# Patient Record
Sex: Male | Born: 1990 | Race: Black or African American | Hispanic: No | Marital: Single | Smoking: Never smoker
Health system: Southern US, Community
[De-identification: ages and names within clinical notes are randomized; demographics above are authoritative.]

## PROBLEM LIST (undated history)

## (undated) DIAGNOSIS — D571 Sickle-cell disease without crisis: Secondary | ICD-10-CM

## (undated) DIAGNOSIS — D649 Anemia, unspecified: Secondary | ICD-10-CM

## (undated) DIAGNOSIS — R519 Headache, unspecified: Secondary | ICD-10-CM

## (undated) HISTORY — DX: Anemia, unspecified: D64.9

## (undated) HISTORY — DX: Headache, unspecified: R51.9

## (undated) HISTORY — PX: APPENDECTOMY: SHX54

---
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
## 2011-09-19 ENCOUNTER — Encounter (HOSPITAL_COMMUNITY): Payer: Self-pay | Admitting: *Deleted

## 2011-09-19 ENCOUNTER — Emergency Department (HOSPITAL_COMMUNITY)
Admission: EM | Admit: 2011-09-19 | Discharge: 2011-09-19 | Disposition: A | Discharge status: Home / Self Care | Payer: Medicaid Other | Attending: Emergency Medicine | Admitting: Emergency Medicine

## 2011-09-19 ENCOUNTER — Emergency Department (HOSPITAL_COMMUNITY): Payer: Medicaid Other

## 2011-09-19 DIAGNOSIS — R5381 Other malaise: Secondary | ICD-10-CM | POA: Insufficient documentation

## 2011-09-19 DIAGNOSIS — D696 Thrombocytopenia, unspecified: Secondary | ICD-10-CM

## 2011-09-19 DIAGNOSIS — R55 Syncope and collapse: Secondary | ICD-10-CM

## 2011-09-19 DIAGNOSIS — R5383 Other fatigue: Secondary | ICD-10-CM | POA: Insufficient documentation

## 2011-09-19 DIAGNOSIS — I951 Orthostatic hypotension: Secondary | ICD-10-CM

## 2011-09-19 HISTORY — DX: Sickle-cell disease without crisis: D57.1

## 2011-09-19 LAB — CARDIAC PANEL(CRET KIN+CKTOT+MB+TROPI): Total CK: 32 U/L (ref 7–232)

## 2011-09-19 LAB — CBC WITH DIFFERENTIAL/PLATELET
Basophils Absolute: 0 10*3/uL (ref 0.0–0.1)
Eosinophils Absolute: 0.1 10*3/uL (ref 0.0–0.7)
Eosinophils Relative: 1 % (ref 0–5)
HCT: 30 % — ABNORMAL LOW (ref 39.0–52.0)
Lymphocytes Relative: 11 % — ABNORMAL LOW (ref 12–46)
MCH: 25.9 pg — ABNORMAL LOW (ref 26.0–34.0)
MCV: 73.3 fL — ABNORMAL LOW (ref 78.0–100.0)
Monocytes Absolute: 0.4 10*3/uL (ref 0.1–1.0)
Platelets: 80 10*3/uL — ABNORMAL LOW (ref 150–400)
RDW: 15 % (ref 11.5–15.5)
WBC: 6.4 10*3/uL (ref 4.0–10.5)

## 2011-09-19 LAB — BASIC METABOLIC PANEL
CO2: 24 mEq/L (ref 19–32)
Calcium: 8.3 mg/dL — ABNORMAL LOW (ref 8.4–10.5)
Creatinine, Ser: 0.83 mg/dL (ref 0.50–1.35)
GFR calc non Af Amer: >90 mL/min (ref >90)
Glucose, Bld: 120 mg/dL — ABNORMAL HIGH (ref 70–99)

## 2011-09-19 LAB — HEPATIC FUNCTION PANEL
ALT: 6 U/L (ref 0–53)
AST: 15 U/L (ref 0–37)
Alkaline Phosphatase: 50 U/L (ref 39–117)
Total Protein: 6.1 g/dL (ref 6.0–8.3)

## 2011-09-19 LAB — D-DIMER, QUANTITATIVE: D-Dimer, Quant: 0.33 ug/mL-FEU (ref 0.00–0.48)

## 2011-09-19 MED ORDER — SODIUM CHLORIDE 0.9 % IV BOLUS (SEPSIS)
1000.0000 mL | Freq: Once | INTRAVENOUS | Status: AC
  Administered 2011-09-19: 1000 mL via INTRAVENOUS

## 2011-09-19 NOTE — ED Notes (Signed)
Pt unable to void at this time urinal at bedside pt is aware of needed void

## 2011-09-19 NOTE — ED Provider Notes (Signed)
History     CSN: [id number]  Arrival date & time 09/19/11  0105   First MD Initiated Contact with Patient 09/19/11 0157      Chief Complaint  Patient presents with  . Loss of Consciousness    (Consider location/radiation/quality/duration/timing/severity/associated sxs/prior treatment) HPI Comments: Patient presents by EMS after sycopal episode while [place]. Patietn states he felt lighteaded, dizzy, nauseated before passing out.  Denies chest pain, SOB.  States he thinks he is dehydrated as he didn't drink much today.  Denies alcohol or drug use.  Hypotensive to 60s on EMS arrival.  Denies vision change, focal weakness, numbness, tingling.  The history is provided by the patient.    Past Medical History  Diagnosis Date  . Sickle cell anemia     History reviewed. No pertinent past surgical history.  No family history on file.  History  Substance Use Topics  . Smoking status: Never Smoker   . Smokeless tobacco: Not on file  . Alcohol Use: No      Review of Systems  Constitutional: Positive for fatigue. Negative for fever, activity change and appetite change.  HENT: Negative for congestion and rhinorrhea.   Eyes: Negative for visual disturbance.  Respiratory: Negative for cough and shortness of breath.   Cardiovascular: Positive for syncope. Negative for chest pain.  Gastrointestinal: Positive for nausea. Negative for vomiting and abdominal pain.  Genitourinary: Negative for dysuria and hematuria.  Musculoskeletal: Negative for back pain.  Skin: Negative for rash.  Neurological: Positive for dizziness, syncope and light-headedness.    Allergies  Dilaudid  Home Medications  No current outpatient prescriptions on file.  BP 107/61  Pulse 77  Temp 98.8 F (37.1 C) (Oral)  Resp 18  SpO2 100%  Physical Exam  Constitutional: He is oriented to person, place, and time. He appears well-developed and well-nourished. No distress.  HENT:    Head: Normocephalic and atraumatic.  Mouth/Throat: Oropharynx is clear and moist. No oropharyngeal exudate.  Eyes: Conjunctivae are normal. Pupils are equal, round, and reactive to light.  Neck: Normal range of motion. Neck supple.       No C spine pain, stepoff, deformity  Cardiovascular: Normal rate, regular rhythm and normal heart sounds.   No murmur heard. Pulmonary/Chest: Effort normal and breath sounds normal. No respiratory distress.  Abdominal: Soft. There is no tenderness. There is no rebound and no guarding.  Musculoskeletal: Normal range of motion. He exhibits no edema and no tenderness.  Neurological: He is alert and oriented to person, place, and time. No cranial nerve deficit.       5/5 strength throughout, CN 3-12 intact, no ataxia.  Skin: Skin is warm.    ED Course  Procedures (including critical care time)  Labs Reviewed  CBC WITH DIFFERENTIAL - Abnormal; Notable for the following:    RBC 4.09 (*)     Hemoglobin 10.6 (*)     HCT 30.0 (*)     MCV 73.3 (*)     MCH 25.9 (*)     Neutrophils Relative 80 (*)     Lymphocytes Relative 11 (*)     All other components within normal limits  BASIC METABOLIC PANEL  CARDIAC PANEL(CRET KIN+CKTOT+MB+TROPI)  D-DIMER, QUANTITATIVE  URINALYSIS, ROUTINE W REFLEX MICROSCOPIC  URINE RAPID DRUG SCREEN (HOSP PERFORMED)   Ct Head Wo Contrast  09/19/2011  *RADIOLOGY REPORT*  Clinical Data: Syncope, head trauma.  CT HEAD WITHOUT CONTRAST  Technique:  Contiguous axial images were obtained  from the base of the skull through the vertex without contrast.  Comparison: None.  Findings: There is no evidence for acute hemorrhage, hydrocephalus, mass lesion, or abnormal extra-axial fluid collection.  No definite CT evidence for acute infarction.  The visualized paranasal sinuses and mastoid air cells are predominately clear.  No displaced calvarial fracture.  IMPRESSION: No acute intracranial abnormality.  Original Report Authenticated By: Waneta Martins, M.D.     No diagnosis found.    MDM  Sycnopal episode while standing outside hitting back of head. Hypotensive on EMS arrival. Admits to poor by mouth intake today. No focal neurological deficits.  Hemoglobin 10.6 from 12. Platelets of 80. Previously 117. Patient refuses rectal exam. Denies any blood in the stool, bleeding, rashes. Denies any alcohol use.  Questionable history of sickle cell anemia. Orthostatic vitals positive. D-dimer negative. Tolerating PO in the ED, ambulatory without assistance. Patient does state he has a history of sickle cell anemia. He sees a Acupuncturist in [place]. He denies having a previous platelet problems. Denies any bleeding or bruising. Advised to followup regarding his thrombocytopenia.  Given hematology referral here as well.    Date: 09/19/2011  Rate: 60  Rhythm: sinus arrhythmia  QRS Axis: normal  Intervals: normal  ST/T Wave abnormalities: normal  Conduction Disutrbances:none  Narrative Interpretation: No Brugada, no prolonged QT  Old EKG Reviewed: none available    Glynn Octave, MD 09/19/11 [phone]

## 2011-09-19 NOTE — ED Notes (Signed)
The pt still has not given a urine spec

## 2011-09-19 NOTE — ED Notes (Signed)
[place]  [place] the pt fainted and struck the back of his head.  The ems said his initial bp was 60 pal.  Iv started and nss infused.  Pt alert on arrival and he does not klnow what his usual bp is normally.  Iv per gems

## 2011-09-19 NOTE — ED Notes (Signed)
i asked the pt for a urine pt did not answer

## 2011-09-19 NOTE — ED Notes (Signed)
1000cc nss infused. 1000 cc nss added to iv.  The pt has no complaints

## 2011-09-19 NOTE — ED Notes (Signed)
The pt is alert and oriented not saying very much.  He has had similar episodes in the past he thinks are due to his sickle cell

## 2011-09-19 NOTE — ED Notes (Signed)
Pt reminded of the  Urine  Spec we needed

## 2011-10-14 DIAGNOSIS — R161 Splenomegaly, not elsewhere classified: Secondary | ICD-10-CM | POA: Insufficient documentation

## 2011-10-14 DIAGNOSIS — D564 Hereditary persistence of fetal hemoglobin [HPFH]: Secondary | ICD-10-CM | POA: Insufficient documentation

## 2011-10-14 DIAGNOSIS — G8929 Other chronic pain: Secondary | ICD-10-CM | POA: Insufficient documentation

## 2011-10-14 DIAGNOSIS — K8020 Calculus of gallbladder without cholecystitis without obstruction: Secondary | ICD-10-CM | POA: Insufficient documentation

## 2011-10-14 DIAGNOSIS — D571 Sickle-cell disease without crisis: Secondary | ICD-10-CM | POA: Insufficient documentation

## 2011-11-02 DIAGNOSIS — E559 Vitamin D deficiency, unspecified: Secondary | ICD-10-CM | POA: Insufficient documentation

## 2011-11-02 HISTORY — DX: Vitamin D deficiency, unspecified: E55.9

## 2012-03-25 ENCOUNTER — Emergency Department (HOSPITAL_COMMUNITY)
Admission: EM | Admit: 2012-03-25 | Discharge: 2012-03-25 | Disposition: A | Discharge status: Home / Self Care | Payer: Medicaid Other | Attending: Emergency Medicine | Admitting: Emergency Medicine

## 2012-03-25 ENCOUNTER — Encounter (HOSPITAL_COMMUNITY): Payer: Self-pay | Admitting: Emergency Medicine

## 2012-03-25 DIAGNOSIS — M545 Low back pain, unspecified: Secondary | ICD-10-CM | POA: Insufficient documentation

## 2012-03-25 DIAGNOSIS — D5700 Hb-SS disease with crisis, unspecified: Secondary | ICD-10-CM | POA: Insufficient documentation

## 2012-03-25 DIAGNOSIS — IMO0001 Reserved for inherently not codable concepts without codable children: Secondary | ICD-10-CM | POA: Insufficient documentation

## 2012-03-25 DIAGNOSIS — Z79899 Other long term (current) drug therapy: Secondary | ICD-10-CM | POA: Insufficient documentation

## 2012-03-25 LAB — RAPID URINE DRUG SCREEN, HOSP PERFORMED
Opiates: NOT DETECTED
Tetrahydrocannabinol: POSITIVE — AB

## 2012-03-25 LAB — CBC WITH DIFFERENTIAL/PLATELET
Basophils Relative: 0 % (ref 0–1)
Eosinophils Absolute: 0.1 10*3/uL (ref 0.0–0.7)
Eosinophils Relative: 2 % (ref 0–5)
HCT: 36.2 % — ABNORMAL LOW (ref 39.0–52.0)
Hemoglobin: 12.7 g/dL — ABNORMAL LOW (ref 13.0–17.0)
MCH: 25.7 pg — ABNORMAL LOW (ref 26.0–34.0)
MCHC: 35.1 g/dL (ref 30.0–36.0)
MCV: 73.1 fL — ABNORMAL LOW (ref 78.0–100.0)
Monocytes Absolute: 0.5 10*3/uL (ref 0.1–1.0)
Monocytes Relative: 9 % (ref 3–12)

## 2012-03-25 LAB — COMPREHENSIVE METABOLIC PANEL
Alkaline Phosphatase: 60 U/L (ref 39–117)
BUN: 4 mg/dL — ABNORMAL LOW (ref 6–23)
Calcium: 9 mg/dL (ref 8.4–10.5)
Creatinine, Ser: 0.88 mg/dL (ref 0.50–1.35)
GFR calc Af Amer: >90 mL/min (ref >90)
Glucose, Bld: 148 mg/dL — ABNORMAL HIGH (ref 70–99)
Potassium: 3.4 mEq/L — ABNORMAL LOW (ref 3.5–5.1)
Total Protein: 7.1 g/dL (ref 6.0–8.3)

## 2012-03-25 LAB — RETICULOCYTES: Retic Ct Pct: 4.1 % — ABNORMAL HIGH (ref 0.4–3.1)

## 2012-03-25 MED ORDER — MORPHINE SULFATE 4 MG/ML IJ SOLN
8.0000 mg | Freq: Once | INTRAMUSCULAR | Status: AC
  Administered 2012-03-25: 8 mg via INTRAVENOUS
  Filled 2012-03-25: qty 2

## 2012-03-25 MED ORDER — KETOROLAC TROMETHAMINE 30 MG/ML IJ SOLN
30.0000 mg | Freq: Once | INTRAMUSCULAR | Status: AC
  Administered 2012-03-25: 30 mg via INTRAVENOUS
  Filled 2012-03-25: qty 1

## 2012-03-25 MED ORDER — SODIUM CHLORIDE 0.9 % IV BOLUS (SEPSIS)
1000.0000 mL | Freq: Once | INTRAVENOUS | Status: AC
  Administered 2012-03-25: 1000 mL via INTRAVENOUS

## 2012-03-25 MED ORDER — ONDANSETRON HCL 4 MG/2ML IJ SOLN
4.0000 mg | Freq: Once | INTRAMUSCULAR | Status: AC
  Administered 2012-03-25: 4 mg via INTRAVENOUS
  Filled 2012-03-25: qty 2

## 2012-03-25 NOTE — ED Provider Notes (Signed)
History     CSN: [id number]  Arrival date & time 03/25/12  1657   First MD Initiated Contact with Patient 03/25/12 1708      Chief Complaint  Patient presents with  . Sickle Cell Pain Crisis    (Consider location/radiation/quality/duration/timing/severity/associated sxs/prior treatment) HPI Comments: Patient complains of sickle cell crisis in his lower back and legs it started several hours ago. He took his normal oxycodone without relief. States pain is similar to previous episodes of sickle cell pain. He fell at [facility]. Denies any fevers, chills, cough, congestion, chest pain or shortness of breath. Denies a difficulty breathing or swallowing. No numbness, tingling, weakness, bowel or bladder incontinence.  The history is provided by the patient.    Past Medical History  Diagnosis Date  . Sickle cell anemia     History reviewed. No pertinent past surgical history.  No family history on file.  History  Substance Use Topics  . Smoking status: Never Smoker   . Smokeless tobacco: Not on file  . Alcohol Use: No      Review of Systems  Constitutional: Negative for fever, activity change and appetite change.  HENT: Negative for congestion and rhinorrhea.   Respiratory: Negative for cough, chest tightness and shortness of breath.   Cardiovascular: Negative for chest pain.  Gastrointestinal: Negative for nausea, vomiting and abdominal pain.  Genitourinary: Negative for dysuria and hematuria.  Musculoskeletal: Positive for myalgias, back pain and arthralgias.  Skin: Negative for rash.  Neurological: Negative for dizziness, weakness and headaches.    Allergies  Dilaudid  Home Medications   Current Outpatient Rx  Name  Route  Sig  Dispense  Refill  . OXYCODONE HCL 15 MG PO TABS   Oral   Take 15 mg by mouth every 4 (four) hours as needed. For pain           BP 113/65  Pulse 51  Temp 97.5 F (36.4 C) (Oral)  Resp 16  SpO2 99%  Physical Exam   Constitutional: He is oriented to person, place, and time. He appears well-developed and well-nourished. No distress.       Smells of marijuana  HENT:  Head: Normocephalic and atraumatic.  Mouth/Throat: Oropharynx is clear and moist. No oropharyngeal exudate.  Eyes: Conjunctivae normal and EOM are normal. Pupils are equal, round, and reactive to light.  Neck: Normal range of motion. Neck supple.  Cardiovascular: Normal rate, regular rhythm and normal heart sounds.   No murmur heard. Pulmonary/Chest: Effort normal and breath sounds normal. No respiratory distress.  Abdominal: Soft. There is no tenderness. There is no rebound and no guarding.  Musculoskeletal: Normal range of motion. He exhibits no edema and no tenderness.       Paraspinal lumbar pain bilatrally  5/5 strength in bilateral lower extremities. Ankle plantar and dorsiflexion intact. Great toe extension intact bilaterally. +2 DP and PT pulses. +2 patellar reflexes bilaterally. Normal gait.   Neurological: He is alert and oriented to person, place, and time. No cranial nerve deficit. He exhibits normal muscle tone. Coordination normal.  Skin: Skin is warm.    ED Course  Procedures (including critical care time)  Labs Reviewed  CBC WITH DIFFERENTIAL - Abnormal; Notable for the following:    Hemoglobin 12.7 (*)     HCT 36.2 (*)     MCV 73.1 (*)     MCH 25.7 (*)     Platelets 113 (*)     All other components within normal limits  COMPREHENSIVE  METABOLIC PANEL - Abnormal; Notable for the following:    Potassium 3.4 (*)     Glucose, Bld 148 (*)     BUN 4 (*)     Total Bilirubin 1.7 (*)     All other components within normal limits  RETICULOCYTES - Abnormal; Notable for the following:    Retic Ct Pct 4.1 (*)     Retic Count, Manual 203.0 (*)     All other components within normal limits  URINE RAPID DRUG SCREEN (HOSP PERFORMED) - Abnormal; Notable for the following:    Tetrahydrocannabinol POSITIVE (*)     All other  components within normal limits   No results found.   1. Sickle cell pain crisis       MDM  Typical sickle cell pain crisis in back and legs. No fevers or vomiting. No chest pain or shortness of breath.  Reviewed [facility] records.    Hemoglobin stable. Thrombocytopenia improved. Reticulocyte response adequate.  Pain improved with one dose of morphine. Patient requesting discharge.    Glynn Octave, MD 03/25/12 1950

## 2012-03-25 NOTE — ED Notes (Signed)
Pt. Stated, I'm having a sickle cell crisis in my lower back and legs.

## 2012-03-25 NOTE — ED Notes (Signed)
Pt states understanding of discharge instructions 

## 2012-05-05 ENCOUNTER — Encounter (HOSPITAL_COMMUNITY): Payer: Self-pay | Admitting: Emergency Medicine

## 2012-05-05 ENCOUNTER — Emergency Department (HOSPITAL_COMMUNITY)
Admission: EM | Admit: 2012-05-05 | Discharge: 2012-05-05 | Payer: Medicaid Other | Attending: Emergency Medicine | Admitting: Emergency Medicine

## 2012-05-05 DIAGNOSIS — M542 Cervicalgia: Secondary | ICD-10-CM | POA: Insufficient documentation

## 2012-05-05 DIAGNOSIS — M549 Dorsalgia, unspecified: Secondary | ICD-10-CM | POA: Insufficient documentation

## 2012-05-05 DIAGNOSIS — D5700 Hb-SS disease with crisis, unspecified: Secondary | ICD-10-CM

## 2012-05-05 DIAGNOSIS — IMO0001 Reserved for inherently not codable concepts without codable children: Secondary | ICD-10-CM | POA: Insufficient documentation

## 2012-05-05 LAB — CBC WITH DIFFERENTIAL/PLATELET
Basophils Relative: 0 % (ref 0–1)
Eosinophils Relative: 3 % (ref 0–5)
Hemoglobin: 10.2 g/dL — ABNORMAL LOW (ref 13.0–17.0)
Lymphocytes Relative: 20 % (ref 12–46)
MCH: 25.1 pg — ABNORMAL LOW (ref 26.0–34.0)
Monocytes Absolute: 1.8 10*3/uL — ABNORMAL HIGH (ref 0.1–1.0)
Neutrophils Relative %: 62 % (ref 43–77)
RBC: 4.06 MIL/uL — ABNORMAL LOW (ref 4.22–5.81)

## 2012-05-05 LAB — COMPREHENSIVE METABOLIC PANEL
ALT: 35 U/L (ref 0–53)
AST: 47 U/L — ABNORMAL HIGH (ref 0–37)
CO2: 28 mEq/L (ref 19–32)
Chloride: 99 mEq/L (ref 96–112)
GFR calc non Af Amer: >90 mL/min (ref >90)
Potassium: 4.6 mEq/L (ref 3.5–5.1)
Sodium: 137 mEq/L (ref 135–145)
Total Bilirubin: 2.6 mg/dL — ABNORMAL HIGH (ref 0.3–1.2)

## 2012-05-05 MED ORDER — SODIUM CHLORIDE 0.9 % IV BOLUS (SEPSIS)
1000.0000 mL | Freq: Once | INTRAVENOUS | Status: AC
  Administered 2012-05-05: 1000 mL via INTRAVENOUS

## 2012-05-05 MED ORDER — KETOROLAC TROMETHAMINE 30 MG/ML IJ SOLN
30.0000 mg | Freq: Once | INTRAMUSCULAR | Status: AC
  Administered 2012-05-05: 30 mg via INTRAVENOUS
  Filled 2012-05-05: qty 1

## 2012-05-05 MED ORDER — MORPHINE SULFATE 4 MG/ML IJ SOLN
4.0000 mg | Freq: Once | INTRAMUSCULAR | Status: AC
  Administered 2012-05-05: 4 mg via INTRAVENOUS
  Filled 2012-05-05: qty 1

## 2012-05-05 MED ORDER — OXYCODONE-ACETAMINOPHEN 5-325 MG PO TABS: 1.0000 | ORAL_TABLET | ORAL | Status: DC | PRN

## 2012-05-05 MED ORDER — IBUPROFEN 600 MG PO TABS: 600.0000 mg | ORAL_TABLET | Freq: Four times a day (QID) | ORAL | Status: DC | PRN

## 2012-05-05 MED ORDER — OXYCODONE-ACETAMINOPHEN 5-325 MG PO TABS
2.0000 | ORAL_TABLET | Freq: Once | ORAL | Status: AC
  Administered 2012-05-05: 2 via ORAL
  Filled 2012-05-05: qty 2

## 2012-05-05 NOTE — ED Notes (Signed)
Patient discharged to jail.  Waiting in room for another unit to pick them up.  Detention officer at bedside wanting to remain in room with patient until unit comes.

## 2012-05-05 NOTE — ED Notes (Signed)
Pt presenting to ed with c/o sickle cell crisis with neck and back pain onset x 4 days

## 2012-05-05 NOTE — ED Provider Notes (Signed)
History     CSN: [id number]  Arrival date & time 05/05/12  1633   First MD Initiated Contact with Patient 05/05/12 1634      Chief Complaint  Patient presents with  . Sickle Cell Pain Crisis    (Consider location/radiation/quality/duration/timing/severity/associated sxs/prior treatment) HPI Pt with 4 days of neck and back pain similar to previous sickle cell pain crises. Pt is currently incarcerated and unable to take normal pain medicine, oxycodone, for pain. No fever chills, SOB, cough, CP abd pain, N/V, focal weakness. No trauma.  Past Medical History  Diagnosis Date  . Sickle cell anemia     Past Surgical History  Procedure Laterality Date  . Appendectomy      No family history on file.  History  Substance Use Topics  . Smoking status: Never Smoker   . Smokeless tobacco: Not on file  . Alcohol Use: No      Review of Systems  Constitutional: Negative for fever and chills.  HENT: Positive for neck pain. Negative for neck stiffness.   Respiratory: Negative for cough and shortness of breath.   Cardiovascular: Negative for chest pain.  Gastrointestinal: Negative for nausea, vomiting and abdominal pain.  Genitourinary: Negative for dysuria.  Musculoskeletal: Positive for myalgias and back pain.  Skin: Negative for rash and wound.  Neurological: Negative for dizziness, weakness, numbness and headaches.  All other systems reviewed and are negative.    Allergies  Dilaudid and Cefotaxime  Home Medications   Current Outpatient Rx  Name  Route  Sig  Dispense  Refill  . ibuprofen (ADVIL,MOTRIN) 600 MG tablet   Oral   Take 1 tablet (600 mg total) by mouth every 6 (six) hours as needed for pain.   30 tablet   0   . oxyCODONE-acetaminophen (PERCOCET/ROXICET) 5-325 MG per tablet   Oral   Take 1 tablet by mouth every 4 (four) hours as needed for pain.   15 tablet   0     BP 115/69  Pulse 66  Temp(Src) 98.9 F (37.2 C) (Oral)  Resp 16  SpO2  100%  Physical Exam  Nursing note and vitals reviewed. Constitutional: He is oriented to person, place, and time. He appears well-developed and well-nourished. No distress.  HENT:  Head: Normocephalic and atraumatic.  Mouth/Throat: Oropharynx is clear and moist.  Eyes: EOM are normal. Pupils are equal, round, and reactive to light.  Neck: Normal range of motion. Neck supple.  Cardiovascular: Normal rate and regular rhythm.   Pulmonary/Chest: Effort normal and breath sounds normal. No respiratory distress. He has no wheezes. He has no rales.  Abdominal: Soft. Bowel sounds are normal. He exhibits no distension and no mass. There is no tenderness. There is no rebound and no guarding.  Musculoskeletal: Normal range of motion. He exhibits tenderness (Diffuse posterior cervical, thoracic and lumbar tenderness without focality. No evidence of trauma. No stepoffs ). He exhibits no edema.  Neurological: He is alert and oriented to person, place, and time.  Moves all ext without deficit. Sensation intact  Skin: Skin is warm and dry. No rash noted. No erythema.  Psychiatric: He has a normal mood and affect. His behavior is normal.    ED Course  Procedures (including critical care time)  Labs Reviewed  CBC WITH DIFFERENTIAL - Abnormal; Notable for the following:    WBC 12.0 (*)    RBC 4.06 (*)    Hemoglobin 10.2 (*)    HCT 29.5 (*)    MCV 72.7 (*)  MCH 25.1 (*)    RDW 15.7 (*)    Monocytes Relative 15 (*)    Monocytes Absolute 1.8 (*)    All other components within normal limits  COMPREHENSIVE METABOLIC PANEL - Abnormal; Notable for the following:    AST 47 (*)    Total Bilirubin 2.6 (*)    All other components within normal limits  RETICULOCYTES - Abnormal; Notable for the following:    Retic Ct Pct 10.2 (*)    RBC. 4.06 (*)    Retic Count, Manual 414.1 (*)    All other components within normal limits   No results found.   1. Sickle cell crisis       MDM  Pt states he is  feeling better after meds. No obvious distress. Will d/c into police custody.         Loren Racer, MD 05/05/12 2059

## 2012-05-06 ENCOUNTER — Emergency Department (HOSPITAL_COMMUNITY): Payer: Medicaid Other

## 2012-05-06 ENCOUNTER — Encounter (HOSPITAL_COMMUNITY): Payer: Self-pay | Admitting: Emergency Medicine

## 2012-05-06 ENCOUNTER — Inpatient Hospital Stay (HOSPITAL_COMMUNITY)
Admission: EM | Admit: 2012-05-06 | Discharge: 2012-05-12 | DRG: 811 | Disposition: A | Discharge status: Home / Self Care | Payer: Medicaid Other | Attending: Internal Medicine | Admitting: Internal Medicine

## 2012-05-06 DIAGNOSIS — D696 Thrombocytopenia, unspecified: Secondary | ICD-10-CM | POA: Diagnosis present

## 2012-05-06 DIAGNOSIS — D72829 Elevated white blood cell count, unspecified: Secondary | ICD-10-CM

## 2012-05-06 DIAGNOSIS — Z888 Allergy status to other drugs, medicaments and biological substances status: Secondary | ICD-10-CM

## 2012-05-06 DIAGNOSIS — Z9089 Acquired absence of other organs: Secondary | ICD-10-CM

## 2012-05-06 DIAGNOSIS — R509 Fever, unspecified: Secondary | ICD-10-CM

## 2012-05-06 DIAGNOSIS — Z791 Long term (current) use of non-steroidal anti-inflammatories (NSAID): Secondary | ICD-10-CM

## 2012-05-06 DIAGNOSIS — D5700 Hb-SS disease with crisis, unspecified: Principal | ICD-10-CM | POA: Diagnosis present

## 2012-05-06 DIAGNOSIS — Z885 Allergy status to narcotic agent status: Secondary | ICD-10-CM

## 2012-05-06 DIAGNOSIS — J189 Pneumonia, unspecified organism: Secondary | ICD-10-CM | POA: Diagnosis present

## 2012-05-06 DIAGNOSIS — Z79899 Other long term (current) drug therapy: Secondary | ICD-10-CM

## 2012-05-06 DIAGNOSIS — R079 Chest pain, unspecified: Secondary | ICD-10-CM | POA: Diagnosis present

## 2012-05-06 LAB — POCT I-STAT, CHEM 8
BUN: 6 mg/dL (ref 6–23)
Calcium, Ion: 1.13 mmol/L (ref 1.12–1.23)
Chloride: 106 mEq/L (ref 96–112)
Potassium: 4.3 mEq/L (ref 3.5–5.1)

## 2012-05-06 LAB — CBC WITH DIFFERENTIAL/PLATELET
Basophils Absolute: 0.1 10*3/uL (ref 0.0–0.1)
HCT: 29.2 % — ABNORMAL LOW (ref 39.0–52.0)
Hemoglobin: 10.1 g/dL — ABNORMAL LOW (ref 13.0–17.0)
Lymphocytes Relative: 8 % — ABNORMAL LOW (ref 12–46)
Monocytes Absolute: 2.6 10*3/uL — ABNORMAL HIGH (ref 0.1–1.0)
Monocytes Relative: 14 % — ABNORMAL HIGH (ref 3–12)
Neutro Abs: 14.9 10*3/uL — ABNORMAL HIGH (ref 1.7–7.7)
RDW: 17.3 % — ABNORMAL HIGH (ref 11.5–15.5)
WBC: 19.1 10*3/uL — ABNORMAL HIGH (ref 4.0–10.5)

## 2012-05-06 LAB — POCT I-STAT TROPONIN I: Troponin i, poc: 0 ng/mL (ref 0.00–0.08)

## 2012-05-06 LAB — RETICULOCYTES: Retic Count, Absolute: 478.4 10*3/uL — ABNORMAL HIGH (ref 19.0–186.0)

## 2012-05-06 MED ORDER — ONDANSETRON HCL 4 MG/2ML IJ SOLN
4.0000 mg | Freq: Once | INTRAMUSCULAR | Status: AC
  Administered 2012-05-06: 4 mg via INTRAVENOUS
  Filled 2012-05-06: qty 2

## 2012-05-06 MED ORDER — MORPHINE SULFATE 4 MG/ML IJ SOLN
6.0000 mg | Freq: Once | INTRAMUSCULAR | Status: AC
  Administered 2012-05-06: 6 mg via INTRAVENOUS
  Filled 2012-05-06 (×2): qty 1

## 2012-05-06 MED ORDER — LIDOCAINE HCL 4 % EX SOLN
Freq: Once | CUTANEOUS | Status: DC
  Filled 2012-05-06: qty 50

## 2012-05-06 MED ORDER — MORPHINE SULFATE 4 MG/ML IJ SOLN
4.0000 mg | INTRAMUSCULAR | Status: DC | PRN
  Administered 2012-05-06 – 2012-05-07 (×5): 4 mg via INTRAVENOUS
  Filled 2012-05-06: qty 1

## 2012-05-06 MED ORDER — SODIUM CHLORIDE 0.9 % IV BOLUS (SEPSIS)
1000.0000 mL | Freq: Once | INTRAVENOUS | Status: AC
  Administered 2012-05-06: 1000 mL via INTRAVENOUS

## 2012-05-06 MED ORDER — MORPHINE SULFATE 4 MG/ML IJ SOLN
INTRAMUSCULAR | Status: AC
  Filled 2012-05-06: qty 1

## 2012-05-06 MED ORDER — MORPHINE SULFATE 4 MG/ML IJ SOLN
8.0000 mg | Freq: Once | INTRAMUSCULAR | Status: AC
  Administered 2012-05-06: 8 mg via INTRAVENOUS
  Filled 2012-05-06: qty 2

## 2012-05-06 NOTE — ED Provider Notes (Signed)
History     CSN: [id number]  Arrival date & time 05/06/12  1658   First MD Initiated Contact with Patient 05/06/12 1822      Chief Complaint  Patient presents with  . Chest Pain  . Shortness of Breath    (Consider location/radiation/quality/duration/timing/severity/associated sxs/prior treatment) HPI This 22 year old male has sickle cell disease and chronic pain chronic narcotic usage has been in jail for the last week and unable to take his narcotic pain medicine, he was taken to [facility] yesterday for a sickle cell pain crisis improved and taken back to jail, he was released from jail today, did not fill his prescription for pain medicine from [facility] long hospital, and he came to the [facility]  to the emergency department for pain relief, he has several days of typical severe constant 24/7 sickle cell back pain flareup pain as well as today all day long chest pain over 8 hours like he has had in the past with prior sickle cell exacerbations, he also has some baseline severe neck pain, he has no cough no shortness of breath no fever no confusion no abdominal pain but he did vomit once today, he does have typical right leg pain like he has had with prior sickle cell exacerbations as well, he is no new focal weakness or numbness and states he has been drinking fluids well but still feels a bit dehydrated and requests IV fluids and morphine and nausea medicine because that helps typically for him when he has to go to the emergency department at [facility] where his doctors are. Past Medical History  Diagnosis Date  . Sickle cell anemia   . Sickle cell anemia     Past Surgical History  Procedure Laterality Date  . Appendectomy      Family History  Problem Relation Age of Onset  . Lung cancer Mother     History  Substance Use Topics  . Smoking status: Never Smoker   . Smokeless tobacco: Not on file  . Alcohol Use: No      Review of Systems 10 Systems  reviewed and are negative for acute change except as noted in the HPI. Allergies  Dilaudid; Hydromorphone; and Cefotaxime  Home Medications   No current outpatient prescriptions on file.  BP 135/81  Pulse 93  Temp(Src) 98.8 F (37.1 C) (Oral)  Resp 22  Ht 6\' 1"  (1.854 m)  Wt 172 lb 11.2 oz (78.336 kg)  BMI 22.79 kg/m2  SpO2 90%  Physical Exam  Nursing note and vitals reviewed. Constitutional:  Awake, alert, nontoxic appearance.  HENT:  Head: Atraumatic.  Eyes: Right eye exhibits no discharge. Left eye exhibits no discharge.  Neck: Neck supple.  Cardiovascular: Normal rate and regular rhythm.   No murmur heard. Pulmonary/Chest: Effort normal and breath sounds normal. No respiratory distress. He has no wheezes. He has no rales. He exhibits tenderness.  Abdominal: Soft. There is no tenderness. There is no rebound.  Musculoskeletal: He exhibits tenderness. He exhibits no edema.  Baseline ROM, no obvious new focal weakness. Diffusely tender posterior neck and back as well as anterior chest wall like the patient states he has had in the past multiple times with sickle cell exacerbations.  Neurological: He is alert.  Mental status and motor strength appears baseline for patient and situation.  Skin: No rash noted.  Psychiatric: He has a normal mood and affect.    ED Course  Procedures (including critical care time) Patient's pain no better  in the ED he wants admission he requested transfer to [facility] I discussed the case with hematology oncology at [facility] feel the patient can stay at [facility] on observation the patient understands and agrees with this assessment and plan as well I talked to the hospitalist here at [facility] will admit the patient.  Labs Reviewed  RETICULOCYTES - Abnormal; Notable for the following:    Retic Ct Pct 11.9 (*)    RBC. 4.02 (*)    Retic Count, Manual 478.4 (*)    All other components within normal limits  CBC WITH DIFFERENTIAL - Abnormal;  Notable for the following:    WBC 19.1 (*)    RBC 4.02 (*)    Hemoglobin 10.1 (*)    HCT 29.2 (*)    MCV 72.6 (*)    MCH 25.1 (*)    RDW 17.3 (*)    Platelets 136 (*)    Neutrophils Relative 78 (*)    Neutro Abs 14.9 (*)    Lymphocytes Relative 8 (*)    Monocytes Relative 14 (*)    Monocytes Absolute 2.6 (*)    All other components within normal limits  POCT I-STAT, CHEM 8 - Abnormal; Notable for the following:    Glucose, Bld 118 (*)    Hemoglobin 10.9 (*)    HCT 32.0 (*)    All other components within normal limits  MRSA PCR SCREENING  BASIC METABOLIC PANEL  MAGNESIUM  CBC  FERRITIN  POCT I-STAT TROPONIN I   Dg Chest 2 View  05/06/2012  *RADIOLOGY REPORT*  Clinical Data: Shortness of breath and chest pain for 2 days. Nonsmoker.  CHEST - 2 VIEW  Comparison: 04/05/2010  Findings: Heart size is normal.  Lung volumes are low.  There is perihilar peribronchial change.  Bibasilar atelectasis is present. There are no focal consolidations or pleural effusions.  IMPRESSION:  1.  Low lung volumes. 2.  Bronchitic changes.   Original Report Authenticated By: Norva Pavlov, M.D.      1. Sickle cell pain crisis       MDM  The patient appears reasonably stabilized for admission considering the current resources, flow, and capabilities available in the ED at this time, and I doubt any other [facility] requiring further screening and/or treatment in the ED prior to admission.        Hurman Horn, MD 05/07/12 [phone]

## 2012-05-06 NOTE — ED Notes (Signed)
The pt still having pain.  Asking for juice unable to get at present

## 2012-05-06 NOTE — ED Notes (Signed)
Pt reports pain across chest onset yesterday. Pt also reports back pain. Symptoms accompanied by shortness of breath and nausea. Pt seen [facility] yesterday for same.

## 2012-05-06 NOTE — ED Notes (Signed)
Admitting  Doctor in with the pt

## 2012-05-06 NOTE — ED Notes (Signed)
The pt has had chest and pain all over for the past 2 days.  He has sickle cell. He was in jail and was seen at [facility] long ed last pm for the same.  He was released from jail  Today and came here for more pain management.

## 2012-05-06 NOTE — ED Notes (Signed)
The pt received pain med iv

## 2012-05-06 NOTE — H&P (Signed)
PCP: Hematology at [facility] Complaint:  Back and chest pain  HPI: Paul Campos is a 22 y.o. male   has a past medical history of Sickle cell anemia and Sickle cell anemia.   Presented with  1 week hx of back pain and Right leg pain typical for his crisis. He has hx of Sickle cell disease. He is followed by clinic at [facility]. States he takes oxycodone 15 mg as needed. A week ago he was incarcerated and soon there after started to have back pain. He was treated with motrin and Tylenol #3 and yesterday he went to [facility] and had a prescription given for percocet but have not filled it yet. Today he was released from [facility] and went straight to ER. Patient does report transient chest pain that have went away but lasted all day long. CXR unremarkable.  He have had some low grade fevers and some nausea. Patient is being admitted for pain management.   Review of Systems:     Pertinent positives include: Fevers, chills  chest pain, back pain  Constitutional:   No weight loss, night sweats,, fatigue, weight loss  HEENT:  No headaches, Difficulty swallowing,Tooth/dental problems,Sore throat,  No sneezing, itching, ear ache, nasal congestion, post nasal drip,  Cardio-vascular:  No Orthopnea, PND, anasarca, dizziness, palpitations.no Bilateral lower extremity swelling  GI:  No heartburn, indigestion, abdominal pain, nausea, vomiting, diarrhea, change in bowel habits, loss of appetite, melena, blood in stool, hematemesis Resp:  no shortness of breath at rest. No dyspnea on exertion, No excess mucus, no productive cough, No non-productive cough, No coughing up of blood.No change in color of mucus.No wheezing. Skin:  no rash or lesions. No jaundice GU:  no dysuria, change in color of urine, no urgency or frequency. No straining to urinate.  No flank pain.  Musculoskeletal:  No joint pain or no joint swelling. No decreased range of motion. No back pain.  Psych:  No change in mood or affect. No  depression or anxiety. No memory loss.  Neuro: no localizing neurological complaints, no tingling, no weakness, no double vision, no gait abnormality, no slurred speech, no confusion  Otherwise ROS are negative except for above, 10 systems were reviewed  Past Medical History: Past Medical History  Diagnosis Date  . Sickle cell anemia   . Sickle cell anemia    Past Surgical History  Procedure Laterality Date  . Appendectomy       Medications: Prior to Admission medications   Medication Sig Start Date End Date Taking? Authorizing Provider  acetaminophen-codeine (TYLENOL #3) 300-30 MG per tablet Take 1 tablet by mouth every 4 (four) hours as needed for pain.   Yes Historical Provider, MD  ibuprofen (ADVIL,MOTRIN) 200 MG tablet Take 200 mg by mouth every 6 (six) hours as needed for pain.   Yes Historical Provider, MD  oxyCODONE-acetaminophen (PERCOCET/ROXICET) 5-325 MG per tablet Take 1 tablet by mouth every 4 (four) hours as needed for pain. 05/05/12   Loren Racer, MD    Allergies:   Allergies  Allergen Reactions  . Dilaudid (Hydromorphone Hcl) Hives  . Hydromorphone Itching    Morphine okay.  . Cefotaxime Itching and Rash    Hives    Social History:  Ambulatory  independently   Lives at  home   reports that he has never smoked. He does not have any smokeless tobacco history on file. He reports that he does not drink alcohol or use illicit drugs.   Family History:  family history includes Lung cancer in his mother.    Physical Exam: Patient Vitals for the past 24 hrs:  BP Temp Temp src Pulse Resp SpO2  05/06/12 2023 141/85 mmHg - - 83 20 97 %  05/06/12 1937 133/81 mmHg 99.1 F (37.3 C) Oral 80 16 97 %    1. General:  in No Acute distress 2. Psychological: Alert and  Oriented 3. Head/ENT:   Moist   Mucous Membranes                          Head Non traumatic, neck supple                          Normal   Dentition 4. SKIN: normal  Skin turgor,  Skin clean Dry  and intact no rash 5. Heart: Regular rate and rhythm no Murmur, Rub or gallop 6. Lungs: Clear to auscultation bilaterally, no wheezes or crackles   7. Abdomen: Soft, non-tender, Non distended 8. Lower extremities: no clubbing, cyanosis, or edema 9. Neurologically Grossly intact, moving all 4 extremities equally 10. MSK: Normal range of motion  body mass index is unknown because there is no height or weight on file.   Labs on Admission:   Recent Labs  05/05/12 1730 05/06/12 1741  NA 137 141  K 4.6 4.3  CL 99 106  CO2 28  --   GLUCOSE 83 118*  BUN 10 6  CREATININE 0.92 0.70  CALCIUM 9.7  --     Recent Labs  05/05/12 1730  AST 47*  ALT 35  ALKPHOS 69  BILITOT 2.6*  PROT 8.2  ALBUMIN 4.8   No results found for this basename: LIPASE, AMYLASE,  in the last 72 hours  Recent Labs  05/05/12 1730 05/06/12 1741 05/06/12 1820  WBC 12.0*  --  19.1*  NEUTROABS 7.4  --  14.9*  HGB 10.2* 10.9* 10.1*  HCT 29.5* 32.0* 29.2*  MCV 72.7*  --  72.6*  PLT PLATELET CLUMPS NOTED ON SMEAR, COUNT APPEARS DECREASED  --  136*   No results found for this basename: CKTOTAL, CKMB, CKMBINDEX, TROPONINI,  in the last 72 hours No results found for this basename: TSH, T4TOTAL, FREET3, T3FREE, THYROIDAB,  in the last 72 hours  Recent Labs  05/05/12 1730 05/06/12 1820  RETICCTPCT 10.2* 11.9*   No results found for this basename: HGBA1C    CrCl is unknown because there is no height on file for the current visit. ABG    Component Value Date/Time   TCO2 25 05/06/2012 1741     Lab Results  Component Value Date   DDIMER 0.33 09/19/2011     Other results:  I have pearsonaly reviewed this: ECG REPORT  Rate:78  Rhythm:NSR ST&T Change:  t wave inversions in lead iii, aVF    Cultures: No results found for this basename: sdes, specrequest, cult, reptstatus       Radiological Exams on Admission: Dg Chest 2 View  05/06/2012  *RADIOLOGY REPORT*  Clinical Data: Shortness of breath  and chest pain for 2 days. Nonsmoker.  CHEST - 2 VIEW  Comparison: 04/05/2010  Findings: Heart size is normal.  Lung volumes are low.  There is perihilar peribronchial change.  Bibasilar atelectasis is present. There are no focal consolidations or pleural effusions.  IMPRESSION:  1.  Low lung volumes. 2.  Bronchitic changes.   Original Report Authenticated By: Norva Pavlov,  M.D.     Chart has been reviewed  Assessment/Plan  22 yo M w self reported hx of sickle cell disease here with pain he states is typical for his crisis.   Present on Admission:  . Sickle cell pain crisis - admit for observation, control pain and monitor, will try to obtain records from [facility].  . Chest pain - no evidence of acute chest syndrome pain is non-pleuritic. Patient states this is typical of his crisis.    Prophylaxis:  Lovenox, Protonix  CODE STATUS: FULL CODE  Other plan as per orders.  I have spent a total of 55 min on this admission  Avrianna Smart 05/06/2012, 9:34 PM

## 2012-05-06 NOTE — ED Notes (Signed)
Med given 

## 2012-05-06 NOTE — ED Notes (Signed)
 nss added to iv

## 2012-05-06 NOTE — ED Notes (Signed)
Waiting for a bed assignment.

## 2012-05-07 ENCOUNTER — Observation Stay (HOSPITAL_COMMUNITY): Payer: Medicaid Other

## 2012-05-07 LAB — STREP PNEUMONIAE URINARY ANTIGEN: Strep Pneumo Urinary Antigen: NEGATIVE

## 2012-05-07 LAB — URINE MICROSCOPIC-ADD ON

## 2012-05-07 LAB — URINALYSIS, ROUTINE W REFLEX MICROSCOPIC
Glucose, UA: NEGATIVE mg/dL
Hgb urine dipstick: NEGATIVE
Protein, ur: NEGATIVE mg/dL

## 2012-05-07 LAB — BASIC METABOLIC PANEL
BUN: 7 mg/dL (ref 6–23)
CO2: 27 mEq/L (ref 19–32)
Calcium: 8.8 mg/dL (ref 8.4–10.5)
Creatinine, Ser: 0.87 mg/dL (ref 0.50–1.35)
Glucose, Bld: 111 mg/dL — ABNORMAL HIGH (ref 70–99)

## 2012-05-07 LAB — CBC
HCT: 25.3 % — ABNORMAL LOW (ref 39.0–52.0)
MCHC: 35.2 g/dL (ref 30.0–36.0)
MCV: 72.3 fL — ABNORMAL LOW (ref 78.0–100.0)
RDW: 17.7 % — ABNORMAL HIGH (ref 11.5–15.5)

## 2012-05-07 MED ORDER — ALPRAZOLAM 0.25 MG PO TABS
0.2500 mg | ORAL_TABLET | Freq: Three times a day (TID) | ORAL | Status: DC
  Administered 2012-05-07 (×4): 0.25 mg via ORAL
  Administered 2012-05-08 (×2): 0.5 mg via ORAL
  Administered 2012-05-08 – 2012-05-09 (×2): 0.25 mg via ORAL
  Administered 2012-05-09: 0.5 mg via ORAL
  Administered 2012-05-09: 0.25 mg via ORAL
  Administered 2012-05-10 (×2): 0.5 mg via ORAL
  Administered 2012-05-10: 0.25 mg via ORAL
  Administered 2012-05-11: 0.5 mg via ORAL
  Filled 2012-05-07: qty 1
  Filled 2012-05-07 (×2): qty 2
  Filled 2012-05-07 (×4): qty 1
  Filled 2012-05-07: qty 2
  Filled 2012-05-07: qty 1
  Filled 2012-05-07 (×2): qty 2
  Filled 2012-05-07 (×2): qty 1
  Filled 2012-05-07: qty 2
  Filled 2012-05-07: qty 1
  Filled 2012-05-07: qty 2

## 2012-05-07 MED ORDER — MORPHINE SULFATE 4 MG/ML IJ SOLN
4.0000 mg | INTRAMUSCULAR | Status: DC | PRN
  Filled 2012-05-07 (×4): qty 1

## 2012-05-07 MED ORDER — DIPHENHYDRAMINE HCL 25 MG PO CAPS
25.0000 mg | ORAL_CAPSULE | Freq: Four times a day (QID) | ORAL | Status: DC | PRN
  Administered 2012-05-07 – 2012-05-11 (×6): 25 mg via ORAL
  Filled 2012-05-07 (×7): qty 1

## 2012-05-07 MED ORDER — VANCOMYCIN HCL IN DEXTROSE 1-5 GM/200ML-% IV SOLN
1000.0000 mg | Freq: Three times a day (TID) | INTRAVENOUS | Status: DC
Start: 2012-05-07 — End: 2012-05-10
  Administered 2012-05-07 – 2012-05-10 (×9): 1000 mg via INTRAVENOUS
  Filled 2012-05-07 (×11): qty 200

## 2012-05-07 MED ORDER — SODIUM CHLORIDE 0.9 % IJ SOLN
3.0000 mL | INTRAMUSCULAR | Status: DC | PRN
  Administered 2012-05-08: 3 mL via INTRAVENOUS

## 2012-05-07 MED ORDER — ACETAMINOPHEN-CODEINE #3 300-30 MG PO TABS
1.0000 | ORAL_TABLET | ORAL | Status: DC | PRN
  Administered 2012-05-07: 1 via ORAL
  Filled 2012-05-07: qty 1

## 2012-05-07 MED ORDER — ACETAMINOPHEN 650 MG RE SUPP: 650.0000 mg | RECTAL | Status: DC | PRN

## 2012-05-07 MED ORDER — ACETAMINOPHEN 325 MG PO TABS
650.0000 mg | ORAL_TABLET | ORAL | Status: DC | PRN
  Filled 2012-05-07: qty 2

## 2012-05-07 MED ORDER — PANTOPRAZOLE SODIUM 40 MG PO TBEC
40.0000 mg | DELAYED_RELEASE_TABLET | Freq: Every day | ORAL | Status: DC
  Administered 2012-05-07 – 2012-05-12 (×5): 40 mg via ORAL
  Filled 2012-05-07 (×6): qty 1

## 2012-05-07 MED ORDER — PANTOPRAZOLE SODIUM 40 MG IV SOLR
40.0000 mg | Freq: Every day | INTRAVENOUS | Status: DC
  Administered 2012-05-07 – 2012-05-09 (×2): 40 mg via INTRAVENOUS
  Filled 2012-05-07 (×4): qty 40

## 2012-05-07 MED ORDER — ONDANSETRON HCL 4 MG/2ML IJ SOLN: 4.0000 mg | INTRAMUSCULAR | Status: DC | PRN

## 2012-05-07 MED ORDER — OXYCODONE HCL 5 MG PO TABS
15.0000 mg | ORAL_TABLET | ORAL | Status: DC | PRN
  Administered 2012-05-07 – 2012-05-11 (×18): 15 mg via ORAL
  Filled 2012-05-07 (×6): qty 3
  Filled 2012-05-07: qty 2
  Filled 2012-05-07 (×3): qty 3
  Filled 2012-05-07: qty 1
  Filled 2012-05-07 (×9): qty 3

## 2012-05-07 MED ORDER — ONDANSETRON HCL 4 MG PO TABS: 4.0000 mg | ORAL_TABLET | ORAL | Status: DC | PRN

## 2012-05-07 MED ORDER — ACETAMINOPHEN 325 MG PO TABS
650.0000 mg | ORAL_TABLET | ORAL | Status: DC | PRN
  Administered 2012-05-07 – 2012-05-08 (×3): 650 mg via ORAL
  Filled 2012-05-07 (×2): qty 2

## 2012-05-07 MED ORDER — DOCUSATE SODIUM 100 MG PO CAPS
100.0000 mg | ORAL_CAPSULE | Freq: Two times a day (BID) | ORAL | Status: DC
  Administered 2012-05-07 – 2012-05-12 (×12): 100 mg via ORAL
  Filled 2012-05-07 (×14): qty 1

## 2012-05-07 MED ORDER — LEVOFLOXACIN IN D5W 750 MG/150ML IV SOLN
750.0000 mg | INTRAVENOUS | Status: AC
  Administered 2012-05-07 – 2012-05-09 (×3): 750 mg via INTRAVENOUS
  Filled 2012-05-07 (×3): qty 150

## 2012-05-07 MED ORDER — SODIUM CHLORIDE 0.9 % IV SOLN: 250.0000 mL | INTRAVENOUS | Status: DC | PRN

## 2012-05-07 MED ORDER — FOLIC ACID 1 MG PO TABS
1.0000 mg | ORAL_TABLET | Freq: Every day | ORAL | Status: DC
  Administered 2012-05-07 – 2012-05-12 (×6): 1 mg via ORAL
  Filled 2012-05-07 (×6): qty 1

## 2012-05-07 MED ORDER — IBUPROFEN 800 MG PO TABS
800.0000 mg | ORAL_TABLET | Freq: Three times a day (TID) | ORAL | Status: DC | PRN
  Administered 2012-05-09: 800 mg via ORAL
  Filled 2012-05-07 (×2): qty 1

## 2012-05-07 MED ORDER — ENOXAPARIN SODIUM 40 MG/0.4ML ~~LOC~~ SOLN
40.0000 mg | SUBCUTANEOUS | Status: DC
Start: 2012-05-07 — End: 2012-05-12
  Administered 2012-05-07 – 2012-05-12 (×6): 40 mg via SUBCUTANEOUS
  Filled 2012-05-07 (×6): qty 0.4

## 2012-05-07 MED ORDER — SODIUM CHLORIDE 0.9 % IJ SOLN
3.0000 mL | Freq: Two times a day (BID) | INTRAMUSCULAR | Status: DC
  Administered 2012-05-07 – 2012-05-08 (×4): 3 mL via INTRAVENOUS

## 2012-05-07 MED ORDER — DEXTROSE 5 % IV SOLN
2.0000 g | Freq: Three times a day (TID) | INTRAVENOUS | Status: DC
  Administered 2012-05-07 – 2012-05-10 (×9): 2 g via INTRAVENOUS
  Filled 2012-05-07 (×11): qty 2

## 2012-05-07 NOTE — Progress Notes (Signed)
[facility] progress note. Chief complaint. No fever 103.0. History of present illness. This 22 year old male in hospital with sickle cell crisis being treated with IV fluids, analgesics. He was noted by nursing staff to have a new fever of 103.0. I came  to see the patient at bedside and he has no specific complaints. Specifically he denies any chest pain, increased dyspnea, cough or productive sputum, nausea or vomiting, diarrhea, or dysuria. I initiated a workup including blood cultures x2, urinalysis with culture, and portable chest x-ray. A portable chest x-ray has resulted showing shallow inspiration with infiltration or atelectasis in both lung bases. Urine workup and blood cultures are still pending. Vital signs. Temperature 103, pulse 110, respiration 20, blood pressure 128/72. O2 sats 96%. General appearance. Well-developed young male who is alert, cooperative, in no distress. Cardiac. Rate and rhythm regular. No jugular venous distention or edema. Lungs. Diminished breath sounds in both bases. Some slight rhonchi noted in the left base. No distress or cough. Stable O2 sats. Abdomen. Soft with positive bowel sounds. No pain, guarding, rebound tenderness. Urinary genital. No bladder pain or CVA tenderness. Impression/plan. Problem #1. Fever rule out pneumonia. I am treating the patient for hospital-acquired pneumonia. Given allergy profile will initiate antibiotic coverage with vancomycin, aztreonam, and Levaquin. Sputum culture, Gram stain, strep pneumonia urinary antigen, Legionella antigen, HIV antibody, CBC with differential, and reticulocyte count requested and results are still pending. Patient appears clinically stable at this time.

## 2012-05-07 NOTE — Progress Notes (Signed)
[facility] PROGRESS NOTE  Paul Campos RUE:[id number] DOB: [birth year] DOA: 05/06/2012 PCP: Provider Not In System  Assessment/Plan: 1-sickle cell pain crisis: Improving.  -Continue IV fluids -will try to change pain medications to oral route -If patient tolerates his oral analgesic will DC home in the morning. -continue folic acid -follow at discharge at [facility] sickle cell clinic as he was doing PTA.  2-chest discomfort: no acute abnormalities on CXR or EKG; most likely associated with sickle cell crisis and GERD. -continue PPI  DVT: lovenox   Code Status: Full Family Communication: no family at bedside Disposition Plan: home when medically stable   Consultants:  none  Procedures:  See below for x-ray reprots  Antibiotics:  none  HPI/Subjective: Afebrile. Feeling better and at this moment reports his pain to be about 6-7/10 affecting his back and Lasix. No shortness of breath or any further discomfort in his chest.  Objective: Filed Vitals:   05/06/12 2334 05/07/12 0030 05/07/12 0454 05/07/12 1000  BP: 140/88 135/81 141/88 127/79  Pulse: 120 93 109 90  Temp: 98.9 F (37.2 C) 98.8 F (37.1 C) 98.6 F (37 C) 98.8 F (37.1 C)  TempSrc:  Oral Oral Oral  Resp: 20 22 24 22   Height:  6\' 1"  (1.854 m)    Weight:  78.336 kg (172 lb 11.2 oz)    SpO2: 99% 90% 95% 96%    Intake/Output Summary (Last 24 hours) at 05/07/12 1337 Last data filed at 05/07/12 0900  Gross per 24 hour  Intake   1360 ml  Output    450 ml  Net    910 ml   Filed Weights   05/07/12 0030  Weight: 78.336 kg (172 lb 11.2 oz)    Exam:   General:  No acute distress, afebrile and feeling better overall.  Cardiovascular: S1 and S2, no rubs or gallops  Respiratory: Clear to auscultation bilaterally  Abdomen: Soft, nontender, nondistended, positive bowel sounds  Musculoskeletal: No joint swelling or; no cyanosis, clubbing or lower extremity edema  Neuro: non focal  Data  Reviewed: Basic Metabolic Panel:  Recent Labs Lab 05/05/12 1730 05/06/12 1741 05/07/12 0530  NA 137 141 139  K 4.6 4.3 4.0  CL 99 106 102  CO2 28  --  27  GLUCOSE 83 118* 111*  BUN 10 6 7   CREATININE 0.92 0.70 0.87  CALCIUM 9.7  --  8.8  MG  --   --  2.2   Liver Function Tests:  Recent Labs Lab 05/05/12 1730  AST 47*  ALT 35  ALKPHOS 69  BILITOT 2.6*  PROT 8.2  ALBUMIN 4.8   CBC:  Recent Labs Lab 05/05/12 1730 05/06/12 1741 05/06/12 1820 05/07/12 0530  WBC 12.0*  --  19.1* 18.6*  NEUTROABS 7.4  --  14.9*  --   HGB 10.2* 10.9* 10.1* 8.9*  HCT 29.5* 32.0* 29.2* 25.3*  MCV 72.7*  --  72.6* 72.3*  PLT PLATELET CLUMPS NOTED ON SMEAR, COUNT APPEARS DECREASED  --  136* 159      Recent Results (from the past 240 hour(s))  MRSA PCR SCREENING     Status: None   Collection Time    05/07/12 12:51 AM      Result Value Range Status   MRSA by PCR NEGATIVE  NEGATIVE Final   Comment:            The GeneXpert MRSA Assay (FDA     approved for NASAL specimens     only),  is one component of a     comprehensive MRSA colonization     surveillance program. It is not     intended to diagnose MRSA     infection nor to guide or     monitor treatment for     MRSA infections.     Studies: Dg Chest 2 View  05/06/2012  *RADIOLOGY REPORT*  Clinical Data: Shortness of breath and chest pain for 2 days. Nonsmoker.  CHEST - 2 VIEW  Comparison: 04/05/2010  Findings: Heart size is normal.  Lung volumes are low.  There is perihilar peribronchial change.  Bibasilar atelectasis is present. There are no focal consolidations or pleural effusions.  IMPRESSION:  1.  Low lung volumes. 2.  Bronchitic changes.   Original Report Authenticated By: Norva Pavlov, M.D.     Scheduled Meds: . ALPRAZolam  0.25-0.5 mg Oral TID  . docusate sodium  100 mg Oral BID  . enoxaparin (LOVENOX) injection  40 mg Subcutaneous Q24H  . folic acid  1 mg Oral Daily  . pantoprazole  40 mg Oral Daily   Or  .  pantoprazole (PROTONIX) IV  40 mg Intravenous Daily  . sodium chloride  3 mL Intravenous Q12H   Continuous Infusions:   Active Problems:   Sickle cell pain crisis   Chest pain    Time spent: >30 minutes    Paul Campos  [facility] Pager [phone]. If 7PM-7AM, please contact night-coverage at [email or web address], password [facility] 05/07/2012, 1:37 PM  LOS: 1 day

## 2012-05-07 NOTE — Progress Notes (Signed)
ANTIBIOTIC CONSULT NOTE - INITIAL  Pharmacy Consult for vancomycin, aztreonam, levofloxacin Indication: pneumonia  Allergies  Allergen Reactions  . Dilaudid (Hydromorphone Hcl) Hives  . Hydromorphone Itching    Morphine okay.  . Cefotaxime Itching and Rash    Hives    Patient Measurements: Height: 6\' 1"  (185.4 cm) Weight: 172 lb (78.019 kg) IBW/kg (Calculated) : 79.9 Adjusted Body Weight: 78 kg  Vital Signs: Temp: 103 F (39.4 C) (03/16 2045) Temp src: Oral (03/16 2045) BP: 128/72 mmHg (03/16 2045) Pulse Rate: 110 (03/16 2045) Intake/Output from previous day: 03/15 0701 - 03/16 0700 In: 1000 [I.V.:1000] Out: -  Intake/Output from this shift:    Labs:  Recent Labs  05/05/12 1730 05/06/12 1741 05/06/12 1820 05/07/12 0530  WBC 12.0*  --  19.1* 18.6*  HGB 10.2* 10.9* 10.1* 8.9*  PLT PLATELET CLUMPS NOTED ON SMEAR, COUNT APPEARS DECREASED  --  136* 159  CREATININE 0.92 0.70  --  0.87   Estimated Creatinine Clearance: 149.4 ml/min (by C-G formula based on Cr of 0.87). No results found for this basename: VANCOTROUGH, Leodis Binet, VANCORANDOM, GENTTROUGH, GENTPEAK, GENTRANDOM, TOBRATROUGH, TOBRAPEAK, TOBRARND, AMIKACINPEAK, AMIKACINTROU, AMIKACIN,  in the last 72 hours   Microbiology: Recent Results (from the past 720 hour(s))  MRSA PCR SCREENING     Status: None   Collection Time    05/07/12 12:51 AM      Result Value Range Status   MRSA by PCR NEGATIVE  NEGATIVE Final   Comment:            The GeneXpert MRSA Assay (FDA     approved for NASAL specimens     only), is one component of a     comprehensive MRSA colonization     surveillance program. It is not     intended to diagnose MRSA     infection nor to guide or     monitor treatment for     MRSA infections.    Medical History: Past Medical History  Diagnosis Date  . Sickle cell anemia   . Sickle cell anemia     Medications:  Prescriptions prior to admission  Medication Sig Dispense Refill  .  acetaminophen-codeine (TYLENOL #3) 300-30 MG per tablet Take 1 tablet by mouth every 4 (four) hours as needed for pain.      Marland Kitchen ibuprofen (ADVIL,MOTRIN) 200 MG tablet Take 200 mg by mouth every 6 (six) hours as needed for pain.      Marland Kitchen oxyCODONE-acetaminophen (PERCOCET/ROXICET) 5-325 MG per tablet Take 1 tablet by mouth every 4 (four) hours as needed for pain.  15 tablet  0   Assessment: 22 year old man admitted for sickle cell crisis found to have fever to 103.  Vancomycin, levofloxacin, and azactam to start for pneumonia.  Goal of Therapy:  Vancomycin trough level 15-20 mcg/ml  Plan:  Follow up culture results Vancomycin 1g IV q8h Will follow up cultures and renal function and check a vancomycin trough as needed. Can continue azactam and levofloxacin as ordered.  Mickeal Skinner 05/07/2012,9:59 PM

## 2012-05-07 NOTE — Progress Notes (Signed)
Utilization review completed.  

## 2012-05-07 NOTE — ED Notes (Signed)
Report called to [phone] 

## 2012-05-08 DIAGNOSIS — J189 Pneumonia, unspecified organism: Secondary | ICD-10-CM

## 2012-05-08 DIAGNOSIS — D72829 Elevated white blood cell count, unspecified: Secondary | ICD-10-CM

## 2012-05-08 DIAGNOSIS — R509 Fever, unspecified: Secondary | ICD-10-CM

## 2012-05-08 LAB — CBC WITH DIFFERENTIAL/PLATELET
Basophils Absolute: 0 10*3/uL (ref 0.0–0.1)
Eosinophils Absolute: 0.2 10*3/uL (ref 0.0–0.7)
MCH: 25.3 pg — ABNORMAL LOW (ref 26.0–34.0)
MCHC: 34.6 g/dL (ref 30.0–36.0)
Monocytes Absolute: 2.7 10*3/uL — ABNORMAL HIGH (ref 0.1–1.0)
Neutrophils Relative %: 63 % (ref 43–77)
Platelets: 119 10*3/uL — ABNORMAL LOW (ref 150–400)
RBC: 3.52 MIL/uL — ABNORMAL LOW (ref 4.22–5.81)
RDW: 17.5 % — ABNORMAL HIGH (ref 11.5–15.5)

## 2012-05-08 LAB — EXPECTORATED SPUTUM ASSESSMENT W GRAM STAIN, RFLX TO RESP C

## 2012-05-08 LAB — HIV ANTIBODY (ROUTINE TESTING W REFLEX): HIV: NONREACTIVE

## 2012-05-08 LAB — LEGIONELLA ANTIGEN, URINE

## 2012-05-08 MED ORDER — ACETAMINOPHEN 325 MG PO TABS
650.0000 mg | ORAL_TABLET | ORAL | Status: DC | PRN
  Administered 2012-05-08 – 2012-05-09 (×2): 650 mg via ORAL
  Filled 2012-05-08 (×2): qty 2

## 2012-05-08 MED ORDER — IBUPROFEN 400 MG PO TABS
400.0000 mg | ORAL_TABLET | Freq: Three times a day (TID) | ORAL | Status: DC | PRN
  Administered 2012-05-10: 400 mg via ORAL
  Filled 2012-05-08 (×2): qty 1

## 2012-05-08 MED ORDER — ACETAMINOPHEN 650 MG RE SUPP: 650.0000 mg | RECTAL | Status: DC | PRN

## 2012-05-08 MED ORDER — SODIUM CHLORIDE 0.9 % IV SOLN
INTRAVENOUS | Status: DC
  Administered 2012-05-08: 10:00:00 via INTRAVENOUS
  Administered 2012-05-09: 1000 mL via INTRAVENOUS

## 2012-05-08 NOTE — Progress Notes (Addendum)
[facility] PROGRESS NOTE  Paul Campos ZOX:[id number] DOB: [birth year] DOA: 05/06/2012 PCP: Provider Not In System  Assessment/Plan: 1-sickle cell pain crisis: Improving.  -Continue IV fluids -continue PO analgesics -continue folic acid -follow at discharge at [facility] sickle cell clinic as he was doing PTA.  2-chest discomfort: no acute abnormalities on CXR or EKG; most likely associated with sickle cell crisis and GERD. -continue PPI  3-HCAP: started on antibiotics (levaquin, aztreonam and vanc) -still spiking fever (continue antipyretics)  4-Leukocytosis:due to pna. -trending down  DVT: lovenox   Code Status: Full Family Communication: no family at bedside Disposition Plan: home when medically stable   Consultants:  none  Procedures:  See below for x-ray reprots  Antibiotics:  Vanc, aztreonam and levaquin 3/16  HPI/Subjective: Spiking high fever; still with pain on his back; coughing spell and chills.  Objective: Filed Vitals:   05/08/12 1018 05/08/12 1355 05/08/12 1800 05/08/12 1838  BP: 121/70 113/62 116/70   Pulse: 84 85 86   Temp: 102.1 F (38.9 C) 99.4 F (37.4 C) 99.6 F (37.6 C) 99.2 F (37.3 C)  TempSrc: Oral Oral Oral   Resp: 18 18 18    Height:      Weight:      SpO2: 97% 98% 98%     Intake/Output Summary (Last 24 hours) at 05/08/12 1902 Last data filed at 05/08/12 1700  Gross per 24 hour  Intake 2488.75 ml  Output      0 ml  Net 2488.75 ml   Filed Weights   05/07/12 0030 05/07/12 2045  Weight: 78.336 kg (172 lb 11.2 oz) 78.019 kg (172 lb)    Exam:   General:  Fever; still with pain on his back; coughing spell and chills.  Cardiovascular: S1 and S2, no rubs or gallops  Respiratory: scattered rhonchi, no wheezing  Abdomen: Soft, nontender, nondistended, positive bowel sounds  Musculoskeletal: No joint swelling or; no cyanosis, clubbing or lower extremity edema  Neuro: non focal  Data Reviewed: Basic Metabolic  Panel:  Recent Labs Lab 05/05/12 1730 05/06/12 1741 05/07/12 0530  NA 137 141 139  K 4.6 4.3 4.0  CL 99 106 102  CO2 28  --  27  GLUCOSE 83 118* 111*  BUN 10 6 7   CREATININE 0.92 0.70 0.87  CALCIUM 9.7  --  8.8  MG  --   --  2.2   Liver Function Tests:  Recent Labs Lab 05/05/12 1730  AST 47*  ALT 35  ALKPHOS 69  BILITOT 2.6*  PROT 8.2  ALBUMIN 4.8   CBC:  Recent Labs Lab 05/05/12 1730 05/06/12 1741 05/06/12 1820 05/07/12 0530 05/07/12 2221  WBC 12.0*  --  19.1* 18.6* 15.8*  NEUTROABS 7.4  --  14.9*  --  9.9*  HGB 10.2* 10.9* 10.1* 8.9* 8.9*  HCT 29.5* 32.0* 29.2* 25.3* 25.7*  MCV 72.7*  --  72.6* 72.3* 73.0*  PLT PLATELET CLUMPS NOTED ON SMEAR, COUNT APPEARS DECREASED  --  136* 159 119*      Recent Results (from the past 240 hour(s))  MRSA PCR SCREENING     Status: None   Collection Time    05/07/12 12:51 AM      Result Value Range Status   MRSA by PCR NEGATIVE  NEGATIVE Final   Comment:            The GeneXpert MRSA Assay (FDA     approved for NASAL specimens     only), is one component of a  comprehensive MRSA colonization     surveillance program. It is not     intended to diagnose MRSA     infection nor to guide or     monitor treatment for     MRSA infections.  CULTURE, EXPECTORATED SPUTUM-ASSESSMENT     Status: None   Collection Time    05/08/12 12:42 AM      Result Value Range Status   Specimen Description SPUTUM   Final   Special Requests NONE   Final   Sputum evaluation     Final   Value: MICROSCOPIC FINDINGS SUGGEST THAT THIS SPECIMEN IS NOT REPRESENTATIVE OF LOWER RESPIRATORY SECRETIONS. PLEASE RECOLLECT.     CALLED TO R.HILL RN [phone] 05/08/12 E.GADDY   Report Status 05/08/2012 FINAL   Final     Studies: Dg Chest Port 1 View  05/07/2012  *RADIOLOGY REPORT*  Clinical Data: Sudden onset of fever.  Sickle cell anemia.  PORTABLE CHEST - 1 VIEW  Comparison: 05/06/2012  Findings: Shallow inspiration.  Borderline heart size and  pulmonary vascularity are likely normal for technique.  Infiltration or atelectasis in both lung bases.  No blunting of costophrenic angles.  No pneumothorax.  Mediastinal contours appear intact.  IMPRESSION: Shallow inspiration with infiltration or atelectasis in both lung bases.   Original Report Authenticated By: Burman Nieves, M.D.     Scheduled Meds: . ALPRAZolam  0.25-0.5 mg Oral TID  . aztreonam  2 g Intravenous Q8H  . docusate sodium  100 mg Oral BID  . enoxaparin (LOVENOX) injection  40 mg Subcutaneous Q24H  . folic acid  1 mg Oral Daily  . levofloxacin (LEVAQUIN) IV  750 mg Intravenous Q24H  . pantoprazole  40 mg Oral Daily   Or  . pantoprazole (PROTONIX) IV  40 mg Intravenous Daily  . sodium chloride  3 mL Intravenous Q12H  . vancomycin  1,000 mg Intravenous Q8H   Continuous Infusions: . sodium chloride 75 mL/hr at 05/08/12 1025    Active Problems:   Sickle cell pain crisis   Chest pain    Time spent: >30 minutes    Yarieliz Wasser  [facility] Pager [phone]. If 7PM-7AM, please contact night-coverage at [email or web address], password [facility] 05/08/2012, 7:02 PM  LOS: 2 days

## 2012-05-09 LAB — URINE CULTURE
Colony Count: NO GROWTH
Culture: NO GROWTH

## 2012-05-09 NOTE — Progress Notes (Signed)
Pt seen awake in bed playing with his cell phone. Pt requested  pain medication for his chest. It was too early to give Oxycodone since his last dose was at 2144 hrs. We gave Ibuprofen 800 mg for mild pain. We encourage patient to deep breath and cough and instructed him in using the Incentive Spirometer. Pt informed us that he knows how to use it and then requested Ginger Ail with ice.We will continue to monitor.

## 2012-05-09 NOTE — Progress Notes (Signed)
Utilization review completed.  

## 2012-05-09 NOTE — Progress Notes (Signed)
[facility] PROGRESS NOTE  Paul Campos WUJ:[id number] DOB: 03-Jun-1990 DOA: 05/06/2012 PCP: Provider Not In System  Assessment/Plan: 1-sickle cell pain crisis: Improving.  -Continue IV fluids -continue PO analgesics -continue folic acid -follow at discharge at [facility] sickle cell clinic as he was doing PTA.  2-chest discomfort: without no acute abnormalities on CXR initially; but then demonstarted to be associated with PNA. No changes on EKG. Contributing factors are sickle cell crisis and GERD. -continue PPI -cont tx for PNa; looking for 24 hr w/o fever and 3 days of IV treatment before discharge.  3-HCAP: started on antibiotics (levaquin, aztreonam and vanc; day #2) -last fever was 3/17 at 4-5 PM (continue antipyretics) -continue antibiotics  4-Leukocytosis: due to pna. -trending down  DVT: lovenox   Code Status: Full Family Communication: no family at bedside Disposition Plan: home when medically stable   Consultants:  none  Procedures:  See below for x-ray reprots  Antibiotics:  Vanc, aztreonam and levaquin 3/16  HPI/Subjective: Spiking high fever; still with pain on his back; coughing spell and chills.  Objective: Filed Vitals:   05/08/12 2258 05/09/12 0437 05/09/12 0947 05/09/12 1400  BP: 112/63 106/76 116/74 110/72  Pulse: 92 56 70 72  Temp: 99.9 F (37.7 C) 97.7 F (36.5 C) 98.6 F (37 C) 98.6 F (37 C)  TempSrc: Oral Oral Oral Oral  Resp: 22 20 20 20   Height:      Weight:      SpO2: 93% 100% 100% 100%    Intake/Output Summary (Last 24 hours) at 05/09/12 1539 Last data filed at 05/09/12 1402  Gross per 24 hour  Intake 2518.75 ml  Output     60 ml  Net 2458.75 ml   Filed Weights   05/07/12 0030 05/07/12 2045 05/08/12 2026  Weight: 78.336 kg (172 lb 11.2 oz) 78.019 kg (172 lb) 78.109 kg (172 lb 3.2 oz)    Exam:   General:  Fever; still with pain on his back; coughing spell and chills.  Cardiovascular: S1 and S2, no rubs or  gallops  Respiratory: scattered rhonchi, no wheezing  Abdomen: Soft, nontender, nondistended, positive bowel sounds  Musculoskeletal: No joint swelling or; no cyanosis, clubbing or lower extremity edema  Neuro: non focal  Data Reviewed: Basic Metabolic Panel:  Recent Labs Lab 05/05/12 1730 05/06/12 1741 05/07/12 0530  NA 137 141 139  K 4.6 4.3 4.0  CL 99 106 102  CO2 28  --  27  GLUCOSE 83 118* 111*  BUN 10 6 7   CREATININE 0.92 0.70 0.87  CALCIUM 9.7  --  8.8  MG  --   --  2.2   Liver Function Tests:  Recent Labs Lab 05/05/12 1730  AST 47*  ALT 35  ALKPHOS 69  BILITOT 2.6*  PROT 8.2  ALBUMIN 4.8   CBC:  Recent Labs Lab 05/05/12 1730 05/06/12 1741 05/06/12 1820 05/07/12 0530 05/07/12 2221  WBC 12.0*  --  19.1* 18.6* 15.8*  NEUTROABS 7.4  --  14.9*  --  9.9*  HGB 10.2* 10.9* 10.1* 8.9* 8.9*  HCT 29.5* 32.0* 29.2* 25.3* 25.7*  MCV 72.7*  --  72.6* 72.3* 73.0*  PLT PLATELET CLUMPS NOTED ON SMEAR, COUNT APPEARS DECREASED  --  136* 159 119*      Recent Results (from the past 240 hour(s))  MRSA PCR SCREENING     Status: None   Collection Time    05/07/12 12:51 AM      Result Value Range Status  MRSA by PCR NEGATIVE  NEGATIVE Final   Comment:            The GeneXpert MRSA Assay (FDA     approved for NASAL specimens     only), is one component of a     comprehensive MRSA colonization     surveillance program. It is not     intended to diagnose MRSA     infection nor to guide or     monitor treatment for     MRSA infections.  CULTURE, BLOOD (ROUTINE X 2)     Status: None   Collection Time    05/07/12  9:11 PM      Result Value Range Status   Specimen Description BLOOD LEFT ARM   Final   Special Requests BOTTLES DRAWN AEROBIC ONLY 9CC   Final   Culture  Setup Time 05/08/2012 06:42   Final   Culture     Final   Value:        BLOOD CULTURE RECEIVED NO GROWTH TO DATE CULTURE WILL BE HELD FOR 5 DAYS BEFORE ISSUING A FINAL NEGATIVE REPORT   Report  Status PENDING   Incomplete  CULTURE, BLOOD (ROUTINE X 2)     Status: None   Collection Time    05/07/12  9:15 PM      Result Value Range Status   Specimen Description BLOOD LEFT HAND   Final   Special Requests BOTTLES DRAWN AEROBIC ONLY 10CC   Final   Culture  Setup Time 05/08/2012 06:42   Final   Culture     Final   Value:        BLOOD CULTURE RECEIVED NO GROWTH TO DATE CULTURE WILL BE HELD FOR 5 DAYS BEFORE ISSUING A FINAL NEGATIVE REPORT   Report Status PENDING   Incomplete  URINE CULTURE     Status: None   Collection Time    05/07/12  9:51 PM      Result Value Range Status   Specimen Description URINE, CLEAN CATCH   Final   Special Requests NONE   Final   Culture  Setup Time 05/07/2012 22:33   Final   Colony Count NO GROWTH   Final   Culture NO GROWTH   Final   Report Status 05/09/2012 FINAL   Final  CULTURE, EXPECTORATED SPUTUM-ASSESSMENT     Status: None   Collection Time    05/08/12 12:42 AM      Result Value Range Status   Specimen Description SPUTUM   Final   Special Requests NONE   Final   Sputum evaluation     Final   Value: MICROSCOPIC FINDINGS SUGGEST THAT THIS SPECIMEN IS NOT REPRESENTATIVE OF LOWER RESPIRATORY SECRETIONS. PLEASE RECOLLECT.     CALLED TO R.HILL RN 0142 05/08/12 E.GADDY   Report Status 05/08/2012 FINAL   Final     Studies: Dg Chest Port 1 View  05/07/2012  *RADIOLOGY REPORT*  Clinical Data: Sudden onset of fever.  Sickle cell anemia.  PORTABLE CHEST - 1 VIEW  Comparison: 05/06/2012  Findings: Shallow inspiration.  Borderline heart size and pulmonary vascularity are likely normal for technique.  Infiltration or atelectasis in both lung bases.  No blunting of costophrenic angles.  No pneumothorax.  Mediastinal contours appear intact.  IMPRESSION: Shallow inspiration with infiltration or atelectasis in both lung bases.   Original Report Authenticated By: Burman Nieves, M.D.     Scheduled Meds: . ALPRAZolam  0.25-0.5 mg Oral TID  . aztreonam  2 g  Intravenous Q8H  . docusate sodium  100 mg Oral BID  . enoxaparin (LOVENOX) injection  40 mg Subcutaneous Q24H  . folic acid  1 mg Oral Daily  . levofloxacin (LEVAQUIN) IV  750 mg Intravenous Q24H  . pantoprazole  40 mg Oral Daily   Or  . pantoprazole (PROTONIX) IV  40 mg Intravenous Daily  . sodium chloride  3 mL Intravenous Q12H  . vancomycin  1,000 mg Intravenous Q8H   Continuous Infusions: . sodium chloride 75 mL/hr at 05/08/12 1025    Active Problems:   Sickle cell pain crisis   Chest pain    Time spent: >30 minutes    Thoren Hosang  [facility] Pager [phone]. If 7PM-7AM, please contact night-coverage at [email or web address], password [facility] 05/09/2012, 3:39 PM  LOS: 3 days

## 2012-05-10 LAB — CBC
Hemoglobin: 8.2 g/dL — ABNORMAL LOW (ref 13.0–17.0)
MCHC: 34.2 g/dL (ref 30.0–36.0)

## 2012-05-10 LAB — VANCOMYCIN, TROUGH: Vancomycin Tr: 10.8 ug/mL (ref 10.0–20.0)

## 2012-05-10 MED ORDER — LEVOFLOXACIN 750 MG PO TABS
750.0000 mg | ORAL_TABLET | Freq: Every day | ORAL | Status: DC
  Administered 2012-05-10 – 2012-05-11 (×2): 750 mg via ORAL
  Filled 2012-05-10 (×3): qty 1

## 2012-05-10 MED ORDER — ACETAMINOPHEN 325 MG PO TABS
650.0000 mg | ORAL_TABLET | ORAL | Status: DC | PRN
  Administered 2012-05-11: 650 mg via ORAL
  Filled 2012-05-10: qty 2

## 2012-05-10 MED ORDER — ACETAMINOPHEN 650 MG RE SUPP: 650.0000 mg | RECTAL | Status: DC | PRN

## 2012-05-10 MED ORDER — SODIUM CHLORIDE 0.45 % IV SOLN
INTRAVENOUS | Status: DC
  Administered 2012-05-10 – 2012-05-11 (×2): via INTRAVENOUS
  Administered 2012-05-11: 150 mL/h via INTRAVENOUS
  Administered 2012-05-12: 03:00:00 via INTRAVENOUS

## 2012-05-10 NOTE — Progress Notes (Signed)
[facility] PROGRESS NOTE  Cavion Faiola WUJ:[id number] DOB: 28-Jun-1990 DOA: 05/06/2012 PCP: Provider Not In System  Assessment/Plan: 1-sickle cell pain crisis: Improving.  -Continue IV fluids-change to hypotonic. -continue PO analgesics -continue folic acid -follow at discharge at [facility] sickle cell clinic as he was doing PTA. 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 Does not require frequent transfusions-last transfusion when he was 22 years old. Mild back pain. Overall improving. - Followup CBCs today.  2-chest discomfort: without no acute abnormalities on CXR initially; but then demonstarted to be associated with PNA. No changes on EKG. Contributing factors are sickle cell crisis and GERD. -continue PPI -cont tx for PNa; looking for 24 hr w/o fever and 3 days of IV treatment before discharge. - No further chest pains. Denies cough.  3-HCAP: started on antibiotics (levaquin, aztreonam and vanc; day #2) -last fever was 3/17 at 4-5 PM (continue antipyretics) -Descalate antibiotics to oral Levaquin. Blood cultures x2 and urine cultures: Negative to date.  4-Leukocytosis: due to pna. -trending down  DVT: lovenox   Code Status: Full Family Communication: no family at bedside Disposition Plan: home when medically stable-Possibly 3/20   Consultants:  none  Procedures:  See below for x-ray reprots  Antibiotics:  Vanc, aztreonam and levaquin 3/16  HPI/Subjective: Back pain, rated at 6/10. Improving. Denies chest pain, cough or dyspnea. Ambulating in room.   Objective: Filed Vitals:   05/09/12 2136 05/10/12 0006 05/10/12 0531 05/10/12 1327  BP: 130/71  123/69 114/67  Pulse: 87  84 62  Temp: 100.5 F (38.1 C) 98.6 F (37 C) 100.5 F (38.1 C) 98.4 F (36.9 C)  TempSrc: Oral Oral Oral Oral  Resp: 18  17 18   Height:      Weight: 77.701 kg (171 lb 4.8 oz)     SpO2: 97%  97% 98%    Intake/Output Summary (Last 24 hours) at 05/10/12 1531 Last  data filed at 05/10/12 1300  Gross per 24 hour  Intake   1520 ml  Output      0 ml  Net   1520 ml   Filed Weights   05/07/12 2045 05/08/12 2026 05/09/12 2136  Weight: 78.019 kg (172 lb) 78.109 kg (172 lb 3.2 oz) 77.701 kg (171 lb 4.8 oz)    Exam:   General: Ambulating in room. Appears comfortable.   Cardiovascular: S1 and S2, no rubs or gallops  Respiratory:  clear to auscultation. No increased work of breathing.   Abdomen: Soft, nontender, nondistended, positive bowel sounds  Musculoskeletal: No joint swelling or; no cyanosis, clubbing or lower extremity edema  Neuro: non focal. Alert and oriented.  Data Reviewed: Basic Metabolic Panel:  Recent Labs Lab 05/05/12 1730 05/06/12 1741 05/07/12 0530  NA 137 141 139  K 4.6 4.3 4.0  CL 99 106 102  CO2 28  --  27  GLUCOSE 83 118* 111*  BUN 10 6 7   CREATININE 0.92 0.70 0.87  CALCIUM 9.7  --  8.8  MG  --   --  2.2   Liver Function Tests:  Recent Labs Lab 05/05/12 1730  AST 47*  ALT 35  ALKPHOS 69  BILITOT 2.6*  PROT 8.2  ALBUMIN 4.8   CBC:  Recent Labs Lab 05/05/12 1730 05/06/12 1741 05/06/12 1820 05/07/12 0530 05/07/12 2221  WBC 12.0*  --  19.1* 18.6* 15.8*  NEUTROABS 7.4  --  14.9*  --  9.9*  HGB 10.2* 10.9* 10.1* 8.9* 8.9*  HCT 29.5* 32.0* 29.2* 25.3* 25.7*  MCV 72.7*  --  72.6* 72.3* 73.0*  PLT PLATELET CLUMPS NOTED ON SMEAR, COUNT APPEARS DECREASED  --  136* 159 119*      Recent Results (from the past 240 hour(s))  MRSA PCR SCREENING     Status: None   Collection Time    05/07/12 12:51 AM      Result Value Range Status   MRSA by PCR NEGATIVE  NEGATIVE Final   Comment:            The GeneXpert MRSA Assay (FDA     approved for NASAL specimens     only), is one component of a     comprehensive MRSA colonization     surveillance program. It is not     intended to diagnose MRSA     infection nor to guide or     monitor treatment for     MRSA infections.  CULTURE, BLOOD (ROUTINE X 2)      Status: None   Collection Time    05/07/12  9:11 PM      Result Value Range Status   Specimen Description BLOOD LEFT ARM   Final   Special Requests BOTTLES DRAWN AEROBIC ONLY 9CC   Final   Culture  Setup Time 05/08/2012 06:42   Final   Culture     Final   Value:        BLOOD CULTURE RECEIVED NO GROWTH TO DATE CULTURE WILL BE HELD FOR 5 DAYS BEFORE ISSUING A FINAL NEGATIVE REPORT   Report Status PENDING   Incomplete  CULTURE, BLOOD (ROUTINE X 2)     Status: None   Collection Time    05/07/12  9:15 PM      Result Value Range Status   Specimen Description BLOOD LEFT HAND   Final   Special Requests BOTTLES DRAWN AEROBIC ONLY 10CC   Final   Culture  Setup Time 05/08/2012 06:42   Final   Culture     Final   Value:        BLOOD CULTURE RECEIVED NO GROWTH TO DATE CULTURE WILL BE HELD FOR 5 DAYS BEFORE ISSUING A FINAL NEGATIVE REPORT   Report Status PENDING   Incomplete  URINE CULTURE     Status: None   Collection Time    05/07/12  9:51 PM      Result Value Range Status   Specimen Description URINE, CLEAN CATCH   Final   Special Requests NONE   Final   Culture  Setup Time 05/07/2012 22:33   Final   Colony Count NO GROWTH   Final   Culture NO GROWTH   Final   Report Status 05/09/2012 FINAL   Final  CULTURE, EXPECTORATED SPUTUM-ASSESSMENT     Status: None   Collection Time    05/08/12 12:42 AM      Result Value Range Status   Specimen Description SPUTUM   Final   Special Requests NONE   Final   Sputum evaluation     Final   Value: MICROSCOPIC FINDINGS SUGGEST THAT THIS SPECIMEN IS NOT REPRESENTATIVE OF LOWER RESPIRATORY SECRETIONS. PLEASE RECOLLECT.     CALLED TO R.HILL RN 0142 05/08/12 E.GADDY   Report Status 05/08/2012 FINAL   Final     Studies: No results found.  Scheduled Meds: . ALPRAZolam  0.25-0.5 mg Oral TID  . aztreonam  2 g Intravenous Q8H  . docusate sodium  100 mg Oral BID  . enoxaparin (LOVENOX) injection  40 mg Subcutaneous  Q24H  . folic acid  1 mg Oral Daily  .  pantoprazole  40 mg Oral Daily   Or  . pantoprazole (PROTONIX) IV  40 mg Intravenous Daily  . sodium chloride  3 mL Intravenous Q12H  . vancomycin  1,000 mg Intravenous Q8H   Continuous Infusions: . sodium chloride 1,000 mL (05/09/12 1737)    Active Problems:   Sickle cell pain crisis   Chest pain    Time spent: >30 minutes    [facility]  [facility] Pager [phone]. If 7PM-7AM, please contact night-coverage at [email or web address], password [facility] 05/10/2012, 3:31 PM  LOS: 4 days

## 2012-05-10 NOTE — Progress Notes (Signed)
ANTIBIOTIC CONSULT NOTE - Follow- up  Pharmacy Consult for vancomycin, aztreonam, levofloxacin Indication: pneumonia  Allergies  Allergen Reactions  . Dilaudid (Hydromorphone Hcl) Hives  . Hydromorphone Itching    Morphine okay.  . Cefotaxime Itching and Rash    Hives    Patient Measurements: Height: 6\' 1"  (185.4 cm) Weight: 171 lb 4.8 oz (77.701 kg) IBW/kg (Calculated) : 79.9 Adjusted Body Weight: 78 kg  Vital Signs: Temp: 98.4 F (36.9 C) (03/19 1327) Temp src: Oral (03/19 1327) BP: 114/67 mmHg (03/19 1327) Pulse Rate: 62 (03/19 1327) Intake/Output from previous day: 03/18 0701 - 03/19 0700 In: 2120 [P.O.:1320; I.V.:550; IV Piggyback:250] Out: -  Intake/Output from this shift: Total I/O In: 840 [P.O.:840] Out: -   Labs:  Recent Labs  05/07/12 2221  WBC 15.8*  HGB 8.9*  PLT 119*   Estimated Creatinine Clearance: 148.9 ml/min (by C-G formula based on Cr of 0.87).  Recent Labs  05/10/12 1432  VANCOTROUGH 10.8     Microbiology: Recent Results (from the past 720 hour(s))  MRSA PCR SCREENING     Status: None   Collection Time    05/07/12 12:51 AM      Result Value Range Status   MRSA by PCR NEGATIVE  NEGATIVE Final   Comment:            The GeneXpert MRSA Assay (FDA     approved for NASAL specimens     only), is one component of a     comprehensive MRSA colonization     surveillance program. It is not     intended to diagnose MRSA     infection nor to guide or     monitor treatment for     MRSA infections.  CULTURE, BLOOD (ROUTINE X 2)     Status: None   Collection Time    05/07/12  9:11 PM      Result Value Range Status   Specimen Description BLOOD LEFT ARM   Final   Special Requests BOTTLES DRAWN AEROBIC ONLY 9CC   Final   Culture  Setup Time 05/08/2012 06:42   Final   Culture     Final   Value:        BLOOD CULTURE RECEIVED NO GROWTH TO DATE CULTURE WILL BE HELD FOR 5 DAYS BEFORE ISSUING A FINAL NEGATIVE REPORT   Report Status PENDING    Incomplete  CULTURE, BLOOD (ROUTINE X 2)     Status: None   Collection Time    05/07/12  9:15 PM      Result Value Range Status   Specimen Description BLOOD LEFT HAND   Final   Special Requests BOTTLES DRAWN AEROBIC ONLY 10CC   Final   Culture  Setup Time 05/08/2012 06:42   Final   Culture     Final   Value:        BLOOD CULTURE RECEIVED NO GROWTH TO DATE CULTURE WILL BE HELD FOR 5 DAYS BEFORE ISSUING A FINAL NEGATIVE REPORT   Report Status PENDING   Incomplete  URINE CULTURE     Status: None   Collection Time    05/07/12  9:51 PM      Result Value Range Status   Specimen Description URINE, CLEAN CATCH   Final   Special Requests NONE   Final   Culture  Setup Time 05/07/2012 22:33   Final   Colony Count NO GROWTH   Final   Culture NO GROWTH   Final  Report Status 05/09/2012 FINAL   Final  CULTURE, EXPECTORATED SPUTUM-ASSESSMENT     Status: None   Collection Time    05/08/12 12:42 AM      Result Value Range Status   Specimen Description SPUTUM   Final   Special Requests NONE   Final   Sputum evaluation     Final   Value: MICROSCOPIC FINDINGS SUGGEST THAT THIS SPECIMEN IS NOT REPRESENTATIVE OF LOWER RESPIRATORY SECRETIONS. PLEASE RECOLLECT.     CALLED TO R.HILL RN 0142 05/08/12 E.GADDY   Report Status 05/08/2012 FINAL   Final    Medical History: Past Medical History  Diagnosis Date  . Sickle cell anemia   . Sickle cell anemia     Medications:  Prescriptions prior to admission  Medication Sig Dispense Refill  . acetaminophen-codeine (TYLENOL #3) 300-30 MG per tablet Take 1 tablet by mouth every 4 (four) hours as needed for pain.      Marland Kitchen ibuprofen (ADVIL,MOTRIN) 200 MG tablet Take 200 mg by mouth every 6 (six) hours as needed for pain.      Marland Kitchen oxyCODONE-acetaminophen (PERCOCET/ROXICET) 5-325 MG per tablet Take 1 tablet by mouth every 4 (four) hours as needed for pain.  15 tablet  0   Assessment: 22 year old man admitted for sickle cell crisis found to have fever to 103.   Vancomycin, levofloxacin, and azactam started for PNA.  Vancomycin trough slightly subtherapeutic on 1g IV q 8 hrs.  Goal of Therapy:  Vancomycin trough level 15-20 mcg/ml  Plan:  1. Spoke with Dr. Waymon Amato, planning to narrow antibiotics to oral Levaquin. 2. D/C vancomycin and aztreonam 3. Con't Levaquin 750 mg po daily thru total of 8 days.  Tad [name], BCPS  Clinical Pharmacist Pager [phone]  05/10/2012 3:46 PM

## 2012-05-11 ENCOUNTER — Inpatient Hospital Stay (HOSPITAL_COMMUNITY): Payer: Medicaid Other

## 2012-05-11 LAB — CBC
MCV: 71.2 fL — ABNORMAL LOW (ref 78.0–100.0)
Platelets: 123 10*3/uL — ABNORMAL LOW (ref 150–400)
RBC: 3.64 MIL/uL — ABNORMAL LOW (ref 4.22–5.81)
WBC: 9.6 10*3/uL (ref 4.0–10.5)

## 2012-05-11 LAB — RETICULOCYTES
RBC.: 3.46 MIL/uL — ABNORMAL LOW (ref 4.22–5.81)
Retic Count, Absolute: 363.3 10*3/uL — ABNORMAL HIGH (ref 19.0–186.0)
Retic Ct Pct: 10.5 % — ABNORMAL HIGH (ref 0.4–3.1)

## 2012-05-11 LAB — LACTATE DEHYDROGENASE: LDH: 463 U/L — ABNORMAL HIGH (ref 94–250)

## 2012-05-11 MED ORDER — OXYCODONE HCL 5 MG PO TABS
10.0000 mg | ORAL_TABLET | ORAL | Status: DC | PRN
  Administered 2012-05-11 – 2012-05-12 (×3): 10 mg via ORAL
  Filled 2012-05-11 (×3): qty 2

## 2012-05-11 MED ORDER — MORPHINE SULFATE 4 MG/ML IJ SOLN: 4.0000 mg | INTRAMUSCULAR | Status: DC | PRN

## 2012-05-11 NOTE — Progress Notes (Signed)
[facility] PROGRESS NOTE  Paul Campos XBJ:[id number] DOB: 1990-09-07 DOA: 05/06/2012 PCP: Provider Not In System  Assessment/Plan: 1-sickle cell pain crisis: Improving.  -Continue IV fluids-change to hypotonic and increase to 150 mL per hour. -Pain management. -continue folic acid -follow at discharge at [facility] sickle cell clinic as he was doing PTA. 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 Does not require frequent transfusions-last transfusion when he was 22 years old. Mild back pain-worse at nights. Overall improving. - Hemoglobin stable. Leukocytosis resolved. Haptoglobin increased suggesting possible ongoing sickling. Reticulocyte count: 363  2-chest discomfort: without no acute abnormalities on CXR initially; but then demonstarted to be associated with PNA. No changes on EKG. Contributing factors are sickle cell crisis and GERD. -continue PPI -cont tx for PNa; looking for 24 hr w/o fever and 3 days of IV treatment before discharge. - No further chest pains.  3-HCAP: started on antibiotics (levaquin, aztreonam and vanc; day #2) -last fever was 3/17 at 4-5 PM (continue antipyretics) -Descalated antibiotics to oral Levaquin. Blood cultures x2 and urine cultures: Negative to date. - Overnight temperature 102F-DD sickle cell crisis versus pneumonia. Patient clinically does not look septic or toxic. Discussed with ID M.D. on call-advised continue Levaquin and monitor. Obviously if he continues to have fevers then may need further evaluation. Treat for sickle cell crisis.  4-Leukocytosis: due to pna. -Resolved.  5. Thrombocytopenia: Unclear etiology. Improved.  DVT: lovenox   Code Status: Full Family Communication: no family at bedside Disposition Plan: home when medically stable-Possibly 3/21   Consultants:  none  Procedures:  See below for x-ray reprots  Antibiotics:  Vanc, aztreonam 3/16 > 3/19  Po Levaquin 3/19 >  HPI/Subjective: Back  pain: 5-6/10 in the daytime, worsened to 10/10 at night he had had temperature spike of 102F overnight. Cough with intermittent mild brownish sputum. No dyspnea or chest pain. No pain elsewhere.   Objective: Filed Vitals:   05/10/12 2202 05/11/12 0546 05/11/12 1013 05/11/12 1400  BP: 134/61 121/73 111/50 117/52  Pulse: 101 58 80 70  Temp: 102 F (38.9 C) 98.3 F (36.8 C) 99.5 F (37.5 C) 98.4 F (36.9 C)  TempSrc: Oral Oral Oral Oral  Resp: 17 17 20 20   Height:      Weight: 78.608 kg (173 lb 4.8 oz)     SpO2: 99% 100% 97% 100%    Intake/Output Summary (Last 24 hours) at 05/11/12 1622 Last data filed at 05/11/12 1300  Gross per 24 hour  Intake 966.67 ml  Output    575 ml  Net 391.67 ml   Filed Weights   05/08/12 2026 05/09/12 2136 05/10/12 2202  Weight: 78.109 kg (172 lb 3.2 oz) 77.701 kg (171 lb 4.8 oz) 78.608 kg (173 lb 4.8 oz)    Exam:   General: Ambulating in room. Appears comfortable.   Cardiovascular: S1 and S2, no rubs or gallops  Respiratory:  clear to auscultation. No increased work of breathing.   Abdomen: Soft, nontender, nondistended, positive bowel sounds  Musculoskeletal: No joint swelling or; no cyanosis, clubbing or lower extremity edema  Neuro: non focal. Alert and oriented.  Data Reviewed: Basic Metabolic Panel:  Recent Labs Lab 05/05/12 1730 05/06/12 1741 05/07/12 0530  NA 137 141 139  K 4.6 4.3 4.0  CL 99 106 102  CO2 28  --  27  GLUCOSE 83 118* 111*  BUN 10 6 7   CREATININE 0.92 0.70 0.87  CALCIUM 9.7  --  8.8  MG  --   --  2.2   Liver Function Tests:  Recent Labs Lab 05/05/12 1730  AST 47*  ALT 35  ALKPHOS 69  BILITOT 2.6*  PROT 8.2  ALBUMIN 4.8   CBC:  Recent Labs Lab 05/05/12 1730  05/06/12 1820 05/07/12 0530 05/07/12 2221 05/10/12 1556 05/11/12 0522  WBC 12.0*  --  19.1* 18.6* 15.8* 8.0 9.6  NEUTROABS 7.4  --  14.9*  --  9.9*  --   --   HGB 10.2*  < > 10.1* 8.9* 8.9* 8.2* 8.9*  HCT 29.5*  < > 29.2* 25.3*  25.7* 24.0* 25.9*  MCV 72.7*  --  72.6* 72.3* 73.0* 72.7* 71.2*  PLT PLATELET CLUMPS NOTED ON SMEAR, COUNT APPEARS DECREASED  --  136* 159 119* 98* 123*  < > = values in this interval not displayed.    Recent Results (from the past 240 hour(s))  MRSA PCR SCREENING     Status: None   Collection Time    05/07/12 12:51 AM      Result Value Range Status   MRSA by PCR NEGATIVE  NEGATIVE Final   Comment:            The GeneXpert MRSA Assay (FDA     approved for NASAL specimens     only), is one component of a     comprehensive MRSA colonization     surveillance program. It is not     intended to diagnose MRSA     infection nor to guide or     monitor treatment for     MRSA infections.  CULTURE, BLOOD (ROUTINE X 2)     Status: None   Collection Time    05/07/12  9:11 PM      Result Value Range Status   Specimen Description BLOOD LEFT ARM   Final   Special Requests BOTTLES DRAWN AEROBIC ONLY 9CC   Final   Culture  Setup Time 05/08/2012 06:42   Final   Culture     Final   Value:        BLOOD CULTURE RECEIVED NO GROWTH TO DATE CULTURE WILL BE HELD FOR 5 DAYS BEFORE ISSUING A FINAL NEGATIVE REPORT   Report Status PENDING   Incomplete  CULTURE, BLOOD (ROUTINE X 2)     Status: None   Collection Time    05/07/12  9:15 PM      Result Value Range Status   Specimen Description BLOOD LEFT HAND   Final   Special Requests BOTTLES DRAWN AEROBIC ONLY 10CC   Final   Culture  Setup Time 05/08/2012 06:42   Final   Culture     Final   Value:        BLOOD CULTURE RECEIVED NO GROWTH TO DATE CULTURE WILL BE HELD FOR 5 DAYS BEFORE ISSUING A FINAL NEGATIVE REPORT   Report Status PENDING   Incomplete  URINE CULTURE     Status: None   Collection Time    05/07/12  9:51 PM      Result Value Range Status   Specimen Description URINE, CLEAN CATCH   Final   Special Requests NONE   Final   Culture  Setup Time 05/07/2012 22:33   Final   Colony Count NO GROWTH   Final   Culture NO GROWTH   Final   Report  Status 05/09/2012 FINAL   Final  CULTURE, EXPECTORATED SPUTUM-ASSESSMENT     Status: None   Collection Time    05/08/12 12:42 AM  Result Value Range Status   Specimen Description SPUTUM   Final   Special Requests NONE   Final   Sputum evaluation     Final   Value: MICROSCOPIC FINDINGS SUGGEST THAT THIS SPECIMEN IS NOT REPRESENTATIVE OF LOWER RESPIRATORY SECRETIONS. PLEASE RECOLLECT.     CALLED TO R.HILL RN 0142 05/08/12 E.GADDY   Report Status 05/08/2012 FINAL   Final     Studies: Dg Chest 2 View  05/11/2012  *RADIOLOGY REPORT*  Clinical Data: a fever and back pain.  CHEST - 2 VIEW  Comparison: 05/07/2012.  Findings: The cardiac silhouette, mediastinal and hilar contours are within normal limits and stable.  There are bibasilar infiltrates and atelectasis.  No definite pleural effusions.  The bony thorax is intact.  IMPRESSION: Bibasilar infiltrates and atelectasis.   Original Report Authenticated By: Rudie Meyer, M.D.     Scheduled Meds: . ALPRAZolam  0.25-0.5 mg Oral TID  . docusate sodium  100 mg Oral BID  . enoxaparin (LOVENOX) injection  40 mg Subcutaneous Q24H  . folic acid  1 mg Oral Daily  . levofloxacin  750 mg Oral QHS  . pantoprazole  40 mg Oral Daily  . sodium chloride  3 mL Intravenous Q12H   Continuous Infusions: . sodium chloride 150 mL/hr (05/11/12 1226)    Active Problems:   Sickle cell pain crisis   Chest pain    Time spent: >30 minutes    [facility]  [facility] Pager [phone]. If 7PM-7AM, please contact night-coverage at [email or web address], password [email or web address] 05/11/2012, 4:22 PM  LOS: 5 days

## 2012-05-12 MED ORDER — FOLIC ACID 1 MG PO TABS: 1.0000 mg | ORAL_TABLET | Freq: Every day | ORAL | Status: DC

## 2012-05-12 MED ORDER — OXYCODONE-ACETAMINOPHEN 5-325 MG PO TABS: 1.0000 | ORAL_TABLET | ORAL | Status: DC | PRN

## 2012-05-12 MED ORDER — LEVOFLOXACIN 750 MG PO TABS: 750.0000 mg | ORAL_TABLET | Freq: Every day | ORAL | Status: DC

## 2012-05-12 NOTE — Discharge Summary (Signed)
Physician Discharge Summary  Paul Campos ZOX:[id number] DOB: 19-Aug-1990 DOA: 05/06/2012  PCP: Provider Not In System  Admit date: 05/06/2012 Discharge date: 05/12/2012  Time spent: To less than 30 minutes  Recommendations for Outpatient Follow-up:  1. With Primary medical doctor (sickle cell M.D.) at [facility] in one week with repeat labs (CBC) 2. Recommend repeating chest x-ray in a couple of weeks to ensure resolution of pneumonia findings.  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 at [facility], recently incarcerated and missed MDs appointment. While he was incarcerated, he was treated with Tylenol #3 and Motrin. He was seen in the [facility] on 05/05/12 for sickle cell crisis and discharged back to police custody on Percocet. He did not fill that prescription and per patient when his father called the penitentiary, that prescription has since been destroyed. He was released from prison and presented again to the ED on 3/15 with back pain and right leg pain typical for his sickle cell exacerbation but also had some chest pain. He was admitted for further evaluation and management.  Hospital Course:  1. Sickle cell vaso-occlusive crisis: Patient was treated with IV fluids and pain medications. He gradually improved. He indicates that he has intermittent 5/10 back pain today and can manage this degree of pain at home with oral medications. He states he does not have a prescription or pain medications at home. He is advised to keep adequately hydrated and followup with his primary sickle cell M.D. at [facility] in 1 week. 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 Usually has back pain, chest pain and pain in one of the limbs during crisis. Hemoglobin stable. Reticulocyte count 363. 2. Chest pain: Resolved. No acute abnormalities on EKG and troponins negative. Patient has no symptoms suggestive of GERD.  3. Pneumonia-? HCAP: Patient started spiking fevers, had leukocytosis and cough with intermittent brown sputum. He was started empirically on broad-spectrum IV antibiotics-Levaquin, aztreonam and vancomycin. Cultures are negative to date. He was transitioned to oral Levaquin 48 hours ago. Subsequently he did spike a temperature of 102F but patient did not look septic or toxic. This may have been secondary to his sickle cell crisis. His temperatures have since defervesced. He is to complete total is 8 days antibiotics. Recommend repeating chest x-ray in a couple of weeks to ensure resolution. 4. Leukocytosis: Secondary to pneumonia or stress response from vaso-occlusive crisis. Resolved. 5. Thrombocytopenia:? Chronic. Stable.   Procedures:  None   Consultations:  None  Discharge Exam:  Complaints: Feels much better. Intermittent 5/10 back pain which he thinks he can manage at home with oral medications. No chest pain or pain elsewhere. Denies cough.   Filed Vitals:   05/11/12 1800 05/11/12 2133 05/12/12 0525 05/12/12 0925  BP: 129/68 117/67 123/61 126/60  Pulse: 77 69 50 66  Temp: 99.3 F (37.4 C) 99.3 F (37.4 C) 98.6 F (37 C) 98.4 F (36.9 C)  TempSrc: Oral Oral Oral   Resp: 20 18 17 18   Height:  Weight:  77.792 kg (171 lb 8 oz)    SpO2: 100% 100% 100% 100%     General exam: Comfortable.  Respiratory system: Clear. No increased work of breathing.  Cardiovascular system: S1 and S2 heard, RRR. No JVD, murmurs or pedal edema.  Gastrointestinal system: Abdomen is nondistended, soft and nontender. Normal bowel sounds  heard.  Central nervous system: Alert and oriented. No focal neurological deficits.  Extremities: Symmetric 5 x 5 power.  Discharge Instructions      Discharge Orders   Future Orders Complete By Expires     Call MD for:  difficulty breathing, headache or visual disturbances  As directed     Call MD for:  extreme fatigue  As directed     Call MD for:  persistant dizziness or light-headedness  As directed     Call MD for:  severe uncontrolled pain  As directed     Call MD for:  temperature >100.4  As directed     Diet general  As directed     Increase activity slowly  As directed         Medication List    STOP taking these medications       acetaminophen-codeine 300-30 MG per tablet  Commonly known as:  TYLENOL #3      TAKE these medications       folic acid 1 MG tablet  Commonly known as:  FOLVITE  Take 1 tablet (1 mg total) by mouth daily.     ibuprofen 200 MG tablet  Commonly known as:  ADVIL,MOTRIN  Take 200 mg by mouth every 6 (six) hours as needed for pain.     levofloxacin 750 MG tablet  Commonly known as:  LEVAQUIN  Take 1 tablet (750 mg total) by mouth at bedtime.     oxyCODONE-acetaminophen 5-325 MG per tablet  Commonly known as:  PERCOCET/ROXICET  Take 1 tablet by mouth every 4 (four) hours as needed for pain.       Follow-up Information   Follow up with Primary Medical Doctor(Sickle Cell MD) at [facility]. Schedule an appointment as soon as possible for a visit in 1 week. (To be seen with repeat labs (CBC))        The results of significant diagnostics from this hospitalization (including imaging, microbiology, ancillary and laboratory) are listed below for reference.    Significant Diagnostic Studies: Dg Chest 2 View  05/11/2012  *RADIOLOGY REPORT*  Clinical Data: a fever and back pain.  CHEST - 2 VIEW  Comparison: 05/07/2012.  Findings: The cardiac silhouette, mediastinal and hilar contours are within normal limits and stable.  There are  bibasilar infiltrates and atelectasis.  No definite pleural effusions.  The bony thorax is intact.  IMPRESSION: Bibasilar infiltrates and atelectasis.   Original Report Authenticated By: Rudie Meyer, M.D.    Dg Chest 2 View  05/06/2012  *RADIOLOGY REPORT*  Clinical Data: Shortness of breath and chest pain for 2 days. Nonsmoker.  CHEST - 2 VIEW  Comparison: 04/05/2010  Findings: Heart size is normal.  Lung volumes are low.  There is perihilar peribronchial change.  Bibasilar atelectasis is present. There are no focal consolidations or pleural effusions.  IMPRESSION:  1.  Low lung volumes. 2.  Bronchitic changes.   Original Report Authenticated By: Norva Pavlov, M.D.    Dg Chest Port 1 View  05/07/2012  *RADIOLOGY REPORT*  Clinical Data: Sudden onset of fever.  Sickle cell anemia.  PORTABLE CHEST - 1 VIEW  Comparison: 05/06/2012  Findings: Shallow inspiration.  Borderline heart size and pulmonary vascularity are likely normal for technique.  Infiltration or atelectasis in both lung bases.  No blunting of costophrenic angles.  No pneumothorax.  Mediastinal contours appear intact.  IMPRESSION: Shallow inspiration with infiltration or atelectasis in both lung bases.   Original Report Authenticated By: Burman Nieves, M.D.     Microbiology: Recent Results (from the past 240 hour(s))  MRSA PCR SCREENING     Status: None   Collection Time    05/07/12 12:51 AM      Result Value Range Status   MRSA by PCR NEGATIVE  NEGATIVE Final   Comment:            The GeneXpert MRSA Assay (FDA     approved for NASAL specimens     only), is one component of a     comprehensive MRSA colonization     surveillance program. It is not     intended to diagnose MRSA     infection nor to guide or     monitor treatment for     MRSA infections.  CULTURE, BLOOD (ROUTINE X 2)     Status: None   Collection Time    05/07/12  9:11 PM      Result Value Range Status   Specimen Description BLOOD LEFT ARM   Final   Special  Requests BOTTLES DRAWN AEROBIC ONLY 9CC   Final   Culture  Setup Time 05/08/2012 06:42   Final   Culture     Final   Value:        BLOOD CULTURE RECEIVED NO GROWTH TO DATE CULTURE WILL BE HELD FOR 5 DAYS BEFORE ISSUING A FINAL NEGATIVE REPORT   Report Status PENDING   Incomplete  CULTURE, BLOOD (ROUTINE X 2)     Status: None   Collection Time    05/07/12  9:15 PM      Result Value Range Status   Specimen Description BLOOD LEFT HAND   Final   Special Requests BOTTLES DRAWN AEROBIC ONLY 10CC   Final   Culture  Setup Time 05/08/2012 06:42   Final   Culture     Final   Value:        BLOOD CULTURE RECEIVED NO GROWTH TO DATE CULTURE WILL BE HELD FOR 5 DAYS BEFORE ISSUING A FINAL NEGATIVE REPORT   Report Status PENDING   Incomplete  URINE CULTURE     Status: None   Collection Time    05/07/12  9:51 PM      Result Value Range Status   Specimen Description URINE, CLEAN CATCH   Final   Special Requests NONE   Final   Culture  Setup Time 05/07/2012 22:33   Final   Colony Count NO GROWTH   Final   Culture NO GROWTH   Final   Report Status 05/09/2012 FINAL   Final  CULTURE, EXPECTORATED SPUTUM-ASSESSMENT     Status: None   Collection Time    05/08/12 12:42 AM      Result Value Range Status   Specimen Description SPUTUM   Final   Special Requests NONE   Final   Sputum evaluation     Final   Value: MICROSCOPIC FINDINGS SUGGEST THAT THIS SPECIMEN IS NOT REPRESENTATIVE OF LOWER RESPIRATORY SECRETIONS. PLEASE RECOLLECT.     CALLED TO R.HILL RN 0142 05/08/12 E.GADDY   Report Status 05/08/2012 FINAL   Final     Labs: Basic Metabolic  Panel:  Recent Labs Lab 05/05/12 1730 05/06/12 1741 05/07/12 0530  NA 137 141 139  K 4.6 4.3 4.0  CL 99 106 102  CO2 28  --  27  GLUCOSE 83 118* 111*  BUN 10 6 7   CREATININE 0.92 0.70 0.87  CALCIUM 9.7  --  8.8  MG  --   --  2.2   Liver Function Tests:  Recent Labs Lab 05/05/12 1730  AST 47*  ALT 35  ALKPHOS 69  BILITOT 2.6*  PROT 8.2  ALBUMIN  4.8   No results found for this basename: LIPASE, AMYLASE,  in the last 168 hours No results found for this basename: AMMONIA,  in the last 168 hours CBC:  Recent Labs Lab 05/05/12 1730  05/06/12 1820 05/07/12 0530 05/07/12 2221 05/10/12 1556 05/11/12 0522  WBC 12.0*  --  19.1* 18.6* 15.8* 8.0 9.6  NEUTROABS 7.4  --  14.9*  --  9.9*  --   --   HGB 10.2*  < > 10.1* 8.9* 8.9* 8.2* 8.9*  HCT 29.5*  < > 29.2* 25.3* 25.7* 24.0* 25.9*  MCV 72.7*  --  72.6* 72.3* 73.0* 72.7* 71.2*  PLT PLATELET CLUMPS NOTED ON SMEAR, COUNT APPEARS DECREASED  --  136* 159 119* 98* 123*  < > = values in this interval not displayed. Cardiac Enzymes: No results found for this basename: CKTOTAL, CKMB, CKMBINDEX, TROPONINI,  in the last 168 hours BNP: BNP (last 3 results) No results found for this basename: PROBNP,  in the last 8760 hours CBG: No results found for this basename: GLUCAP,  in the last 168 hours  Additional labs:    Signed:  Zayvien Campos  [facility] 05/12/2012, 12:06 PM

## 2012-05-12 NOTE — Progress Notes (Signed)
Visited with pt. Pt appeared bored and stated that he was about to be discharged. Checked in with pt on his experience during his stay. Pt stated that he was happy about his stay. Pt stated that he liked that the staff members were responsive to his needs and that he did not have to wait very long before he was helped.   Sherol Dade Counselor Intern Haroldine Laws

## 2012-05-12 NOTE — Progress Notes (Signed)
Pt ambulatory off unit with d/c papers.

## 2012-05-14 LAB — CULTURE, BLOOD (ROUTINE X 2): Culture: NO GROWTH

## 2012-05-15 NOTE — Progress Notes (Signed)
Agree 

## 2012-06-12 DIAGNOSIS — D696 Thrombocytopenia, unspecified: Secondary | ICD-10-CM | POA: Insufficient documentation

## 2012-08-16 ENCOUNTER — Encounter (HOSPITAL_COMMUNITY): Payer: Self-pay | Admitting: Emergency Medicine

## 2012-08-16 ENCOUNTER — Emergency Department (HOSPITAL_COMMUNITY): Payer: Medicaid Other

## 2012-08-16 ENCOUNTER — Inpatient Hospital Stay (HOSPITAL_COMMUNITY)
Admission: EM | Admit: 2012-08-16 | Discharge: 2012-08-22 | DRG: 811 | Disposition: A | Discharge status: Home / Self Care | Payer: Medicaid Other | Attending: Internal Medicine | Admitting: Internal Medicine

## 2012-08-16 DIAGNOSIS — D696 Thrombocytopenia, unspecified: Secondary | ICD-10-CM | POA: Diagnosis not present

## 2012-08-16 DIAGNOSIS — D5700 Hb-SS disease with crisis, unspecified: Secondary | ICD-10-CM | POA: Diagnosis present

## 2012-08-16 DIAGNOSIS — A419 Sepsis, unspecified organism: Secondary | ICD-10-CM | POA: Diagnosis present

## 2012-08-16 DIAGNOSIS — E876 Hypokalemia: Secondary | ICD-10-CM | POA: Diagnosis present

## 2012-08-16 DIAGNOSIS — R079 Chest pain, unspecified: Secondary | ICD-10-CM | POA: Diagnosis present

## 2012-08-16 DIAGNOSIS — J189 Pneumonia, unspecified organism: Secondary | ICD-10-CM | POA: Diagnosis present

## 2012-08-16 DIAGNOSIS — T45515A Adverse effect of anticoagulants, initial encounter: Secondary | ICD-10-CM | POA: Diagnosis present

## 2012-08-16 LAB — RETICULOCYTES
RBC.: 4.58 MIL/uL (ref 4.22–5.81)
Retic Count, Absolute: 279.4 10*3/uL — ABNORMAL HIGH (ref 19.0–186.0)
Retic Ct Pct: 6.1 % — ABNORMAL HIGH (ref 0.4–3.1)

## 2012-08-16 LAB — POCT I-STAT TROPONIN I: Troponin i, poc: 0.01 ng/mL (ref 0.00–0.08)

## 2012-08-16 LAB — CBC
MCV: 72.7 fL — ABNORMAL LOW (ref 78.0–100.0)
Platelets: 135 10*3/uL — ABNORMAL LOW (ref 150–400)
RBC: 4.58 MIL/uL (ref 4.22–5.81)
WBC: 5.4 10*3/uL (ref 4.0–10.5)

## 2012-08-16 LAB — BASIC METABOLIC PANEL
CO2: 26 mEq/L (ref 19–32)
Chloride: 103 mEq/L (ref 96–112)
Sodium: 139 mEq/L (ref 135–145)

## 2012-08-16 MED ORDER — FOLIC ACID 1 MG PO TABS
1.0000 mg | ORAL_TABLET | Freq: Every day | ORAL | Status: DC
  Administered 2012-08-17 – 2012-08-22 (×6): 1 mg via ORAL
  Filled 2012-08-16 (×6): qty 1

## 2012-08-16 MED ORDER — ONDANSETRON HCL 4 MG PO TABS
4.0000 mg | ORAL_TABLET | Freq: Four times a day (QID) | ORAL | Status: DC | PRN
  Administered 2012-08-17: 4 mg via ORAL
  Filled 2012-08-16: qty 1

## 2012-08-16 MED ORDER — MORPHINE SULFATE 4 MG/ML IJ SOLN
8.0000 mg | INTRAMUSCULAR | Status: DC | PRN
  Administered 2012-08-16 (×2): 8 mg via INTRAVENOUS
  Filled 2012-08-16 (×2): qty 2

## 2012-08-16 MED ORDER — SODIUM CHLORIDE 0.9 % IJ SOLN
3.0000 mL | Freq: Two times a day (BID) | INTRAMUSCULAR | Status: DC
  Administered 2012-08-17 – 2012-08-22 (×7): 3 mL via INTRAVENOUS

## 2012-08-16 MED ORDER — MORPHINE SULFATE 4 MG/ML IJ SOLN
6.0000 mg | INTRAMUSCULAR | Status: DC | PRN
  Administered 2012-08-16: 6 mg via INTRAVENOUS
  Filled 2012-08-16: qty 2

## 2012-08-16 MED ORDER — DIPHENHYDRAMINE HCL 50 MG/ML IJ SOLN
12.5000 mg | Freq: Four times a day (QID) | INTRAMUSCULAR | Status: DC | PRN
  Administered 2012-08-20 – 2012-08-22 (×3): 12.5 mg via INTRAVENOUS
  Filled 2012-08-16 (×3): qty 1

## 2012-08-16 MED ORDER — MORPHINE SULFATE (PF) 1 MG/ML IV SOLN
INTRAVENOUS | Status: DC
  Administered 2012-08-17 (×2): via INTRAVENOUS
  Administered 2012-08-17: 12 mg via INTRAVENOUS
  Administered 2012-08-17 (×2): via INTRAVENOUS
  Administered 2012-08-17: 25 mg via INTRAVENOUS
  Administered 2012-08-18: 10.5 mg via INTRAVENOUS
  Administered 2012-08-18 (×2): via INTRAVENOUS
  Filled 2012-08-16 (×7): qty 25

## 2012-08-16 MED ORDER — IBUPROFEN 200 MG PO TABS
200.0000 mg | ORAL_TABLET | Freq: Four times a day (QID) | ORAL | Status: DC | PRN
  Filled 2012-08-16: qty 1

## 2012-08-16 MED ORDER — LEVOFLOXACIN IN D5W 750 MG/150ML IV SOLN
750.0000 mg | INTRAVENOUS | Status: DC
  Administered 2012-08-16 – 2012-08-17 (×2): 750 mg via INTRAVENOUS
  Filled 2012-08-16 (×4): qty 150

## 2012-08-16 MED ORDER — ACETAMINOPHEN 650 MG RE SUPP: 650.0000 mg | Freq: Four times a day (QID) | RECTAL | Status: DC | PRN

## 2012-08-16 MED ORDER — ENOXAPARIN SODIUM 40 MG/0.4ML ~~LOC~~ SOLN
40.0000 mg | Freq: Every day | SUBCUTANEOUS | Status: DC
  Filled 2012-08-16 (×3): qty 0.4

## 2012-08-16 MED ORDER — DIPHENHYDRAMINE HCL 50 MG/ML IJ SOLN: 25.0000 mg | Freq: Once | INTRAMUSCULAR | Status: DC

## 2012-08-16 MED ORDER — SODIUM CHLORIDE 0.45 % IV SOLN
INTRAVENOUS | Status: AC
  Administered 2012-08-17 (×3): via INTRAVENOUS

## 2012-08-16 MED ORDER — SODIUM CHLORIDE 0.9 % IJ SOLN: 9.0000 mL | INTRAMUSCULAR | Status: DC | PRN

## 2012-08-16 MED ORDER — ONDANSETRON HCL 4 MG/2ML IJ SOLN
4.0000 mg | Freq: Four times a day (QID) | INTRAMUSCULAR | Status: DC | PRN
  Filled 2012-08-16: qty 2

## 2012-08-16 MED ORDER — ACETAMINOPHEN 325 MG PO TABS
650.0000 mg | ORAL_TABLET | Freq: Four times a day (QID) | ORAL | Status: DC | PRN
  Administered 2012-08-19 (×2): 650 mg via ORAL
  Filled 2012-08-16 (×2): qty 2

## 2012-08-16 MED ORDER — DIPHENHYDRAMINE HCL 12.5 MG/5ML PO ELIX
12.5000 mg | ORAL_SOLUTION | Freq: Four times a day (QID) | ORAL | Status: DC | PRN
  Administered 2012-08-17 (×2): 12.5 mg via ORAL
  Filled 2012-08-16 (×2): qty 5

## 2012-08-16 MED ORDER — NALOXONE HCL 0.4 MG/ML IJ SOLN: 0.4000 mg | INTRAMUSCULAR | Status: DC | PRN

## 2012-08-16 MED ORDER — ONDANSETRON HCL 4 MG/2ML IJ SOLN
4.0000 mg | Freq: Four times a day (QID) | INTRAMUSCULAR | Status: DC | PRN
  Administered 2012-08-17: 4 mg via INTRAVENOUS

## 2012-08-16 MED ORDER — SODIUM CHLORIDE 0.9 % IV BOLUS (SEPSIS)
1000.0000 mL | Freq: Once | INTRAVENOUS | Status: AC
  Administered 2012-08-16: 1000 mL via INTRAVENOUS

## 2012-08-16 MED ORDER — OXYCODONE-ACETAMINOPHEN 5-325 MG PO TABS
1.0000 | ORAL_TABLET | ORAL | Status: DC | PRN
  Administered 2012-08-17 – 2012-08-20 (×13): 1 via ORAL
  Filled 2012-08-16 (×13): qty 1

## 2012-08-16 MED ORDER — HYDROMORPHONE HCL PF 1 MG/ML IJ SOLN: 1.0000 mg | INTRAMUSCULAR | Status: DC | PRN

## 2012-08-16 NOTE — ED Notes (Signed)
[facility] ICU is unable to take report at this time, they states they do not have enough nurses to take report at this time and have called in additional staff to accept this pt

## 2012-08-16 NOTE — ED Notes (Signed)
[facility] ICU is unable to take report, the RN called in to care for this pt has not arrived yet

## 2012-08-16 NOTE — H&P (Addendum)
[facility] History and Physical  Paul Campos OZH:[id number] DOB: Jul 16, 1990 DOA: 08/16/2012  Referring physician: ER physician. PCP: Pcp Not In System [facility].  Chief Complaint: Chest pain.  HPI: Paul Campos is a 22 y.o. male history of sickle cell disease presented to the ER because of chest pain. Patient states that he has been having chest pain since morning. The pain is sharp and persistent increased on deep inspiration denies any associated fever chills shortness of breath or productive cough. In the ER chest x-ray and EKG were unremarkable troponins were negative. Patient has been admitted for sickle cell pain crisis. Patient has required multiple doses of morphine and probably will require PCA. Patient denies any nausea vomiting abdominal pain diarrhea.  Review of Systems: As presented in the history of presenting illness, rest negative.  Past Medical History  Diagnosis Date  . Sickle cell anemia   . Sickle cell anemia    Past Surgical History  Procedure Laterality Date  . Appendectomy     Social History:  reports that he has never smoked. He does not have any smokeless tobacco history on file. He reports that he does not drink alcohol or use illicit drugs. Home. where does patient live-- Can do ADLs. Can patient participate in ADLs?  Allergies  Allergen Reactions  . Dilaudid (Hydromorphone Hcl) Hives  . Hydromorphone Itching    Morphine okay.  . Cefotaxime Itching and Rash    Hives    Family History  Problem Relation Age of Onset  . Lung cancer Mother       Prior to Admission medications   Medication Sig Start Date End Date Taking? Authorizing Provider  folic acid (FOLVITE) 1 MG tablet Take 1 tablet (1 mg total) by mouth daily. 05/12/12  Yes Elease Etienne, MD  ibuprofen (ADVIL,MOTRIN) 200 MG tablet Take 200 mg by mouth every 6 (six) hours as needed for pain.   Yes Historical Provider, MD  oxyCODONE-acetaminophen (PERCOCET/ROXICET) 5-325 MG per  tablet Take 1 tablet by mouth every 4 (four) hours as needed for pain. 05/12/12  Yes Elease Etienne, MD   Physical Exam: Filed Vitals:   08/16/12 1730 08/16/12 1745 08/16/12 1830 08/16/12 1900  BP: 123/78 119/83 120/75 121/77  Pulse: 56 51 49 51  Temp:      TempSrc:      Resp: 12 17 11 18   SpO2: 98% 97% 99% 99%     General:  Well-developed well-nourished.  Eyes: Anicteric no pallor.  ENT: No discharge from the ears eyes nose mouth.  Neck: No mass felt.  Cardiovascular: S1-S2 heard.  Respiratory: No rhonchi or crepitations.  Abdomen: Soft nontender bowel sounds present.  Skin: No rash.  Musculoskeletal: No edema.  Psychiatric: Appears normal.  Neurologic: Alert oriented to time place and person. Moves all extremities.  Labs on Admission:  Basic Metabolic Panel:  Recent Labs Lab 08/16/12 1507  NA 139  K 5.0  CL 103  CO2 26  GLUCOSE 93  BUN 4*  CREATININE 0.85  CALCIUM 9.0   Liver Function Tests: No results found for this basename: AST, ALT, ALKPHOS, BILITOT, PROT, ALBUMIN,  in the last 168 hours No results found for this basename: LIPASE, AMYLASE,  in the last 168 hours No results found for this basename: AMMONIA,  in the last 168 hours CBC:  Recent Labs Lab 08/16/12 1507  WBC 5.4  HGB 11.5*  HCT 33.3*  MCV 72.7*  PLT 135*   Cardiac Enzymes: No results found for  this basename: CKTOTAL, CKMB, CKMBINDEX, TROPONINI,  in the last 168 hours  BNP (last 3 results) No results found for this basename: PROBNP,  in the last 8760 hours CBG: No results found for this basename: GLUCAP,  in the last 168 hours  Radiological Exams on Admission: Dg Chest 2 View  08/16/2012   *RADIOLOGY REPORT*  Clinical Data: Chest pain, sickle cell pain crisis  CHEST - 2 VIEW  Comparison: 05/11/2012  Findings: Improved basilar aeration but there is residual bilateral basilar scarring.  Normal heart size and vascularity.  No new acute airspace process, collapse, consolidation,  edema, effusion or pneumothorax.  Trachea is midline.  IMPRESSION: Chronic basilar scarring.  No superimposed acute process.   Original Report Authenticated By: Judie Petit. Paul Campos, M.D.    EKG: Independently reviewed. Normal sinus rhythm with early repolarization changes.  Assessment/Plan Principal Problem:   Sickle cell pain crisis Active Problems:   Chest pain   1. Chest pain probably secondary to sickle cell crisis - patient is not hypoxic or does not have any leukocytosis. Patient is mildly febrile chest x-ray does not show any new infiltrates so at this time I don't think patient has acute chest syndrome. But since patient is mildly febrile I have placed patient on Levaquin for now. Since patient is requiring multiple does of pain relief medications in short intervals I have placed patient on PCA. Check d-dimer. If positive with CT angiogram of the chest. 2. Anemia - from sickle cell disease. 3. Mild thrombocytopenia, chronic - follow CBC. Check d-dimer. If positive get a CT angiogram of the chest.  Patient is transferred to [facility] to step down unit. Accepting physician is Dr. Houston Campos. Patient is acceptable to transfer.  Code Status: Full code.  Family Communication: None.  Disposition Plan: Admit to inpatient.    Paul Campos N. [facility] Pager [phone].  If 7PM-7AM, please contact night-coverage [email or web address] Password [facility] 08/16/2012, 9:06 PM

## 2012-08-16 NOTE — Progress Notes (Signed)
ANTIBIOTIC CONSULT NOTE - INITIAL  Pharmacy Consult for Levaquin Indication: Sickle cell pain crisis, chest pain  Allergies  Allergen Reactions  . Dilaudid (Hydromorphone Hcl) Hives  . Hydromorphone Itching    Morphine okay.  . Cefotaxime Itching and Rash    Hives    Patient Measurements: Height: 6' 0.83" (185 cm) Weight: 171 lb 8.3 oz (77.8 kg) IBW/kg (Calculated) : 79.52 Adjusted Body Weight:   Vital Signs: Temp: 99.7 F (37.6 C) (06/25 1358) Temp src: Oral (06/25 1358) BP: 121/77 mmHg (06/25 1900) Pulse Rate: 51 (06/25 1900) Intake/Output from previous day:   Intake/Output from this shift:    Labs:  Recent Labs  08/16/12 1507  WBC 5.4  HGB 11.5*  PLT 135*  CREATININE 0.85   Estimated Creatinine Clearance: 152.5 ml/min (by C-G formula based on Cr of 0.85). No results found for this basename: VANCOTROUGH, VANCOPEAK, VANCORANDOM, GENTTROUGH, GENTPEAK, GENTRANDOM, TOBRATROUGH, TOBRAPEAK, TOBRARND, AMIKACINPEAK, AMIKACINTROU, AMIKACIN,  in the last 72 hours   Microbiology: No results found for this or any previous visit (from the past 720 hour(s)).  Medical History: Past Medical History  Diagnosis Date  . Sickle cell anemia   . Sickle cell anemia     Medications:  Scheduled:   Assessment: Pt is a 22 yr old male with sickle cell. He is having chest pain. He had a fever and a cough last week.   Goal of Therapy:  Resolution of infection.  Plan:  Levaquin 750 mg IV q24hrs.  Eugene Garnet 08/16/2012,9:32 PM

## 2012-08-16 NOTE — ED Notes (Signed)
Pt c/o generalized CP that feels like sickle cell pain starting this am; pt sts pain meds at home not helping

## 2012-08-16 NOTE — ED Provider Notes (Signed)
History    CSN: [id number] Arrival date & time 08/16/12  1330  None    Chief Complaint  Patient presents with  . Chest Pain  . Sickle Cell Pain Crisis   (Consider location/radiation/quality/duration/timing/severity/associated sxs/prior Treatment) Patient is a 22 y.o. male presenting with sickle cell pain.  Sickle Cell Pain Crisis Location:  Chest Severity:  Severe Onset quality:  Gradual Duration:  12 hours Similar to previous crisis episodes: yes (normally in legs and back, but has ho similar chest pain incidents)   Timing:  Constant Progression:  Unchanged Chronicity:  Recurrent Sickle cell genotype:  SS History of pulmonary emboli: no   Context: infection (last week)   Context: not non-compliance   Relieved by:  Nothing Worsened by:  Activity, movement and deep breathing Ineffective treatments:  Prescription drugs Associated symptoms: chest pain, cough (last week) and fever (last week)   Associated symptoms: no congestion, no headaches, no nausea, no shortness of breath, no sore throat and no vomiting   Risk factors: hx of pneumonia    Past Medical History  Diagnosis Date  . Sickle cell anemia   . Sickle cell anemia    Past Surgical History  Procedure Laterality Date  . Appendectomy     Family History  Problem Relation Age of Onset  . Lung cancer Mother    History  Substance Use Topics  . Smoking status: Never Smoker   . Smokeless tobacco: Not on file  . Alcohol Use: No    Review of Systems  Constitutional: Positive for fever (last week). Negative for chills.  HENT: Negative for congestion, sore throat and rhinorrhea.   Eyes: Negative for photophobia and visual disturbance.  Respiratory: Positive for cough (last week). Negative for shortness of breath.   Cardiovascular: Positive for chest pain. Negative for leg swelling.  Gastrointestinal: Negative for nausea, vomiting, abdominal pain, diarrhea and constipation.  Endocrine: Negative for polydipsia and  polyuria.  Genitourinary: Negative for dysuria and hematuria.  Musculoskeletal: Negative for back pain and arthralgias.  Skin: Negative for color change and rash.  Neurological: Negative for dizziness, syncope, light-headedness and headaches.  Hematological: Negative for adenopathy. Does not bruise/bleed easily.  All other systems reviewed and are negative.    Allergies  Dilaudid; Hydromorphone; and Cefotaxime  Home Medications   No current outpatient prescriptions on file. BP 126/77  Pulse 56  Temp(Src) 99.7 F (37.6 C) (Oral)  Resp 14  Ht 6' 0.84" (1.85 m)  Wt 171 lb 8.3 oz (77.8 kg)  BMI 22.73 kg/m2  SpO2 100% Physical Exam  Vitals reviewed. Constitutional: He is oriented to person, place, and time. He appears well-developed and well-nourished.  HENT:  Head: Normocephalic and atraumatic.  Eyes: Conjunctivae and EOM are normal.  Neck: Normal range of motion. Neck supple.  Cardiovascular: Normal rate, regular rhythm and normal heart sounds.   Pulmonary/Chest: Effort normal and breath sounds normal. No respiratory distress. He exhibits tenderness.  Abdominal: He exhibits no distension. There is no tenderness. There is no rebound and no guarding.  Musculoskeletal: Normal range of motion.  Neurological: He is alert and oriented to person, place, and time.  Skin: Skin is warm and dry.    ED Course  Procedures (including critical care time) Labs Reviewed  CBC - Abnormal; Notable for the following:    Hemoglobin 11.5 (*)    HCT 33.3 (*)    MCV 72.7 (*)    MCH 25.1 (*)    Platelets 135 (*)    All other  components within normal limits  BASIC METABOLIC PANEL - Abnormal; Notable for the following:    BUN 4 (*)    All other components within normal limits  RETICULOCYTES - Abnormal; Notable for the following:    Retic Ct Pct 6.1 (*)    Retic Count, Manual 279.4 (*)    All other components within normal limits  POCT I-STAT TROPONIN I   Results for orders placed during  the hospital encounter of 08/16/12  CBC      Result Value Range   WBC 5.4  4.0 - 10.5 K/uL   RBC 4.58  4.22 - 5.81 MIL/uL   Hemoglobin 11.5 (*) 13.0 - 17.0 g/dL   HCT 16.1 (*) 09.6 - 04.5 %   MCV 72.7 (*) 78.0 - 100.0 fL   MCH 25.1 (*) 26.0 - 34.0 pg   MCHC 34.5  30.0 - 36.0 g/dL   RDW 40.9  81.1 - 91.4 %   Platelets 135 (*) 150 - 400 K/uL  BASIC METABOLIC PANEL      Result Value Range   Sodium 139  135 - 145 mEq/L   Potassium 5.0  3.5 - 5.1 mEq/L   Chloride 103  96 - 112 mEq/L   CO2 26  19 - 32 mEq/L   Glucose, Bld 93  70 - 99 mg/dL   BUN 4 (*) 6 - 23 mg/dL   Creatinine, Ser 7.82  0.50 - 1.35 mg/dL   Calcium 9.0  8.4 - 95.6 mg/dL   GFR calc non Af Amer >90  >90 mL/min   GFR calc Af Amer >90  >90 mL/min  RETICULOCYTES      Result Value Range   Retic Ct Pct 6.1 (*) 0.4 - 3.1 %   RBC. 4.58  4.22 - 5.81 MIL/uL   Retic Count, Manual 279.4 (*) 19.0 - 186.0 K/uL  POCT I-STAT TROPONIN I      Result Value Range   Troponin i, poc 0.01  0.00 - 0.08 ng/mL   Comment 3             Dg Chest 2 View  08/16/2012   *RADIOLOGY REPORT*  Clinical Data: Chest pain, sickle cell pain crisis  CHEST - 2 VIEW  Comparison: 05/11/2012  Findings: Improved basilar aeration but there is residual bilateral basilar scarring.  Normal heart size and vascularity.  No new acute airspace process, collapse, consolidation, edema, effusion or pneumothorax.  Trachea is midline.  IMPRESSION: Chronic basilar scarring.  No superimposed acute process.   Original Report Authenticated By: Judie Petit. Shick, M.D.   1. Sickle cell pain crisis   2. Chest pain      Date: 08/16/2012  Rate: 64  Rhythm: normal sinus rhythm  QRS Axis: normal  Intervals: normal  ST/T Wave abnormalities: J point elevation in lateral leads consistent with early repol  Conduction Disutrbances:none  Narrative Interpretation:   Old EKG Reviewed: j point elevation not evident in prior ecg from 04/2012    MDM  22 y.o. male  with pertinent PMH of sickle  cell SS presents with chest pain beginning this am when pt awoke.  Has had antecedent cough, fever last week, however this has not been present over last 48 hours.  No other recent illness. No ho PE, acute chest, or other symptoms, however pt has had chest pain in the past without acute chest.  Physical exam as above with chest wall tenderness.  No tachycardia, leg swelling, or other concerning findings; consider symptoms likely sickle  cell rather than PE.  CXR as above without acute findings.  ECG as above.  Will check labs and treat symptomatically.  Labs unremarkable.  Symptoms refractory to multiple rounds of morphine.  Consulted hospitalist for admission.  Pt to be transferred to sickle cell unit.  EMTALA signed, accepting physician Dr. Conley Rolls.    Labs and imaging as above reviewed by myself and attending,Dr. Blinda Leatherwood, with whom case was discussed.   1. Sickle cell pain crisis   2. Chest pain          Noel Gerold, MD 08/16/12 [phone]

## 2012-08-17 ENCOUNTER — Encounter (HOSPITAL_COMMUNITY): Payer: Self-pay | Admitting: Radiology

## 2012-08-17 ENCOUNTER — Inpatient Hospital Stay (HOSPITAL_COMMUNITY): Payer: Medicaid Other

## 2012-08-17 LAB — CBC
HCT: 31.1 % — ABNORMAL LOW (ref 39.0–52.0)
Hemoglobin: 10.4 g/dL — ABNORMAL LOW (ref 13.0–17.0)
MCHC: 33.4 g/dL (ref 30.0–36.0)
WBC: 6.6 10*3/uL (ref 4.0–10.5)

## 2012-08-17 LAB — BASIC METABOLIC PANEL
BUN: 3 mg/dL — ABNORMAL LOW (ref 6–23)
Chloride: 104 mEq/L (ref 96–112)
GFR calc Af Amer: >90 mL/min (ref >90)
GFR calc non Af Amer: >90 mL/min (ref >90)
Potassium: 3.5 mEq/L (ref 3.5–5.1)
Sodium: 140 mEq/L (ref 135–145)

## 2012-08-17 LAB — D-DIMER, QUANTITATIVE: D-Dimer, Quant: 1.16 ug/mL-FEU — ABNORMAL HIGH (ref 0.00–0.48)

## 2012-08-17 LAB — TROPONIN I: Troponin I: <0.3 ng/mL (ref <0.30)

## 2012-08-17 LAB — MRSA PCR SCREENING: MRSA by PCR: NEGATIVE

## 2012-08-17 MED ORDER — IOHEXOL 350 MG/ML SOLN
100.0000 mL | Freq: Once | INTRAVENOUS | Status: AC | PRN
  Administered 2012-08-17: 100 mL via INTRAVENOUS

## 2012-08-17 MED ORDER — PROMETHAZINE HCL 25 MG/ML IJ SOLN
12.5000 mg | INTRAMUSCULAR | Status: DC | PRN
  Administered 2012-08-17: 12.5 mg via INTRAVENOUS
  Filled 2012-08-17: qty 1

## 2012-08-17 MED ORDER — PROMETHAZINE HCL 25 MG/ML IJ SOLN: 12.5000 mg | Freq: Four times a day (QID) | INTRAMUSCULAR | Status: DC | PRN

## 2012-08-17 NOTE — Progress Notes (Signed)
[facility] PROGRESS NOTE  Paul Campos ZOX:[id number] DOB: Feb 04, 1991 DOA: 08/16/2012 PCP: Pcp Not In System  Brief narrative: 22 y.o. male history of sickle cell disease presented to [facility] ED for chest pain for 1 day prior to this admission because of chest pain. Patient reported retrosternal chest pain worse with breathing, sharp, 10 out of 10 in intensity, nonradiating. Patient denied associated fever, chills or shortness of breath. D-dimer was elevated at 1.16. Chest x-ray showed chronic basilar scarring.  Assessment/Plan:  Principal Problem:   Sickle cell pain crisis - Pain regimen is with oxycodone every 4 hours as needed for moderate pain and morphine PCA Active Problems:   Chest pain - Cycle cardiac enzymes for 3 sets. - D-dimer elevated at 1.16. Obtain CT chest angio to rule out pulmonary embolism.   Code Status: full code Family Communication: no family at the bedside Disposition Plan: home when stale  Manson Passey, MD  [facility] Pager [phone]  If 7PM-7AM, please contact night-coverage [email or web address] Password [facility] 08/17/2012, 12:29 PM   LOS: 1 day   Consultants:  None   Procedures:  None   Antibiotics:  Levaquin 08/16/2012 -->  HPI/Subjective: No acute overnight events.  Objective: Filed Vitals:   08/17/12 1000 08/17/12 1100 08/17/12 1139 08/17/12 1200  BP:      Pulse: 58 51  59  Temp:    98.4 F (36.9 C)  TempSrc:    Oral  Resp: 18 10 14 17   Height:      Weight:      SpO2: 97% 98% 99% 97%    Intake/Output Summary (Last 24 hours) at 08/17/12 1229 Last data filed at 08/17/12 1200  Gross per 24 hour  Intake 2882.42 ml  Output   2325 ml  Net 557.42 ml    Exam:   General:  Pt is alert, follows commands appropriately, not in acute distress  Cardiovascular: Regular rate and rhythm, S1/S2, no murmurs, no rubs, no gallops  Respiratory: Clear to auscultation bilaterally, no wheezing, no crackles, no rhonchi  Abdomen: Soft, non tender, non  distended, bowel sounds present, no guarding  Extremities: No edema, pulses DP and PT palpable bilaterally  Neuro: Grossly nonfocal  Data Reviewed: Basic Metabolic Panel:  Recent Labs Lab 08/16/12 1507 08/17/12 0359  NA 139 140  K 5.0 3.5  CL 103 104  CO2 26 27  GLUCOSE 93 107*  BUN 4* 3*  CREATININE 0.85 0.83  CALCIUM 9.0 8.9   Liver Function Tests: No results found for this basename: AST, ALT, ALKPHOS, BILITOT, PROT, ALBUMIN,  in the last 168 hours No results found for this basename: LIPASE, AMYLASE,  in the last 168 hours No results found for this basename: AMMONIA,  in the last 168 hours CBC:  Recent Labs Lab 08/16/12 1507 08/17/12 0359  WBC 5.4 6.6  HGB 11.5* 10.4*  HCT 33.3* 31.1*  MCV 72.7* 72.7*  PLT 135* 129*   Cardiac Enzymes: No results found for this basename: CKTOTAL, CKMB, CKMBINDEX, TROPONINI,  in the last 168 hours BNP: No components found with this basename: POCBNP,  CBG: No results found for this basename: GLUCAP,  in the last 168 hours  Recent Results (from the past 240 hour(s))  MRSA PCR SCREENING     Status: None   Collection Time    08/16/12 11:48 PM      Result Value Range Status   MRSA by PCR NEGATIVE  NEGATIVE Final     Studies: Dg Chest 2 View 08/16/2012   *  IMPRESSION: Chronic basilar scarring.  No superimposed acute process.   Original Report Authenticated By: Judie Petit. Shick, M.D.    Scheduled Meds: . enoxaparin (LOVENOX)   40 mg Subcutaneous QHS  . folic acid  1 mg Oral Daily  . levofloxacin (LEVAQUIN)  750 mg Intravenous Q24H  . morphine   Intravenous Q4H   Continuous Infusions: . sodium chloride 125 mL/hr at 08/17/12 [phone]

## 2012-08-17 NOTE — Progress Notes (Addendum)
Pt with episode of vomiting clear emesis noted. Antiemetic given per Doctor order. Father at the bedside visiting patient. K Pad requested by patient to help with constant low back pain. Will continue to monitor patient per Doctor order and unit protocol.  08/18/12 0200am Pt refuses to keep on the CO2 detector and the pox sat indicator.  Pt often turns off channels to keep from the beeping noise. Will follow up with dayshift nurse.

## 2012-08-17 NOTE — Progress Notes (Signed)
CARE MANAGEMENT NOTE 08/17/2012  Patient:  Paul Campos, Paul Campos   Account Number:  [id number]  Date Initiated:  08/17/2012  Documentation initiated by:  Luddie Boghosian  Subjective/Objective Assessment:   hx of ssc, admitted with chest pain new onset, abnormal ekg, neg cardiac enz.'s,     Action/Plan:   home when cleared   Anticipated DC Date:  08/18/2012   Anticipated DC Plan:  HOME/SELF CARE  In-house referral  NA      DC Planning Services  NA      [facility] Choice  NA   Choice offered to / List presented to:  NA   DME arranged  NA      DME agency  NA     HH arranged  NA      HH agency  NA   Status of service:  In process, will continue to follow Medicare Important Message given?  NA - LOS <3 / Initial given by admissions (If response is "NO", the following Medicare IM given date fields will be blank) Date Medicare IM given:   Date Additional Medicare IM given:    Discharge Disposition:    Per UR Regulation:  Reviewed for med. necessity/level of care/duration of stay  If discussed at Long Length of Stay Meetings, dates discussed:    Comments:  [id number] Earlene Plater, RN, BSN, CCM:  CHART REVIEWED AND UPDATED.  Next chart review due on [id number]. NO DISCHARGE NEEDS PRESENT AT THIS TIME. CASE MANAGEMENT [phone]

## 2012-08-17 NOTE — ED Provider Notes (Signed)
I saw and evaluated the patient, reviewed the resident's note and I agree with the findings and plan.   Patient presents with pain across the chest area consistent with previous sickle cell crisis episodes. No evidence of acute chest seen on workup. Patient not experiencing any significant relief with multiple doses of medications, to be admitted to the hospital for further management.  Gilda Crease, MD 08/17/12 [phone]

## 2012-08-18 DIAGNOSIS — J189 Pneumonia, unspecified organism: Secondary | ICD-10-CM | POA: Diagnosis present

## 2012-08-18 LAB — TROPONIN I: Troponin I: <0.3 ng/mL (ref <0.30)

## 2012-08-18 MED ORDER — LEVOFLOXACIN 750 MG PO TABS
750.0000 mg | ORAL_TABLET | ORAL | Status: DC
  Administered 2012-08-18: 750 mg via ORAL
  Filled 2012-08-18 (×2): qty 1

## 2012-08-18 MED ORDER — MORPHINE SULFATE 4 MG/ML IJ SOLN
4.0000 mg | INTRAMUSCULAR | Status: DC | PRN
  Administered 2012-08-18 – 2012-08-19 (×13): 6 mg via INTRAVENOUS
  Administered 2012-08-20: 4 mg via INTRAVENOUS
  Administered 2012-08-20 (×6): 6 mg via INTRAVENOUS
  Administered 2012-08-20: 4 mg via INTRAVENOUS
  Administered 2012-08-20: 6 mg via INTRAVENOUS
  Administered 2012-08-20: 4 mg via INTRAVENOUS
  Administered 2012-08-20: 6 mg via INTRAVENOUS
  Administered 2012-08-21: 4 mg via INTRAVENOUS
  Administered 2012-08-21 (×3): 6 mg via INTRAVENOUS
  Filled 2012-08-18 (×2): qty 2
  Filled 2012-08-18 (×2): qty 1
  Filled 2012-08-18 (×4): qty 2
  Filled 2012-08-18: qty 1
  Filled 2012-08-18 (×2): qty 2
  Filled 2012-08-18: qty 1
  Filled 2012-08-18 (×19): qty 2

## 2012-08-18 MED ORDER — MORPHINE SULFATE 4 MG/ML IJ SOLN
4.0000 mg | INTRAMUSCULAR | Status: DC | PRN
  Administered 2012-08-18 (×2): 4 mg via INTRAVENOUS
  Filled 2012-08-18 (×2): qty 1

## 2012-08-18 MED ORDER — MORPHINE SULFATE 2 MG/ML IJ SOLN
2.0000 mg | INTRAMUSCULAR | Status: DC | PRN
  Administered 2012-08-18: 2 mg via INTRAVENOUS
  Filled 2012-08-18: qty 1

## 2012-08-18 NOTE — Progress Notes (Signed)
ANTIBIOTIC CONSULT NOTE - FOLLOW UP  Pharmacy Consult for Levaquin Indication: pneumonia  Allergies  Allergen Reactions  . Dilaudid (Hydromorphone Hcl) Hives  . Hydromorphone Itching    Morphine okay.  . Cefotaxime Itching Paul Rash    Hives    Patient Measurements: Height: 6\' 1"  (185.4 cm) Weight: 178 lb 9.2 oz (81 kg) IBW/kg (Calculated) : 79.9  Vital Signs: Temp: 99.9 F (37.7 C) (06/27 0519) Temp src: Oral (06/27 0519) BP: 138/85 mmHg (06/27 0519) Pulse Rate: 101 (06/27 0519) Intake/Output from previous day: 06/26 0701 - 06/27 0700 In: 2730 [P.O.:480; I.V.:2250] Out: 1700 [Urine:1200; Emesis/NG output:500] Intake/Output from this shift:    Labs:  Recent Labs  08/16/12 1507 08/17/12 0359  WBC 5.4 6.6  HGB 11.5* 10.4*  PLT 135* 129*  CREATININE 0.85 0.83   Estimated Creatinine Clearance: 160.4 ml/min (by C-G formula based on Cr of 0.83). No results found for this basename: VANCOTROUGH, Leodis Binet, VANCORANDOM, GENTTROUGH, GENTPEAK, GENTRANDOM, TOBRATROUGH, TOBRAPEAK, TOBRARND, AMIKACINPEAK, AMIKACINTROU, AMIKACIN,  in the last 72 hours   Microbiology: Recent Results (from the past 720 hour(s))  MRSA PCR SCREENING     Status: None   Collection Time    08/16/12 11:48 PM      Result Value Range Status   MRSA by PCR NEGATIVE  NEGATIVE Final   Comment:            The GeneXpert MRSA Assay (FDA     approved for NASAL specimens     only), is one component of a     comprehensive MRSA colonization     surveillance program. It is not     intended to diagnose MRSA     infection nor to guide or     monitor treatment for     MRSA infections.    Anti-infectives   Start     Dose/Rate Route Frequency Ordered Stop   08/16/12 2200  levofloxacin (LEVAQUIN) IVPB 750 mg     750 mg 100 mL/hr over 90 Minutes Intravenous Every 24 hours 08/16/12 2126        Paul Campos, Paul Campos, Paul pneumonia.  Day #3 Levaquin 750 mg IV  q24h.  Tmax 99.9, WBC WNL, Renal function stable.  Will transition patient to PO Levaquin based on criteria approved by the Pharmacy Paul Therapeutics Committee, Paul the Infectious Disease Division:   .  Patient being treated for a respiratory tract infection, urinary tract infection, cellulitis, or Clostridium Difficile Associated Diarrhea . The patient is not neutropenic Paul does not exhibit a GI malabsorption state . The patient is eating (either orally or per tube) Paul/or has been taking other orally administered medications for at least 24 hours. . The patient is improving clinically (physician assessment Paul a 24-hour Tmax of < 100.5 F).  Goal of Therapy:  doses adjusted per renal clearance  Plan:  Levaquin 750 mg PO q24h. F/u LOT.  Clance Boll 08/18/2012,11:42 AM

## 2012-08-18 NOTE — Progress Notes (Signed)
[facility] PROGRESS NOTE  Macarius Ruark WGN:[id number] DOB: 1991/01/12 DOA: 08/16/2012 PCP: Pcp Not In System  Brief narrative: 22 y.o. male history of sickle cell disease presented to [facility] ED for chest pain for 1 day prior to this admission because of chest pain. Patient reported retrosternal chest pain worse with breathing, sharp, 10 out of 10 in intensity, nonradiating. Patient denied associated fever, chills or shortness of breath. D-dimer was elevated at 1.16. Chest x-ray showed chronic basilar scarring.    Assessment/Plan:   Principal Problem:  Sickle cell pain crisis  - Pain regimen is with oxycodone every 4 hours as needed for moderate pain and morphine PCA. Marland Kitchen Morphine PCA today. Transitioned to morphine 2 mg every 2 hours IV as needed for severe pain.  Active Problems:  Chest pain  - Cycle cardiac enzymes for 3 sets - all negative - D-dimer elevated at 1.16. Obtained CT chest angio which ruled out pulmonary embolism.  Community-acquired pneumonia - Right lower lobe - Continue Levaquin daily  Code Status: full code  Family Communication: no family at the bedside  Disposition Plan: home when stale   Manson Passey, MD  [facility]  Pager [phone]   Consultants:  None  Procedures:  None  Antibiotics:  Levaquin 08/16/2012 -->  If 7PM-7AM, please contact night-coverage [email or web address] Password [facility] 08/18/2012, 11:21 AM   LOS: 2 days    HPI/Subjective: Complains of generalized pain.  Objective: Filed Vitals:   08/17/12 1847 08/17/12 2053 08/18/12 0058 08/18/12 0519  BP:  134/76  138/85  Pulse:  106  101  Temp:  99.9 F (37.7 C)  99.9 F (37.7 C)  TempSrc:  Oral  Oral  Resp: 22 22 22 24   Height:      Weight:      SpO2: 96% 93% 96% 100%    Intake/Output Summary (Last 24 hours) at 08/18/12 1121 Last data filed at 08/18/12 0700  Gross per 24 hour  Intake   2230 ml  Output    900 ml  Net   1330 ml    Exam:   General:  Pt is alert, follows commands  appropriately, not in acute distress  Cardiovascular: Regular rate and rhythm, S1/S2, no murmurs, no rubs, no gallops  Respiratory: Clear to auscultation bilaterally, no wheezing, no crackles, no rhonchi  Abdomen: Soft, non tender, non distended, bowel sounds present, no guarding  Extremities: No edema, pulses DP and PT palpable bilaterally  Neuro: Grossly nonfocal  Data Reviewed: Basic Metabolic Panel:  Recent Labs Lab 08/16/12 1507 08/17/12 0359  NA 139 140  K 5.0 3.5  CL 103 104  CO2 26 27  GLUCOSE 93 107*  BUN 4* 3*  CREATININE 0.85 0.83  CALCIUM 9.0 8.9   Liver Function Tests: No results found for this basename: AST, ALT, ALKPHOS, BILITOT, PROT, ALBUMIN,  in the last 168 hours No results found for this basename: LIPASE, AMYLASE,  in the last 168 hours No results found for this basename: AMMONIA,  in the last 168 hours CBC:  Recent Labs Lab 08/16/12 1507 08/17/12 0359  WBC 5.4 6.6  HGB 11.5* 10.4*  HCT 33.3* 31.1*  MCV 72.7* 72.7*  PLT 135* 129*   Cardiac Enzymes:  Recent Labs Lab 08/17/12 1514 08/17/12 1807 08/18/12 0045  TROPONINI <0.30 <0.30 <0.30   BNP: No components found with this basename: POCBNP,  CBG: No results found for this basename: GLUCAP,  in the last 168 hours  Recent Results (from the past 240 hour(s))  MRSA  PCR SCREENING     Status: None   Collection Time    08/16/12 11:48 PM      Result Value Range Status   MRSA by PCR NEGATIVE  NEGATIVE Final     Studies: Dg Chest 2 View 08/16/2012   * IMPRESSION: Chronic basilar scarring.  No superimposed acute process.     Ct Angio Chest Pe W/cm &/or Wo Cm 08/17/2012    IMPRESSION:  1.  No evidence of pulmonary embolism to the proximal segmental level. 2.  Bilateral lower lobe linear atelectasis slightly greater on the right than the left. 3.  There may be a small amount of superimposed consolidation in the right lower lobe.  Recommend clinical correlation for signs and symptoms of  bronchopneumonia.  4.  Mild cardiomegaly likely secondary to high cardiac output related to chronic anemia 5.  Incompletely imaged but likely enlarged spleen 6.  Cholelithiasis 7.  Diffuse mottled appearance of the bones with a characteristic endplate abnormalities and humeral head sclerosis consistent with the stigmata of sickle cell anemia.      Scheduled Meds: . enoxaparin (LOVENOX) injection  40 mg Subcutaneous QHS  . folic acid  1 mg Oral Daily  . levofloxacin (LEVAQUIN) IV  750 mg Intravenous Q24H  . sodium chloride  3 mL Intravenous Q12H   Continuous Infusions:

## 2012-08-19 ENCOUNTER — Inpatient Hospital Stay (HOSPITAL_COMMUNITY): Payer: Medicaid Other

## 2012-08-19 DIAGNOSIS — D696 Thrombocytopenia, unspecified: Secondary | ICD-10-CM | POA: Diagnosis not present

## 2012-08-19 DIAGNOSIS — J189 Pneumonia, unspecified organism: Secondary | ICD-10-CM

## 2012-08-19 LAB — CBC WITH DIFFERENTIAL/PLATELET
Basophils Absolute: 0 10*3/uL (ref 0.0–0.1)
Basophils Relative: 0 % (ref 0–1)
Eosinophils Absolute: 0.1 10*3/uL (ref 0.0–0.7)
HCT: 25.7 % — ABNORMAL LOW (ref 39.0–52.0)
Hemoglobin: 8.7 g/dL — ABNORMAL LOW (ref 13.0–17.0)
MCH: 24.4 pg — ABNORMAL LOW (ref 26.0–34.0)
MCHC: 33.9 g/dL (ref 30.0–36.0)
Monocytes Absolute: 2.7 10*3/uL — ABNORMAL HIGH (ref 0.1–1.0)
Monocytes Relative: 16 % — ABNORMAL HIGH (ref 3–12)
Neutrophils Relative %: 74 % (ref 43–77)
RDW: 15.3 % (ref 11.5–15.5)

## 2012-08-19 LAB — URINE MICROSCOPIC-ADD ON

## 2012-08-19 LAB — URINALYSIS, ROUTINE W REFLEX MICROSCOPIC
Bilirubin Urine: NEGATIVE
Ketones, ur: NEGATIVE mg/dL
Nitrite: NEGATIVE
Protein, ur: NEGATIVE mg/dL
Urobilinogen, UA: 0.2 mg/dL (ref 0.0–1.0)

## 2012-08-19 MED ORDER — LEVOFLOXACIN IN D5W 750 MG/150ML IV SOLN
750.0000 mg | INTRAVENOUS | Status: DC
  Administered 2012-08-19: 750 mg via INTRAVENOUS
  Filled 2012-08-19: qty 150

## 2012-08-19 NOTE — Progress Notes (Signed)
[facility] PROGRESS NOTE  Paul Campos ZOX:[id number] DOB: [birth year] DOA: 08/16/2012 PCP: Pcp Not In System  Brief narrative: 22 y.o. male history of sickle cell disease presented to [facility] ED for chest pain for 1 day prior to this admission because of chest pain. Patient reported retrosternal chest pain worse with breathing, sharp, 10 out of 10 in intensity, nonradiating. Patient denied associated fever, chills or shortness of breath. D-dimer was elevated at 1.16. Chest x-ray showed chronic basilar scarring.   Assessment/Plan:   Principal Problem:  Sickle cell pain crisis  - Pain regimen is with morphine 4-6 mg every 2 hours IV PRN severe pain and oxycodone every 4 hours PO as needed for moderate pain   Active Problems:  Chest pain  - Cycle cardiac enzymes for 3 sets - all negative  - D-dimer elevated at 1.16. Obtained CT chest angio which ruled out pulmonary embolism.  Community-acquired pneumonia  - Right lower lobe pneumonia; patient spike fever overnight 101.2 F - follow up blood cultures, CXR - Continue Levaquin daily  Leukocytosis - likely due to pneumonia - will follow up WBC count in am - will continue Levaquin for now Thrombocytopenia - likely due to Lovenox - Lovenox discontinued; use SCD's bilaterally Sickle cell anemia - hemoglobin stable at 8.7 - no indications for transfusion  Code Status: full code  Family Communication: no family at the bedside  Disposition Plan: home when stale   Manson Passey, MD  [facility]  Pager [phone]   Consultants:  None  Procedures:  None  Antibiotics:  Levaquin 08/16/2012 -->   If 7PM-7AM, please contact night-coverage [email or web address] Password [facility] 08/19/2012, 6:38 AM   LOS: 3 days    HPI/Subjective: Complains of pain.  Objective: Filed Vitals:   08/18/12 1539 08/18/12 2104 08/19/12 0519 08/19/12 0630  BP: 132/74 133/77 133/76   Pulse: 100 76 102   Temp: 99 F (37.2 C) 99.9 F (37.7 C) 100.2 F (37.9 C) 101.2 F  (38.4 C)  TempSrc: Oral Oral Oral Oral  Resp: 20 19 19    Height:      Weight:      SpO2: 100% 96% 97%     Intake/Output Summary (Last 24 hours) at 08/19/12 8295 Last data filed at 08/18/12 1911  Gross per 24 hour  Intake 2586.75 ml  Output   1450 ml  Net 1136.75 ml    Exam:   General:  Pt is alert, follows commands appropriately, not in acute distress  Cardiovascular: Regular rate and rhythm, S1/S2, no murmurs, no rubs, no gallops  Respiratory: Clear to auscultation bilaterally, no wheezing, no crackles, no rhonchi  Abdomen: Soft, non tender, non distended, bowel sounds present, no guarding  Extremities: No edema, pulses DP and PT palpable bilaterally  Neuro: Grossly nonfocal  Data Reviewed: Basic Metabolic Panel:  Recent Labs Lab 08/16/12 1507 08/17/12 0359  NA 139 140  K 5.0 3.5  CL 103 104  CO2 26 27  GLUCOSE 93 107*  BUN 4* 3*  CREATININE 0.85 0.83  CALCIUM 9.0 8.9   Liver Function Tests: No results found for this basename: AST, ALT, ALKPHOS, BILITOT, PROT, ALBUMIN,  in the last 168 hours No results found for this basename: LIPASE, AMYLASE,  in the last 168 hours No results found for this basename: AMMONIA,  in the last 168 hours CBC:  Recent Labs Lab 08/16/12 1507 08/17/12 0359  WBC 5.4 6.6  HGB 11.5* 10.4*  HCT 33.3* 31.1*  MCV 72.7* 72.7*  PLT 135* 129*  Cardiac Enzymes:  Recent Labs Lab 08/17/12 1514 08/17/12 1807 08/18/12 0045 08/18/12 1155  TROPONINI <0.30 <0.30 <0.30 <0.30   BNP: No components found with this basename: POCBNP,  CBG: No results found for this basename: GLUCAP,  in the last 168 hours  MRSA PCR SCREENING     Status: None   Collection Time    08/16/12 11:48 PM      Result Value Range Status   MRSA by PCR NEGATIVE  NEGATIVE Final     Studies: Ct Angio Chest Pe W/cm &/or Wo Cm 08/17/2012 IMPRESSION:  1.  No evidence of pulmonary embolism to the proximal segmental level. 2.  Bilateral lower lobe linear  atelectasis slightly greater on the right than the left. 3.  There may be a small amount of superimposed consolidation in the right lower lobe.  Recommend clinical correlation for signs and symptoms of bronchopneumonia.  4.  Mild cardiomegaly likely secondary to high cardiac output related to chronic anemia 5.  Incompletely imaged but likely enlarged spleen 6.  Cholelithiasis 7.  Diffuse mottled appearance of the bones with a characteristic endplate abnormalities and humeral head sclerosis consistent with the stigmata of sickle cell anemia.   Original Report Authenticated By: Malachy Moan, M.D.    Scheduled Meds: . folic acid  1 mg Oral Daily  . levofloxacin  750 mg Oral Q24H

## 2012-08-20 DIAGNOSIS — D696 Thrombocytopenia, unspecified: Secondary | ICD-10-CM

## 2012-08-20 LAB — URINE CULTURE

## 2012-08-20 LAB — PROCALCITONIN: Procalcitonin: 0.58 ng/mL

## 2012-08-20 MED ORDER — VANCOMYCIN HCL IN DEXTROSE 1-5 GM/200ML-% IV SOLN
1000.0000 mg | Freq: Three times a day (TID) | INTRAVENOUS | Status: DC
  Administered 2012-08-20 – 2012-08-22 (×6): 1000 mg via INTRAVENOUS
  Filled 2012-08-20 (×7): qty 200

## 2012-08-20 MED ORDER — OXYCODONE-ACETAMINOPHEN 5-325 MG PO TABS
2.0000 | ORAL_TABLET | ORAL | Status: DC | PRN
  Administered 2012-08-20 – 2012-08-22 (×12): 2 via ORAL
  Filled 2012-08-20 (×12): qty 2

## 2012-08-20 MED ORDER — PIPERACILLIN-TAZOBACTAM 3.375 G IVPB
3.3750 g | Freq: Three times a day (TID) | INTRAVENOUS | Status: DC
  Administered 2012-08-20 – 2012-08-22 (×6): 3.375 g via INTRAVENOUS
  Filled 2012-08-20 (×7): qty 50

## 2012-08-20 NOTE — Progress Notes (Signed)
ANTIBIOTIC CONSULT NOTE - INITIAL  Pharmacy Consult for Vancomycin/Zosyn Indication: Sepsis  Allergies  Allergen Reactions  . Dilaudid (Hydromorphone Hcl) Hives  . Hydromorphone Itching    Morphine okay.  . Cefotaxime Itching and Rash    Hives    Patient Measurements: Height: 6\' 1"  (185.4 cm) Weight: 178 lb 9.2 oz (81 kg) IBW/kg (Calculated) : 79.9 Adjusted Body Weight:   Vital Signs: Temp: 99.9 F (37.7 C) (06/29 0635) Temp src: Oral (06/29 0635) BP: 120/78 mmHg (06/29 0635) Pulse Rate: 111 (06/29 0635) Intake/Output from previous day: 06/28 0701 - 06/29 0700 In: 360 [P.O.:360] Out: 1650 [Urine:1650] Intake/Output from this shift: Total I/O In: 240 [P.O.:240] Out: 300 [Urine:300]  Labs:  Recent Labs  08/19/12 0750  WBC 17.5*  HGB 8.7*  PLT 76*   Estimated Creatinine Clearance: 160.4 ml/min (by C-G formula based on Cr of 0.83). No results found for this basename: VANCOTROUGH, Leodis Binet, VANCORANDOM, GENTTROUGH, GENTPEAK, GENTRANDOM, TOBRATROUGH, TOBRAPEAK, TOBRARND, AMIKACINPEAK, AMIKACINTROU, AMIKACIN,  in the last 72 hours   Microbiology: Recent Results (from the past 720 hour(s))  MRSA PCR SCREENING     Status: None   Collection Time    08/16/12 11:48 PM      Result Value Range Status   MRSA by PCR NEGATIVE  NEGATIVE Final   Comment:            The GeneXpert MRSA Assay (FDA     approved for NASAL specimens     only), is one component of a     comprehensive MRSA colonization     surveillance program. It is not     intended to diagnose MRSA     infection nor to guide or     monitor treatment for     MRSA infections.    Medical History: Past Medical History  Diagnosis Date  . Sickle cell anemia   . Sickle cell anemia     Medications:  Scheduled:  . folic acid  1 mg Oral Daily  . piperacillin-tazobactam (ZOSYN)  IV  3.375 g Intravenous Q8H  . sodium chloride  3 mL Intravenous Q12H  . vancomycin  1,000 mg Intravenous Q8H   Infusions:    PRN: acetaminophen, acetaminophen, diphenhydrAMINE, diphenhydrAMINE, ibuprofen, morphine injection, naloxone, ondansetron (ZOFRAN) IV, oxyCODONE-acetaminophen, promethazine, sodium chloride Assessment: 22 yo M SSC patient. Stopping Levaquin and starting Vanc/Zosyn for possible sepsis  Goal of Therapy:  Vancomycin trough level 15-20 mcg/ml  Plan:  1.  Since patient is young, good CrCl, etc, will begin with Vancomycin 1000mg  IV q 8 hours and follow up with levels at steady state. 2. Zosyn 3.375 Gm IV q 8 hours- EI Measure antibiotic drug levels at steady state Follow up culture results  Loletta Specter 08/20/2012,10:55 AM

## 2012-08-20 NOTE — Progress Notes (Signed)
[facility] PROGRESS NOTE  Paul Campos ZOX:[id number] DOB: 1990/06/25 DOA: 08/16/2012 PCP: Pcp Not In System  Brief narrative: 22 y.o. male history of sickle cell disease presented to [facility] ED for chest pain for 1 day prior to this admission because of chest pain. Patient reported retrosternal chest pain worse with breathing, sharp, 10 out of 10 in intensity, nonradiating. Patient denied associated fever, chills or shortness of breath. D-dimer was elevated at 1.16. Chest x-ray showed chronic basilar scarring.  Hospital course is complicated due to ongoing fever despite antibiotics. We have stopped Levaquin and started vanco and zosyn for possible pneumonia while we are awaiting procalcitonin level.  Assessment/Plan:   Principal Problem:  Sickle cell pain crisis  - Pain regimen is with morphine 4-6 mg every 2 hours IV PRN severe pain and percocet every 4 hours PO as needed for moderate pain (use 2 tablets of percocet for better pain relief) Active Problems:  Chest pain  - Cardiac enzymes for 3 sets - all negative  - D-dimer elevated at 1.16. Obtained CT chest angio which ruled out pulmonary embolism.  Community-acquired pneumonia  - Right lower lobe pneumonia; patient spiked fever overnight, 100.51F - CXR with increased basilar atelectasis - stop Levaquin and start vanco and zosyn for possible sepsis - follow up procalcitonin level - follow up blood culture results Leukocytosis  - likely due to pneumonia  - WBC 17.5 today - antibiotics changed to vanco and zosyn for possible sepsis - levaquin discontinued Thrombocytopenia  - likely due to Lovenox  - Lovenox discontinued; use SCD's bilaterally  - check CBC in am Sickle cell anemia  - hemoglobin stable at 8.7  - no indications for transfusion   Code Status: full code  Family Communication: no family at the bedside  Disposition Plan: home when stale   Manson Passey, MD  [facility]  Pager [phone]   Consultants:  None   Procedures:  None  Antibiotics:  Levaquin 08/16/2012 --> 08/20/2012 Vanco 08/20/2012 --> Zosyn 08/20/2012 -->  If 7PM-7AM, please contact night-coverage [email or web address] Password [facility] 08/20/2012, 10:17 AM   LOS: 4 days    HPI/Subjective: Continues to spike fever even while on IV levaquin.  Objective: Filed Vitals:   08/19/12 0930 08/19/12 1430 08/19/12 2144 08/20/12 0635  BP:  135/76 137/79 120/78  Pulse:  104 99 111  Temp: 99.2 F (37.3 C) 100 F (37.8 C) 100.5 F (38.1 C) 99.9 F (37.7 C)  TempSrc:  Oral Oral Oral  Resp:  18 18 18   Height:      Weight:      SpO2:  97% 99% 100%    Intake/Output Summary (Last 24 hours) at 08/20/12 1017 Last data filed at 08/20/12 0900  Gross per 24 hour  Intake    360 ml  Output   1500 ml  Net  -1140 ml    Exam:   General:  Pt is alert, follows commands appropriately, not in acute distress  Cardiovascular: Regular rate and rhythm, S1/S2, no murmurs, no rubs, no gallops  Respiratory: Clear to auscultation bilaterally, no wheezing, no crackles, no rhonchi  Abdomen: Soft, non tender, non distended, bowel sounds present, no guarding  Extremities: No edema, pulses DP and PT palpable bilaterally  Neuro: Grossly nonfocal  Data Reviewed: Basic Metabolic Panel:  Recent Labs Lab 08/16/12 1507 08/17/12 0359  NA 139 140  K 5.0 3.5  CL 103 104  CO2 26 27  GLUCOSE 93 107*  BUN 4* 3*  CREATININE 0.85 0.83  CALCIUM  9.0 8.9   Liver Function Tests: No results found for this basename: AST, ALT, ALKPHOS, BILITOT, PROT, ALBUMIN,  in the last 168 hours No results found for this basename: LIPASE, AMYLASE,  in the last 168 hours No results found for this basename: AMMONIA,  in the last 168 hours CBC:  Recent Labs Lab 08/16/12 1507 08/17/12 0359 08/19/12 0750  WBC 5.4 6.6 17.5*  NEUTROABS  --   --  12.9*  HGB 11.5* 10.4* 8.7*  HCT 33.3* 31.1* 25.7*  MCV 72.7* 72.7* 72.0*  PLT 135* 129* 76*   Cardiac Enzymes:  Recent  Labs Lab 08/17/12 1514 08/17/12 1807 08/18/12 0045 08/18/12 1155  TROPONINI <0.30 <0.30 <0.30 <0.30   BNP: No components found with this basename: POCBNP,  CBG: No results found for this basename: GLUCAP,  in the last 168 hours  Recent Results (from the past 240 hour(s))  MRSA PCR SCREENING     Status: None   Collection Time    08/16/12 11:48 PM      Result Value Range Status   MRSA by PCR NEGATIVE  NEGATIVE Final   Comment:            The GeneXpert MRSA Assay (FDA     approved for NASAL specimens     only), is one component of a     comprehensive MRSA colonization     surveillance program. It is not     intended to diagnose MRSA     infection nor to guide or     monitor treatment for     MRSA infections.     Studies: Dg Chest Port 1 View  08/19/2012   *RADIOLOGY REPORT*  Clinical Data: Body aches.  Fever.  Sickle cell.  PORTABLE CHEST - 1 VIEW  Comparison: 08/16/2012  Findings: Low lung volumes.  Bibasilar atelectasis increased. Sclerotic changes in the bones. Upper normal heart size.  No pneumothorax or pleural effusion.  IMPRESSION: Increased bibasilar atelectasis.   Original Report Authenticated By: Jolaine Click, M.D.    Scheduled Meds: . folic acid  1 mg Oral Daily  . sodium chloride  3 mL Intravenous Q12H   Continuous Infusions:

## 2012-08-21 DIAGNOSIS — E876 Hypokalemia: Secondary | ICD-10-CM

## 2012-08-21 LAB — BASIC METABOLIC PANEL
CO2: 31 mEq/L (ref 19–32)
Calcium: 8.9 mg/dL (ref 8.4–10.5)
GFR calc Af Amer: >90 mL/min (ref >90)
GFR calc non Af Amer: >90 mL/min (ref >90)
Sodium: 134 mEq/L — ABNORMAL LOW (ref 135–145)

## 2012-08-21 LAB — CBC
MCH: 24 pg — ABNORMAL LOW (ref 26.0–34.0)
MCHC: 33.3 g/dL (ref 30.0–36.0)
Platelets: 84 10*3/uL — ABNORMAL LOW (ref 150–400)
RBC: 3.34 MIL/uL — ABNORMAL LOW (ref 4.22–5.81)
RDW: 14.9 % (ref 11.5–15.5)

## 2012-08-21 MED ORDER — MORPHINE SULFATE 4 MG/ML IJ SOLN
4.0000 mg | INTRAMUSCULAR | Status: DC | PRN
  Administered 2012-08-21 – 2012-08-22 (×10): 4 mg via INTRAVENOUS
  Filled 2012-08-21 (×9): qty 1

## 2012-08-21 MED ORDER — POTASSIUM CHLORIDE CRYS ER 20 MEQ PO TBCR
40.0000 meq | EXTENDED_RELEASE_TABLET | Freq: Once | ORAL | Status: AC
  Administered 2012-08-21: 40 meq via ORAL
  Filled 2012-08-21: qty 2

## 2012-08-21 NOTE — Progress Notes (Signed)
[facility] PROGRESS NOTE  Jago Carton ZOX:[id number] DOB: 08-25-1990 DOA: 08/16/2012 PCP: Pcp Not In System  Brief narrative: 22 y.o. male history of sickle cell disease presented to [facility] ED for chest pain for 1 day prior to this admission because of chest pain. Patient reported retrosternal chest pain worse with breathing, sharp, 10 out of 10 in intensity, nonradiating. Patient denied associated fever, chills or shortness of breath. D-dimer was elevated at 1.16. Chest x-ray showed chronic basilar scarring.  Hospital course is complicated due to ongoing fever despite antibiotics. We have stopped Levaquin and started vanco and zosyn for possible pneumonia and sepsis.  Assessment/Plan:   Principal Problem:  Acute sickle cell pain crisis  - Pain regimen is with morphine 4-6 mg every 2 hours IV PRN severe pain but we will decrease this to 2-4 mg every 2 hours IV PRN severe pain and leave  percocet every 4 hours PO as needed for moderate pain - stopped IV fluids, patient tolerates regular diet  Active Problems:  Chest pain  - D-dimer elevated at 1.16. Obtained CT chest angio which ruled out pulmonary embolism - Cardiac enzymes for 3 sets - all negative  - chest pain resolved  Community-acquired pneumonia with sepsis - Right lower lobe pneumonia - CXR 08/19/2012 with increased basilar atelectasis  - Stopped Levaquin and start vanco and zosyn for possible sepsis  - Procalcitonin level - 0.58 - Blood culture results - show no growth to date Leukocytosis  - likely due to pneumonia  - WBC count WNL today - antibiotics changed to vanco and zosyn for possible sepsis  - levaquin discontinued  Thrombocytopenia  - lLkely due to Lovenox  - Lovenox discontinued; use SCD's bilaterally  - platelet count trending up Sickle cell anemia  - hemoglobin 8 today  - check CBC in am and transfuse if less than 8 - no indications for transfusion today Hypokalemia - repleted today - follow up BMP in  am  Code Status: full code  Family Communication: no family at the bedside  Disposition Plan: home when stable   Manson Passey, MD  [facility]  Pager [phone]   Consultants:  None  Procedures:  None  Antibiotics:  Levaquin 08/16/2012 --> 08/20/2012  Vanco 08/20/2012 -->  Zosyn 08/20/2012 -->   If 7PM-7AM, please contact night-coverage [email or web address] Password [email or web address] 08/21/2012, 7:01 AM   LOS: 5 days    HPI/Subjective: No acute overnight events.  Objective: Filed Vitals:   08/20/12 0635 08/20/12 1300 08/20/12 2213 08/21/12 0526  BP: 120/78 118/57 116/63 119/52  Pulse: 111 98 77 61  Temp: 99.9 F (37.7 C) 98.8 F (37.1 C) 99.1 F (37.3 C) 98.5 F (36.9 C)  TempSrc: Oral Oral Oral Oral  Resp: 18 16 20 16   Height:      Weight:      SpO2: 100% 98% 100% 100%    Intake/Output Summary (Last 24 hours) at 08/21/12 0701 Last data filed at 08/21/12 0444  Gross per 24 hour  Intake   1210 ml  Output   2250 ml  Net  -1040 ml    Exam:   General:  Pt is alert, follows commands appropriately, not in acute distress  Cardiovascular: Regular rate and rhythm, S1/S2, no murmurs, no rubs, no gallops  Respiratory: Clear to auscultation bilaterally, no wheezing, no crackles, no rhonchi  Abdomen: Soft, non tender, non distended, bowel sounds present, no guarding  Extremities: No edema, pulses DP and PT palpable bilaterally  Neuro: Grossly nonfocal  Data Reviewed:  Basic Metabolic Panel:  Recent Labs Lab 08/16/12 1507 08/17/12 0359 08/21/12 0516  NA 139 140 134*  K 5.0 3.5 3.3*  CL 103 104 97  CO2 26 27 31   GLUCOSE 93 107* 126*  BUN 4* 3* 6  CREATININE 0.85 0.83 0.83  CALCIUM 9.0 8.9 8.9   Liver Function Tests: No results found for this basename: AST, ALT, ALKPHOS, BILITOT, PROT, ALBUMIN,  in the last 168 hours No results found for this basename: LIPASE, AMYLASE,  in the last 168 hours No results found for this basename: AMMONIA,  in the last 168 hours CBC:  Recent  Labs Lab 08/16/12 1507 08/17/12 0359 08/19/12 0750 08/21/12 0516  WBC 5.4 6.6 17.5* 9.7  NEUTROABS  --   --  12.9*  --   HGB 11.5* 10.4* 8.7* 8.0*  HCT 33.3* 31.1* 25.7* 24.0*  MCV 72.7* 72.7* 72.0* 71.9*  PLT 135* 129* 76* 84*   Cardiac Enzymes:  Recent Labs Lab 08/17/12 1514 08/17/12 1807 08/18/12 0045 08/18/12 1155  TROPONINI <0.30 <0.30 <0.30 <0.30   BNP: No components found with this basename: POCBNP,  CBG: No results found for this basename: GLUCAP,  in the last 168 hours  MRSA PCR SCREENING     Status: None   Collection Time    08/16/12 11:48 PM      Result Value Range Status   MRSA by PCR NEGATIVE  NEGATIVE Final  CULTURE, BLOOD (ROUTINE X 2)     Status: None   Collection Time    08/19/12  7:50 AM      Result Value Range Status   Specimen Description BLOOD RIGHT HAND   Final   Special Requests BOTTLES DRAWN AEROBIC AND ANAEROBIC 5CC   Final   Culture  Setup Time 08/19/2012 16:31   Final   Culture     Final   Value:        BLOOD CULTURE RECEIVED NO GROWTH TO DATE CULTURE WILL BE HELD FOR 5 DAYS BEFORE ISSUING A FINAL NEGATIVE REPORT   Report Status PENDING   Incomplete  CULTURE, BLOOD (ROUTINE X 2)     Status: None   Collection Time    08/19/12  8:01 AM      Result Value Range Status   Specimen Description BLOOD LEFT ARM   Final   Special Requests BOTTLES DRAWN AEROBIC AND ANAEROBIC 3CC   Final   Culture  Setup Time 08/19/2012 16:31   Final   Culture     Final   Value:        BLOOD CULTURE RECEIVED NO GROWTH TO DATE CULTURE WILL BE HELD FOR 5 DAYS BEFORE ISSUING A FINAL NEGATIVE REPORT   Report Status PENDING   Incomplete  URINE CULTURE     Status: None   Collection Time    08/19/12 10:24 AM      Result Value Range Status   Specimen Description URINE, CLEAN CATCH   Final   Special Requests Normal   Final   Culture  Setup Time 08/19/2012 15:01   Final   Colony Count NO GROWTH   Final   Culture NO GROWTH   Final   Report Status 08/20/2012 FINAL    Final     Studies: Dg Chest Port 1 View 08/19/2012   * IMPRESSION: Increased bibasilar atelectasis.   Original Report Authenticated By: Jolaine Click, M.D.    Scheduled Meds: . folic acid  1 mg Oral Daily  . piperacillin-tazobactam (ZOSYN)  IV  3.375  g Intravenous Q8H  . potassium chloride  40 mEq Oral Once  . vancomycin  1,000 mg Intravenous Q8H

## 2012-08-22 LAB — BASIC METABOLIC PANEL
Chloride: 96 mEq/L (ref 96–112)
GFR calc Af Amer: >90 mL/min (ref >90)
GFR calc non Af Amer: >90 mL/min (ref >90)
Potassium: 5.6 mEq/L — ABNORMAL HIGH (ref 3.5–5.1)
Sodium: 133 mEq/L — ABNORMAL LOW (ref 135–145)

## 2012-08-22 LAB — CBC
MCHC: 33.1 g/dL (ref 30.0–36.0)
Platelets: 101 10*3/uL — ABNORMAL LOW (ref 150–400)
RDW: 15.1 % (ref 11.5–15.5)
WBC: 8.6 10*3/uL (ref 4.0–10.5)

## 2012-08-22 LAB — PROCALCITONIN: Procalcitonin: 0.28 ng/mL

## 2012-08-22 MED ORDER — LEVOFLOXACIN 750 MG PO TABS: 750.0000 mg | ORAL_TABLET | Freq: Every day | ORAL | Status: DC

## 2012-08-22 MED ORDER — OXYCODONE-ACETAMINOPHEN 5-325 MG PO TABS: 2.0000 | ORAL_TABLET | ORAL | Status: DC | PRN

## 2012-08-22 MED ORDER — SODIUM POLYSTYRENE SULFONATE 15 GM/60ML PO SUSP
15.0000 g | Freq: Once | ORAL | Status: AC
  Administered 2012-08-22: 15 g via ORAL
  Filled 2012-08-22: qty 60

## 2012-08-22 MED ORDER — FOLIC ACID 1 MG PO TABS: 1.0000 mg | ORAL_TABLET | Freq: Every day | ORAL | Status: DC

## 2012-08-22 NOTE — Discharge Summary (Signed)
Physician Discharge Summary  Paul Campos WGN:[id number] DOB: 09-17-1990 DOA: 08/16/2012  PCP: Pcp Not In System  Admit date: 08/16/2012 Discharge date: 08/22/2012  Recommendations for Outpatient Follow-up:  1. Follow up with PCP in 1 -2 weeks post discharge 2. Recheck potassium level and hemoglobin on your next appointment with PCP  Discharge Diagnoses:  Principal Problem:   Sickle cell pain crisis Active Problems:   Chest pain   CAP (community acquired pneumonia)   Thrombocytopenia, unspecified   Hypokalemia    Discharge Condition: medically stable for discharge home today  Diet recommendation: as tolerated  History of present illness:  22 y.o. 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 Patient reported retrosternal chest pain worse with breathing, sharp, 10 out of 10 in intensity, nonradiating. Patient denied associated fever, chills or shortness of breath. D-dimer was elevated at 1.16. Chest x-ray showed chronic basilar scarring.  Hospital course is complicated due to ongoing fever despite antibiotics. We have stopped Levaquin and started vanco and zosyn for possible pneumonia and sepsis. Now afebrile so we will discharge the patient with Levaquin for next 5 days on discharge.  Assessment/Plan:   Principal Problem:  Acute sickle cell pain crisis  - Pain regimen with percocet every 4 hours PO as needed for moderate pain  - stopped IV fluids, patient tolerates regular diet   Active Problems:  Chest pain  - D-dimer elevated at 1.16. Obtained CT chest angio which ruled out pulmonary embolism  - Cardiac enzymes for 3 sets - all negative  - chest pain resolved  Community-acquired pneumonia with sepsis  - Right lower lobe pneumonia  - CXR 08/19/2012 with increased basilar atelectasis  - Stopped Levaquin and started vanco and zosyn for possible sepsis; patietn remains afebrile and we will switch  to PO Levaquin for 5 days on discharge  - Procalcitonin level - 0.58  - Blood culture results - show no growth to date  Leukocytosis  - likely due to pneumonia  - WBC count WNL today  - antibiotics changed to vanco and zosyn for possible sepsis  - d/c vanco and zosyn today and discharge with Levaquin for next 5 days  Thrombocytopenia  - Lkely due to Lovenox  - Lovenox discontinued; use SCD's bilaterally  - platelet count trending up, 101 today Sickle cell anemia  - hemoglobin 8 today  - no indications for transfusion today  Hypokalemia  - repleted today  - potassium now 5.6 so we will give 1 dose kayexalate prior to discharge   Code Status: full code  Family Communication: no family at the bedside   Manson Passey, MD  [facility]  Pager [phone]   Consultants:  None  Procedures:  None  Antibiotics:  Levaquin 08/16/2012 --> 08/20/2012  Vanco 08/20/2012 -->  08/22/2012 Zosyn 08/20/2012 --> 08/22/2012   Discharge Exam: Filed Vitals:   08/22/12 0556  BP: 118/67  Pulse: 82  Temp: 99.3 F (37.4 C)  Resp: 18   Filed Vitals:   08/21/12 0526 08/21/12 1300 08/21/12 2147 08/22/12 0556  BP: 119/52 124/59 115/64 118/67  Pulse: 61 87 74 82  Temp: 98.5 F (36.9 C) 98.6 F (37 C) 99.6 F (37.6 C) 99.3 F (37.4 C)  TempSrc: Oral Oral Oral Oral  Resp: 16 18 16 18   Height:      Weight:      SpO2: 100% 98% 97% 96%    General: Pt is  alert, follows commands appropriately, not in acute distress Cardiovascular: Regular rate and rhythm, S1/S2 +, no murmurs, no rubs, no gallops Respiratory: Clear to auscultation bilaterally, no wheezing, no crackles, no rhonchi Abdominal: Soft, non tender, non distended, bowel sounds +, no guarding Extremities: no edema, no cyanosis, pulses palpable bilaterally DP and PT Neuro: Grossly nonfocal  Discharge Instructions  Discharge Orders   Future Orders Complete By Expires     Call MD for:  difficulty breathing, headache or visual disturbances  As directed      Call MD for:  persistant dizziness or light-headedness  As directed     Call MD for:  persistant nausea and vomiting  As directed     Call MD for:  severe uncontrolled pain  As directed     Diet - low sodium heart healthy  As directed     Increase activity slowly  As directed         Medication List         folic acid 1 MG tablet  Commonly known as:  FOLVITE  Take 1 tablet (1 mg total) by mouth daily.     ibuprofen 200 MG tablet  Commonly known as:  ADVIL,MOTRIN  Take 200 mg by mouth every 6 (six) hours as needed for pain.     levofloxacin 750 MG tablet  Commonly known as:  LEVAQUIN  Take 1 tablet (750 mg total) by mouth daily.     oxyCODONE-acetaminophen 5-325 MG per tablet  Commonly known as:  PERCOCET/ROXICET  Take 2 tablets by mouth every 4 (four) hours as needed for pain.          The results of significant diagnostics from this hospitalization (including imaging, microbiology, ancillary and laboratory) are listed below for reference.    Significant Diagnostic Studies: Dg Chest 2 View  08/16/2012   *RADIOLOGY REPORT*  Clinical Data: Chest pain, sickle cell pain crisis  CHEST - 2 VIEW  Comparison: 05/11/2012  Findings: Improved basilar aeration but there is residual bilateral basilar scarring.  Normal heart size and vascularity.  No new acute airspace process, collapse, consolidation, edema, effusion or pneumothorax.  Trachea is midline.  IMPRESSION: Chronic basilar scarring.  No superimposed acute process.   Original Report Authenticated By: Judie Petit. Miles Costain, M.D.   Ct Angio Chest Pe W/cm &/or Wo Cm  08/17/2012   *RADIOLOGY REPORT*  Clinical Data: History of sickle cell disease, chest pain and elevated D-dimer  CT ANGIOGRAPHY CHEST  Technique:  Multidetector CT imaging of the chest using the standard protocol during bolus administration of intravenous contrast. Multiplanar reconstructed images including MIPs were obtained and reviewed to evaluate the vascular anatomy.   Contrast: OMNIPAQUE IOHEXOL 350 MG/ML SOLN  Comparison: Chest x-ray yesterday 08/17/2046  Findings:  Mediastinum: Unremarkable visualized thyroid gland.  No significant mediastinal or hilar adenopathy.  Strandy soft tissue in the anterior mediastinum consistent with residual thymus.  The thoracic esophagus is diffusely dilated and fluid-filled suggesting significant reflux.  Heart/Vascular: Modest opacification of the pulmonary arterial tree to the proximal segmental level.  Evaluation of the more distal arterial tree limited by a combination of respiratory motion and poor contrast bolus.  No central filling defect to suggest acute pulmonary embolus.  Mild cardiomegaly.  No pericardial effusion. There is a bovine configuration of the aortic arch (two vessel arch with common origin of the brachiocephalic and left common carotid arteries), a normal anatomic variant.  No significant atherosclerotic disease.  Lungs/Pleura: Bilateral lower lobe volume loss with linear  atelectasis.  There may be a small amount of superimposed infiltrate in the right lower lobe.  No edema or pleural effusion.  Upper Abdomen: Cholelithiasis.  The spleen is incompletely imaged but appears prominent.  Otherwise, unremarkable upper abdomen.  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  Characteristic endplate changes throughout the visualized spine.  Using mottled appearance of the bones.  IMPRESSION:  1.  No evidence of pulmonary embolism to the proximal segmental level. 2.  Bilateral lower lobe linear atelectasis slightly greater on the right than the left. 3.  There may be a small amount of superimposed consolidation in the right lower lobe.  Recommend clinical correlation for signs and symptoms of bronchopneumonia.  4.  Mild cardiomegaly likely secondary to high cardiac output related to chronic anemia 5.  Incompletely imaged but likely enlarged spleen 6.  Cholelithiasis 7.   Diffuse mottled appearance of the bones with a characteristic endplate abnormalities and humeral head sclerosis consistent with the stigmata of sickle cell anemia.   Original Report Authenticated By: Malachy Moan, M.D.   Dg Chest Port 1 View  08/19/2012   *RADIOLOGY REPORT*  Clinical Data: Body aches.  Fever.  Sickle cell.  PORTABLE CHEST - 1 VIEW  Comparison: 08/16/2012  Findings: Low lung volumes.  Bibasilar atelectasis increased. Sclerotic changes in the bones. Upper normal heart size.  No pneumothorax or pleural effusion.  IMPRESSION: Increased bibasilar atelectasis.   Original Report Authenticated By: Jolaine Click, M.D.    Microbiology: Recent Results (from the past 240 hour(s))  MRSA PCR SCREENING     Status: None   Collection Time    08/16/12 11:48 PM      Result Value Range Status   MRSA by PCR NEGATIVE  NEGATIVE Final   Comment:            The GeneXpert MRSA Assay (FDA     approved for NASAL specimens     only), is one component of a     comprehensive MRSA colonization     surveillance program. It is not     intended to diagnose MRSA     infection nor to guide or     monitor treatment for     MRSA infections.  CULTURE, BLOOD (ROUTINE X 2)     Status: None   Collection Time    08/19/12  7:50 AM      Result Value Range Status   Specimen Description BLOOD RIGHT HAND   Final   Special Requests BOTTLES DRAWN AEROBIC AND ANAEROBIC 5CC   Final   Culture  Setup Time 08/19/2012 16:31   Final   Culture     Final   Value:        BLOOD CULTURE RECEIVED NO GROWTH TO DATE CULTURE WILL BE HELD FOR 5 DAYS BEFORE ISSUING A FINAL NEGATIVE REPORT   Report Status PENDING   Incomplete  CULTURE, BLOOD (ROUTINE X 2)     Status: None   Collection Time    08/19/12  8:01 AM      Result Value Range Status   Specimen Description BLOOD LEFT ARM   Final   Special Requests BOTTLES DRAWN AEROBIC AND ANAEROBIC 3CC   Final   Culture  Setup Time 08/19/2012 16:31   Final   Culture     Final   Value:         BLOOD CULTURE RECEIVED NO GROWTH TO DATE CULTURE WILL BE HELD FOR  5 DAYS BEFORE ISSUING A FINAL NEGATIVE REPORT   Report Status PENDING   Incomplete  URINE CULTURE     Status: None   Collection Time    08/19/12 10:24 AM      Result Value Range Status   Specimen Description URINE, CLEAN CATCH   Final   Special Requests Normal   Final   Culture  Setup Time 08/19/2012 15:01   Final   Colony Count NO GROWTH   Final   Culture NO GROWTH   Final   Report Status 08/20/2012 FINAL   Final     Labs: Basic Metabolic Panel:  Recent Labs Lab 08/16/12 1507 08/17/12 0359 08/21/12 0516 08/22/12 0435  NA 139 140 134* 133*  K 5.0 3.5 3.3* 5.6*  CL 103 104 97 96  CO2 26 27 31 28   GLUCOSE 93 107* 126* 99  BUN 4* 3* 6 6  CREATININE 0.85 0.83 0.83 0.74  CALCIUM 9.0 8.9 8.9 8.9   Liver Function Tests: No results found for this basename: AST, ALT, ALKPHOS, BILITOT, PROT, ALBUMIN,  in the last 168 hours No results found for this basename: LIPASE, AMYLASE,  in the last 168 hours No results found for this basename: AMMONIA,  in the last 168 hours CBC:  Recent Labs Lab 08/16/12 1507 08/17/12 0359 08/19/12 0750 08/21/12 0516 08/22/12 0435  WBC 5.4 6.6 17.5* 9.7 8.6  NEUTROABS  --   --  12.9*  --   --   HGB 11.5* 10.4* 8.7* 8.0* 8.0*  HCT 33.3* 31.1* 25.7* 24.0* 24.2*  MCV 72.7* 72.7* 72.0* 71.9* 73.3*  PLT 135* 129* 76* 84* 101*   Cardiac Enzymes:  Recent Labs Lab 08/17/12 1514 08/17/12 1807 08/18/12 0045 08/18/12 1155  TROPONINI <0.30 <0.30 <0.30 <0.30   BNP: BNP (last 3 results) No results found for this basename: PROBNP,  in the last 8760 hours CBG: No results found for this basename: GLUCAP,  in the last 168 hours  Time coordinating discharge: Over 30 minutes  Signed:  Manson Passey, MD  [facility]  08/22/2012, 9:24 AM  Pager #: [phone]

## 2012-08-22 NOTE — Progress Notes (Signed)
08/22/2012 Colleen Can BSN RN CCM [phone] Pt discharging to home today. NO HH or DME needs.

## 2012-08-25 LAB — CULTURE, BLOOD (ROUTINE X 2): Culture: NO GROWTH

## 2012-09-30 DIAGNOSIS — D649 Anemia, unspecified: Secondary | ICD-10-CM | POA: Insufficient documentation

## 2012-10-01 DIAGNOSIS — R509 Fever, unspecified: Secondary | ICD-10-CM | POA: Insufficient documentation

## 2012-10-01 DIAGNOSIS — A419 Sepsis, unspecified organism: Secondary | ICD-10-CM | POA: Insufficient documentation

## 2013-09-08 ENCOUNTER — Emergency Department (HOSPITAL_BASED_OUTPATIENT_CLINIC_OR_DEPARTMENT_OTHER)
Admission: EM | Admit: 2013-09-08 | Discharge: 2013-09-08 | Disposition: A | Discharge status: Home / Self Care | Payer: Medicaid Other | Attending: Emergency Medicine | Admitting: Emergency Medicine

## 2013-09-08 ENCOUNTER — Encounter (HOSPITAL_BASED_OUTPATIENT_CLINIC_OR_DEPARTMENT_OTHER): Payer: Self-pay | Admitting: Emergency Medicine

## 2013-09-08 DIAGNOSIS — M25519 Pain in unspecified shoulder: Secondary | ICD-10-CM | POA: Diagnosis present

## 2013-09-08 DIAGNOSIS — D5700 Hb-SS disease with crisis, unspecified: Secondary | ICD-10-CM

## 2013-09-08 DIAGNOSIS — Z79899 Other long term (current) drug therapy: Secondary | ICD-10-CM | POA: Insufficient documentation

## 2013-09-08 DIAGNOSIS — Z792 Long term (current) use of antibiotics: Secondary | ICD-10-CM | POA: Insufficient documentation

## 2013-09-08 LAB — BASIC METABOLIC PANEL
ANION GAP: 13 (ref 5–15)
BUN: 4 mg/dL — ABNORMAL LOW (ref 6–23)
CALCIUM: 9.3 mg/dL (ref 8.4–10.5)
CO2: 26 mEq/L (ref 19–32)
CREATININE: 0.9 mg/dL (ref 0.50–1.35)
Chloride: 106 mEq/L (ref 96–112)
GFR calc non Af Amer: >90 mL/min (ref >90)
Glucose, Bld: 118 mg/dL — ABNORMAL HIGH (ref 70–99)
POTASSIUM: 3.7 meq/L (ref 3.7–5.3)
Sodium: 145 mEq/L (ref 137–147)

## 2013-09-08 LAB — CBC WITH DIFFERENTIAL/PLATELET
Basophils Absolute: 0 10*3/uL (ref 0.0–0.1)
Basophils Relative: 1 % (ref 0–1)
EOS ABS: 0.3 10*3/uL (ref 0.0–0.7)
EOS PCT: 5 % (ref 0–5)
HCT: 33 % — ABNORMAL LOW (ref 39.0–52.0)
Hemoglobin: 11.6 g/dL — ABNORMAL LOW (ref 13.0–17.0)
LYMPHS ABS: 1.4 10*3/uL (ref 0.7–4.0)
Lymphocytes Relative: 24 % (ref 12–46)
MCH: 25.6 pg — AB (ref 26.0–34.0)
MCHC: 35.2 g/dL (ref 30.0–36.0)
MCV: 72.8 fL — AB (ref 78.0–100.0)
Monocytes Absolute: 0.5 10*3/uL (ref 0.1–1.0)
Monocytes Relative: 9 % (ref 3–12)
Neutro Abs: 3.5 10*3/uL (ref 1.7–7.7)
Neutrophils Relative %: 61 % (ref 43–77)
PLATELETS: 145 10*3/uL — AB (ref 150–400)
RBC: 4.53 MIL/uL (ref 4.22–5.81)
RDW: 14.3 % (ref 11.5–15.5)
WBC: 5.7 10*3/uL (ref 4.0–10.5)

## 2013-09-08 LAB — RETICULOCYTES
RBC.: 4.56 MIL/uL (ref 4.22–5.81)
RETIC CT PCT: 5.1 % — AB (ref 0.4–3.1)
Retic Count, Absolute: 232.6 10*3/uL — ABNORMAL HIGH (ref 19.0–186.0)

## 2013-09-08 MED ORDER — DIPHENHYDRAMINE HCL 50 MG/ML IJ SOLN
25.0000 mg | Freq: Once | INTRAMUSCULAR | Status: AC
  Administered 2013-09-08: 25 mg via INTRAVENOUS
  Filled 2013-09-08: qty 1

## 2013-09-08 MED ORDER — KETOROLAC TROMETHAMINE 30 MG/ML IJ SOLN
30.0000 mg | Freq: Once | INTRAMUSCULAR | Status: AC
  Administered 2013-09-08: 30 mg via INTRAVENOUS
  Filled 2013-09-08: qty 1

## 2013-09-08 MED ORDER — SODIUM CHLORIDE 0.9 % IV BOLUS (SEPSIS)
500.0000 mL | Freq: Once | INTRAVENOUS | Status: AC
  Administered 2013-09-08: 500 mL via INTRAVENOUS

## 2013-09-08 MED ORDER — MORPHINE SULFATE 2 MG/ML IJ SOLN
2.0000 mg | Freq: Once | INTRAMUSCULAR | Status: AC
  Administered 2013-09-08: 2 mg via INTRAVENOUS
  Filled 2013-09-08: qty 1

## 2013-09-08 MED ORDER — OXYCODONE-ACETAMINOPHEN 5-325 MG PO TABS: 1.0000 | ORAL_TABLET | Freq: Four times a day (QID) | ORAL | Status: DC | PRN

## 2013-09-08 MED ORDER — MORPHINE SULFATE 4 MG/ML IJ SOLN
6.0000 mg | Freq: Once | INTRAMUSCULAR | Status: AC
  Administered 2013-09-08: 6 mg via INTRAVENOUS
  Filled 2013-09-08: qty 2

## 2013-09-08 NOTE — ED Notes (Signed)
Pt w/ c/o itching s/p pain meds and requesting benadryl - Dr. Nicanor AlconPalumbo made aware.

## 2013-09-08 NOTE — Discharge Instructions (Signed)
Sickle Cell Anemia, Adult °Sickle cell anemia is a condition in which red blood cells have an abnormal "sickle" shape. This abnormal shape shortens the cells' life span, which results in a lower than normal concentration of red blood cells in the blood. The sickle shape also causes the cells to clump together and block free blood flow through the blood vessels. As a result, the tissues and organs of the body do not receive enough oxygen. Sickle cell anemia causes organ damage and pain and increases the risk of infection. °CAUSES  °Sickle cell anemia is a genetic disorder. Those who receive two copies of the gene have the condition, and those who receive one copy have the trait. °RISK FACTORS °The sickle cell gene is most common in people whose families originated in [place]. Other areas of the globe where sickle cell trait occurs include the [place], [place] and [place], the [place], and the [place].  °SIGNS AND SYMPTOMS °· Pain, especially in the extremities, back, chest, or abdomen (common). The pain may start suddenly or may develop following an illness, especially if there is dehydration. Pain can also occur due to overexertion or exposure to extreme temperature changes. °· Frequent severe bacterial infections, especially certain types of pneumonia and meningitis. °· Pain and swelling in the hands and feet. °· Decreased activity.   °· Loss of appetite.   °· Change in behavior. °· Headaches. °· Seizures. °· Shortness of breath or difficulty breathing. °· Vision changes. °· Skin ulcers. °Those with the trait may not have symptoms or they may have mild symptoms.  °DIAGNOSIS  °Sickle cell anemia is diagnosed with blood tests that demonstrate the genetic trait. It is often diagnosed during the newborn period, due to mandatory testing nationwide. A variety of blood tests, X-rays, CT scans, MRI scans, ultrasounds, and lung function tests may also be done to monitor the condition. °TREATMENT  °Sickle  cell anemia may be treated with: °· Medicines. You may be given pain medicines, antibiotic medicines (to treat and prevent infections) or medicines to increase the production of certain types of hemoglobin. °· Fluids. °· Oxygen. °· Blood transfusions. °HOME CARE INSTRUCTIONS  °· Drink enough fluid to keep your urine clear or pale yellow. Increase your fluid intake in hot weather and during exercise. °· Do not smoke. Smoking lowers oxygen levels in the blood.   °· Only take over-the-counter or prescription medicines for pain, fever, or discomfort as directed by your health care provider. °· Take antibiotics as directed by your health care provider. Make sure you finish them it even if you start to feel better.   °· Take supplements as directed by your health care provider.   °· Consider wearing a medical alert bracelet. This tells anyone caring for you in an emergency of your condition.   °· When traveling, keep your medical information, health care provider's names, and the medicines you take with you at all times.   °· If you develop a fever, do not take medicines to reduce the fever right away. This could cover up a problem that is developing. Notify your health care provider. °· Keep all follow-up appointments with your health care provider. Sickle cell anemia requires regular medical care. °SEEK MEDICAL CARE IF: ° You have a fever. °SEEK IMMEDIATE MEDICAL CARE IF:  °· You feel dizzy or faint.   °· You have new abdominal pain, especially on the left side near the stomach area.   °· You develop a persistent, often uncomfortable and painful penile erection (priapism). If this is not treated immediately it   will lead to impotence.   °· You have numbness your arms or legs or you have a hard time moving them.   °· You have a hard time with speech.   °· You have a fever or persistent symptoms for more than 2-3 days.   °· You have a fever and your symptoms suddenly get worse.   °· You have signs or symptoms of infection.  These include:   °¨ Chills.   °¨ Abnormal tiredness (lethargy).   °¨ Irritability.   °¨ Poor eating.   °¨ Vomiting.   °· You develop pain that is not helped with medicine.   °· You develop shortness of breath. °· You have pain in your chest.   °· You are coughing up pus-like or bloody sputum.   °· You develop a stiff neck. °· Your feet or hands swell or have pain. °· Your abdomen appears bloated. °· You develop joint pain. °MAKE SURE YOU: °· Understand these instructions. °· Will watch your child's condition. °· Will get help right away if your child is not doing well or gets worse. °Document Released: 05/19/2005 Document Revised: 11/29/2012 Document Reviewed: 09/20/2012 °Exit[employer]® Patient Information ©2015 [employer]. This information is not intended to replace advice given to you by your health care provider. Make sure you discuss any questions you have with your health care provider. ° °

## 2013-09-08 NOTE — ED Notes (Signed)
Pt w/ c/o rt shoulder pain that began yesterday evening approx 2100 - pt took his rx oxycodone w/o relief. Pt admits to hx of sickle cell crisis and states this pain feels similar to a sickle cell crisis - pt denies any recent hx known mechanism of injury. CMS intact - no gross deformity noted on exam.

## 2013-09-08 NOTE — ED Notes (Signed)
Right shoulder pain since 8pm, pt took last 15mg  Roxi at that time. Pt states "I'm having a sickle cell pain crisis"

## 2013-09-08 NOTE — ED Provider Notes (Signed)
CSN: [id number]     Arrival date & time 09/08/13  0025 History   First MD Initiated Contact with Patient 09/08/13 [phone]     Chief Complaint  Patient presents with  . Shoulder Pain     (Consider location/radiation/quality/duration/timing/severity/associated sxs/prior Treatment) Patient is a 23 y.o. male presenting with sickle cell pain. The history is provided by the patient.  Sickle Cell Pain Crisis Pain location: right shoulder. Severity:  Severe Onset quality:  Gradual Similar to previous crisis episodes: yes   Timing:  Constant Progression:  Unchanged Chronicity:  Recurrent History of pulmonary emboli: no   Context: not alcohol consumption, not cold exposure and not infection   Relieved by:  Nothing Worsened by:  Nothing tried Ineffective treatments:  None tried Associated symptoms: no chest pain, no cough, no fever, no nausea, no priapism, no shortness of breath, no sore throat, no swelling of legs and no wheezing   Risk factors: no cholecystectomy     Past Medical History  Diagnosis Date  . Sickle cell anemia   . Sickle cell anemia    Past Surgical History  Procedure Laterality Date  . Appendectomy     Family History  Problem Relation Age of Onset  . Lung cancer Mother    History  Substance Use Topics  . Smoking status: Never Smoker   . Smokeless tobacco: Not on file  . Alcohol Use: No    Review of Systems  Constitutional: Negative for fever.  HENT: Negative for sore throat.   Respiratory: Negative for cough, shortness of breath and wheezing.   Cardiovascular: Negative for chest pain.  Gastrointestinal: Negative for nausea.  All other systems reviewed and are negative.     Allergies  Dilaudid; Hydromorphone; and Cefotaxime  Home Medications   Prior to Admission medications   Medication Sig Start Date End Date Taking? Authorizing Provider  oxyCODONE-acetaminophen (PERCOCET/ROXICET) 5-325 MG per tablet Take 2 tablets by mouth every 4 (four) hours as  needed for pain. 08/22/12  Yes Alison MurrayAlma M Devine, MD  folic acid (FOLVITE) 1 MG tablet Take 1 tablet (1 mg total) by mouth daily. 08/22/12   Alison MurrayAlma M Devine, MD  ibuprofen (ADVIL,MOTRIN) 200 MG tablet Take 200 mg by mouth every 6 (six) hours as needed for pain.    Historical Provider, MD  levofloxacin (LEVAQUIN) 750 MG tablet Take 1 tablet (750 mg total) by mouth daily. 08/22/12   Alison MurrayAlma M Devine, MD   BP 131/80  Pulse 52  Temp(Src) 99.1 F (37.3 C) (Oral)  Resp 18  Ht 6\' 1"  (1.854 m)  Wt 175 lb (79.379 kg)  BMI 23.09 kg/m2  SpO2 100% Physical Exam  Constitutional: He is oriented to person, place, and time. He appears well-developed and well-nourished. No distress.  HENT:  Head: Normocephalic and atraumatic.  Mouth/Throat: Oropharynx is clear and moist. No oropharyngeal exudate.  Eyes: Conjunctivae are normal. Pupils are equal, round, and reactive to light.  Neck: Normal range of motion. Neck supple.  Cardiovascular: Normal rate, regular rhythm and intact distal pulses.   Pulmonary/Chest: Effort normal and breath sounds normal. No respiratory distress. He has no wheezes. He has no rales. He exhibits no tenderness.  Abdominal: Soft. Bowel sounds are normal. There is no tenderness. There is no rebound and no guarding.  Musculoskeletal: Normal range of motion. He exhibits no edema and no tenderness.  Neurological: He is alert and oriented to person, place, and time.  Skin: Skin is warm and dry.  Psychiatric: He has a normal  mood and affect.    ED Course  Procedures (including critical care time) Labs Review Labs Reviewed  CBC WITH DIFFERENTIAL - Abnormal; Notable for the following:    Hemoglobin 11.6 (*)    HCT 33.0 (*)    MCV 72.8 (*)    MCH 25.6 (*)    Platelets 145 (*)    All other components within normal limits  BASIC METABOLIC PANEL - Abnormal; Notable for the following:    Glucose, Bld 118 (*)    BUN 4 (*)    All other components within normal limits  RETICULOCYTES    Imaging  Review No results found.   EKG Interpretation None      MDM   Final diagnoses:  None    Medications  sodium chloride 0.9 % bolus 500 mL (0 mLs Intravenous Stopped 09/08/13 0201)  morphine 4 MG/ML injection 6 mg (6 mg Intravenous Given 09/08/13 0059)  ketorolac (TORADOL) 30 MG/ML injection 30 mg (30 mg Intravenous Given 09/08/13 0059)  diphenhydrAMINE (BENADRYL) injection 25 mg (25 mg Intravenous Given 09/08/13 0200)  morphine 2 MG/ML injection 2 mg (2 mg Intravenous Given 09/08/13 0230)    Follow up at [facility] for ongoing care    Niah Heinle K Tyland Klemens-Rasch, MD 09/08/13 [phone]

## 2013-09-08 NOTE — ED Notes (Signed)
Pt ambulating independently w/ steady gait on d/c in no acute distress, A&Ox4. D/c instructions reviewed w/ pt and family - pt and family deny any further questions or concerns at present. Rx given x1  

## 2014-01-27 IMAGING — CR DG CHEST 2V
2 series · 2 of 2 positions shown · non-contrast
Comparison: 05/07/2012.

CLINICAL DATA: a fever and back pain.

CHEST - 2 VIEW

[w chest pa]
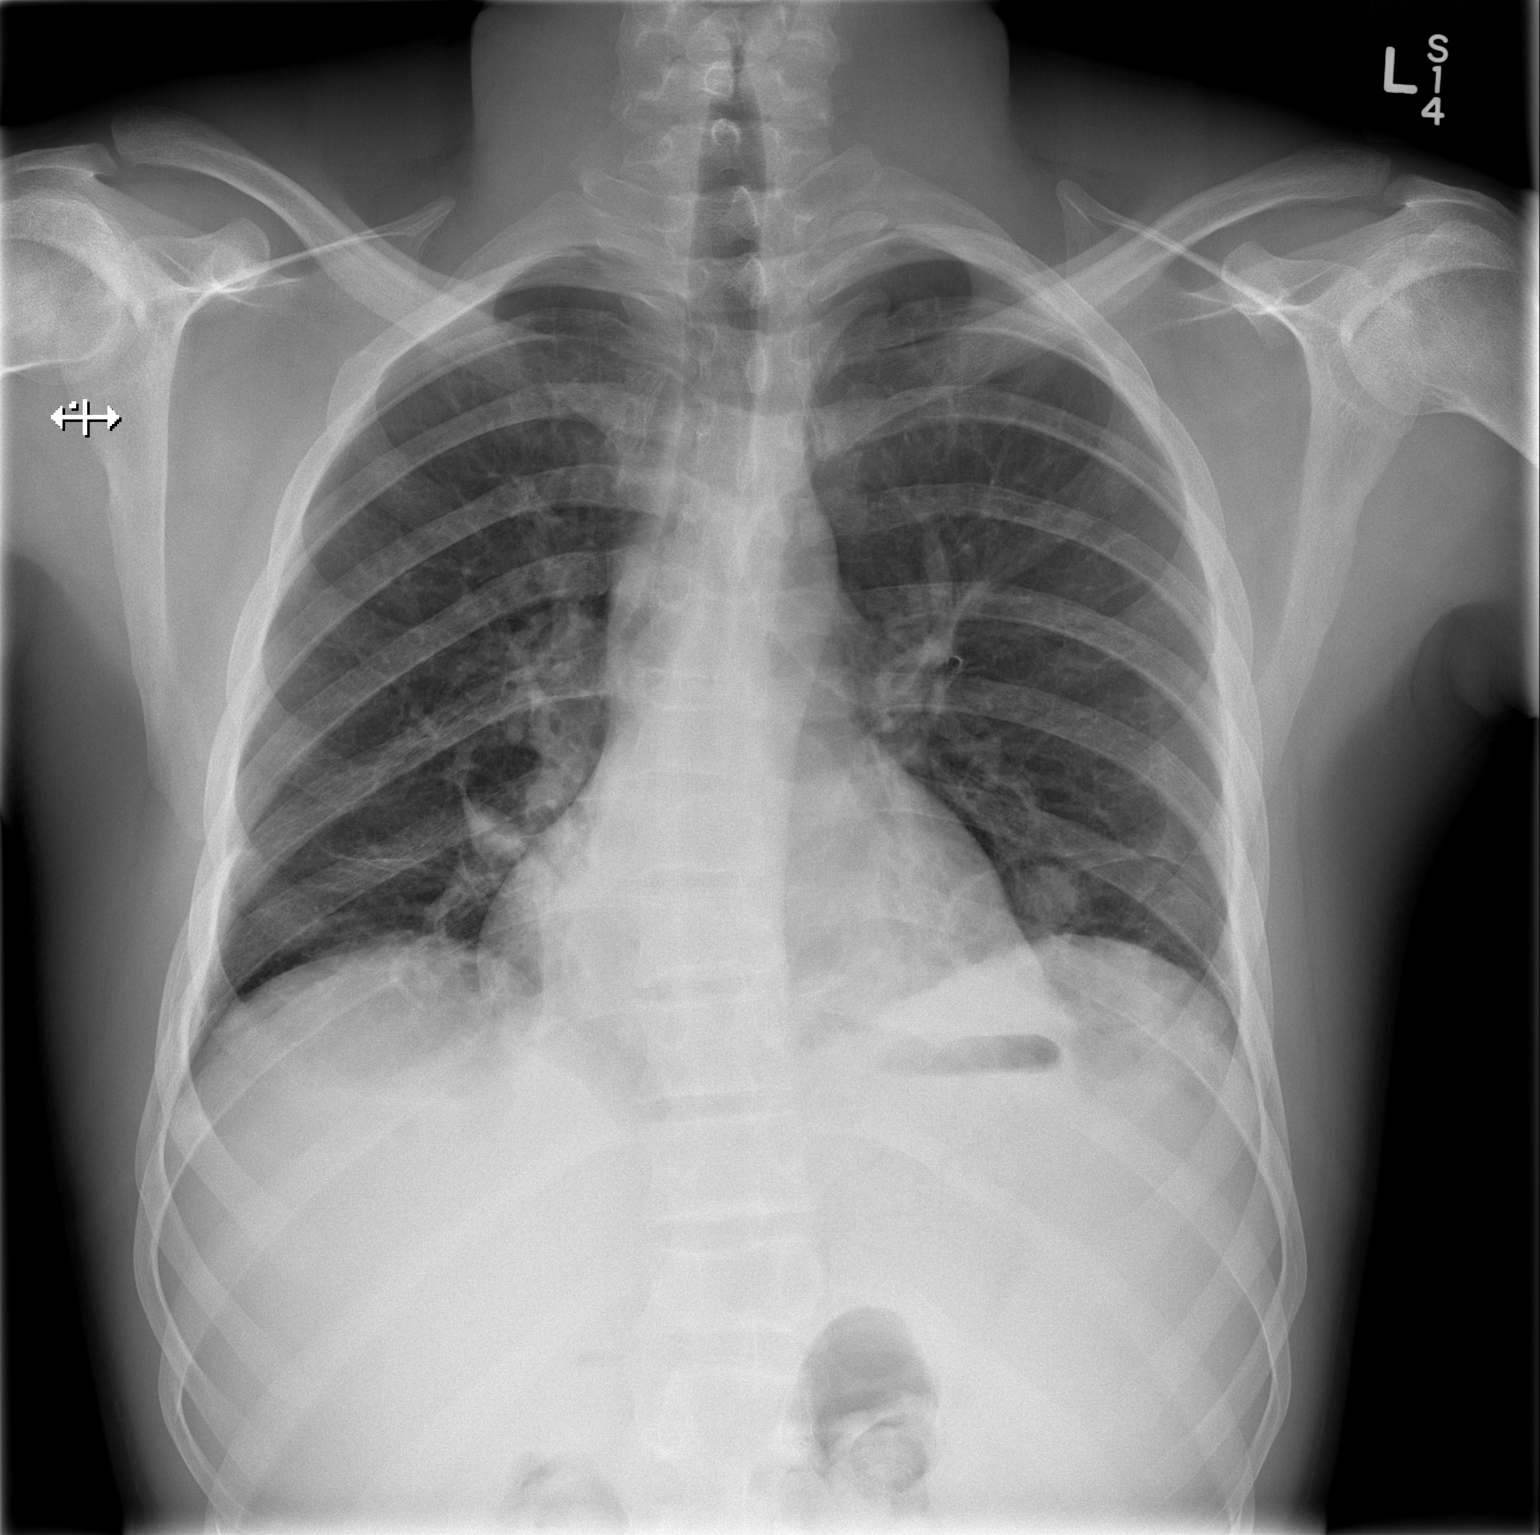

[w chest lat]
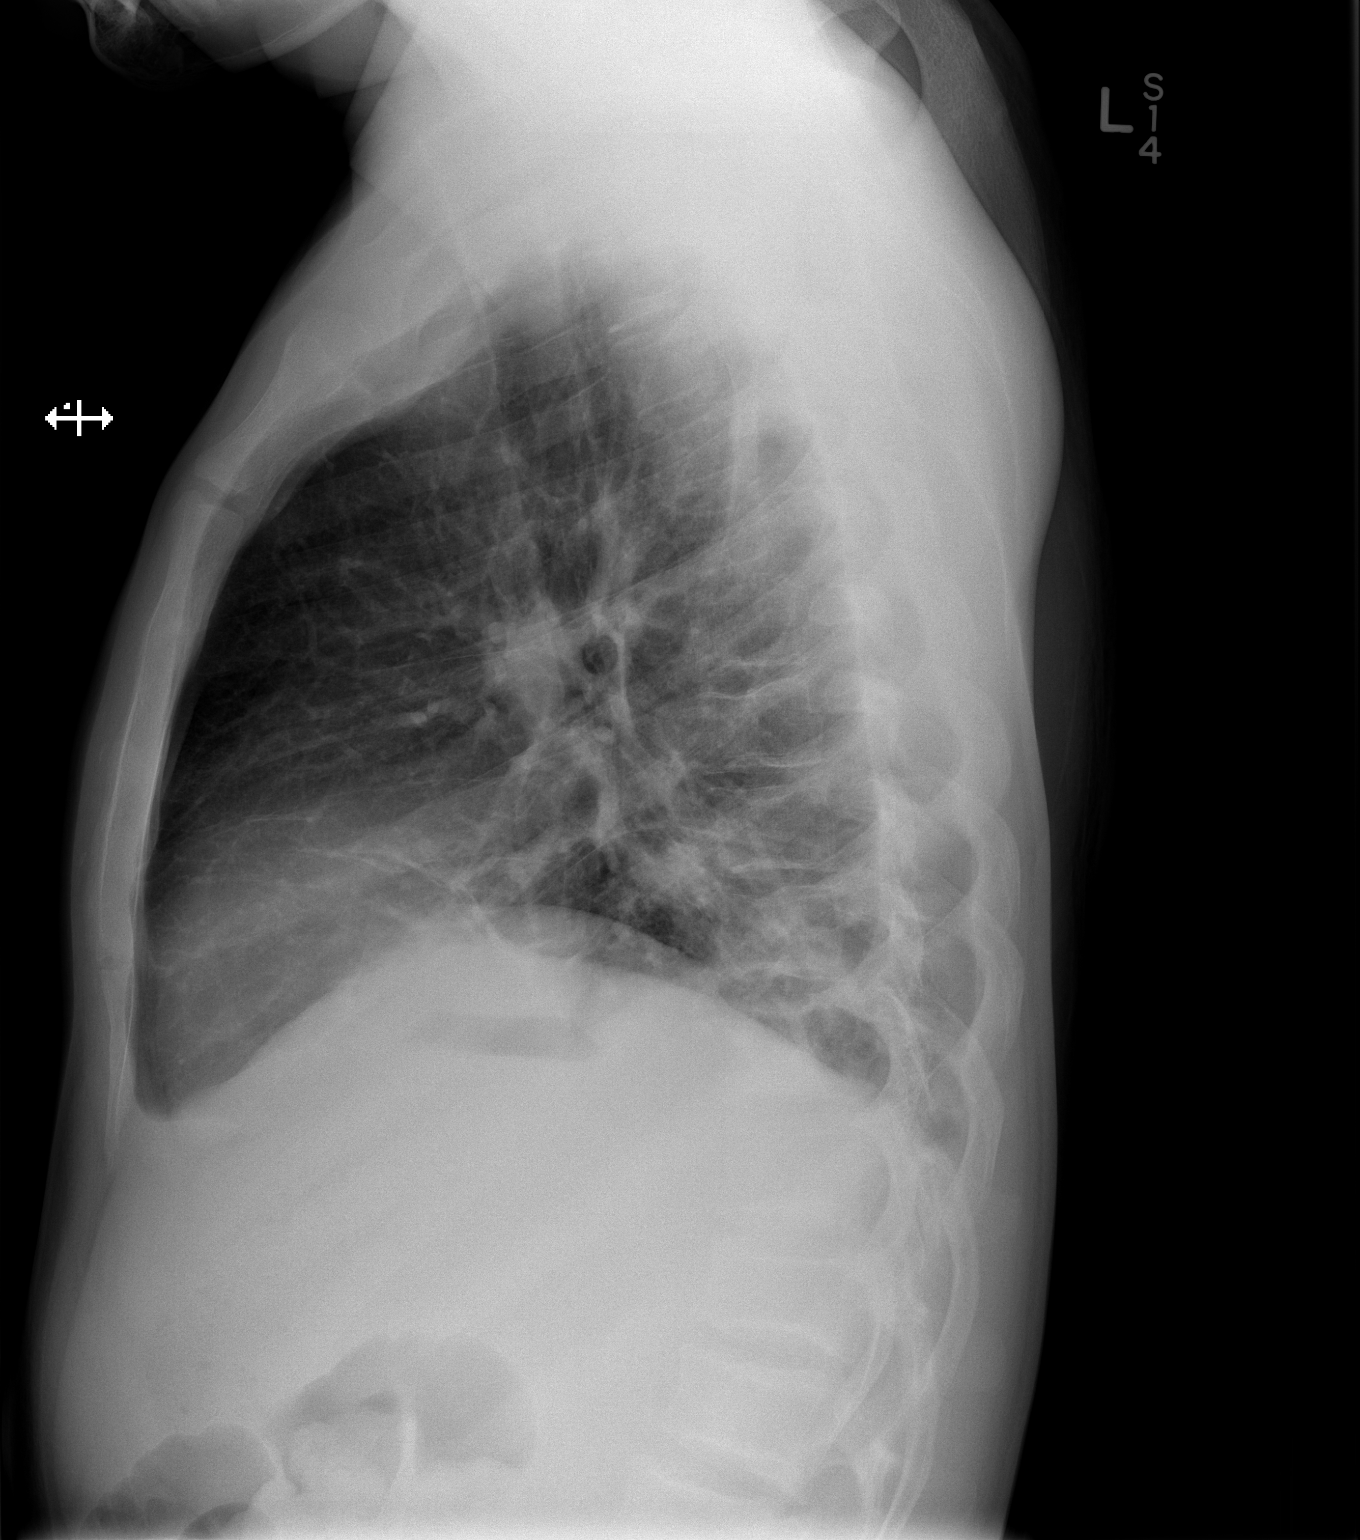

[2 of 2 positions shown; findings below may reference images not displayed]

FINDINGS: The cardiac silhouette, mediastinal and hilar contours
are within normal limits and stable.  There are bibasilar
infiltrates and atelectasis.  No definite pleural effusions.  The
bony thorax is intact.
IMPRESSION: Bibasilar infiltrates and atelectasis.

## 2014-05-07 IMAGING — CR DG CHEST 1V PORT
1 series · 1 of 1 positions shown · non-contrast
Comparison: 08/16/2012

CLINICAL DATA: Body aches.  Fever.  Sickle cell.

PORTABLE CHEST - 1 VIEW

[AP]
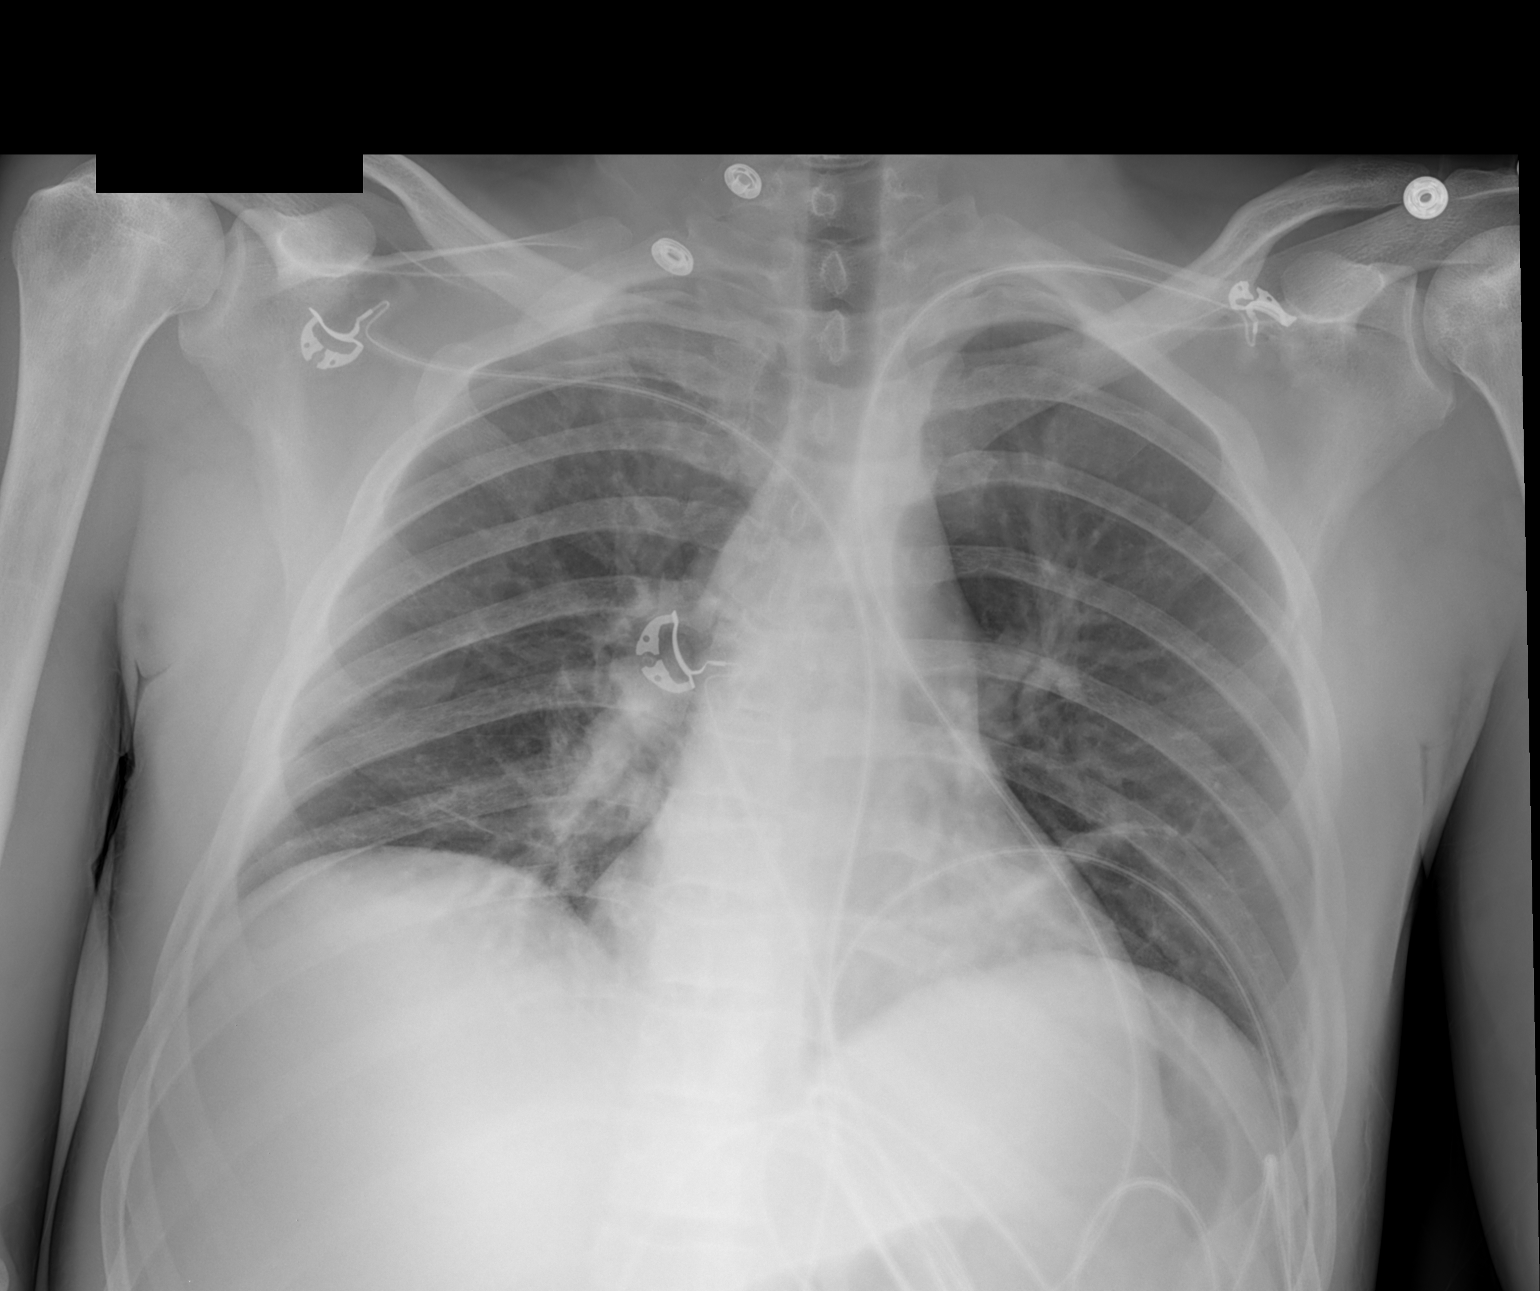

[1 of 1 positions shown; findings below may reference images not displayed]

FINDINGS: Low lung volumes.  Bibasilar atelectasis increased.
Sclerotic changes in the bones. Upper normal heart size.  No
pneumothorax or pleural effusion.
IMPRESSION: Increased bibasilar atelectasis.

## 2015-07-17 DIAGNOSIS — R519 Headache, unspecified: Secondary | ICD-10-CM

## 2015-07-17 HISTORY — DX: Headache, unspecified: R51.9

## 2015-11-04 DIAGNOSIS — G43009 Migraine without aura, not intractable, without status migrainosus: Secondary | ICD-10-CM | POA: Insufficient documentation

## 2016-05-27 ENCOUNTER — Emergency Department (HOSPITAL_BASED_OUTPATIENT_CLINIC_OR_DEPARTMENT_OTHER): Payer: Medicaid Other

## 2016-05-27 ENCOUNTER — Emergency Department (HOSPITAL_BASED_OUTPATIENT_CLINIC_OR_DEPARTMENT_OTHER)
Admission: EM | Admit: 2016-05-27 | Discharge: 2016-05-27 | Disposition: A | Discharge status: Home / Self Care | Payer: Medicaid Other | Attending: Emergency Medicine | Admitting: Emergency Medicine

## 2016-05-27 ENCOUNTER — Encounter (HOSPITAL_BASED_OUTPATIENT_CLINIC_OR_DEPARTMENT_OTHER): Payer: Self-pay | Admitting: *Deleted

## 2016-05-27 DIAGNOSIS — J189 Pneumonia, unspecified organism: Secondary | ICD-10-CM | POA: Diagnosis not present

## 2016-05-27 DIAGNOSIS — D5700 Hb-SS disease with crisis, unspecified: Secondary | ICD-10-CM | POA: Diagnosis not present

## 2016-05-27 DIAGNOSIS — R0789 Other chest pain: Secondary | ICD-10-CM | POA: Diagnosis present

## 2016-05-27 DIAGNOSIS — Z79899 Other long term (current) drug therapy: Secondary | ICD-10-CM | POA: Insufficient documentation

## 2016-05-27 LAB — BASIC METABOLIC PANEL
Anion gap: 8 (ref 5–15)
BUN: <5 mg/dL — ABNORMAL LOW (ref 6–20)
CALCIUM: 8.7 mg/dL — AB (ref 8.9–10.3)
CO2: 26 mmol/L (ref 22–32)
CREATININE: 0.79 mg/dL (ref 0.61–1.24)
Chloride: 104 mmol/L (ref 101–111)
GFR calc Af Amer: >60 mL/min (ref >60)
GFR calc non Af Amer: >60 mL/min (ref >60)
GLUCOSE: 106 mg/dL — AB (ref 65–99)
Potassium: 3.6 mmol/L (ref 3.5–5.1)
Sodium: 138 mmol/L (ref 135–145)

## 2016-05-27 LAB — CBC WITH DIFFERENTIAL/PLATELET
BASOS ABS: 0 10*3/uL (ref 0.0–0.1)
BASOS PCT: 0 %
EOS ABS: 0.2 10*3/uL (ref 0.0–0.7)
Eosinophils Relative: 2 %
HEMATOCRIT: 32.9 % — AB (ref 39.0–52.0)
Hemoglobin: 11.4 g/dL — ABNORMAL LOW (ref 13.0–17.0)
LYMPHS ABS: 3.5 10*3/uL (ref 0.7–4.0)
Lymphocytes Relative: 32 %
MCH: 25.4 pg — AB (ref 26.0–34.0)
MCHC: 34.7 g/dL (ref 30.0–36.0)
MCV: 73.4 fL — ABNORMAL LOW (ref 78.0–100.0)
Monocytes Absolute: 1.5 10*3/uL — ABNORMAL HIGH (ref 0.1–1.0)
Monocytes Relative: 14 %
Neutro Abs: 5.7 10*3/uL (ref 1.7–7.7)
Neutrophils Relative %: 52 %
Platelets: 145 10*3/uL — ABNORMAL LOW (ref 150–400)
RBC: 4.48 MIL/uL (ref 4.22–5.81)
RDW: 18.7 % — AB (ref 11.5–15.5)
WBC: 10.9 10*3/uL — ABNORMAL HIGH (ref 4.0–10.5)

## 2016-05-27 LAB — RETICULOCYTES
RBC.: 4.52 MIL/uL (ref 4.22–5.81)
RETIC COUNT ABSOLUTE: 370.6 10*3/uL — AB (ref 19.0–186.0)
Retic Ct Pct: 8.2 % — ABNORMAL HIGH (ref 0.4–3.1)

## 2016-05-27 MED ORDER — MORPHINE SULFATE (PF) 10 MG/ML IV SOLN
10.0000 mg | INTRAVENOUS | Status: DC | PRN
  Filled 2016-05-27: qty 1

## 2016-05-27 MED ORDER — MORPHINE SULFATE (PF) 4 MG/ML IV SOLN
8.0000 mg | INTRAVENOUS | Status: DC | PRN
  Administered 2016-05-27 (×2): 8 mg via INTRAVENOUS
  Filled 2016-05-27: qty 2

## 2016-05-27 MED ORDER — MORPHINE SULFATE (PF) 4 MG/ML IV SOLN
8.0000 mg | INTRAVENOUS | Status: DC | PRN
  Filled 2016-05-27: qty 2

## 2016-05-27 MED ORDER — KETOROLAC TROMETHAMINE 30 MG/ML IJ SOLN
30.0000 mg | Freq: Once | INTRAMUSCULAR | Status: AC
  Administered 2016-05-27: 30 mg via INTRAVENOUS
  Filled 2016-05-27: qty 1

## 2016-05-27 MED ORDER — SODIUM CHLORIDE 0.45 % IV SOLN
INTRAVENOUS | Status: DC
  Administered 2016-05-27: 09:00:00 via INTRAVENOUS

## 2016-05-27 MED ORDER — LEVOFLOXACIN IN D5W 750 MG/150ML IV SOLN
750.0000 mg | INTRAVENOUS | Status: DC
  Administered 2016-05-27: 750 mg via INTRAVENOUS
  Filled 2016-05-27: qty 150

## 2016-05-27 MED ORDER — LEVOFLOXACIN 500 MG PO TABS: 500.0000 mg | ORAL_TABLET | Freq: Every day | ORAL | 0 refills | Status: DC

## 2016-05-27 MED ORDER — SODIUM CHLORIDE 0.9 % IV BOLUS (SEPSIS)
1000.0000 mL | Freq: Once | INTRAVENOUS | Status: AC
  Administered 2016-05-27: 1000 mL via INTRAVENOUS

## 2016-05-27 MED ORDER — OXYCODONE HCL 5 MG PO TABS
20.0000 mg | ORAL_TABLET | Freq: Once | ORAL | Status: AC
  Administered 2016-05-27: 20 mg via ORAL
  Filled 2016-05-27: qty 4

## 2016-05-27 MED FILL — levoFLOXacin 500 MG TABS: 500 | 7 days supply | Qty: 7 | Fill #0

## 2016-05-27 NOTE — ED Notes (Signed)
Attempted IV x 2 with no success. 

## 2016-05-27 NOTE — ED Notes (Signed)
Called [facility] 

## 2016-05-27 NOTE — ED Triage Notes (Signed)
Pt with left rib pain x 2 days reports intermittent [facility] denies N/V diaphoresis or radiation.

## 2016-05-27 NOTE — Discharge Instructions (Signed)
Please return with any shortness of breath, if your breathing quickly, or having trouble catching your breath. Or any increasing chest pain.

## 2016-05-27 NOTE — ED Notes (Addendum)
ED Provider at bedside. Pt cont rate pain at 5/10.

## 2016-05-27 NOTE — ED Provider Notes (Signed)
[facility] Provider Note   CSN: [id number] Arrival date & time: 05/27/16  0554     History   Chief Complaint Chief Complaint  Patient presents with  . Chest Pain    HPI Paul Campos is a 26 y.o. male.  HPI  This is a 26 year old male with history of sickle cell anemia who presents with chest pain. Patient reports left-sided chest pain worse over the last 2 days. He states classically he has pain in his back, legs. This pain is atypical for him. He denies shortness of breath or fevers. He normally takes oxycodone for pain. He does not take it daily.  He took oxycodone without relief today. Rates his pain at 10 out of 10. He waited at [facility] for 3 hours prior to leaving without being seen. At that time he had a chest x-ray that was reassuring and a CBC.  Past Medical History:  Diagnosis Date  . Sickle cell anemia (HCC)   . Sickle cell anemia [facility])     Patient Active Problem List   Diagnosis Date Noted  . Hypokalemia 08/21/2012  . Thrombocytopenia, unspecified 08/19/2012  . CAP (community acquired pneumonia) 08/18/2012  . Sickle cell pain crisis (HCC) 05/06/2012  . Chest pain 05/06/2012    Past Surgical History:  Procedure Laterality Date  . APPENDECTOMY         Home Medications    Prior to Admission medications   Medication Sig Start Date End Date Taking? Authorizing Provider  hydroxyurea (HYDREA) 500 MG capsule Take 500 mg by mouth daily. May take with food to minimize GI side effects.   Yes Historical Provider, MD  folic acid (FOLVITE) 1 MG tablet Take 1 tablet (1 mg total) by mouth daily. 08/22/12   Alison Murray, MD  ibuprofen (ADVIL,MOTRIN) 200 MG tablet Take 200 mg by mouth every 6 (six) hours as needed for pain.    Historical Provider, MD    Family History Family History  Problem Relation Age of Onset  . Lung cancer Mother     Social History Social History  Substance Use Topics  . Smoking status: Never Smoker  . Smokeless tobacco:  Never Used  . Alcohol use No     Allergies   Dilaudid [hydromorphone hcl]; Hydromorphone; and Cefotaxime   Review of Systems Review of Systems  Constitutional: Negative for fever.  Respiratory: Negative for cough and shortness of breath.   Cardiovascular: Positive for chest pain. Negative for leg swelling.  Gastrointestinal: Negative for nausea and vomiting.  All other systems reviewed and are negative.    Physical Exam Updated Vital Signs BP 136/83 (BP Location: Right Arm)   Pulse 64   Temp 98.4 F (36.9 C) (Oral)   Resp 20   Ht 6' (1.829 m)   Wt 180 lb (81.6 kg)   SpO2 100%   BMI 24.41 kg/m   Physical Exam  Constitutional: He is oriented to person, place, and time. He appears well-developed and well-nourished. No distress.  HENT:  Head: Normocephalic and atraumatic.  Cardiovascular: Normal rate, regular rhythm and normal heart sounds.   No murmur heard. Pulmonary/Chest: Effort normal and breath sounds normal. No respiratory distress. He has no wheezes. He exhibits no tenderness.  Abdominal: Soft. Bowel sounds are normal. There is no tenderness. There is no rebound.  Neurological: He is alert and oriented to person, place, and time.  Skin: Skin is warm and dry.  Psychiatric: He has a normal mood and affect.  Nursing note and vitals reviewed.    ED Treatments / Results  Labs (all labs ordered are listed, but only abnormal results are displayed) Labs Reviewed  CBC WITH DIFFERENTIAL/PLATELET - Abnormal; Notable for the following:       Result Value   WBC 10.9 (*)    Hemoglobin 11.4 (*)    HCT 32.9 (*)    MCV 73.4 (*)    MCH 25.4 (*)    RDW 18.7 (*)    All other components within normal limits  BASIC METABOLIC PANEL - Abnormal; Notable for the following:    Glucose, Bld 106 (*)    BUN <5 (*)    Calcium 8.7 (*)    All other components within normal limits  RETICULOCYTES    EKG  EKG Interpretation  Date/Time:  Thursday May 27 2016 06:22:48  EDT Ventricular Rate:  84 PR Interval:    QRS Duration: 96 QT Interval:  372 QTC Calculation: 440 R Axis:   61 Text Interpretation:  Sinus rhythm ST elev, probable normal early repol pattern No significant change since last tracing Confirmed by HORTON  MD, Toni Amend ([id number]) on 05/27/2016 6:25:48 AM Also confirmed by Wilkie Aye  MD, COURTNEY ([id number]), editor [place]  CCT, BEVERLY ([id number])  on 05/27/2016 7:01:53 AM       Radiology No results found.  Procedures Procedures (including critical care time)  Medications Ordered in ED Medications  morphine 4 MG/ML injection 8 mg (8 mg Intravenous Given 05/27/16 0704)  ketorolac (TORADOL) 30 MG/ML injection 30 mg (30 mg Intravenous Given 05/27/16 0702)  sodium chloride 0.9 % bolus 1,000 mL (1,000 mLs Intravenous New Bag/Given 05/27/16 0708)     Initial Impression / Assessment and Plan / ED Course  I have reviewed the triage vital signs and the nursing notes.  Pertinent labs & imaging results that were available during my care of the patient were reviewed by me and considered in my medical decision making (see chart for details).  Clinical Course as of May 27 733  Thu May 27, 2016  0981 After 1 dose of pain medication, patient reports some improvement. Will await chest x-ray and provide additional pain medication. If continued improvement, anticipate discharge.  [CH]    Clinical Course User Index [CH] Shon Baton, MD    Patient presents with pain related to sickle cell anemia. Also reports chest pain which is atypical for him. O2 sats 100%. Afebrile. Will obtain chest x-ray to rule out acute chest. Patient ordered pain medication and basic labwork obtained.  Final Clinical Impressions(s) / ED Diagnoses   Final diagnoses:  Atypical chest pain  Sickle cell crisis [facility])    New Prescriptions New Prescriptions   No medications on file     Shon Baton, MD 05/27/16 [phone]

## 2016-05-27 NOTE — ED Provider Notes (Signed)
Patient with infiltrate on chest x-ray. Given the fact that he has sickle cell infiltrate and symptoms of pneumonia, we'll treat for CAP/rule out acute chestt. Patient will be treated with Levaquin.  Patient usually sees Dr. Pamalee Leyden at [facility]. We'll discuss with PALS line for admission.  No beds at [facility] right now.  Will admit to [facility]. 12:20 PM Discussed with Dr. Ashley Royalty, hematologist at [facility]. She reports that if he has no oxygen requirements and no tachypnea patient to be treated as an outpatient for community acquired pneumonia if his pain is controlled.  Pain is been controlled with his home medications. We'll discharge home with Levaquin to follow up with Dr. Pamalee Leyden as needed as an outpatient. Strict return precautions given.   Darya Bigler Randall An, MD 05/27/16 1221

## 2016-07-29 DIAGNOSIS — M25512 Pain in left shoulder: Secondary | ICD-10-CM | POA: Insufficient documentation

## 2016-12-11 ENCOUNTER — Emergency Department (HOSPITAL_BASED_OUTPATIENT_CLINIC_OR_DEPARTMENT_OTHER)
Admission: EM | Admit: 2016-12-11 | Discharge: 2016-12-11 | Disposition: A | Discharge status: Home / Self Care | Payer: Self-pay | Attending: Emergency Medicine | Admitting: Emergency Medicine

## 2016-12-11 ENCOUNTER — Encounter (HOSPITAL_BASED_OUTPATIENT_CLINIC_OR_DEPARTMENT_OTHER): Payer: Self-pay | Admitting: *Deleted

## 2016-12-11 DIAGNOSIS — D5700 Hb-SS disease with crisis, unspecified: Secondary | ICD-10-CM | POA: Insufficient documentation

## 2016-12-11 DIAGNOSIS — D849 Immunodeficiency, unspecified: Secondary | ICD-10-CM | POA: Insufficient documentation

## 2016-12-11 DIAGNOSIS — Z79899 Other long term (current) drug therapy: Secondary | ICD-10-CM | POA: Insufficient documentation

## 2016-12-11 LAB — CBC WITH DIFFERENTIAL/PLATELET
BASOS ABS: 0 10*3/uL (ref 0.0–0.1)
BASOS PCT: 0 %
EOS ABS: 0.2 10*3/uL (ref 0.0–0.7)
Eosinophils Relative: 2 %
HCT: 34.6 % — ABNORMAL LOW (ref 39.0–52.0)
Hemoglobin: 12.1 g/dL — ABNORMAL LOW (ref 13.0–17.0)
Lymphocytes Relative: 14 %
Lymphs Abs: 1.6 10*3/uL (ref 0.7–4.0)
MCH: 24.2 pg — ABNORMAL LOW (ref 26.0–34.0)
MCHC: 35 g/dL (ref 30.0–36.0)
MCV: 69.3 fL — ABNORMAL LOW (ref 78.0–100.0)
MONOS PCT: 15 %
Monocytes Absolute: 1.7 10*3/uL — ABNORMAL HIGH (ref 0.1–1.0)
NEUTROS ABS: 7.8 10*3/uL — AB (ref 1.7–7.7)
NEUTROS PCT: 69 %
PLATELETS: 162 10*3/uL (ref 150–400)
RBC: 4.99 MIL/uL (ref 4.22–5.81)
RDW: 15.2 % (ref 11.5–15.5)
WBC: 11.2 10*3/uL — AB (ref 4.0–10.5)

## 2016-12-11 LAB — COMPREHENSIVE METABOLIC PANEL
ALK PHOS: 64 U/L (ref 38–126)
ALT: 15 U/L — AB (ref 17–63)
AST: 25 U/L (ref 15–41)
Albumin: 4.6 g/dL (ref 3.5–5.0)
Anion gap: 7 (ref 5–15)
BUN: 6 mg/dL (ref 6–20)
CALCIUM: 9.1 mg/dL (ref 8.9–10.3)
CHLORIDE: 105 mmol/L (ref 101–111)
CO2: 25 mmol/L (ref 22–32)
CREATININE: 0.87 mg/dL (ref 0.61–1.24)
Glucose, Bld: 97 mg/dL (ref 65–99)
Potassium: 4.3 mmol/L (ref 3.5–5.1)
Sodium: 137 mmol/L (ref 135–145)
Total Bilirubin: 1.6 mg/dL — ABNORMAL HIGH (ref 0.3–1.2)
Total Protein: 8.4 g/dL — ABNORMAL HIGH (ref 6.5–8.1)

## 2016-12-11 LAB — RETICULOCYTES
RBC.: 5.04 MIL/uL (ref 4.22–5.81)
Retic Count, Absolute: 166.3 10*3/uL (ref 19.0–186.0)
Retic Ct Pct: 3.3 % — ABNORMAL HIGH (ref 0.4–3.1)

## 2016-12-11 MED ORDER — SODIUM CHLORIDE 0.45 % IV SOLN
INTRAVENOUS | Status: DC
  Administered 2016-12-11: 06:00:00 via INTRAVENOUS

## 2016-12-11 MED ORDER — MORPHINE SULFATE (PF) 4 MG/ML IV SOLN
4.0000 mg | INTRAVENOUS | Status: DC | PRN
  Administered 2016-12-11 (×3): 4 mg via INTRAVENOUS
  Filled 2016-12-11 (×3): qty 1

## 2016-12-11 MED ORDER — DIPHENHYDRAMINE HCL 25 MG PO CAPS
25.0000 mg | ORAL_CAPSULE | Freq: Once | ORAL | Status: AC
  Administered 2016-12-11: 25 mg via ORAL
  Filled 2016-12-11: qty 1

## 2016-12-11 MED ORDER — KETOROLAC TROMETHAMINE 15 MG/ML IJ SOLN
15.0000 mg | Freq: Once | INTRAMUSCULAR | Status: AC
  Administered 2016-12-11: 15 mg via INTRAVENOUS
  Filled 2016-12-11: qty 1

## 2016-12-11 NOTE — ED Notes (Signed)
Pt educated about not driving or performing other critical tasks (such as operating heavy machinery, caring for infant/toddler/child) due to sedative nature of medications received in ED. Also warned about risks of consuming alcohol or taking other medications with sedative properties. Pt/caregiver verbalized understanding.  

## 2016-12-11 NOTE — ED Triage Notes (Signed)
Pt reports sickle crisis x 8 hours upper back and right arm pain

## 2016-12-11 NOTE — ED Provider Notes (Signed)
[facility] EMERGENCY DEPARTMENT Provider Note   CSN: [id number] Arrival date & time: 12/11/16  [id number]     History   Chief Complaint Chief Complaint  Patient presents with  . Sickle Cell Pain Crisis    HPI Paul Campos is a 26 y.o. male.  HPI  Patient comes in with chief complaint of back pain and arm pain. Patient has history of sickle cell anemia. Patient reports that he takes breakthrough pain medications when he has sickle cell sclera. Patient's current pain started last night around 10 PM. Patient has taken 2 rounds of 20 mg immediate release oxycodone without significant relief. Patient suspects that the weather changes the cause for his pain. Last admission was within the last 3 months. Patient denies any nausea, vomiting, fevers, chills.He also denies any chest pain or cough. [facility]   Past Medical History:  Diagnosis Date  . Sickle cell anemia (HCC)   . Sickle cell anemia [facility](HCC)     Patient Active Problem List   Diagnosis Date Noted  . Hypokalemia 08/21/2012  . Thrombocytopenia, unspecified (HCC) 08/19/2012  . CAP (community acquired pneumonia) 08/18/2012  . Sickle cell pain crisis (HCC) 05/06/2012  . Chest pain 05/06/2012    Past Surgical History:  Procedure Laterality Date  . APPENDECTOMY         Home Medications    Prior to Admission medications   Medication Sig Start Date End Date Taking? Authorizing Provider  folic acid (FOLVITE) 1 MG tablet Take 1 tablet (1 mg total) by mouth daily. 08/22/12   Alison Murrayevine, Alma M, MD  hydroxyurea (HYDREA) 500 MG capsule Take 500 mg by mouth daily. May take with food to minimize GI side effects.    [provider]  ibuprofen (ADVIL,MOTRIN) 200 MG tablet Take 200 mg by mouth every 6 (six) hours as needed for pain.    [provider]    Family History Family History  Problem Relation Age of Onset  . Lung cancer Mother     Social History Social History    Substance Use Topics  . Smoking status: Never Smoker  . Smokeless tobacco: Never Used  . Alcohol use No     Allergies   Dilaudid [hydromorphone hcl]; Hydromorphone; and Cefotaxime   Review of Systems Review of Systems  Constitutional: Positive for activity change. Negative for fever.  Respiratory: Negative for shortness of breath.   Cardiovascular: Negative for chest pain.  Musculoskeletal: Positive for back pain and myalgias.  Allergic/Immunologic: Positive for immunocompromised state.  All other systems reviewed and are negative.    Physical Exam Updated Vital Signs BP 116/76   Pulse 87   Temp 99.5 F (37.5 C) (Oral)   Resp (!) 24   Ht 6' (1.829 m)   Wt 83 kg (183 lb)   SpO2 95%   BMI 24.82 kg/m   Physical Exam  Constitutional: He is oriented to person, place, and time. He appears well-developed.  HENT:  Head: Atraumatic.  Neck: Neck supple.  Cardiovascular: Normal rate.   Pulmonary/Chest: Effort normal.  Abdominal: Soft.  Musculoskeletal: He exhibits tenderness.  Neurological: He is alert and oriented to person, place, and time.  Skin: Skin is warm.  Nursing note and vitals reviewed.    ED Treatments / Results  Labs (all labs ordered are listed, but only abnormal results are displayed) Labs Reviewed  CBC WITH DIFFERENTIAL/PLATELET - Abnormal; Notable for the following:       Result Value  WBC 11.2 (*)    Hemoglobin 12.1 (*)    HCT 34.6 (*)    MCV 69.3 (*)    MCH 24.2 (*)    Neutro Abs 7.8 (*)    Monocytes Absolute 1.7 (*)    All other components within normal limits  COMPREHENSIVE METABOLIC PANEL - Abnormal; Notable for the following:    Total Protein 8.4 (*)    ALT 15 (*)    Total Bilirubin 1.6 (*)    All other components within normal limits  RETICULOCYTES    EKG  EKG Interpretation None       Radiology No results found.  Procedures Procedures (including critical care time)  Medications Ordered in ED Medications  0.45 %  sodium chloride infusion ( Intravenous New Bag/Given 12/11/16 0616)  morphine 4 MG/ML injection 4 mg (4 mg Intravenous Given 12/11/16 0717)  diphenhydrAMINE (BENADRYL) capsule 25 mg (25 mg Oral Given 12/11/16 0716)  ketorolac (TORADOL) 15 MG/ML injection 15 mg (15 mg Intravenous Given 12/11/16 4098)     Initial Impression / Assessment and Plan / ED Course  I have reviewed the triage vital signs and the nursing notes.  Pertinent labs & imaging results that were available during my care of the patient were reviewed by me and considered in my medical decision making (see chart for details).    Patient with history of sickle cell anemia comes in with chief complaint of pain that is typical of his sickle cell. Pain is located in the back and shoulder which are the typical sites for sickle cell pain. He suspects that the pain is secondary to weather change. Patient has tried his breakthrough pain medications without any resolution of symptoms.  Examination reveals no focal spine tenderness, and the range of motion or the shoulder though limited does not reveal any stiffness or rigidity.  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  Patient gets his care outside hospital system and his care plan shows the following treatment plan:  Acute Painful Episode: ED: Morphine 4 mg IV Every 1 hour for three doses initially. Inpatient Admission: Morphine 4 mg IV Every 2 hours as needed for moderate pain. Inpatient Admission: Morphine 8 mg IV Every 2 hours as needed for severe pain. Benadryl 25 mg PO Every 6 hours as needed for itching. Hydrocortisone 1% cream topical 4 times daily if needed. Zofran 4 mg IV Every 4 hours as needed for nausea.   We'll start pain management as per the usual care plan for the patient. Incoming team will assume care of the patient in follow-up with him.    Final Clinical Impressions(s) / ED Diagnoses   Final diagnoses:  Sickle  cell pain crisis [facility])    New Prescriptions New Prescriptions   No medications on file     Derwood Kaplan, MD 12/11/16 0800

## 2016-12-11 NOTE — ED Provider Notes (Signed)
Pt signed out by Dr. Rhunette CroftNanavati awaiting pain relief of patient.  After 3 doses of 4 mg of morphine, pt is feeling much better.  He feels well enough to go home.  Pt is stable for d/c.   Jacalyn LefevreHaviland, Chonda Baney, MD 12/11/16 [phone]

## 2016-12-14 ENCOUNTER — Emergency Department (HOSPITAL_BASED_OUTPATIENT_CLINIC_OR_DEPARTMENT_OTHER): Payer: Self-pay

## 2016-12-14 ENCOUNTER — Encounter (HOSPITAL_BASED_OUTPATIENT_CLINIC_OR_DEPARTMENT_OTHER): Payer: Self-pay | Admitting: Emergency Medicine

## 2016-12-14 ENCOUNTER — Emergency Department (HOSPITAL_BASED_OUTPATIENT_CLINIC_OR_DEPARTMENT_OTHER)
Admission: EM | Admit: 2016-12-14 | Discharge: 2016-12-14 | Disposition: A | Discharge status: Home / Self Care | Payer: Self-pay | Attending: Emergency Medicine | Admitting: Emergency Medicine

## 2016-12-14 DIAGNOSIS — M25531 Pain in right wrist: Secondary | ICD-10-CM | POA: Insufficient documentation

## 2016-12-14 DIAGNOSIS — Z79899 Other long term (current) drug therapy: Secondary | ICD-10-CM | POA: Insufficient documentation

## 2016-12-14 MED ORDER — MORPHINE SULFATE (PF) 4 MG/ML IV SOLN
8.0000 mg | Freq: Once | INTRAVENOUS | Status: AC
  Administered 2016-12-14: 8 mg via INTRAMUSCULAR
  Filled 2016-12-14: qty 2

## 2016-12-14 MED ORDER — OXYCODONE-ACETAMINOPHEN 5-325 MG PO TABS
1.0000 | ORAL_TABLET | ORAL | Status: DC | PRN
  Administered 2016-12-14: 1 via ORAL
  Filled 2016-12-14: qty 1

## 2016-12-14 NOTE — Discharge Instructions (Signed)
Your x-ray is negative for broken bone. Sometimes the x-ray can miss certain broken bones of the wrist. You are having pain where we might expect a scaphoid bone fracture and that is why you are splinted.  You need to call the hand surgeon, Dr. Amanda PeaGramig and arrange a follow-up appointment where he will re-examine and re-xray your wrist. Continue to take your home oxycodone for pain control.
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
## 2016-12-14 NOTE — ED Notes (Addendum)
Applied a SM ice pack to Rt wrist and blanket underneath Rt wrist for support.
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
## 2017-10-26 ENCOUNTER — Ambulatory Visit (INDEPENDENT_AMBULATORY_CARE_PROVIDER_SITE_OTHER): Payer: Self-pay | Admitting: Family Medicine

## 2017-10-26 ENCOUNTER — Other Ambulatory Visit: Payer: Self-pay

## 2017-10-26 ENCOUNTER — Encounter: Payer: Self-pay | Admitting: Family Medicine

## 2017-10-26 VITALS — BP 123/63 | HR 59 | Temp 98.8°F | Ht 72.0 in | Wt 180.0 lb

## 2017-10-26 DIAGNOSIS — G8929 Other chronic pain: Secondary | ICD-10-CM

## 2017-10-26 DIAGNOSIS — D571 Sickle-cell disease without crisis: Secondary | ICD-10-CM

## 2017-10-26 LAB — POCT URINALYSIS DIP (MANUAL ENTRY)
Bilirubin, UA: NEGATIVE
Glucose, UA: NEGATIVE mg/dL
Ketones, POC UA: NEGATIVE mg/dL
Nitrite, UA: NEGATIVE
Protein Ur, POC: NEGATIVE mg/dL
Spec Grav, UA: 1.02 (ref 1.010–1.025)
Urobilinogen, UA: 0.2 E.U./dL
pH, UA: 6 (ref 5.0–8.0)

## 2017-10-26 MED ORDER — FOLIC ACID 1 MG PO TABS: 1.0000 mg | ORAL_TABLET | Freq: Every day | ORAL | 11 refills | Status: DC

## 2017-10-26 MED ORDER — VITAMIN D (ERGOCALCIFEROL) 1.25 MG (50000 UNIT) PO CAPS: 50000.0000 [IU] | ORAL_CAPSULE | ORAL | 0 refills | Status: AC

## 2017-10-26 MED ORDER — IBUPROFEN 800 MG PO TABS
800.0000 mg | ORAL_TABLET | Freq: Three times a day (TID) | ORAL | 0 refills | Status: DC | PRN
Start: 2017-10-26 — End: 2017-11-11

## 2017-10-26 MED ORDER — HYDROXYUREA 500 MG PO CAPS: 1000.0000 mg | ORAL_CAPSULE | Freq: Every day | ORAL | 2 refills | Status: DC

## 2017-10-26 MED ORDER — FOLIC ACID 1 MG PO TABS: 1.0000 mg | ORAL_TABLET | Freq: Every day | ORAL | 0 refills | Status: DC

## 2017-10-26 MED ORDER — OXYCODONE HCL 20 MG PO TABS: 20.0000 mg | ORAL_TABLET | Freq: Four times a day (QID) | ORAL | 0 refills | Status: DC | PRN

## 2017-10-26 NOTE — Progress Notes (Signed)
[facility]  New Patient Encounter Provider: Mike Gip, FNP    ZOX:[id number]  [id number]:[id number]  DOB - 1990/06/23  SUBJECTIVE:   Paul Campos, is a 27 y.o. male who presents to establish care with this clinic.  has a past medical history of Sickle cell anemia (HCC) and Sickle cell anemia (HCC).   Current problems/concerns:   Patient presents with bilateral pain in the upper extremities. 5/10 ache that is constant. Patient states that he has had 10 pain episodes in the past 6 months. Last hospitalization 09/15/2017. States that he is on hydrea and was followed by hematologist at [facility]. Patient states that he is no longer living in [place] and is transferring care to this clinic. Was being seen at pain management in [place].   Allergies  Allergen Reactions  . Dilaudid [Hydromorphone Hcl] Hives  . Hydromorphone Itching    Morphine okay.  . Cefotaxime Itching and Rash    Hives   Past Medical History:  Diagnosis Date  . Sickle cell anemia (HCC)   . Sickle cell anemia (HCC)    No current outpatient medications on file prior to visit.   No current facility-administered medications on file prior to visit.    Family History  Problem Relation Age of Onset  . Lung cancer Mother    Social History   Socioeconomic History  . Marital status: Single    Spouse name: Not on file  . Number of children: Not on file  . Years of education: Not on file  . Highest education level: Not on file  Occupational History  . Not on file  Social Needs  . Financial resource strain: Not on file  . Food insecurity:    Worry: Not on file    Inability: Not on file  . Transportation needs:    Medical: Not on file    Non-medical: Not on file  Tobacco Use  . Smoking status: Never Smoker  . Smokeless tobacco: Never Used  Substance and Sexual Activity  . Alcohol use: No  . Drug use: No  . Sexual activity: Not on file  Lifestyle  . Physical  activity:    Days per week: Not on file    Minutes per session: Not on file  . Stress: Not on file  Relationships  . Social connections:    Talks on phone: Not on file    Gets together: Not on file    Attends religious service: Not on file    Active member of club or organization: Not on file    Attends meetings of clubs or organizations: Not on file    Relationship status: Not on file  . Intimate partner violence:    Fear of current or ex partner: Not on file    Emotionally abused: Not on file    Physically abused: Not on file    Forced sexual activity: Not on file  Other Topics Concern  . Not on file  Social History Narrative  . Not on file    Review of Systems  Constitutional: Negative.   HENT: Negative.   Eyes: Negative.   Respiratory: Negative.   Cardiovascular: Negative.   Gastrointestinal: Negative.   Genitourinary: Negative.   Musculoskeletal: Positive for joint pain and myalgias (UPPER EXTREMITIES).  Skin: Negative.   Neurological: Negative.   Psychiatric/Behavioral: Negative.      OBJECTIVE:    BP 123/63 (BP Location: Right Arm, Patient Position: Sitting, Cuff Size: Normal)   Pulse Marland Kitchen)  59   Temp 98.8 F (37.1 C) (Oral)   Ht 6' (1.829 m)   Wt 180 lb (81.6 kg)   SpO2 100%   BMI 24.41 kg/m   Physical Exam  Constitutional: He is oriented to person, place, and time and well-developed, well-nourished, and in no distress. No distress.  HENT:  Head: Normocephalic and atraumatic.  Eyes: Pupils are equal, round, and reactive to light. Conjunctivae and EOM are normal.  Neck: Normal range of motion. Neck supple.  Cardiovascular: Normal rate, regular rhythm and intact distal pulses. Exam reveals no gallop and no friction rub.  No murmur heard. Pulmonary/Chest: Effort normal and breath sounds normal. No respiratory distress. He has no wheezes.  Abdominal: Soft. Bowel sounds are normal. There is no tenderness.  Musculoskeletal: Normal range of motion. He exhibits  tenderness (UPPER EXTREMITIES). He exhibits no edema.  Lymphadenopathy:    He has no cervical adenopathy.  Neurological: He is alert and oriented to person, place, and time. Gait normal.  Skin: Skin is warm and dry.  Psychiatric: Mood, memory, affect and judgment normal.  Nursing note and vitals reviewed.    ASSESSMENT/PLAN: 1. Hb-SS disease without crisis (HCC) Increased hydroxyurea to 1000mg  per day.  Discussed the importance of hydration.  Ibuprofen increased from 200mg  to 800mg  PO TID prn - POCT urinalysis dipstick - Ambulatory referral to Ophthalmology - CBC With Differential - Comprehensive metabolic panel - VITAMIN D 25 Hydroxy (Vit-D Deficiency, Fractures) - Reticulocytes - Hemoglobinopathy Evaluation - hydroxyurea (HYDREA) 500 MG capsule; Take 2 capsules (1,000 mg total) by mouth daily. May take with food to minimize GI side effects.  Dispense: 60 capsule; Refill: 2 - ibuprofen (ADVIL,MOTRIN) 800 MG tablet; Take 1 tablet (800 mg total) by mouth every 8 (eight) hours as needed.  Dispense: 30 tablet; Refill: 0 - Vitamin D, Ergocalciferol, (DRISDOL) 50000 units CAPS capsule; Take 1 capsule (50,000 Units total) by mouth every 7 (seven) days.  Dispense: 8 capsule; Refill: 0 - Oxycodone HCl 20 MG TABS; Take 1 tablet (20 mg total) by mouth every 6 (six) hours as needed for up to 15 days.  Dispense: 60 tablet; Refill: 0 - folic acid (FOLVITE) 1 MG tablet; Take 1 tablet (1 mg total) by mouth daily.  Dispense: 30 tablet; Refill: 11  2. Other chronic pain Medications refilled.  - [id number] 9+OXYCODONE+CRT-UNBUND    Return in about 4 weeks (around 11/23/2017).  The patient was given clear instructions to go to ER or return to medical center if symptoms don't improve, worsen or new problems develop. The patient verbalized understanding. The patient was told to call to get lab results if they haven't heard anything in the next week.     This note has been created with Engineer, agricultural. Any transcriptional errors are unintentional.   Ms. Andr L. Riley Lam, FNP-BC [facility] [address] [place], [place] [address] [phone]

## 2017-10-26 NOTE — Patient Instructions (Signed)
Sickle Cell Anemia, Adult °Sickle cell anemia is a condition where your red blood cells are shaped like sickles. Red blood cells carry oxygen through the body. Sickle-shaped red blood cells do not live as long as normal red blood cells. They also clump together and block blood from flowing through the blood vessels. These things prevent the body from getting enough oxygen. Sickle cell anemia causes organ damage and pain. It also increases the risk of infection. °Follow these instructions at home: °· Drink enough fluid to keep your pee (urine) clear or pale yellow. Drink more in hot weather and during exercise. °· Do not smoke. Smoking lowers oxygen levels in the blood. °· Only take over-the-counter or prescription medicines as told by your doctor. °· Take antibiotic medicines as told by your doctor. Make sure you finish them even if you start to feel better. °· Take supplements as told by your doctor. °· Consider wearing a medical alert bracelet. This tells anyone caring for you in an emergency of your condition. °· When traveling, keep your medical information, doctors' names, and the medicines you take with you at all times. °· If you have a fever, do not take fever medicines right away. This could cover up a problem. Tell your doctor. °· Keep all follow-up visits with your doctor. Sickle cell anemia requires regular medical care. °Contact a doctor if: °You have a fever. °Get help right away if: °· You feel dizzy or faint. °· You have new belly (abdominal) pain, especially on the left side near the stomach area. °· You have a lasting, often uncomfortable and painful erection of the penis (priapism). If it is not treated right away, you will become unable to have sex (impotence). °· You have numbness in your arms or legs or you have a hard time moving them. °· You have a hard time talking. °· You have a fever or lasting symptoms for more than 2-3 days. °· You have a fever and your symptoms suddenly get  worse. °· You have signs or symptoms of infection. These include: °? Chills. °? Being more tired than normal (lethargy). °? Irritability. °? Poor eating. °? Throwing up (vomiting). °· You have pain that is not helped with medicine. °· You have shortness of breath. °· You have pain in your chest. °· You are coughing up pus-like or bloody mucus. °· You have a stiff neck. °· Your feet or hands swell or have pain. °· Your belly looks bloated. °· Your joints hurt. °This information is not intended to replace advice given to you by your health care provider. Make sure you discuss any questions you have with your health care provider. °Document Released: 11/29/2012 Document Revised: 07/17/2015 Document Reviewed: 09/20/2012 °Elsevier Interactive Patient Education © 2017 Elsevier Inc. ° °

## 2017-10-29 LAB — 737588 9+OXYCODONE+CRT-UNBUND
Amphetamine Scrn, Ur: NEGATIVE ng/mL
BARBITURATE SCREEN URINE: NEGATIVE ng/mL
BENZODIAZEPINE SCREEN, URINE: NEGATIVE ng/mL
Cocaine (Metab) Scrn, Ur: NEGATIVE ng/mL
Creatinine(Crt), U: 195.3 mg/dL (ref 20.0–300.0)
Methadone Screen, Urine: NEGATIVE ng/mL
OXYCODONE+OXYMORPHONE UR QL SCN: NEGATIVE ng/mL
Opiate Scrn, Ur: NEGATIVE ng/mL
Ph of Urine: 6.1 (ref 4.5–8.9)
Phencyclidine Qn, Ur: NEGATIVE ng/mL
Propoxyphene Scrn, Ur: NEGATIVE ng/mL

## 2017-10-29 LAB — CANNABINOID (GC/MS), URINE
Cannabinoid: POSITIVE — AB
Carboxy THC (GC/MS): 107 ng/mL

## 2017-11-08 LAB — CBC WITH DIFFERENTIAL
Basophils Absolute: 0 10*3/uL (ref 0.0–0.2)
Basos: 0 %
EOS (ABSOLUTE): 0.2 10*3/uL (ref 0.0–0.4)
Eos: 3 %
Immature Grans (Abs): 0 10*3/uL (ref 0.0–0.1)
Immature Granulocytes: 0 %
Lymphocytes Absolute: 1.3 10*3/uL (ref 0.7–3.1)
Lymphs: 24 %
Monocytes Absolute: 0.6 10*3/uL (ref 0.1–0.9)
Monocytes: 11 %
Neutrophils Absolute: 3.3 10*3/uL (ref 1.4–7.0)
Neutrophils: 62 %

## 2017-11-08 LAB — HEMOGLOBINOPATHY EVALUATION
Ferritin: 283 ng/mL (ref 30–400)
Hematocrit: 37 % — ABNORMAL LOW (ref 37.5–51.0)
Hemoglobin: 12.7 g/dL — ABNORMAL LOW (ref 13.0–17.7)
Hgb A2 Quant: 2.9 % (ref 1.8–3.2)
Hgb A: 0 % — ABNORMAL LOW (ref 96.4–98.8)
Hgb C: 0 %
Hgb F Quant: 21.8 % — ABNORMAL HIGH (ref 0.0–2.0)
Hgb S: 75.3 % — ABNORMAL HIGH
Hgb Solubility: POSITIVE — AB
Hgb Variant: 0 %
MCH: 25.8 pg — ABNORMAL LOW (ref 26.6–33.0)
MCHC: 34.3 g/dL (ref 31.5–35.7)
MCV: 75 fL — ABNORMAL LOW (ref 79–97)
Platelets: 80 10*3/uL — CL (ref 150–450)
RBC: 4.93 x10E6/uL (ref 4.14–5.80)
RDW: 15.5 % — ABNORMAL HIGH (ref 12.3–15.4)
WBC: 5.2 10*3/uL (ref 3.4–10.8)

## 2017-11-08 LAB — COMPREHENSIVE METABOLIC PANEL
ALT: 17 IU/L (ref 0–44)
AST: 23 IU/L (ref 0–40)
Albumin/Globulin Ratio: 1.6 (ref 1.2–2.2)
Albumin: 4.6 g/dL (ref 3.5–5.5)
Alkaline Phosphatase: 61 IU/L (ref 39–117)
BUN/Creatinine Ratio: 7 — ABNORMAL LOW (ref 9–20)
BUN: 6 mg/dL (ref 6–20)
Bilirubin Total: 1.3 mg/dL — ABNORMAL HIGH (ref 0.0–1.2)
CO2: 24 mmol/L (ref 20–29)
Calcium: 9.3 mg/dL (ref 8.7–10.2)
Chloride: 102 mmol/L (ref 96–106)
Creatinine, Ser: 0.85 mg/dL (ref 0.76–1.27)
GFR calc Af Amer: 140 mL/min/{1.73_m2} (ref >59)
GFR calc non Af Amer: 121 mL/min/{1.73_m2} (ref >59)
Globulin, Total: 2.8 g/dL (ref 1.5–4.5)
Glucose: 80 mg/dL (ref 65–99)
Potassium: 4.2 mmol/L (ref 3.5–5.2)
Sodium: 141 mmol/L (ref 134–144)
Total Protein: 7.4 g/dL (ref 6.0–8.5)

## 2017-11-08 LAB — VITAMIN D 25 HYDROXY (VIT D DEFICIENCY, FRACTURES): Vit D, 25-Hydroxy: 21.9 ng/mL — ABNORMAL LOW (ref 30.0–100.0)

## 2017-11-08 LAB — ALPHA-THALASSEMIA

## 2017-11-08 LAB — RETICULOCYTES: Retic Ct Pct: 3.5 % — ABNORMAL HIGH (ref 0.6–2.6)

## 2017-11-09 ENCOUNTER — Other Ambulatory Visit: Payer: Self-pay | Admitting: Family Medicine

## 2017-11-09 MED ORDER — VITAMIN D (ERGOCALCIFEROL) 1.25 MG (50000 UNIT) PO CAPS: 50000.0000 [IU] | ORAL_CAPSULE | ORAL | 5 refills | Status: DC

## 2017-11-11 DIAGNOSIS — D571 Sickle-cell disease without crisis: Secondary | ICD-10-CM

## 2017-11-11 MED ORDER — IBUPROFEN 800 MG PO TABS: 800.0000 mg | ORAL_TABLET | Freq: Three times a day (TID) | ORAL | 1 refills | Status: DC | PRN

## 2017-11-11 MED ORDER — OXYCODONE HCL 20 MG PO TABS: 20.0000 mg | ORAL_TABLET | Freq: Four times a day (QID) | ORAL | 0 refills | Status: DC | PRN

## 2017-11-11 NOTE — Telephone Encounter (Signed)
Refill request for oxycodone, vitamin D and ibuprofen. Please advise. Thanks!

## 2017-11-23 ENCOUNTER — Ambulatory Visit: Payer: Self-pay | Admitting: Family Medicine

## 2017-12-01 ENCOUNTER — Emergency Department (HOSPITAL_BASED_OUTPATIENT_CLINIC_OR_DEPARTMENT_OTHER)
Admission: EM | Admit: 2017-12-01 | Discharge: 2017-12-02 | Disposition: A | Discharge status: Home / Self Care | Payer: Self-pay | Attending: Emergency Medicine | Admitting: Emergency Medicine

## 2017-12-01 ENCOUNTER — Other Ambulatory Visit: Payer: Self-pay

## 2017-12-01 ENCOUNTER — Encounter (HOSPITAL_BASED_OUTPATIENT_CLINIC_OR_DEPARTMENT_OTHER): Payer: Self-pay | Admitting: *Deleted

## 2017-12-01 DIAGNOSIS — D57411 Sickle-cell thalassemia with acute chest syndrome: Secondary | ICD-10-CM | POA: Insufficient documentation

## 2017-12-01 DIAGNOSIS — D5700 Hb-SS disease with crisis, unspecified: Secondary | ICD-10-CM

## 2017-12-01 DIAGNOSIS — Z79899 Other long term (current) drug therapy: Secondary | ICD-10-CM | POA: Insufficient documentation

## 2017-12-01 LAB — CBC WITH DIFFERENTIAL/PLATELET
Abs Immature Granulocytes: 0.02 10*3/uL (ref 0.00–0.07)
BASOS PCT: 0 %
Basophils Absolute: 0 10*3/uL (ref 0.0–0.1)
EOS ABS: 0.3 10*3/uL (ref 0.0–0.5)
Eosinophils Relative: 5 %
HCT: 36.4 % — ABNORMAL LOW (ref 39.0–52.0)
Hemoglobin: 12.2 g/dL — ABNORMAL LOW (ref 13.0–17.0)
Immature Granulocytes: 0 %
Lymphocytes Relative: 41 %
Lymphs Abs: 2.5 10*3/uL (ref 0.7–4.0)
MCH: 24.5 pg — AB (ref 26.0–34.0)
MCHC: 33.5 g/dL (ref 30.0–36.0)
MCV: 73.1 fL — AB (ref 80.0–100.0)
MONO ABS: 0.7 10*3/uL (ref 0.1–1.0)
MONOS PCT: 11 %
Neutro Abs: 2.7 10*3/uL (ref 1.7–7.7)
Neutrophils Relative %: 43 %
PLATELETS: 120 10*3/uL — AB (ref 150–400)
RBC: 4.98 MIL/uL (ref 4.22–5.81)
RDW: 14.3 % (ref 11.5–15.5)
WBC: 6.2 10*3/uL (ref 4.0–10.5)
nRBC: 0 % (ref 0.0–0.2)

## 2017-12-01 LAB — COMPREHENSIVE METABOLIC PANEL
ALT: 20 U/L (ref 0–44)
ANION GAP: 8 (ref 5–15)
AST: 28 U/L (ref 15–41)
Albumin: 4.5 g/dL (ref 3.5–5.0)
Alkaline Phosphatase: 57 U/L (ref 38–126)
BUN: 8 mg/dL (ref 6–20)
CHLORIDE: 104 mmol/L (ref 98–111)
CO2: 26 mmol/L (ref 22–32)
Calcium: 9.2 mg/dL (ref 8.9–10.3)
Creatinine, Ser: 0.81 mg/dL (ref 0.61–1.24)
Glucose, Bld: 90 mg/dL (ref 70–99)
POTASSIUM: 3.7 mmol/L (ref 3.5–5.1)
Sodium: 138 mmol/L (ref 135–145)
Total Bilirubin: 1.3 mg/dL — ABNORMAL HIGH (ref 0.3–1.2)
Total Protein: 7.8 g/dL (ref 6.5–8.1)

## 2017-12-01 MED ORDER — KETOROLAC TROMETHAMINE 30 MG/ML IJ SOLN
30.0000 mg | INTRAMUSCULAR | Status: AC
  Administered 2017-12-01: 30 mg via INTRAVENOUS
  Filled 2017-12-01: qty 1

## 2017-12-01 MED ORDER — DEXTROSE-NACL 5-0.45 % IV SOLN
INTRAVENOUS | Status: DC
  Administered 2017-12-01: 23:00:00 via INTRAVENOUS

## 2017-12-01 MED ORDER — MORPHINE SULFATE (PF) 4 MG/ML IV SOLN
4.0000 mg | INTRAVENOUS | Status: DC | PRN
  Administered 2017-12-01: 4 mg via INTRAVENOUS
  Filled 2017-12-01: qty 1

## 2017-12-01 NOTE — ED Triage Notes (Signed)
Pt c/o  Sickle cell crisis right arm pain 3x hrs

## 2017-12-02 ENCOUNTER — Encounter: Payer: Self-pay | Admitting: Family Medicine

## 2017-12-02 ENCOUNTER — Ambulatory Visit (INDEPENDENT_AMBULATORY_CARE_PROVIDER_SITE_OTHER): Payer: Self-pay | Admitting: Family Medicine

## 2017-12-02 ENCOUNTER — Encounter (HOSPITAL_BASED_OUTPATIENT_CLINIC_OR_DEPARTMENT_OTHER): Payer: Self-pay | Admitting: Emergency Medicine

## 2017-12-02 VITALS — BP 124/72 | HR 59 | Temp 98.0°F | Resp 14 | Ht 72.0 in | Wt 175.0 lb

## 2017-12-02 DIAGNOSIS — D571 Sickle-cell disease without crisis: Secondary | ICD-10-CM

## 2017-12-02 DIAGNOSIS — D5700 Hb-SS disease with crisis, unspecified: Secondary | ICD-10-CM

## 2017-12-02 DIAGNOSIS — E559 Vitamin D deficiency, unspecified: Secondary | ICD-10-CM

## 2017-12-02 LAB — RETICULOCYTES
Immature Retic Fract: 23 % — ABNORMAL HIGH (ref 2.3–15.9)
RBC.: 4.98 MIL/uL (ref 4.22–5.81)
RETIC COUNT ABSOLUTE: 113.7 10*3/uL (ref 19.0–186.0)
RETIC CT PCT: 2.3 % (ref 0.4–3.1)

## 2017-12-02 MED ORDER — OXYCODONE HCL 20 MG PO TABS: 20.0000 mg | ORAL_TABLET | Freq: Four times a day (QID) | ORAL | 0 refills | Status: DC | PRN

## 2017-12-02 MED ORDER — HYDROXYUREA 500 MG PO CAPS: 1000.0000 mg | ORAL_CAPSULE | Freq: Every day | ORAL | 2 refills | Status: DC

## 2017-12-02 MED ORDER — OXYCODONE HCL 5 MG PO TABS
20.0000 mg | ORAL_TABLET | ORAL | Status: DC | PRN
  Administered 2017-12-02: 20 mg via ORAL
  Filled 2017-12-02: qty 4

## 2017-12-02 NOTE — Patient Instructions (Signed)
Hydroxyurea capsules What is this medicine? HYDROXYUREA (hye drox ee yoor EE a) is a chemotherapy drug. This medicine is used to treat certain types of leukemias and head and neck cancer. It is also used to control the painful crises of sickle cell anemia. This medicine may be used for other purposes; ask your health care provider or pharmacist if you have questions. COMMON BRAND NAME(S): Droxia, Hydrea What should I tell my health care provider before I take this medicine? They need to know if you have any of these conditions: -gout or high levels of uric acid in the blood -HIV or AIDS -kidney disease or on hemodialysis -leg wounds or ulcers -low blood counts, like low white cell, platelet, or red cell counts -prior or current interferon therapy -recent or ongoing radiation therapy -scheduled to receive a vaccine -an unusual or allergic reaction to hydroxyurea, other medicines, foods, dyes, or preservatives -pregnant or trying to get pregnant -breast-feeding How should I use this medicine? Take this medicine by mouth with a glass of water. Follow the directions on the prescription label. Take your medicine at regular intervals. Do not take it more often than directed. Do not stop taking except on your doctor's advice. People who are not taking this medicine should not be exposed to it. Wash your hands before and after handling your bottle or medicine. Caregivers should wear disposable gloves if they must touch the bottle or medicine. Clean up any medicine powder that spills with a damp disposable towel and throw the towel away in a closed container, such as a plastic bag. A special MedGuide will be given to you by the pharmacist with each prescription and refill of Droxyia. Be sure to read this information carefully each time. Talk to your pediatrician regarding the use of this medicine in children. Special care may be needed. Overdosage: If you think you have taken too much of this medicine  contact a poison control center or emergency room at once. NOTE: This medicine is only for you. Do not share this medicine with others. What if I miss a dose? If you miss a dose, take it as soon as you can. If it is almost time for your next dose, take only that dose. Do not take double or extra doses. What may interact with this medicine? This medicine may also interact with the following medications: -didanosine -stavudine -live virus vaccines This list may not describe all possible interactions. Give your health care provider a list of all the medicines, herbs, non-prescription drugs, or dietary supplements you use. Also tell them if you smoke, drink alcohol, or use illegal drugs. Some items may interact with your medicine. What should I watch for while using this medicine? This drug may make you feel generally unwell. This is not uncommon, as chemotherapy can affect healthy cells as well as cancer cells. Report any side effects. Continue your course of treatment even though you feel ill unless your doctor tells you to stop. You will receive regular blood tests during your treatment. Call your doctor or health care professional for advice if you get a fever, chills or sore throat, or other symptoms of a cold or flu. Do not treat yourself. This drug decreases your body's ability to fight infections. Try to avoid being around people who are sick. This medicine may increase your risk to bruise or bleed. Call your doctor or health care professional if you notice any unusual bleeding. Talk to your doctor about your risk of cancer. You may   be more at risk for certain types of cancers if you take this medicine. Keep out of the sun. If you cannot avoid being in the sun, wear protective clothing and use sunscreen. Do not use sun lamps or tanning beds/booths. Do not become pregnant while taking this medicine or for at least 6 months after stopping it. Women should inform their doctor if they wish to become  pregnant or think they might be pregnant. Men should not father a child while taking this medicine and for at least a year after stopping it. There is a potential for serious side effects to an unborn child. Talk to your health care professional or pharmacist for more information. Do not breast-feed an infant while taking this medicine. This may interfere with the ability to have or father a child. You should talk with your doctor or health care professional if you are concerned about your fertility. What side effects may I notice from receiving this medicine? Side effects that you should report to your doctor or health care professional as soon as possible: -allergic reactions like skin rash, itching or hives, swelling of the face, lips, or tongue -breathing problems -burning, redness or pain at the site of any radiation therapy -low blood counts - this medicine may decrease the number of white blood cells, red blood cells and platelets. You may be at increased risk for infections and bleeding. -signs of decreased platelets or bleeding - bruising, pinpoint red spots on the skin, black, tarry stools, blood in the urine -signs of decreased red blood cells - unusually weak or tired, fainting spells, lightheadedness -signs of infection - fever or chills, cough, sore throat, pain or difficulty passing urine -signs and symptoms of bleeding such as bloody or black, tarry stools; red or dark-brown urine; spitting up blood or brown material that looks like coffee grounds; red spots on the skin; unusual bruising or bleeding from the eye, gums, or nose -skin ulcers Side effects that usually do not require medical attention (report to your doctor or health care professional if they continue or are bothersome): -constipation -diarrhea -loss of appetite -mouth sores -nausea This list may not describe all possible side effects. Call your doctor for medical advice about side effects. You may report side effects  to FDA at 1-800-FDA-1088. Where should I keep my medicine? Keep out of the reach of children. See product for storage instructions. Each product may have different instructions. Keep tightly closed. Throw away any unused medicine after the expiration date. NOTE: This sheet is a summary. It may not cover all possible information. If you have questions about this medicine, talk to your doctor, pharmacist, or health care provider.  2018 Elsevier/Gold Standard (2016-02-12 11:43:13)  

## 2017-12-02 NOTE — Progress Notes (Signed)
[facility]  SICKLE CELL ANEMIA FOLLOW UP VISIT PROVIDER: Mike Gip, FNP    Subjective:   Paul Campos  is a 27 y.o.  male who  has a past medical history of Sickle cell anemia (HCC) and Sickle cell anemia (HCC). presents for a follow up for Sickle Cell Anemia. his last hospitalization/ED visit was 12/01/2017. he has had more than 6 hospitalizations or pain crises  in the past 12 months.  Pain regimen includes: Ibuprofen and oxycodone 20 mg  Hydrea Therapy: Yes Medication compliance: Yes  Pain today is 4/10 and is left arm Patient reports drinking 80 ounces of fluid per day.    Review of Systems  Constitutional: Negative.   HENT: Negative.   Eyes: Negative.   Respiratory: Negative.   Cardiovascular: Negative.   Gastrointestinal: Negative.   Genitourinary: Negative.   Musculoskeletal: Positive for joint pain and myalgias.  Skin: Negative.   Neurological: Negative.   Psychiatric/Behavioral: Negative.     Objective:   Objective  BP 124/72 (BP Location: Right Arm, Patient Position: Sitting, Cuff Size: Normal)   Pulse (!) 59   Temp 98 F (36.7 C) (Oral)   Resp 14   Ht 6' (1.829 m)   Wt 175 lb (79.4 kg)   SpO2 100%   BMI 23.73 kg/m    Physical Exam  Constitutional: He is oriented to person, place, and time. He appears well-developed and well-nourished. No distress.  HENT:  Head: Normocephalic and atraumatic.  Eyes: Pupils are equal, round, and reactive to light. Conjunctivae and EOM are normal.  Neck: Normal range of motion.  Cardiovascular: Normal rate, regular rhythm, normal heart sounds and intact distal pulses.  Pulmonary/Chest: Effort normal and breath sounds normal. No respiratory distress.  Abdominal: Soft. Bowel sounds are normal. He exhibits no distension.  Musculoskeletal: Normal range of motion.  Neurological: He is alert and oriented to person, place, and time.  Skin: Skin is warm and dry.    Psychiatric: He has a normal mood and affect. His behavior is normal. Judgment and thought content normal.  Nursing note and vitals reviewed.    Assessment/Plan:   Assessment   Encounter Diagnoses  Name Primary?  . Sickle cell pain crisis (HCC) Yes  . Hb-SS disease without crisis (HCC)      Plan   Hb-SS disease without crisis (HCC) Pending labs. Will adjust medications accordingly.   - Urinalysis, Routine w reflex microscopic - hydroxyurea (HYDREA) 500 MG capsule; Take 2 capsules (1,000 mg total) by mouth daily. May take with food to minimize GI side effects.  Dispense: 60 capsule; Refill: 2 - Oxycodone HCl 20 MG TABS; Take 1 tablet (20 mg total) by mouth every 6 (six) hours as needed for up to 15 days.  Dispense: 60 tablet; Refill: 0 - 161096 11+Oxyco+Alc+Crt-Bund - CBC With Differential - Comprehensive metabolic panel - Vitamin D, 25-hydroxy   Vitamin D deficiency Pending labs. Will adjust medications accordingly.   - Vitamin D, 25-hydroxy    Return to care as scheduled and prn. Patient verbalized understanding and agreed with plan of care.   1. Sickle cell disease - Continue Hydrea   We discussed the need for good hydration, monitoring of hydration status, avoidance of heat, cold, stress, and infection triggers. We discussed the risks and benefits of Hydrea, including bone marrow suppression, the possibility of GI upset, skin ulcers, hair thinning, and teratogenicity. The patient was reminded of the need to seek medical attention of any  symptoms of bleeding, anemia, or infection. Continue folic acid 1 mg daily to prevent aplastic bone marrow crises.   2. Pulmonary evaluation - Patient denies severe recurrent wheezes, shortness of breath with exercise, or persistent cough. If these symptoms develop, pulmonary function tests with spirometry will be ordered, and if abnormal, plan on referral to Pulmonology for further evaluation.  3. Cardiac - Routine screening for pulmonary  hypertension is not recommended.  4. Eye - High risk of proliferative retinopathy. Annual eye exam with retinal exam recommended to patient.  5. Immunization status -  Yearly influenza vaccination is recommended, as well as being up to date with Meningococcal and Pneumococcal vaccines.   6. Acute and chronic painful episodes - We discussed that pt is to receive Schedule II prescriptions only from [place]. Pt is also aware that the prescription history is available to [place] online through the [facility] CSRS. Controlled substance agreement signed (Date 12/02/2017 ). We reminded Paul Campos that all patients receiving Schedule II narcotics must be seen for follow within one month of prescription being requested. We reviewed the terms of our pain agreement, including the need to keep medicines in a safe locked location away from children or pets, and the need to report excess sedation or constipation, measures to avoid constipation, and policies related to early refills and stolen prescriptions. According to the [place] [facility] program, we have reviewed details related to analgesia, adverse effects, aberrant behaviors.  7. Iron overload from chronic transfusion.  Not applicable at this time.  If this occurs will use Exjade for management.   8. Vitamin D deficiency - Drisdol 50,000 units weekly. Patient encouraged to take as prescribed.   The above recommendations are taken from the NIH Evidence-Based Management of Sickle Cell Disease: Expert Panel Report, 09811.   Ms. Andr L. Riley Lam, FNP-BC [facility] [address] [place], [place] [address] [phone]  This note has been created with Dragon speech recognition software and smart phrase technology. Any transcriptional errors are unintentional.

## 2017-12-02 NOTE — ED Provider Notes (Signed)
[facility] EMERGENCY DEPARTMENT Provider Note   CSN: [id number] Arrival date & time: 12/01/17  2146     History   Chief Complaint Chief Complaint  Patient presents with  . Sickle Cell Pain Crisis    HPI Paul Campos is a 27 y.o. male.  The history is provided by the patient.  Sickle Cell Pain Crisis  Location:  Lower extremity and back Severity:  Severe Onset quality:  Gradual Similar to previous crisis episodes: yes   Timing:  Constant Progression:  Unchanged Chronicity:  Recurrent Sickle cell genotype:  SS History of pulmonary emboli: no   Context: cold exposure and low humidity   Relieved by:  Nothing Worsened by:  Nothing Ineffective treatments:  None tried Associated symptoms: no chest pain, no congestion, no cough, no fatigue, no fever, no headaches, no leg ulcers, no nausea, no priapism, no shortness of breath, no sore throat, no swelling of legs, no vision change, no vomiting and no wheezing   Risk factors: no hx of stroke   Patient states he usually gets morphine and toradol as well as IVF.     Past Medical History:  Diagnosis Date  . Sickle cell anemia (HCC)   . Sickle cell anemia [facility])     Patient Active Problem List   Diagnosis Date Noted  . Hypokalemia 08/21/2012  . Thrombocytopenia, unspecified (HCC) 08/19/2012  . CAP (community acquired pneumonia) 08/18/2012  . Sickle cell pain crisis (HCC) 05/06/2012  . Chest pain 05/06/2012    Past Surgical History:  Procedure Laterality Date  . APPENDECTOMY          Home Medications    Prior to Admission medications   Medication Sig Start Date End Date Taking? Authorizing Provider  oxycodone (OXY-IR) 5 MG capsule Take 20 mg by mouth every 4 (four) hours as needed.   Yes [provider]  folic acid (FOLVITE) 1 MG tablet Take 1 tablet (1 mg total) by mouth daily. 10/26/17   Mike Gip, FNP  hydroxyurea (HYDREA) 500 MG capsule Take 2 capsules (1,000 mg total) by mouth daily. May  take with food to minimize GI side effects. 10/26/17 01/24/18  Mike Gip, FNP  ibuprofen (ADVIL,MOTRIN) 800 MG tablet Take 1 tablet (800 mg total) by mouth every 8 (eight) hours as needed. 11/11/17   Mike Gip, FNP  Vitamin D, Ergocalciferol, (DRISDOL) 50000 units CAPS capsule Take 1 capsule (50,000 Units total) by mouth every 7 (seven) days. 10/26/17 12/21/17  Mike Gip, FNP  Vitamin D, Ergocalciferol, (DRISDOL) 50000 units CAPS capsule Take 1 capsule (50,000 Units total) by mouth every 7 (seven) days. 11/09/17 01/04/18  Mike Gip, FNP    Family History Family History  Problem Relation Age of Onset  . Lung cancer Mother     Social History Social History   Tobacco Use  . Smoking status: Never Smoker  . Smokeless tobacco: Never Used  Substance Use Topics  . Alcohol use: No  . Drug use: No     Allergies   Dilaudid [hydromorphone hcl]; Hydromorphone; and Cefotaxime   Review of Systems Review of Systems  Constitutional: Negative for fatigue and fever.  HENT: Negative for congestion and sore throat.   Respiratory: Negative for cough, shortness of breath and wheezing.   Cardiovascular: Negative for chest pain.  Gastrointestinal: Negative for nausea and vomiting.  Musculoskeletal: Positive for arthralgias.  Neurological: Negative for headaches.  All other systems reviewed and are negative.    Physical Exam Updated Vital Signs BP 111/76 (BP  Location: Left Arm)   Pulse (!) 58   Temp 98.8 F (37.1 C) (Oral)   Resp 15   Ht 6' (1.829 m)   Wt 81.6 kg   SpO2 100%   BMI 24.41 kg/m   Physical Exam  Constitutional: He is oriented to person, place, and time. He appears well-developed and well-nourished. No distress.  HENT:  Head: Normocephalic and atraumatic.  Mouth/Throat: No oropharyngeal exudate.  Eyes: Pupils are equal, round, and reactive to light. Conjunctivae are normal.  Neck: Normal range of motion. Neck supple.  Cardiovascular: Normal rate, regular  rhythm, normal heart sounds and intact distal pulses.  Pulmonary/Chest: Effort normal and breath sounds normal. No stridor. He has no wheezes. He has no rales.  Abdominal: Soft. Bowel sounds are normal. He exhibits no mass. There is no tenderness. There is no rebound and no guarding.  Musculoskeletal: Normal range of motion.  Neurological: He is alert and oriented to person, place, and time. He displays normal reflexes.  Skin: Skin is warm and dry. Capillary refill takes less than 2 seconds.  Psychiatric: He has a normal mood and affect.  Nursing note and vitals reviewed.    ED Treatments / Results  Labs (all labs ordered are listed, but only abnormal results are displayed) Results for orders placed or performed during the hospital encounter of 12/01/17  CBC WITH DIFFERENTIAL  Result Value Ref Range   WBC 6.2 4.0 - 10.5 K/uL   RBC 4.98 4.22 - 5.81 MIL/uL   Hemoglobin 12.2 (L) 13.0 - 17.0 g/dL   HCT 40.9 (L) 81.1 - 91.4 %   MCV 73.1 (L) 80.0 - 100.0 fL   MCH 24.5 (L) 26.0 - 34.0 pg   MCHC 33.5 30.0 - 36.0 g/dL   RDW 78.2 95.6 - 21.3 %   Platelets 120 (L) 150 - 400 K/uL   nRBC 0.0 0.0 - 0.2 %   Neutrophils Relative % 43 %   Neutro Abs 2.7 1.7 - 7.7 K/uL   Lymphocytes Relative 41 %   Lymphs Abs 2.5 0.7 - 4.0 K/uL   Monocytes Relative 11 %   Monocytes Absolute 0.7 0.1 - 1.0 K/uL   Eosinophils Relative 5 %   Eosinophils Absolute 0.3 0.0 - 0.5 K/uL   Basophils Relative 0 %   Basophils Absolute 0.0 0.0 - 0.1 K/uL   Immature Granulocytes 0 %   Abs Immature Granulocytes 0.02 0.00 - 0.07 K/uL  Comprehensive metabolic panel  Result Value Ref Range   Sodium 138 135 - 145 mmol/L   Potassium 3.7 3.5 - 5.1 mmol/L   Chloride 104 98 - 111 mmol/L   CO2 26 22 - 32 mmol/L   Glucose, Bld 90 70 - 99 mg/dL   BUN 8 6 - 20 mg/dL   Creatinine, Ser 0.86 0.61 - 1.24 mg/dL   Calcium 9.2 8.9 - 57.8 mg/dL   Total Protein 7.8 6.5 - 8.1 g/dL   Albumin 4.5 3.5 - 5.0 g/dL   AST 28 15 - 41 U/L   ALT  20 0 - 44 U/L   Alkaline Phosphatase 57 38 - 126 U/L   Total Bilirubin 1.3 (H) 0.3 - 1.2 mg/dL   GFR calc non Af Amer >60 >60 mL/min   GFR calc Af Amer >60 >60 mL/min   Anion gap 8 5 - 15   No results found.  EKG None  Radiology No results found.  Medications ordered immediately following seeing patient.  Patient declined last dose of morphine stating  he was much improved and would prefer his home dose of Oxy IR and this was immediately ordered for the patient   Medications Ordered in ED Medications  ketorolac (TORADOL) 30 MG/ML injection 30 mg (30 mg Intravenous Given 12/01/17 2323)  Patient also received Morphine IV D5NS at 100 cc/hour  as well as 20 mg Oxy IR prior to discharge.(check MAR for admin times)      Final Clinical Impressions(s) / ED Diagnoses   Final diagnoses:  Sickle cell pain crisis (HCC)    Return for weakness, numbness, changes in vision or speech, fevers >100.4 unrelieved by medication, shortness of breath, intractable vomiting, or diarrhea, abdominal pain, Inability to tolerate liquids or food, cough, altered mental status or any concerns. No signs of systemic illness or infection. The patient is nontoxic-appearing on exam and vital signs are within normal limits.    I have reviewed the triage vital signs and the nursing notes. Pertinent labs &imaging results that were available during my care of the patient were reviewed by me and considered in my medical decision making (see chart for details).  After history, exam, and medical workup I feel the patient has been appropriately medically screened and is safe for discharge home. Pertinent diagnoses were discussed with the patient. Patient was given return precautions.   Emmitt Matthews, MD 12/02/17 1610

## 2017-12-02 NOTE — ED Notes (Signed)
Pt verbalizes understanding of d/c instructions and denies any further needs at this time. 

## 2017-12-03 LAB — COMPREHENSIVE METABOLIC PANEL
ALT: 16 IU/L (ref 0–44)
AST: 20 IU/L (ref 0–40)
Albumin/Globulin Ratio: 1.6 (ref 1.2–2.2)
Albumin: 4.5 g/dL (ref 3.5–5.5)
Alkaline Phosphatase: 64 IU/L (ref 39–117)
BUN/Creatinine Ratio: 8 — ABNORMAL LOW (ref 9–20)
BUN: 7 mg/dL (ref 6–20)
Bilirubin Total: 1 mg/dL (ref 0.0–1.2)
CO2: 21 mmol/L (ref 20–29)
Calcium: 9.4 mg/dL (ref 8.7–10.2)
Chloride: 102 mmol/L (ref 96–106)
Creatinine, Ser: 0.9 mg/dL (ref 0.76–1.27)
GFR calc Af Amer: 137 mL/min/{1.73_m2} (ref >59)
GFR calc non Af Amer: 118 mL/min/{1.73_m2} (ref >59)
Globulin, Total: 2.9 g/dL (ref 1.5–4.5)
Glucose: 81 mg/dL (ref 65–99)
Potassium: 3.8 mmol/L (ref 3.5–5.2)
Sodium: 138 mmol/L (ref 134–144)
Total Protein: 7.4 g/dL (ref 6.0–8.5)

## 2017-12-03 LAB — CBC WITH DIFFERENTIAL
Basophils Absolute: 0 10*3/uL (ref 0.0–0.2)
Basos: 0 %
EOS (ABSOLUTE): 0.3 10*3/uL (ref 0.0–0.4)
Eos: 5 %
Hematocrit: 36.2 % — ABNORMAL LOW (ref 37.5–51.0)
Hemoglobin: 12.2 g/dL — ABNORMAL LOW (ref 13.0–17.7)
Immature Grans (Abs): 0 10*3/uL (ref 0.0–0.1)
Immature Granulocytes: 0 %
Lymphocytes Absolute: 2.3 10*3/uL (ref 0.7–3.1)
Lymphs: 42 %
MCH: 24.9 pg — ABNORMAL LOW (ref 26.6–33.0)
MCHC: 33.7 g/dL (ref 31.5–35.7)
MCV: 74 fL — ABNORMAL LOW (ref 79–97)
Monocytes Absolute: 0.8 10*3/uL (ref 0.1–0.9)
Monocytes: 14 %
Neutrophils Absolute: 2.2 10*3/uL (ref 1.4–7.0)
Neutrophils: 39 %
RBC: 4.89 x10E6/uL (ref 4.14–5.80)
RDW: 15 % (ref 12.3–15.4)
WBC: 5.6 10*3/uL (ref 3.4–10.8)

## 2017-12-03 LAB — URINALYSIS, ROUTINE W REFLEX MICROSCOPIC
Bilirubin, UA: NEGATIVE
Glucose, UA: NEGATIVE
Ketones, UA: NEGATIVE
Nitrite, UA: NEGATIVE
Protein, UA: NEGATIVE
RBC, UA: NEGATIVE
Specific Gravity, UA: 1.018 (ref 1.005–1.030)
Urobilinogen, Ur: 0.2 mg/dL (ref 0.2–1.0)
pH, UA: 5.5 (ref 5.0–7.5)

## 2017-12-03 LAB — MICROSCOPIC EXAMINATION: Casts: NONE SEEN /lpf

## 2017-12-03 LAB — VITAMIN D 25 HYDROXY (VIT D DEFICIENCY, FRACTURES): Vit D, 25-Hydroxy: 20.7 ng/mL — ABNORMAL LOW (ref 30.0–100.0)

## 2017-12-08 LAB — DRUG PROFILE 799016
Amphetamine: NEGATIVE
Amphetamines: POSITIVE — AB
Methamphetamine: POSITIVE — AB

## 2017-12-08 LAB — OPIATES CONFIRMATION, URINE
Codeine: NEGATIVE
Hydrocodone: NEGATIVE
Hydromorphone: NEGATIVE
Morphine Confirm: 2415 ng/mL
Morphine: POSITIVE — AB
Opiates: POSITIVE ng/mL — AB

## 2017-12-08 LAB — DRUG SCREEN 764883 11+OXYCO+ALC+CRT-BUND
BENZODIAZ UR QL: NEGATIVE ng/mL
Barbiturate: NEGATIVE ng/mL
Cocaine (Metabolite): NEGATIVE ng/mL
Creatinine: 275.6 mg/dL (ref 20.0–300.0)
Ethanol: NEGATIVE %
Meperidine: NEGATIVE ng/mL
Methadone Screen, Urine: NEGATIVE ng/mL
Phencyclidine: NEGATIVE ng/mL
Propoxyphene: NEGATIVE ng/mL
Tramadol: NEGATIVE ng/mL
pH, Urine: 5.9 (ref 4.5–8.9)

## 2017-12-08 LAB — D/L METHAMPHETAMINE
D Methamphetamine: 100 %
L Methamphetamine: 0 %
Methamphetamine Quant, Ur: 1140 ng/mL

## 2017-12-08 LAB — OXYCODONE/OXYMORPHONE, CONFIRM
OXYCODONE/OXYMORPH: POSITIVE — AB
OXYCODONE: 2558 ng/mL
OXYCODONE: POSITIVE — AB
OXYMORPHONE (GC/MS): 2899 ng/mL
OXYMORPHONE: POSITIVE — AB

## 2017-12-08 LAB — CANNABINOID CONFIRMATION, UR
CANNABINOIDS: POSITIVE — AB
Carboxy THC GC/MS Conf: 95 ng/mL

## 2017-12-26 ENCOUNTER — Other Ambulatory Visit: Payer: Self-pay

## 2017-12-26 DIAGNOSIS — D571 Sickle-cell disease without crisis: Secondary | ICD-10-CM

## 2017-12-26 MED ORDER — FOLIC ACID 1 MG PO TABS: 1.0000 mg | ORAL_TABLET | Freq: Every day | ORAL | 11 refills | Status: DC

## 2017-12-26 MED ORDER — IBUPROFEN 800 MG PO TABS: 800.0000 mg | ORAL_TABLET | Freq: Three times a day (TID) | ORAL | 1 refills | Status: DC | PRN

## 2017-12-26 MED ORDER — HYDROXYUREA 500 MG PO CAPS: 1000.0000 mg | ORAL_CAPSULE | Freq: Every day | ORAL | 2 refills | Status: DC

## 2017-12-26 MED ORDER — VITAMIN D (ERGOCALCIFEROL) 1.25 MG (50000 UNIT) PO CAPS: 50000.0000 [IU] | ORAL_CAPSULE | ORAL | 5 refills | Status: AC

## 2017-12-27 ENCOUNTER — Telehealth: Payer: Self-pay

## 2017-12-27 NOTE — Telephone Encounter (Signed)
Refill request for oxycodone. Please advise. This has fallen off of current medication. Thanks!

## 2017-12-29 ENCOUNTER — Other Ambulatory Visit: Payer: Self-pay | Admitting: Family Medicine

## 2017-12-29 ENCOUNTER — Telehealth: Payer: Self-pay

## 2017-12-29 DIAGNOSIS — D571 Sickle-cell disease without crisis: Secondary | ICD-10-CM

## 2017-12-29 MED ORDER — OXYCODONE HCL 20 MG PO TABS: 20.0000 mg | ORAL_TABLET | Freq: Four times a day (QID) | ORAL | 0 refills | Status: DC | PRN

## 2017-12-29 NOTE — Progress Notes (Unsigned)
Rx for Oxy-IR sent to pharmacy today.  

## 2017-12-29 NOTE — Telephone Encounter (Signed)
Left a vm of appointment time on 01/02/2018 at 840am.

## 2017-12-30 ENCOUNTER — Other Ambulatory Visit: Payer: Self-pay | Admitting: Internal Medicine

## 2018-01-02 ENCOUNTER — Ambulatory Visit (INDEPENDENT_AMBULATORY_CARE_PROVIDER_SITE_OTHER): Payer: Self-pay | Admitting: Family Medicine

## 2018-01-02 ENCOUNTER — Encounter: Payer: Self-pay | Admitting: Family Medicine

## 2018-01-02 VITALS — BP 120/78 | HR 64 | Temp 98.6°F | Resp 14 | Ht 72.0 in | Wt 183.0 lb

## 2018-01-02 DIAGNOSIS — D571 Sickle-cell disease without crisis: Secondary | ICD-10-CM

## 2018-01-02 LAB — POCT URINALYSIS DIPSTICK
Bilirubin, UA: NEGATIVE
Blood, UA: NEGATIVE
Glucose, UA: NEGATIVE
Ketones, UA: NEGATIVE
Nitrite, UA: NEGATIVE
Protein, UA: NEGATIVE
Spec Grav, UA: 1.02 (ref 1.010–1.025)
Urobilinogen, UA: 0.2 E.U./dL
pH, UA: 6 (ref 5.0–8.0)

## 2018-01-02 NOTE — Progress Notes (Signed)
[facility] AND SICKLE CELL CARE  SICKLE CELL ANEMIA FOLLOW UP VISIT PROVIDER: Mike Gip, FNP    Subjective:   Paul Campos  is a 27 y.o.  male who  has a past medical history of Sickle cell anemia (HCC) and Sickle cell anemia (HCC). presents for a follow up for Sickle Cell Anemia.  Pain regimen includes: Ibuprofen and oxycodone 20mg  Q6HPRN Hydrea Therapy: Yes Medication compliance: Yes Patient denies use of methamphetamnes. He does admit to smoking marijuanna and thinks that it was laced.  Pain today is 2/10 back pain.   Patient reports adequate hydration.    Review of Systems  Constitutional: Negative.   HENT: Negative.   Eyes: Negative.   Respiratory: Negative.   Cardiovascular: Negative.   Gastrointestinal: Negative.   Genitourinary: Negative.   Musculoskeletal: Positive for back pain (low back).  Skin: Negative.   Neurological: Negative.   Psychiatric/Behavioral: Negative.     Objective:   Objective  BP 120/78 (BP Location: Left Arm, Patient Position: Sitting, Cuff Size: Normal)   Pulse 64   Temp 98.6 F (37 C) (Oral)   Resp 14   Ht 6' (1.829 m)   Wt 183 lb (83 kg)   SpO2 100%   BMI 24.82 kg/m    Physical Exam  Constitutional: He is oriented to person, place, and time. He appears well-developed and well-nourished. No distress.  HENT:  Head: Normocephalic and atraumatic.  Eyes: Pupils are equal, round, and reactive to light. Conjunctivae and EOM are normal.  Neck: Normal range of motion.  Cardiovascular: Normal rate, regular rhythm, normal heart sounds and intact distal pulses.  Pulmonary/Chest: Effort normal and breath sounds normal. No respiratory distress.  Musculoskeletal: Normal range of motion.  Neurological: He is alert and oriented to person, place, and time.  Skin: Skin is warm and dry.  Psychiatric: He has a normal mood and affect. His behavior is normal. Judgment and thought content normal.  Nursing note and  vitals reviewed.    Assessment/Plan:   Assessment   Encounter Diagnosis  Name Primary?  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 Hb-SS disease without crisis [facility]) Patient denies meth use. Will do UDS today. If negative, will prescribe oxycodone 20 mg Q6 H PRN for acute pain during crisis. Patient states that he has  - Urinalysis Dipstick - Drug Profile, Ur, 9 Drugs   Return to care as scheduled and prn. Patient verbalized understanding and agreed with plan of care.   1. Sickle cell disease - Continue Hydrea   We discussed the need for good hydration, monitoring of hydration status, avoidance of heat, cold, stress, and infection triggers. We discussed the risks and benefits of Hydrea, including bone marrow suppression, the possibility of GI upset, skin ulcers, hair thinning, and teratogenicity. The patient was reminded of the need to seek medical attention of any symptoms of bleeding, anemia, or infection. Continue folic acid 1 mg daily to prevent aplastic bone marrow crises.   2. Pulmonary evaluation - Patient denies severe recurrent wheezes, shortness of breath with exercise, or persistent cough. If these symptoms develop, pulmonary function tests with spirometry will be ordered, and if abnormal, plan on referral to Pulmonology for further evaluation.  3. Cardiac - Routine screening for pulmonary hypertension is not recommended.  4. Eye - High risk of proliferative retinopathy. Annual eye exam with retinal exam recommended to patient.  5. Immunization status -  Yearly influenza vaccination is recommended, as  well as being up to date with Meningococcal and Pneumococcal vaccines.   6. Acute and chronic painful episodes - We discussed that pt is to receive Schedule II prescriptions only from [place]. Pt is also aware that the prescription history is available to [place] online through the [facility] CSRS. Controlled substance agreement signed. We reminded Duvid Smalls that all patients  receiving Schedule II narcotics must be seen for follow within one month of prescription being requested. We reviewed the terms of our pain agreement, including the need to keep medicines in a safe locked location away from children or pets, and the need to report excess sedation or constipation, measures to avoid constipation, and policies related to early refills and stolen prescriptions. According to the [place] Chronic Pain Initiative program, we have reviewed details related to analgesia, adverse effects, aberrant behaviors.  7. Iron overload from chronic transfusion.  Not applicable at this time.  If this occurs will use Exjade for management.   8. Vitamin D deficiency - Drisdol 50,000 units weekly. Patient encouraged to take as prescribed.   The above recommendations are taken from the NIH Evidence-Based Management of Sickle Cell Disease: Expert Panel Report, 16109.   Ms. Andr L. Riley Lam, FNP-BC [facility] [address] [place], [place] [address] [phone]  This note has been created with Dragon speech recognition software and smart phrase technology. Any transcriptional errors are unintentional.

## 2018-01-02 NOTE — Patient Instructions (Signed)
Sickle Cell Anemia, Adult °Sickle cell anemia is a condition where your red blood cells are shaped like sickles. Red blood cells carry oxygen through the body. Sickle-shaped red blood cells do not live as long as normal red blood cells. They also clump together and block blood from flowing through the blood vessels. These things prevent the body from getting enough oxygen. Sickle cell anemia causes organ damage and pain. It also increases the risk of infection. °Follow these instructions at home: °· Drink enough fluid to keep your pee (urine) clear or pale yellow. Drink more in hot weather and during exercise. °· Do not smoke. Smoking lowers oxygen levels in the blood. °· Only take over-the-counter or prescription medicines as told by your doctor. °· Take antibiotic medicines as told by your doctor. Make sure you finish them even if you start to feel better. °· Take supplements as told by your doctor. °· Consider wearing a medical alert bracelet. This tells anyone caring for you in an emergency of your condition. °· When traveling, keep your medical information, doctors' names, and the medicines you take with you at all times. °· If you have a fever, do not take fever medicines right away. This could cover up a problem. Tell your doctor. °· Keep all follow-up visits with your doctor. Sickle cell anemia requires regular medical care. °Contact a doctor if: °You have a fever. °Get help right away if: °· You feel dizzy or faint. °· You have new belly (abdominal) pain, especially on the left side near the stomach area. °· You have a lasting, often uncomfortable and painful erection of the penis (priapism). If it is not treated right away, you will become unable to have sex (impotence). °· You have numbness in your arms or legs or you have a hard time moving them. °· You have a hard time talking. °· You have a fever or lasting symptoms for more than 2-3 days. °· You have a fever and your symptoms suddenly get  worse. °· You have signs or symptoms of infection. These include: °? Chills. °? Being more tired than normal (lethargy). °? Irritability. °? Poor eating. °? Throwing up (vomiting). °· You have pain that is not helped with medicine. °· You have shortness of breath. °· You have pain in your chest. °· You are coughing up pus-like or bloody mucus. °· You have a stiff neck. °· Your feet or hands swell or have pain. °· Your belly looks bloated. °· Your joints hurt. °This information is not intended to replace advice given to you by your health care provider. Make sure you discuss any questions you have with your health care provider. °Document Released: 11/29/2012 Document Revised: 07/17/2015 Document Reviewed: 09/20/2012 °Elsevier Interactive Patient Education © 2017 Elsevier Inc. ° °

## 2018-01-05 LAB — DRUG PROFILE, UR, 9 DRUGS (LABCORP)
Amphetamines, Urine: NEGATIVE ng/mL
Barbiturate Quant, Ur: NEGATIVE ng/mL
Benzodiazepine Quant, Ur: NEGATIVE ng/mL
Cannabinoid Quant, Ur: POSITIVE — AB
Cocaine (Metab.): NEGATIVE ng/mL
Methadone Screen, Urine: NEGATIVE ng/mL
Opiate Quant, Ur: NEGATIVE
PCP Quant, Ur: NEGATIVE ng/mL
Propoxyphene: NEGATIVE ng/mL

## 2018-01-26 DIAGNOSIS — D571 Sickle-cell disease without crisis: Secondary | ICD-10-CM

## 2018-01-26 MED ORDER — OXYCODONE HCL 20 MG PO TABS: 20.0000 mg | ORAL_TABLET | Freq: Four times a day (QID) | ORAL | 0 refills | Status: DC | PRN

## 2018-01-30 ENCOUNTER — Ambulatory Visit (INDEPENDENT_AMBULATORY_CARE_PROVIDER_SITE_OTHER): Payer: Self-pay | Admitting: Family Medicine

## 2018-01-30 VITALS — BP 124/77 | HR 72 | Temp 98.0°F | Resp 16 | Ht 72.0 in | Wt 183.0 lb

## 2018-01-30 DIAGNOSIS — D571 Sickle-cell disease without crisis: Secondary | ICD-10-CM

## 2018-01-30 LAB — POCT URINALYSIS DIPSTICK
Bilirubin, UA: NEGATIVE
Blood, UA: NEGATIVE
Glucose, UA: NEGATIVE
Ketones, UA: NEGATIVE
Nitrite, UA: NEGATIVE
Protein, UA: POSITIVE — AB
Spec Grav, UA: 1.02 (ref 1.010–1.025)
Urobilinogen, UA: 1 E.U./dL
pH, UA: 6 (ref 5.0–8.0)

## 2018-01-30 NOTE — Progress Notes (Signed)
[facility] MEDICINE AND SICKLE CELL CARE  SICKLE CELL ANEMIA FOLLOW UP VISIT PROVIDER: Mike GipAndre Cing , FNP    Subjective:   Paul Campos  is a 27 y.o.  male who  has a past medical history of Sickle cell anemia (HCC) and Sickle cell anemia (HCC). presents for a follow up for Sickle Cell Anemia. The patient has had 1 admissions in the past 6 months.  Pain regimen includes: Ibuprofen and oxycodone Hydrea Therapy: Yes Medication compliance: Yes Patient denies pain today. The patient reports adequate daily hydration.     Review of Systems  Constitutional: Negative.   HENT: Negative.   Eyes: Negative.   Respiratory: Negative.   Cardiovascular: Negative.   Gastrointestinal: Negative.   Genitourinary: Negative.   Musculoskeletal: Negative.   Skin: Negative.   Neurological: Negative.   Psychiatric/Behavioral: Negative.     Objective:   Objective  BP 124/77 (BP Location: Left Arm, Patient Position: Sitting, Cuff Size: Normal)   Pulse 72   Temp 98 F (36.7 C) (Oral)   Resp 16   Ht 6' (1.829 m)   Wt 183 lb (83 kg)   SpO2 100%   BMI 24.82 kg/m   Wt Readings from Last 3 Encounters:  01/30/18 183 lb (83 kg)  01/02/18 183 lb (83 kg)  12/02/17 175 lb (79.4 kg)     Physical Exam  Constitutional: He is oriented to person, place, and time. He appears well-developed and well-nourished. No distress.  HENT:  Head: Normocephalic and atraumatic.  Eyes: Pupils are equal, round, and reactive to light. Conjunctivae and EOM are normal.  Neck: Normal range of motion.  Cardiovascular: Normal rate, regular rhythm, normal heart sounds and intact distal pulses.  Pulmonary/Chest: Effort normal and breath sounds normal. No respiratory distress.  Abdominal: Soft. Bowel sounds are normal. He exhibits no distension.  Musculoskeletal: Normal range of motion.  Neurological: He is alert and oriented to person, place, and time.  Skin: Skin is warm and dry.  Psychiatric: He has  a normal mood and affect. His behavior is normal. Thought content normal.  Nursing note and vitals reviewed.    Assessment/Plan:   Assessment   Encounter Diagnosis  Name Primary?  . Sickle cell pain crisis (HCC) Yes     Plan  1. Hb-SS disease without crisis (HCC) No medication changes warranted today. Labs pending.  - Urinalysis Dipstick - [id number]+Oxyco+Alc+Crt-Bund - CBC with Differential - Comprehensive metabolic panel    Return to care as scheduled and prn. Patient verbalized understanding and agreed with plan of care.   1. Sickle cell disease - Continue Hydrea   We discussed the need for good hydration, monitoring of hydration status, avoidance of heat, cold, stress, and infection triggers. We discussed the risks and benefits of Hydrea, including bone marrow suppression, the possibility of GI upset, skin ulcers, hair thinning, and teratogenicity. The patient was reminded of the need to seek medical attention of any symptoms of bleeding, anemia, or infection. Continue folic acid 1 mg daily to prevent aplastic bone marrow crises.   2. Pulmonary evaluation - Patient denies severe recurrent wheezes, shortness of breath with exercise, or persistent cough. If these symptoms develop, pulmonary function tests with spirometry will be ordered, and if abnormal, plan on referral to Pulmonology for further evaluation.  3. Cardiac - Routine screening for pulmonary hypertension is not recommended.  4. Eye - High risk of proliferative retinopathy. Annual eye exam with retinal exam recommended to patient.  5. Immunization status -  Yearly influenza vaccination is recommended, as well as being up to date with Meningococcal and Pneumococcal vaccines.   6. Acute and chronic painful episodes - We discussed that pt is to receive Schedule II prescriptions only from [place]. Pt is also aware that the prescription history is available to [place] online through the [facility] CSRS. Controlled substance agreement  signed. We reminded Paul Campos that all patients receiving Schedule II narcotics must be seen for follow within one month of prescription being requested. We reviewed the terms of our pain agreement, including the need to keep medicines in a safe locked location away from children or pets, and the need to report excess sedation or constipation, measures to avoid constipation, and policies related to early refills and stolen prescriptions. According to the [place] Chronic Pain Initiative program, we have reviewed details related to analgesia, adverse effects, aberrant behaviors.  7. Iron overload from chronic transfusion.  Not applicable at this time.  If this occurs will use Exjade for management.   8. Vitamin D deficiency - Drisdol 50,000 units weekly. Patient encouraged to take as prescribed.   The above recommendations are taken from the NIH Evidence-Based Management of Sickle Cell Disease: Expert Panel Report, 40981.   Ms. Paul L. Riley Lam, FNP-BC [facility] [address] [place], [place] [address] [phone]  This note has been created with Dragon speech recognition software and smart phrase technology. Any transcriptional errors are unintentional.

## 2018-01-30 NOTE — Patient Instructions (Signed)
Sickle Cell Anemia, Adult °Sickle cell anemia is a condition where your red blood cells are shaped like sickles. Red blood cells carry oxygen through the body. Sickle-shaped red blood cells do not live as long as normal red blood cells. They also clump together and block blood from flowing through the blood vessels. These things prevent the body from getting enough oxygen. Sickle cell anemia causes organ damage and pain. It also increases the risk of infection. °Follow these instructions at home: °· Drink enough fluid to keep your pee (urine) clear or pale yellow. Drink more in hot weather and during exercise. °· Do not smoke. Smoking lowers oxygen levels in the blood. °· Only take over-the-counter or prescription medicines as told by your doctor. °· Take antibiotic medicines as told by your doctor. Make sure you finish them even if you start to feel better. °· Take supplements as told by your doctor. °· Consider wearing a medical alert bracelet. This tells anyone caring for you in an emergency of your condition. °· When traveling, keep your medical information, doctors' names, and the medicines you take with you at all times. °· If you have a fever, do not take fever medicines right away. This could cover up a problem. Tell your doctor. °· Keep all follow-up visits with your doctor. Sickle cell anemia requires regular medical care. °Contact a doctor if: °You have a fever. °Get help right away if: °· You feel dizzy or faint. °· You have new belly (abdominal) pain, especially on the left side near the stomach area. °· You have a lasting, often uncomfortable and painful erection of the penis (priapism). If it is not treated right away, you will become unable to have sex (impotence). °· You have numbness in your arms or legs or you have a hard time moving them. °· You have a hard time talking. °· You have a fever or lasting symptoms for more than 2-3 days. °· You have a fever and your symptoms suddenly get  worse. °· You have signs or symptoms of infection. These include: °? Chills. °? Being more tired than normal (lethargy). °? Irritability. °? Poor eating. °? Throwing up (vomiting). °· You have pain that is not helped with medicine. °· You have shortness of breath. °· You have pain in your chest. °· You are coughing up pus-like or bloody mucus. °· You have a stiff neck. °· Your feet or hands swell or have pain. °· Your belly looks bloated. °· Your joints hurt. °This information is not intended to replace advice given to you by your health care provider. Make sure you discuss any questions you have with your health care provider. °Document Released: 11/29/2012 Document Revised: 07/17/2015 Document Reviewed: 09/20/2012 °Elsevier Interactive Patient Education © 2017 Elsevier Inc. ° °

## 2018-01-31 LAB — COMPREHENSIVE METABOLIC PANEL
ALT: 13 IU/L (ref 0–44)
AST: 19 IU/L (ref 0–40)
Albumin/Globulin Ratio: 1.8 (ref 1.2–2.2)
Albumin: 4.5 g/dL (ref 3.5–5.5)
Alkaline Phosphatase: 59 IU/L (ref 39–117)
BUN/Creatinine Ratio: 7 — ABNORMAL LOW (ref 9–20)
BUN: 6 mg/dL (ref 6–20)
Bilirubin Total: 1.1 mg/dL (ref 0.0–1.2)
CO2: 22 mmol/L (ref 20–29)
Calcium: 9.3 mg/dL (ref 8.7–10.2)
Chloride: 104 mmol/L (ref 96–106)
Creatinine, Ser: 0.83 mg/dL (ref 0.76–1.27)
GFR calc Af Amer: 141 mL/min/{1.73_m2} (ref >59)
GFR calc non Af Amer: 122 mL/min/{1.73_m2} (ref >59)
Globulin, Total: 2.5 g/dL (ref 1.5–4.5)
Glucose: 85 mg/dL (ref 65–99)
Potassium: 4.1 mmol/L (ref 3.5–5.2)
Sodium: 141 mmol/L (ref 134–144)
Total Protein: 7 g/dL (ref 6.0–8.5)

## 2018-01-31 LAB — CBC WITH DIFFERENTIAL/PLATELET
Basophils Absolute: 0 10*3/uL (ref 0.0–0.2)
Basos: 0 %
EOS (ABSOLUTE): 0.4 10*3/uL (ref 0.0–0.4)
Eos: 5 %
Hematocrit: 38.7 % (ref 37.5–51.0)
Hemoglobin: 12.5 g/dL — ABNORMAL LOW (ref 13.0–17.7)
Immature Grans (Abs): 0 10*3/uL (ref 0.0–0.1)
Immature Granulocytes: 0 %
Lymphocytes Absolute: 1.4 10*3/uL (ref 0.7–3.1)
Lymphs: 22 %
MCH: 24.6 pg — ABNORMAL LOW (ref 26.6–33.0)
MCHC: 32.3 g/dL (ref 31.5–35.7)
MCV: 76 fL — ABNORMAL LOW (ref 79–97)
Monocytes Absolute: 0.8 10*3/uL (ref 0.1–0.9)
Monocytes: 12 %
Neutrophils Absolute: 4 10*3/uL (ref 1.4–7.0)
Neutrophils: 61 %
Platelets: 121 10*3/uL — ABNORMAL LOW (ref 150–450)
RBC: 5.09 x10E6/uL (ref 4.14–5.80)
RDW: 16.1 % — ABNORMAL HIGH (ref 12.3–15.4)
WBC: 6.6 10*3/uL (ref 3.4–10.8)

## 2018-02-04 LAB — DRUG SCREEN 764883 11+OXYCO+ALC+CRT-BUND
Amphetamines, Urine: NEGATIVE ng/mL
BENZODIAZ UR QL: NEGATIVE ng/mL
Barbiturate: NEGATIVE ng/mL
Cocaine (Metabolite): NEGATIVE ng/mL
Creatinine: 184.4 mg/dL (ref 20.0–300.0)
Ethanol: NEGATIVE %
Meperidine: NEGATIVE ng/mL
Methadone Screen, Urine: NEGATIVE ng/mL
OPIATE SCREEN URINE: NEGATIVE ng/mL
Phencyclidine: NEGATIVE ng/mL
Propoxyphene: NEGATIVE ng/mL
Tramadol: NEGATIVE ng/mL
pH, Urine: 6.2 (ref 4.5–8.9)

## 2018-02-04 LAB — OXYCODONE/OXYMORPHONE, CONFIRM
OXYCODONE/OXYMORPH: POSITIVE — AB
OXYCODONE: 1178 ng/mL
OXYCODONE: POSITIVE — AB
OXYMORPHONE (GC/MS): 1451 ng/mL
OXYMORPHONE: POSITIVE — AB

## 2018-02-04 LAB — CANNABINOID CONFIRMATION, UR
CANNABINOIDS: POSITIVE — AB
Carboxy THC GC/MS Conf: 161 ng/mL

## 2018-02-21 ENCOUNTER — Emergency Department (HOSPITAL_BASED_OUTPATIENT_CLINIC_OR_DEPARTMENT_OTHER)
Admission: EM | Admit: 2018-02-21 | Discharge: 2018-02-22 | Disposition: A | Discharge status: Home / Self Care | Payer: Self-pay | Attending: Emergency Medicine | Admitting: Emergency Medicine

## 2018-02-21 ENCOUNTER — Other Ambulatory Visit: Payer: Self-pay

## 2018-02-21 ENCOUNTER — Encounter (HOSPITAL_BASED_OUTPATIENT_CLINIC_OR_DEPARTMENT_OTHER): Payer: Self-pay | Admitting: *Deleted

## 2018-02-21 DIAGNOSIS — D5700 Hb-SS disease with crisis, unspecified: Secondary | ICD-10-CM | POA: Insufficient documentation

## 2018-02-21 DIAGNOSIS — Z79899 Other long term (current) drug therapy: Secondary | ICD-10-CM | POA: Insufficient documentation

## 2018-02-21 LAB — CBC WITH DIFFERENTIAL/PLATELET
ABS IMMATURE GRANULOCYTES: 0.01 10*3/uL (ref 0.00–0.07)
BASOS ABS: 0 10*3/uL (ref 0.0–0.1)
BASOS PCT: 0 %
EOS ABS: 0.2 10*3/uL (ref 0.0–0.5)
EOS PCT: 4 %
HCT: 33.7 % — ABNORMAL LOW (ref 39.0–52.0)
Hemoglobin: 11.1 g/dL — ABNORMAL LOW (ref 13.0–17.0)
Immature Granulocytes: 0 %
LYMPHS ABS: 1.7 10*3/uL (ref 0.7–4.0)
Lymphocytes Relative: 27 %
MCH: 24.7 pg — ABNORMAL LOW (ref 26.0–34.0)
MCHC: 32.9 g/dL (ref 30.0–36.0)
MCV: 75.1 fL — AB (ref 80.0–100.0)
MONOS PCT: 10 %
Monocytes Absolute: 0.6 10*3/uL (ref 0.1–1.0)
NEUTROS PCT: 59 %
NRBC: 0 % (ref 0.0–0.2)
Neutro Abs: 3.7 10*3/uL (ref 1.7–7.7)
PLATELETS: 92 10*3/uL — AB (ref 150–400)
RBC: 4.49 MIL/uL (ref 4.22–5.81)
RDW: 15.5 % (ref 11.5–15.5)
Smear Review: NORMAL
WBC: 6.2 10*3/uL (ref 4.0–10.5)

## 2018-02-21 LAB — BASIC METABOLIC PANEL
ANION GAP: 5 (ref 5–15)
BUN: 7 mg/dL (ref 6–20)
CALCIUM: 8.4 mg/dL — AB (ref 8.9–10.3)
CO2: 26 mmol/L (ref 22–32)
Chloride: 109 mmol/L (ref 98–111)
Creatinine, Ser: 0.74 mg/dL (ref 0.61–1.24)
Glucose, Bld: 112 mg/dL — ABNORMAL HIGH (ref 70–99)
POTASSIUM: 3.2 mmol/L — AB (ref 3.5–5.1)
Sodium: 140 mmol/L (ref 135–145)

## 2018-02-21 MED ORDER — MORPHINE SULFATE (PF) 4 MG/ML IV SOLN
8.0000 mg | Freq: Once | INTRAVENOUS | Status: AC
  Administered 2018-02-21: 8 mg via INTRAVENOUS
  Filled 2018-02-21: qty 2

## 2018-02-21 MED ORDER — ONDANSETRON HCL 4 MG/2ML IJ SOLN
4.0000 mg | Freq: Once | INTRAMUSCULAR | Status: AC
  Administered 2018-02-21: 4 mg via INTRAVENOUS
  Filled 2018-02-21: qty 2

## 2018-02-21 MED ORDER — SODIUM CHLORIDE 0.9 % IV SOLN
INTRAVENOUS | Status: DC
  Administered 2018-02-21: 23:00:00 via INTRAVENOUS

## 2018-02-21 NOTE — ED Provider Notes (Signed)
[facility] EMERGENCY DEPARTMENT Provider Note   CSN: [id number] Arrival date & time: 02/21/18  2017     History   Chief Complaint Chief Complaint  Patient presents with  . Sickle Cell Pain Crisis    HPI Paul Campos is a 27 y.o. male.  The history is provided by the patient.  Sickle Cell Pain Crisis  Location:  Lower extremity Severity:  Moderate Onset quality:  Gradual Duration:  2 days Similar to previous crisis episodes: yes   Timing:  Constant Progression:  Worsening Chronicity:  Recurrent Sickle cell genotype:  SS Usual hemoglobin level:  States usually it is high Date of last transfusion:  2-3 year ago History of pulmonary emboli: no   Context: not alcohol consumption, not change in medication, not dehydration, not infection and not non-compliance   Context comment:  States his crises are always worse with weather changes Relieved by:  Nothing Worsened by:  Activity and movement Ineffective treatments:  Prescription drugs (He has 20 mg of instant release oxytoca codon that he has been taking over the last 2 days and his left leg pain has improved but his right leg pain is still present) Associated symptoms: no chest pain, no congestion, no cough, no fever, no leg ulcers, no nausea, no shortness of breath, no swelling of legs and no vomiting   Risk factors: no frequent admissions for fever, no frequent admissions for pain and no prior acute chest     Past Medical History:  Diagnosis Date  . Sickle cell anemia (HCC)   . Sickle cell anemia [facility](HCC)     Patient Active Problem List   Diagnosis Date Noted  . 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 Hypokalemia 08/21/2012  . Thrombocytopenia, unspecified (HCC) 08/19/2012  . CAP (community acquired pneumonia) 08/18/2012  . Sickle cell pain crisis (HCC) 05/06/2012  . Chest pain 05/06/2012    Past Surgical History:  Procedure Laterality Date  . APPENDECTOMY          Home Medications     Prior to Admission medications   Medication Sig Start Date End Date Taking? Authorizing Provider  folic acid (FOLVITE) 1 MG tablet Take 1 tablet (1 mg total) by mouth daily. 12/26/17   Mike Gipouglas, Andre, FNP  hydroxyurea (HYDREA) 500 MG capsule Take 2 capsules (1,000 mg total) by mouth daily. May take with food to minimize GI side effects. 12/26/17 03/26/18  Mike Gipouglas, Andre, FNP  ibuprofen (ADVIL,MOTRIN) 800 MG tablet Take 1 tablet (800 mg total) by mouth every 8 (eight) hours as needed. 12/26/17   Mike Gipouglas, Andre, FNP    Family History Family History  Problem Relation Age of Onset  . Lung cancer Mother     Social History Social History   Tobacco Use  . Smoking status: Never Smoker  . Smokeless tobacco: Never Used  Substance Use Topics  . Alcohol use: No  . Drug use: No     Allergies   Dilaudid [hydromorphone hcl]; Hydromorphone; and Cefotaxime   Review of Systems Review of Systems  Constitutional: Negative for fever.  HENT: Negative for congestion.   Respiratory: Negative for cough and shortness of breath.   Cardiovascular: Negative for chest pain.  Gastrointestinal: Negative for nausea and vomiting.  All other systems reviewed and are negative.    Physical Exam Updated Vital Signs BP 116/77   Pulse 73   Temp 98.8 F (37.1 C) (Oral)   Resp 18   Ht 6' (1.829 m)   Wt 84.8 kg  SpO2 100%   BMI 25.36 kg/m   Physical Exam Vitals signs and nursing note reviewed.  Constitutional:      General: He is not in acute distress.    Appearance: He is well-developed.  HENT:     Head: Normocephalic and atraumatic.  Eyes:     Conjunctiva/sclera: Conjunctivae normal.     Pupils: Pupils are equal, round, and reactive to light.  Neck:     Musculoskeletal: Normal range of motion and neck supple.  Cardiovascular:     Rate and Rhythm: Normal rate and regular rhythm.     Heart sounds: No murmur.  Pulmonary:     Effort: Pulmonary effort is normal. No respiratory distress.      Breath sounds: Normal breath sounds. No wheezing or rales.  Abdominal:     General: There is no distension.     Palpations: Abdomen is soft.     Tenderness: There is no abdominal tenderness. There is no guarding or rebound.  Musculoskeletal: Normal range of motion.        General: Tenderness present.     Right upper leg: He exhibits tenderness and bony tenderness.       Legs:     Comments: No leg swelling or joint swelling.  No rashes  Skin:    General: Skin is warm and dry.     Findings: No erythema or rash.  Neurological:     Mental Status: He is alert and oriented to person, place, and time.  Psychiatric:        Behavior: Behavior normal.      ED Treatments / Results  Labs (all labs ordered are listed, but only abnormal results are displayed) Labs Reviewed  CBC WITH DIFFERENTIAL/PLATELET - Abnormal; Notable for the following components:      Result Value   Hemoglobin 11.1 (*)    HCT 33.7 (*)    MCV 75.1 (*)    MCH 24.7 (*)    Platelets 92 (*)    All other components within normal limits  BASIC METABOLIC PANEL - Abnormal; Notable for the following components:   Potassium 3.2 (*)    Glucose, Bld 112 (*)    Calcium 8.4 (*)    All other components within normal limits  RETICULOCYTES    EKG None  Radiology No results found.  Procedures Procedures (including critical care time)  Medications Ordered in ED Medications  0.9 %  sodium chloride infusion ( Intravenous New Bag/Given 02/21/18 2232)  morphine 4 MG/ML injection 8 mg (8 mg Intravenous Given 02/21/18 2239)  ondansetron (ZOFRAN) injection 4 mg (4 mg Intravenous Given 02/21/18 2237)     Initial Impression / Assessment and Plan / ED Course  I have reviewed the triage vital signs and the nursing notes.  Pertinent labs & imaging results that were available during my care of the patient were reviewed by me and considered in my medical decision making (see chart for details).    Patient with sickle cell  disease who presents today with crisis.  He has pain in the right leg for the last few days.  He states it initially started in his left leg but with his home narcotics that was getting better but the right leg pain was persistent.  He denies any infectious symptoms and visual exam of his leg is within normal limits.  Pain is reproduced with palpation.  Low suspicion for septic joint, compartment syndrome.  Patient denies any trauma.  Patient given IV pain control and  will check labs.  11:29 PM Hemoglobin remained stable at 11.  Mild thrombocytopenia today with platelet count of 92 which he has had intermittently in the past.  BMP without acute findings.  Reticulocyte count pending.  Patient still having pain after the first round of morphine was given a second.  Final Clinical Impressions(s) / ED Diagnoses   Final diagnoses:  Sickle cell pain crisis [facility](HCC)    ED Discharge Orders    None       Gwyneth SproutPlunkett, Hal Norrington, MD 02/21/18 2330

## 2018-02-21 NOTE — ED Notes (Signed)
C/o sickle cell pain to rt leg x 2 days  Oxycodone 20 mg taken at home not helping

## 2018-02-21 NOTE — ED Triage Notes (Signed)
Sickle cell pain in his right leg x 2 days. He has been taking pain medications for leg pain that goes from his left leg to his right leg. States he can't take the pain any longer.

## 2018-02-22 LAB — RETICULOCYTES
Immature Retic Fract: 33.5 % — ABNORMAL HIGH (ref 2.3–15.9)
RBC.: 4.44 MIL/uL (ref 4.22–5.81)
RETIC CT PCT: 3.4 % — AB (ref 0.4–3.1)
Retic Count, Absolute: 151.8 10*3/uL (ref 19.0–186.0)

## 2018-02-22 MED ORDER — MORPHINE SULFATE (PF) 4 MG/ML IV SOLN
8.0000 mg | Freq: Once | INTRAVENOUS | Status: AC
  Administered 2018-02-22: 8 mg via INTRAVENOUS
  Filled 2018-02-22: qty 2

## 2018-02-24 ENCOUNTER — Telehealth: Payer: Self-pay

## 2018-02-24 DIAGNOSIS — D571 Sickle-cell disease without crisis: Secondary | ICD-10-CM

## 2018-02-24 NOTE — Telephone Encounter (Signed)
Refill request for oxycodone and Ibuprofen. Please advise. Thanks!

## 2018-02-27 MED ORDER — IBUPROFEN 800 MG PO TABS: 800.0000 mg | ORAL_TABLET | Freq: Three times a day (TID) | ORAL | 1 refills | Status: DC | PRN

## 2018-02-27 MED ORDER — OXYCODONE HCL 20 MG PO TABS: 20.0000 mg | ORAL_TABLET | Freq: Four times a day (QID) | ORAL | 0 refills | Status: DC | PRN

## 2018-02-27 NOTE — Telephone Encounter (Signed)
Reviewed [place] Substance Reporting system prior to prescribing opiate medications. No inconsistencies noted.   

## 2018-03-01 ENCOUNTER — Ambulatory Visit: Payer: Self-pay | Admitting: Family Medicine

## 2018-03-06 ENCOUNTER — Ambulatory Visit: Payer: Self-pay | Admitting: Family Medicine

## 2018-03-12 ENCOUNTER — Emergency Department (HOSPITAL_BASED_OUTPATIENT_CLINIC_OR_DEPARTMENT_OTHER)
Admission: EM | Admit: 2018-03-12 | Discharge: 2018-03-12 | Disposition: A | Discharge status: Home / Self Care | Payer: Self-pay | Attending: Emergency Medicine | Admitting: Emergency Medicine

## 2018-03-12 ENCOUNTER — Other Ambulatory Visit: Payer: Self-pay

## 2018-03-12 ENCOUNTER — Encounter (HOSPITAL_BASED_OUTPATIENT_CLINIC_OR_DEPARTMENT_OTHER): Payer: Self-pay | Admitting: *Deleted

## 2018-03-12 DIAGNOSIS — M549 Dorsalgia, unspecified: Secondary | ICD-10-CM | POA: Insufficient documentation

## 2018-03-12 DIAGNOSIS — Z79899 Other long term (current) drug therapy: Secondary | ICD-10-CM | POA: Insufficient documentation

## 2018-03-12 LAB — COMPREHENSIVE METABOLIC PANEL
ALT: 15 U/L (ref 0–44)
AST: 21 U/L (ref 15–41)
Albumin: 4.7 g/dL (ref 3.5–5.0)
Alkaline Phosphatase: 57 U/L (ref 38–126)
Anion gap: 8 (ref 5–15)
BUN: 5 mg/dL — ABNORMAL LOW (ref 6–20)
CALCIUM: 9.1 mg/dL (ref 8.9–10.3)
CO2: 25 mmol/L (ref 22–32)
Chloride: 104 mmol/L (ref 98–111)
Creatinine, Ser: 0.98 mg/dL (ref 0.61–1.24)
GFR calc Af Amer: >60 mL/min (ref >60)
GFR calc non Af Amer: >60 mL/min (ref >60)
Glucose, Bld: 99 mg/dL (ref 70–99)
Potassium: 3.6 mmol/L (ref 3.5–5.1)
SODIUM: 137 mmol/L (ref 135–145)
Total Bilirubin: 1.5 mg/dL — ABNORMAL HIGH (ref 0.3–1.2)
Total Protein: 7.9 g/dL (ref 6.5–8.1)

## 2018-03-12 LAB — CBC WITH DIFFERENTIAL/PLATELET
Abs Immature Granulocytes: 0.01 10*3/uL (ref 0.00–0.07)
Basophils Absolute: 0 10*3/uL (ref 0.0–0.1)
Basophils Relative: 0 %
Eosinophils Absolute: 0.2 10*3/uL (ref 0.0–0.5)
Eosinophils Relative: 4 %
HCT: 39.7 % (ref 39.0–52.0)
Hemoglobin: 13.3 g/dL (ref 13.0–17.0)
Immature Granulocytes: 0 %
LYMPHS PCT: 31 %
Lymphs Abs: 1.8 10*3/uL (ref 0.7–4.0)
MCH: 24.9 pg — ABNORMAL LOW (ref 26.0–34.0)
MCHC: 33.5 g/dL (ref 30.0–36.0)
MCV: 74.2 fL — ABNORMAL LOW (ref 80.0–100.0)
Monocytes Absolute: 0.7 10*3/uL (ref 0.1–1.0)
Monocytes Relative: 11 %
Neutro Abs: 3.1 10*3/uL (ref 1.7–7.7)
Neutrophils Relative %: 54 %
Platelets: 142 10*3/uL — ABNORMAL LOW (ref 150–400)
RBC: 5.35 MIL/uL (ref 4.22–5.81)
RDW: 14.8 % (ref 11.5–15.5)
WBC: 5.8 10*3/uL (ref 4.0–10.5)
nRBC: 0 % (ref 0.0–0.2)

## 2018-03-12 LAB — RETICULOCYTES
Immature Retic Fract: 24.1 % — ABNORMAL HIGH (ref 2.3–15.9)
RBC.: 5.37 MIL/uL (ref 4.22–5.81)
RETIC CT PCT: 2.9 % (ref 0.4–3.1)
Retic Count, Absolute: 154.1 10*3/uL (ref 19.0–186.0)

## 2018-03-12 MED ORDER — ONDANSETRON HCL 4 MG/2ML IJ SOLN
4.0000 mg | INTRAMUSCULAR | Status: DC | PRN
  Administered 2018-03-12: 4 mg via INTRAVENOUS
  Filled 2018-03-12: qty 2

## 2018-03-12 MED ORDER — SODIUM CHLORIDE 0.9 % IV BOLUS (SEPSIS)
1000.0000 mL | Freq: Once | INTRAVENOUS | Status: AC
  Administered 2018-03-12: 1000 mL via INTRAVENOUS

## 2018-03-12 MED ORDER — MORPHINE SULFATE (PF) 4 MG/ML IV SOLN
8.0000 mg | INTRAVENOUS | Status: AC | PRN
  Administered 2018-03-12 (×3): 8 mg via INTRAVENOUS
  Filled 2018-03-12 (×3): qty 2

## 2018-03-12 NOTE — ED Provider Notes (Signed)
TIME SEEN: 3:41 AM  CHIEF COMPLAINT: Sickle cell pain crisis  HPI: Patient is a 28 year old male with history of sickle cell anemia who presents to the emergency department with sickle cell pain crisis that started yesterday.  Reports sharp back pain throughout his entire back that is typical of previous sickle cell crisis.  No injury to the back.  No numbness, tingling, weakness, bowel or bladder incontinence.  No fevers, chest pain or shortness of breath.  States he has tried his oxycodone 20 mg and ibuprofen 800 mg at home without relief.  Pain worsened tonight.  Hemoglobin normally runs between 11 and 12.  Last transfusion was several years ago.  ROS: See HPI Constitutional: no fever  Eyes: no drainage  ENT: no runny nose   Cardiovascular:  no chest pain  Resp: no SOB  GI: no vomiting GU: no dysuria Integumentary: no rash  Allergy: no hives  Musculoskeletal: no leg swelling  Neurological: no slurred speech ROS otherwise negative  PAST MEDICAL HISTORY/PAST SURGICAL HISTORY:  Past Medical History:  Diagnosis Date  . Sickle cell anemia (HCC)   . Sickle cell anemia (HCC)     MEDICATIONS:  Prior to Admission medications   Medication Sig Start Date End Date Taking? Authorizing Provider  folic acid (FOLVITE) 1 MG tablet Take 1 tablet (1 mg total) by mouth daily. 12/26/17   Mike Gipouglas, Andre, FNP  hydroxyurea (HYDREA) 500 MG capsule Take 2 capsules (1,000 mg total) by mouth daily. May take with food to minimize GI side effects. 12/26/17 03/26/18  Mike Gipouglas, Andre, FNP  ibuprofen (ADVIL,MOTRIN) 800 MG tablet Take 1 tablet (800 mg total) by mouth every 8 (eight) hours as needed. 02/27/18   Mike Gipouglas, Andre, FNP  Oxycodone HCl 20 MG TABS Take 1 tablet (20 mg total) by mouth every 6 (six) hours as needed for up to 15 days. 02/27/18 03/14/18  Mike Gipouglas, Andre, FNP    ALLERGIES:  Allergies  Allergen Reactions  . Dilaudid [Hydromorphone Hcl] Hives  . Hydromorphone Itching    Morphine okay.  . Cefotaxime  Itching and Rash    Hives    SOCIAL HISTORY:  Social History   Tobacco Use  . Smoking status: Never Smoker  . Smokeless tobacco: Never Used  Substance Use Topics  . Alcohol use: No    FAMILY HISTORY: Family History  Problem Relation Age of Onset  . Lung cancer Mother     EXAM: BP 125/87 (BP Location: Right Arm)   Pulse (!) 52   Temp 98.1 F (36.7 C) (Oral)   Resp 20   SpO2 100%  CONSTITUTIONAL: Alert and oriented and responds appropriately to questions.  Appears uncomfortable HEAD: Normocephalic EYES: Conjunctivae clear, pupils appear equal, EOMI ENT: normal nose; moist mucous membranes NECK: Supple, no meningismus, no nuchal rigidity, no LAD  CARD: RRR; S1 and S2 appreciated; no murmurs, no clicks, no rubs, no gallops RESP: Normal chest excursion without splinting or tachypnea; breath sounds clear and equal bilaterally; no wheezes, no rhonchi, no rales, no hypoxia or respiratory distress, speaking full sentences ABD/GI: Normal bowel sounds; non-distended; soft, non-tender, no rebound, no guarding, no peritoneal signs, no hepatosplenomegaly BACK:  The back appears normal and is n here to palpation diffusely without step-off or deformity EXT: Normal ROM in all joints; non-tender to palpation; no edema; normal capillary refill; no cyanosis, no calf tenderness or swelling    SKIN: Normal color for age and race; warm; no rash NEURO: Moves all extremities equally with normal sensation diffusely, able  to ambulate normally, normal speech, no facial asymmetry PSYCH: The patient's mood and manner are appropriate. Grooming and personal hygiene are appropriate.  MEDICAL DECISION MAKING: Patient here with sickle cell pain crisis.  No focal neurologic deficits.  Patient reports IV fluids, morphine and Zofran normally resolve his symptoms.  It appears he has previously received 8 mg of morphine at a time with good pain relief.  Will obtain labs today and treat symptomatically.  ED  PROGRESS: Patient's labs are very reassuring today.  His hemoglobin is 13.  His reticulocyte count is normal and there is no significant elevation of his total bilirubin.  Discussed with patient that this may be musculoskeletal back pain over a true sickle cell crisis.  Have advised him to continue his pain medications at home as prescribed and follow-up with his PCP.  Doubt cauda equina, epidural abscess or hematoma, discitis or osteomyelitis, fracture.  He denies injury to his back.  He has no neurologic deficits.  No fever.  He has pain medications at home.  He is comfortable with this plan reports his pain is well controlled after 3 rounds of IV morphine.   At this time, I do not feel there is any life-threatening condition present. I have reviewed and discussed all results (EKG, imaging, lab, urine as appropriate) and exam findings with patient/family. I have reviewed nursing notes and appropriate previous records.  I feel the patient is safe to be discharged home without further emergent workup and can continue workup as an outpatient as needed. Discussed usual and customary return precautions. Patient/family verbalize understanding and are comfortable with this plan.  Outpatient follow-up has been provided as needed. All questions have been answered.      Anel Purohit, Layla MawKristen N, DO 03/12/18 [phone]

## 2018-03-12 NOTE — ED Notes (Signed)
ED Provider at bedside. 

## 2018-03-12 NOTE — ED Triage Notes (Addendum)
Pt presents with c/o sickle cell pain to his back. States pain started yesterday. Denies any fevers. States he took ibuprofen 800mg  and Oxycodone 20mg  at 2130. States pain is constant and sharp. Pt states he has a ride home. Denies cp/sob.

## 2018-03-31 ENCOUNTER — Telehealth: Payer: Self-pay

## 2018-03-31 DIAGNOSIS — D571 Sickle-cell disease without crisis: Secondary | ICD-10-CM

## 2018-03-31 MED ORDER — OXYCODONE HCL 20 MG PO TABS: 20.0000 mg | ORAL_TABLET | Freq: Four times a day (QID) | ORAL | 0 refills | Status: DC | PRN

## 2018-03-31 MED ORDER — FOLIC ACID 1 MG PO TABS: 1.0000 mg | ORAL_TABLET | Freq: Every day | ORAL | 11 refills | Status: DC

## 2018-03-31 NOTE — Telephone Encounter (Signed)
Refill request for oxycodone and folic acid received via mychart. Please advise if oxycodone can be refilled. Sent in folic acid

## 2018-04-26 ENCOUNTER — Ambulatory Visit (INDEPENDENT_AMBULATORY_CARE_PROVIDER_SITE_OTHER): Payer: Self-pay | Admitting: Family Medicine

## 2018-04-26 ENCOUNTER — Encounter: Payer: Self-pay | Admitting: Family Medicine

## 2018-04-26 ENCOUNTER — Other Ambulatory Visit: Payer: Self-pay

## 2018-04-26 DIAGNOSIS — D571 Sickle-cell disease without crisis: Secondary | ICD-10-CM

## 2018-04-26 LAB — POCT URINALYSIS DIP (CLINITEK)
Bilirubin, UA: NEGATIVE
Blood, UA: NEGATIVE
Glucose, UA: NEGATIVE mg/dL
Ketones, POC UA: NEGATIVE mg/dL
Nitrite, UA: NEGATIVE
POC PROTEIN,UA: NEGATIVE
Spec Grav, UA: 1.015 (ref 1.010–1.025)
Urobilinogen, UA: 0.2 E.U./dL
pH, UA: 6.5 (ref 5.0–8.0)

## 2018-04-26 MED ORDER — HYDROXYUREA 500 MG PO CAPS: 1000.0000 mg | ORAL_CAPSULE | Freq: Every day | ORAL | 2 refills | Status: DC

## 2018-04-26 MED ORDER — OXYCODONE HCL 20 MG PO TABS: 20.0000 mg | ORAL_TABLET | Freq: Four times a day (QID) | ORAL | 0 refills | Status: DC | PRN

## 2018-04-26 NOTE — Progress Notes (Signed)
[facility] CELL CARE  SICKLE CELL ANEMIA FOLLOW UP VISIT PROVIDER: Mike Gip, FNP    Subjective:   Paul Campos  is a 28 y.o.  male who  has a past medical history of Sickle cell anemia (HCC) and Sickle cell anemia (HCC). presents for a follow up for Sickle Cell Anemia. The patient has had 1 ED visit  in the past 6 months.  Pain regimen includes: Ibuprofen and oxycodone 10mg  Hydrea Therapy: Yes Medication compliance: Yes  Pain today is 3/10 and is both arms The patient reports adequate daily hydration.      Review of Systems  Constitutional: Negative.   HENT: Negative.   Eyes: Negative.   Respiratory: Negative.   Cardiovascular: Negative.   Gastrointestinal: Negative.   Genitourinary: Negative.   Musculoskeletal: Negative.   Skin: Negative.   Neurological: Negative.   Psychiatric/Behavioral: Negative.     Objective:   Objective  BP 126/76 (BP Location: Right Arm, Patient Position: Sitting)   Pulse 69   Resp 14   Ht 6' (1.829 m)   Wt 186 lb 3.2 oz (84.5 kg)   SpO2 100%   BMI 25.25 kg/m   Wt Readings from Last 3 Encounters:  04/26/18 186 lb 3.2 oz (84.5 kg)  02/21/18 187 lb (84.8 kg)  01/30/18 183 lb (83 kg)     Physical Exam Vitals signs and nursing note reviewed.  Constitutional:      General: He is not in acute distress.    Appearance: He is well-developed.  HENT:     Head: Normocephalic and atraumatic.  Eyes:     Conjunctiva/sclera: Conjunctivae normal.     Pupils: Pupils are equal, round, and reactive to light.  Neck:     Musculoskeletal: Normal range of motion.  Cardiovascular:     Rate and Rhythm: Normal rate and regular rhythm.     Heart sounds: Normal heart sounds.  Pulmonary:     Effort: Pulmonary effort is normal. No respiratory distress.     Breath sounds: Normal breath sounds.  Abdominal:     General: Bowel sounds are normal. There is no distension.     Palpations: Abdomen is soft.    Musculoskeletal: Normal range of motion.  Skin:    General: Skin is warm and dry.  Neurological:     Mental Status: He is alert and oriented to person, place, and time.  Psychiatric:        Behavior: Behavior normal.        Thought Content: Thought content normal.      Assessment/Plan:   Assessment   Encounter Diagnosis  Name Primary?  Marland Kitchen Hb-SS disease without crisis (HCC)      Plan  1. Hb-SS disease without crisis (HCC) - hydroxyurea (HYDREA) 500 MG capsule; Take 2 capsules (1,000 mg total) by mouth daily. May take with food to minimize GI side effects.  Dispense: 60 capsule; Refill: 2 - Oxycodone HCl 20 MG TABS; Take 1 tablet (20 mg total) by mouth every 6 (six) hours as needed for up to 16 days.  Dispense: 64 tablet; Refill: 0 - Microalbumin, urine - POCT URINALYSIS DIP (CLINITEK) - Urine Drug Panel 7 - CBC with Differential - Comprehensive metabolic panel   Return to care as scheduled and prn. Patient verbalized understanding and agreed with plan of care.   1. Sickle cell disease - Continue Hydrea   We discussed the need for good hydration, monitoring of hydration status, avoidance of heat, cold,  stress, and infection triggers. We discussed the risks and benefits of Hydrea, including bone marrow suppression, the possibility of GI upset, skin ulcers, hair thinning, and teratogenicity. The patient was reminded of the need to seek medical attention of any symptoms of bleeding, anemia, or infection. Continue folic acid 1 mg daily to prevent aplastic bone marrow crises.   2. Pulmonary evaluation - Patient denies severe recurrent wheezes, shortness of breath with exercise, or persistent cough. If these symptoms develop, pulmonary function tests with spirometry will be ordered, and if abnormal, plan on referral to Pulmonology for further evaluation.  3. Cardiac - Routine screening for pulmonary hypertension is not recommended.  4. Eye - High risk of proliferative retinopathy.  Annual eye exam with retinal exam recommended to patient.  5. Immunization status -  Yearly influenza vaccination is recommended, as well as being up to date with Meningococcal and Pneumococcal vaccines.   6. Acute and chronic painful episodes - We discussed that pt is to receive Schedule II prescriptions only from [place]. Pt is also aware that the prescription history is available to [place] online through the [facility] CSRS. Controlled substance agreement signed. We reminded [place] that all patients receiving Schedule II narcotics must be seen for follow within one month of prescription being requested. We reviewed the terms of our pain agreement, including the need to keep medicines in a safe locked location away from children or pets, and the need to report excess sedation or constipation, measures to avoid constipation, and policies related to early refills and stolen prescriptions. According to the [place] [facility] [employer] program, we have reviewed details related to analgesia, adverse effects, aberrant behaviors.  7. Iron overload from chronic transfusion.  Not applicable at this time.  If this occurs will use Exjade for management.   8. Vitamin D deficiency - Drisdol 50,000 units weekly. Patient encouraged to take as prescribed.   The above recommendations are taken from the NIH Evidence-Based Management of Sickle Cell Disease: Expert Panel Report, 59458.   Ms. Andr L. Riley Lam, FNP-BC [facility] [address] [place], [place] [address] [phone]  This note has been created with Dragon speech recognition software and smart phrase technology. Any transcriptional errors are unintentional.

## 2018-04-26 NOTE — Patient Instructions (Signed)
Sickle Cell Anemia, Adult °Sickle cell anemia is a condition where your red blood cells are shaped like sickles. Red blood cells carry oxygen through the body. Sickle-shaped cells do not live as long as normal red blood cells. They also clump together and block blood from flowing through the blood vessels. This prevents the body from getting enough oxygen. Sickle cell anemia causes organ damage and pain. It also increases the risk of infection. °Follow these instructions at home: °Medicines °· Take over-the-counter and prescription medicines only as told by your doctor. °· If you were prescribed an antibiotic medicine, take it as told by your doctor. Do not stop taking the antibiotic even if you start to feel better. °· If you develop a fever, do not take medicines to lower the fever right away. Tell your doctor about the fever. °Managing pain, stiffness, and swelling °· Try these methods to help with pain: °? Use a heating pad. °? Take a warm bath. °? Distract yourself, such as by watching TV. °Eating and drinking °· Drink enough fluid to keep your pee (urine) clear or pale yellow. Drink more in hot weather and during exercise. °· Limit or avoid alcohol. °· Eat a healthy diet. Eat plenty of fruits, vegetables, whole grains, and lean protein. °· Take vitamins and supplements as told by your doctor. °Traveling °· When traveling, keep these with you: °? Your medical information. °? The names of your doctors. °? Your medicines. °· If you need to take an airplane, talk to your doctor first. °Activity °· Rest often. °· Avoid exercises that make your heart beat much faster, such as jogging. °General instructions °· Do not use products that have nicotine or tobacco, such as cigarettes and e-cigarettes. If you need help quitting, ask your doctor. °· Consider wearing a medical alert bracelet. °· Avoid being in high places (high altitudes), such as mountains. °· Avoid very hot or cold temperatures. °· Avoid places where the  temperature changes a lot. °· Keep all follow-up visits as told by your doctor. This is important. °Contact a doctor if: °· A joint hurts. °· Your feet or hands hurt or swell. °· You feel tired (fatigued). °Get help right away if: °· You have symptoms of infection. These include: °? Fever. °? Chills. °? Being very tired. °? Irritability. °? Poor eating. °? Throwing up (vomiting). °· You feel dizzy or faint. °· You have new stomach pain, especially on the left side. °· You have a an erection (priapism) that lasts more than 4 hours. °· You have numbness in your arms or legs. °· You have a hard time moving your arms or legs. °· You have trouble talking. °· You have pain that does not go away when you take medicine. °· You are short of breath. °· You are breathing fast. °· You have a long-term cough. °· You have pain in your chest. °· You have a bad headache. °· You have a stiff neck. °· Your stomach looks bloated even though you did not eat much. °· Your skin is pale. °· You suddenly cannot see well. °Summary °· Sickle cell anemia is a condition where your red blood cells are shaped like sickles. °· Follow your doctor's advice on ways to manage pain, food to eat, activities to do, and steps to take for safe travel. °· Get medical help right away if you have any signs of infection, such as a fever. °This information is not intended to replace advice given to you by your   health care provider. Make sure you discuss any questions you have with your health care provider. °Document Released: 11/29/2012 Document Revised: 03/16/2016 Document Reviewed: 03/16/2016 °Elsevier Interactive Patient Education © 2019 [employer] ° °

## 2018-04-27 LAB — CBC WITH DIFFERENTIAL/PLATELET
Basophils Absolute: 0 10*3/uL (ref 0.0–0.2)
Basos: 1 %
EOS (ABSOLUTE): 0.4 10*3/uL (ref 0.0–0.4)
Eos: 6 %
Hematocrit: 38.4 % (ref 37.5–51.0)
Hemoglobin: 12.7 g/dL — ABNORMAL LOW (ref 13.0–17.7)
Immature Grans (Abs): 0 10*3/uL (ref 0.0–0.1)
Immature Granulocytes: 0 %
Lymphocytes Absolute: 1.8 10*3/uL (ref 0.7–3.1)
Lymphs: 30 %
MCH: 25.7 pg — ABNORMAL LOW (ref 26.6–33.0)
MCHC: 33.1 g/dL (ref 31.5–35.7)
MCV: 78 fL — ABNORMAL LOW (ref 79–97)
Monocytes Absolute: 0.8 10*3/uL (ref 0.1–0.9)
Monocytes: 14 %
Neutrophils Absolute: 2.9 10*3/uL (ref 1.4–7.0)
Neutrophils: 49 %
Platelets: 147 10*3/uL — ABNORMAL LOW (ref 150–450)
RBC: 4.94 x10E6/uL (ref 4.14–5.80)
RDW: 16.8 % — ABNORMAL HIGH (ref 11.6–15.4)
WBC: 5.9 10*3/uL (ref 3.4–10.8)

## 2018-04-27 LAB — COMPREHENSIVE METABOLIC PANEL
ALT: 14 IU/L (ref 0–44)
AST: 22 IU/L (ref 0–40)
Albumin/Globulin Ratio: 1.8 (ref 1.2–2.2)
Albumin: 4.6 g/dL (ref 4.1–5.2)
Alkaline Phosphatase: 66 IU/L (ref 39–117)
BUN/Creatinine Ratio: 7 — ABNORMAL LOW (ref 9–20)
BUN: 7 mg/dL (ref 6–20)
Bilirubin Total: 0.8 mg/dL (ref 0.0–1.2)
CO2: 22 mmol/L (ref 20–29)
Calcium: 9.1 mg/dL (ref 8.7–10.2)
Chloride: 103 mmol/L (ref 96–106)
Creatinine, Ser: 0.95 mg/dL (ref 0.76–1.27)
GFR calc Af Amer: 127 mL/min/{1.73_m2} (ref >59)
GFR calc non Af Amer: 110 mL/min/{1.73_m2} (ref >59)
Globulin, Total: 2.6 g/dL (ref 1.5–4.5)
Glucose: 67 mg/dL (ref 65–99)
Potassium: 4 mmol/L (ref 3.5–5.2)
Sodium: 141 mmol/L (ref 134–144)
Total Protein: 7.2 g/dL (ref 6.0–8.5)

## 2018-04-27 LAB — MICROALBUMIN, URINE: Microalbumin, Urine: 16.3 ug/mL

## 2018-05-01 LAB — URINE DRUG PANEL 7
Amphetamines, Urine: NEGATIVE ng/mL
Barbiturate Quant, Ur: NEGATIVE ng/mL
Benzodiazepine Quant, Ur: NEGATIVE ng/mL
Cannabinoid Quant, Ur: POSITIVE — AB
Cocaine (Metab.): NEGATIVE ng/mL
Opiate Quant, Ur: NEGATIVE ng/mL
PCP Quant, Ur: NEGATIVE ng/mL

## 2018-05-24 DIAGNOSIS — D571 Sickle-cell disease without crisis: Secondary | ICD-10-CM

## 2018-05-25 MED ORDER — IBUPROFEN 800 MG PO TABS: 800.0000 mg | ORAL_TABLET | Freq: Three times a day (TID) | ORAL | 1 refills | Status: DC | PRN

## 2018-05-25 MED ORDER — OXYCODONE HCL 20 MG PO TABS: 20.0000 mg | ORAL_TABLET | Freq: Four times a day (QID) | ORAL | 0 refills | Status: DC | PRN

## 2018-06-23 ENCOUNTER — Telehealth: Payer: Self-pay

## 2018-06-23 DIAGNOSIS — D571 Sickle-cell disease without crisis: Secondary | ICD-10-CM

## 2018-06-23 MED ORDER — OXYCODONE HCL 20 MG PO TABS: 20.0000 mg | ORAL_TABLET | Freq: Four times a day (QID) | ORAL | 0 refills | Status: DC | PRN

## 2018-06-23 NOTE — Telephone Encounter (Signed)
Refill request for oxycodone. Please advise. Thanks!  

## 2018-06-29 NOTE — Telephone Encounter (Signed)
Message sent to provider 

## 2018-07-26 ENCOUNTER — Telehealth: Payer: Self-pay

## 2018-07-26 NOTE — Telephone Encounter (Signed)
Called to do COVID -19 Screening. Patient preferred to have visit via telephone. Thanks! 

## 2018-07-27 ENCOUNTER — Encounter: Payer: Self-pay | Admitting: Family Medicine

## 2018-07-27 ENCOUNTER — Ambulatory Visit (INDEPENDENT_AMBULATORY_CARE_PROVIDER_SITE_OTHER): Payer: Self-pay | Admitting: Family Medicine

## 2018-07-27 ENCOUNTER — Other Ambulatory Visit: Payer: Self-pay

## 2018-07-27 DIAGNOSIS — D571 Sickle-cell disease without crisis: Secondary | ICD-10-CM

## 2018-07-27 MED ORDER — OXYCODONE HCL 20 MG PO TABS: 20.0000 mg | ORAL_TABLET | Freq: Four times a day (QID) | ORAL | 0 refills | Status: DC | PRN

## 2018-07-27 MED ORDER — HYDROXYUREA 500 MG PO CAPS: 1000.0000 mg | ORAL_CAPSULE | Freq: Every day | ORAL | 2 refills | Status: AC

## 2018-07-27 MED ORDER — FOLIC ACID 1 MG PO TABS: 1.0000 mg | ORAL_TABLET | Freq: Every day | ORAL | 11 refills | Status: DC

## 2018-07-27 NOTE — Progress Notes (Signed)
  [facility]  Virtual Visit via Telephone Note  I connected with Paul Campos on 07/27/18 at  9:00 AM EDT by telephone and verified that I am speaking with the correct person using two identifiers.   I discussed the limitations, risks, security and privacy concerns of performing an evaluation and management service by telephone and the availability of in person appointments. I also discussed with the patient that there may be a patient responsible charge related to this service. The patient expressed understanding and agreed to proceed.   History of Present Illness: Paul Campos  has a past medical history of Sickle cell anemia (HCC) and Sickle cell anemia (HCC).  Patient reports that he is doing well on his current medication regimine. He reports compliance with medications and denies side effects at the present time. He does have to take his pain medication every other day most days. He reports having a crisis last week that was managed at home.  Denies CP, SOB, dizziness or leg swelling/ulcerations Observations/Objective: Patient with regular voice tone, rate and rhythm. Speaking calmly and is in no apparent distress.    Assessment and Plan: 1. Hb-SS disease without crisis (HCC) Refilled medications. Will check labs at next office visit.  - hydroxyurea (HYDREA) 500 MG capsule; Take 2 capsules (1,000 mg total) by mouth daily. May take with food to minimize GI side effects.  Dispense: 60 capsule; Refill: 2 - folic acid (FOLVITE) 1 MG tablet; Take 1 tablet (1 mg total) by mouth daily.  Dispense: 30 tablet; Refill: 11 - Oxycodone HCl 20 MG TABS; Take 1 tablet (20 mg total) by mouth every 6 (six) hours as needed for up to 15 days.  Dispense: 60 tablet; Refill: 0     Follow Up Instructions:  We discussed hand washing, using hand sanitizer when soap and water are not available, only going out when absolutely necessary, and social distancing.  Explained to patient that he is immunocompromised and will need to take precautions during this time.   I discussed the assessment and treatment plan with the patient. The patient was provided an opportunity to ask questions and all were answered. The patient agreed with the plan and demonstrated an understanding of the instructions.   The patient was advised to call back or seek an in-person evaluation if the symptoms worsen or if the condition fails to improve as anticipated.  I provided 7 minutes of non-face-to-face time during this encounter.  Ms. Andr L. Riley Lam, FNP-BC [facility] [address] [place], [place] [address] [phone]

## 2018-08-28 DIAGNOSIS — D571 Sickle-cell disease without crisis: Secondary | ICD-10-CM

## 2018-08-28 MED ORDER — OXYCODONE HCL 20 MG PO TABS: 20.0000 mg | ORAL_TABLET | Freq: Four times a day (QID) | ORAL | 0 refills | Status: DC | PRN

## 2018-09-01 IMAGING — CR DG WRIST COMPLETE 3+V*R*
4 series · 4 of 4 positions shown · non-contrast
Comparison: None.

CLINICAL DATA: 25-year-old male with right wrist injury and pain.

EXAM:
RIGHT WRIST - COMPLETE 3+ VIEW

[x wrist pa right]
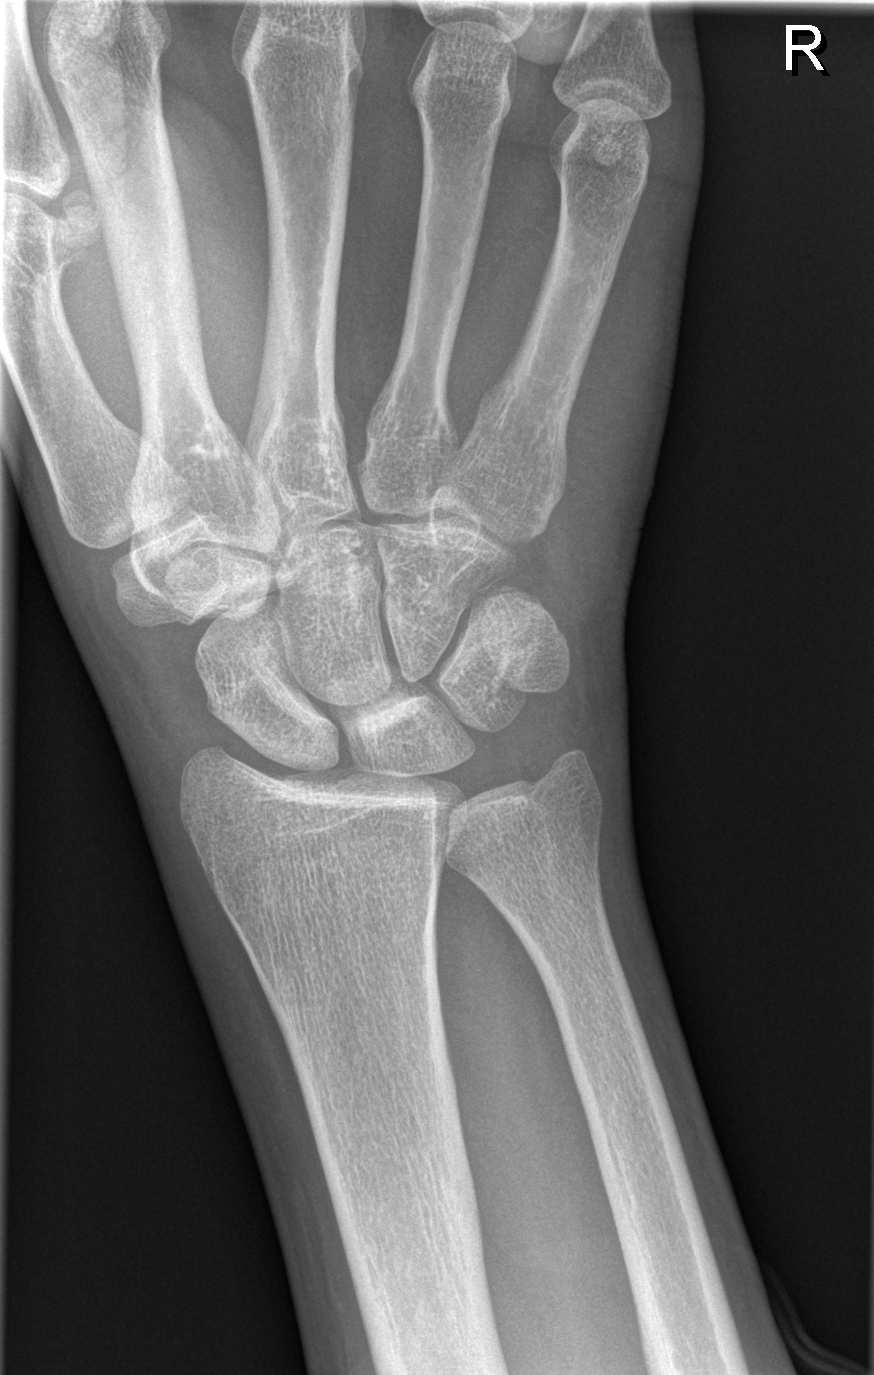

[x wrist obl right]
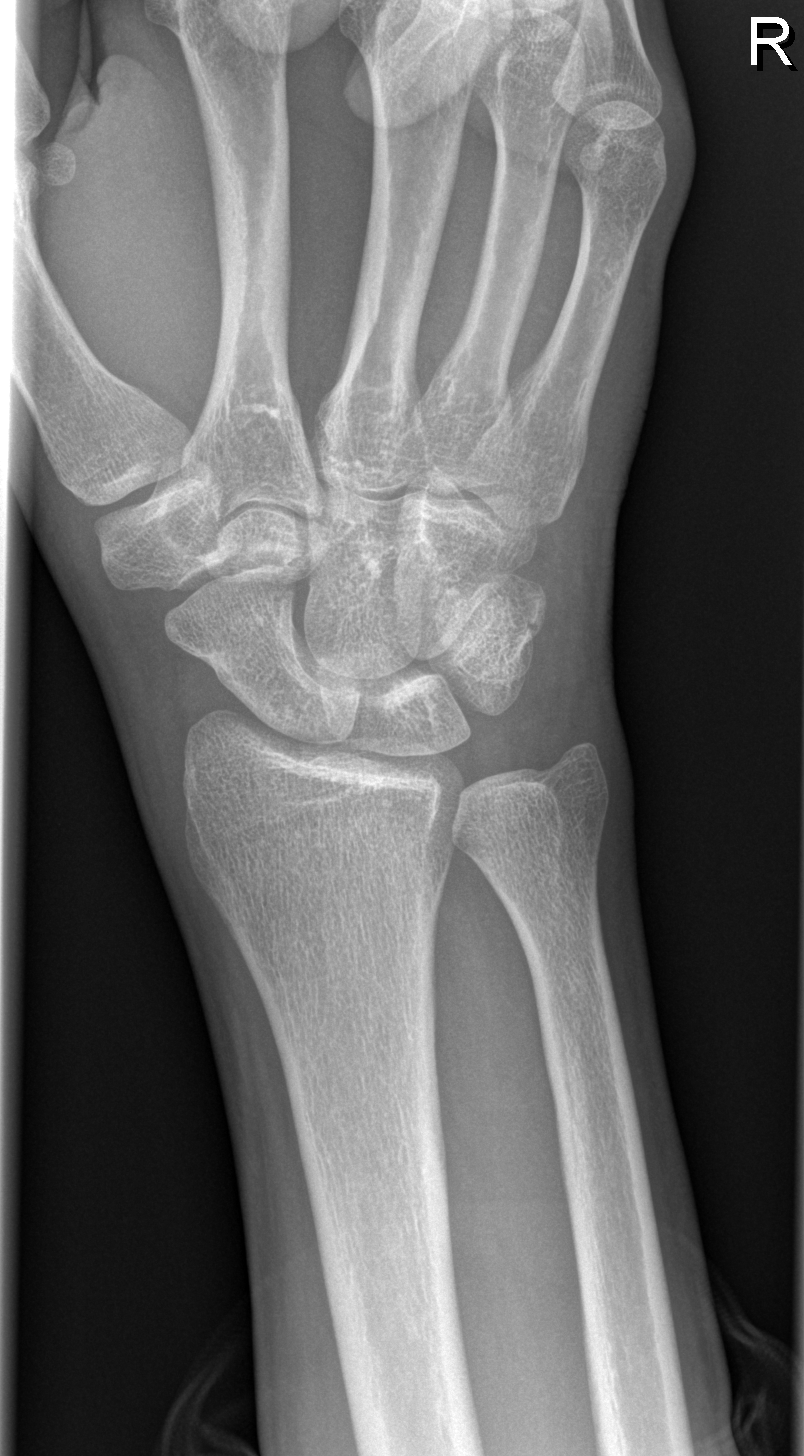

[x wrist lat right]
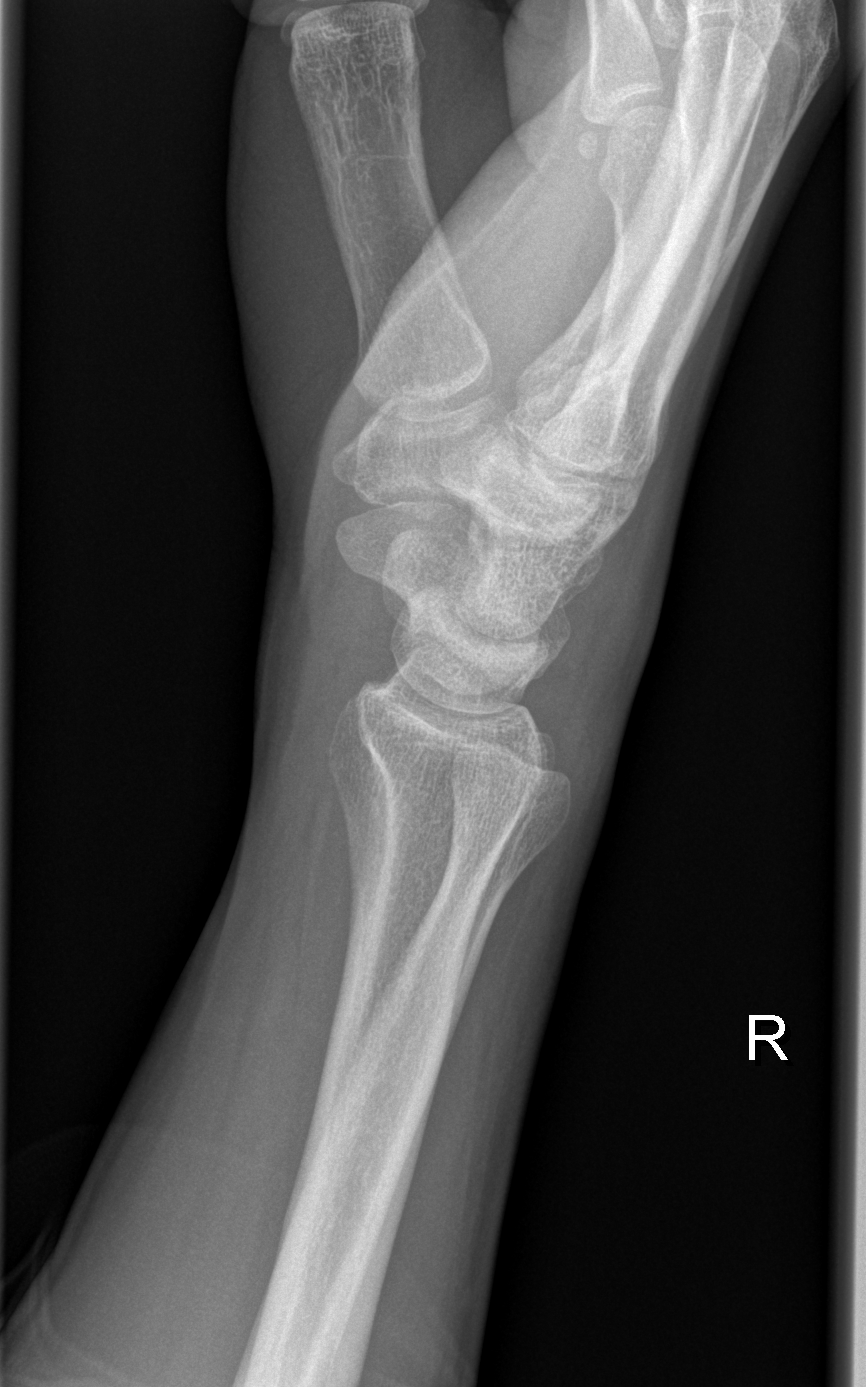

[x navicular]
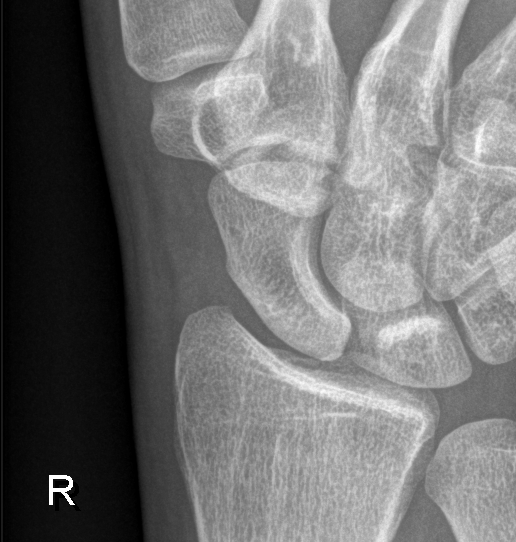

[4 of 4 positions shown; findings below may reference images not displayed]

FINDINGS: There is no acute fracture or dislocation. The bones are well
mineralized. There is mild soft tissue swelling over the dorsum of
the wrist. No radiopaque foreign object or soft tissue gas.
IMPRESSION: No acute fracture.

## 2018-09-26 ENCOUNTER — Telehealth: Payer: Self-pay

## 2018-09-26 DIAGNOSIS — D571 Sickle-cell disease without crisis: Secondary | ICD-10-CM

## 2018-09-26 MED ORDER — IBUPROFEN 800 MG PO TABS: 800.0000 mg | ORAL_TABLET | Freq: Three times a day (TID) | ORAL | 1 refills | Status: DC | PRN

## 2018-09-26 NOTE — Telephone Encounter (Signed)
Refill request for pain med for sickle cell disease

## 2018-09-27 MED ORDER — OXYCODONE HCL 20 MG PO TABS: 20.0000 mg | ORAL_TABLET | Freq: Four times a day (QID) | ORAL | 0 refills | Status: AC | PRN

## 2018-09-27 NOTE — Addendum Note (Signed)
Addended by: Genelle Bal on: 09/27/2018 04:54 PM   Modules accepted: Orders

## 2018-10-27 ENCOUNTER — Other Ambulatory Visit: Payer: Self-pay

## 2018-10-27 ENCOUNTER — Encounter: Payer: Self-pay | Admitting: Family Medicine

## 2018-10-27 ENCOUNTER — Ambulatory Visit (INDEPENDENT_AMBULATORY_CARE_PROVIDER_SITE_OTHER): Payer: Self-pay | Admitting: Family Medicine

## 2018-10-27 VITALS — BP 119/83 | HR 65 | Temp 98.5°F | Resp 16 | Ht 72.0 in | Wt 171.0 lb

## 2018-10-27 DIAGNOSIS — D571 Sickle-cell disease without crisis: Secondary | ICD-10-CM

## 2018-10-27 LAB — POCT URINALYSIS DIPSTICK
Blood, UA: NEGATIVE
Glucose, UA: NEGATIVE
Ketones, UA: NEGATIVE
Nitrite, UA: NEGATIVE
Protein, UA: NEGATIVE
Spec Grav, UA: 1.02 (ref 1.010–1.025)
Urobilinogen, UA: 0.2 E.U./dL
pH, UA: 6 (ref 5.0–8.0)

## 2018-10-27 MED ORDER — OXYCODONE HCL 20 MG PO TABS: 20.0000 mg | ORAL_TABLET | Freq: Four times a day (QID) | ORAL | 0 refills | Status: DC | PRN

## 2018-10-27 NOTE — Progress Notes (Signed)
[facility]  SICKLE CELL ANEMIA FOLLOW UP VISIT PROVIDER: Lanae Boast, FNP    Subjective:   Paul Campos  is a 28 y.o.  male who  has a past medical history of Sickle cell anemia (HCC) and Sickle cell anemia ([place]). presents for a follow up for Sickle Cell Anemia. The patient has had 0 admissions in the past 6 months.  Pain regimen includes: Ibuprofen and oxycodone 20 mg Q6 H PRN.  Hydrea Therapy: No Medication compliance: Yes    Review of Systems  Constitutional: Negative.   HENT: Negative.   Eyes: Negative.   Respiratory: Negative.   Cardiovascular: Negative.   Gastrointestinal: Negative.   Genitourinary: Negative.   Musculoskeletal: Negative.   Skin: Negative.   Neurological: Negative.   Psychiatric/Behavioral: Negative.     Objective:   Objective  BP 119/83 (BP Location: Left Arm, Patient Position: Sitting, Cuff Size: Normal)   Pulse 65   Temp 98.5 F (36.9 C) (Oral)   Resp 16   Ht 6' (1.829 m)   Wt 171 lb (77.6 kg)   SpO2 100%   BMI 23.19 kg/m   Wt Readings from Last 3 Encounters:  10/27/18 171 lb (77.6 kg)  04/26/18 186 lb 3.2 oz (84.5 kg)  02/21/18 187 lb (84.8 kg)     Physical Exam Vitals signs and nursing note reviewed.  Constitutional:      General: He is not in acute distress.    Appearance: He is well-developed.  HENT:     Head: Normocephalic and atraumatic.  Eyes:     Conjunctiva/sclera: Conjunctivae normal.     Pupils: Pupils are equal, round, and reactive to light.  Neck:     Musculoskeletal: Normal range of motion.  Cardiovascular:     Rate and Rhythm: Normal rate and regular rhythm.     Heart sounds: Normal heart sounds.  Pulmonary:     Effort: Pulmonary effort is normal. No respiratory distress.     Breath sounds: Normal breath sounds.  Musculoskeletal: Normal range of motion.  Skin:    General: Skin is warm and dry.  Neurological:     Mental Status: He is alert and oriented to  person, place, and time.  Psychiatric:        Mood and Affect: Mood normal.        Behavior: Behavior normal.        Thought Content: Thought content normal.        Judgment: Judgment normal.      Assessment/Plan:   Assessment   Encounter Diagnosis  Name Primary?  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  Dispense: 60 tablet; Refill: 0 - CBC with Differential - Comprehensive metabolic panel   Return to care as scheduled and prn. Patient verbalized understanding and agreed with plan of care.   1. Sickle cell disease -   We discussed the need for good hydration, monitoring of hydration status, avoidance of heat, cold, stress, and infection triggers. We discussed the risks and benefits of Hydrea, including bone marrow suppression, the possibility of GI upset, skin ulcers, hair thinning, and teratogenicity. The patient was reminded of the need to seek medical attention of any symptoms of bleeding, anemia, or infection. Continue folic acid  1 mg daily to prevent aplastic bone marrow crises.   2. Pulmonary evaluation - Patient denies severe recurrent wheezes, shortness of breath with exercise, or persistent cough. If these symptoms develop, pulmonary function tests with spirometry will be ordered, and if abnormal, plan on referral to Pulmonology for further evaluation.  3. Cardiac - Routine screening for pulmonary hypertension is not recommended.  4. Eye - High risk of proliferative retinopathy. Annual eye exam with retinal exam recommended to patient.  5. Immunization status -  Yearly influenza vaccination is recommended, as well as being up to date with Meningococcal and Pneumococcal vaccines.   6. Acute and chronic painful episodes - We discussed that pt is to receive Schedule II prescriptions only  from us. Pt is also aware that the prescription history is available to us online through the [facility] CS[facility]. Controlled substance agreement signed. We reminded [name] that all patients receiving Schedule II narcotics must be seen for follow within one month of prescription being requested. We reviewed the terms of our pain agreement, including the need to keep medicines in a safe locked location away from children or pets, and the need to report excess sedation or constipation, measures to avoid constipation, and policies related to early refills and stolen prescriptions. According to the [place] Chronic Pain Initiative program, we have reviewed details related to analgesia, adverse effects, aberrant behaviors.  7. Iron overload from chronic transfusion.  Not applicable at this time.  If this occurs will use Exjade for management.   8. Vitamin D deficiency - Drisdol 50,000 units weekly. Patient encouraged to take as prescribed.   The above recommendations are taken from the NIH Evidence-Based Management of Sickle Cell Disease: Expert Panel Report, [id number].   Ms. [name], FNP-BC [facility] [address]  [place], [place] [id number] [phone]  This note has been created with Dragon speech recognition software and smart phrase technology. Any transcriptional errors are unintentional.

## 2018-10-27 NOTE — Patient Instructions (Signed)
It has been my pleasure taking part in your health care. I wish you all the best!  Sickle Cell Anemia, Adult  Sickle cell anemia is a condition where your red blood cells are shaped like sickles. Red blood cells carry oxygen through the body. Sickle-shaped cells do not live as long as normal red blood cells. They also clump together and block blood from flowing through the blood vessels. This prevents the body from getting enough oxygen. Sickle cell anemia causes organ damage and pain. It also increases the risk of infection. Follow these instructions at home: Medicines  Take over-the-counter and prescription medicines only as told by your doctor.  If you were prescribed an antibiotic medicine, take it as told by your doctor. Do not stop taking the antibiotic even if you start to feel better.  If you develop a fever, do not take medicines to lower the fever right away. Tell your doctor about the fever. Managing pain, stiffness, and swelling  Try these methods to help with pain: ? Use a heating pad. ? Take a warm bath. ? Distract yourself, such as by watching TV. Eating and drinking  Drink enough fluid to keep your pee (urine) clear or pale yellow. Drink more in hot weather and during exercise.  Limit or avoid alcohol.  Eat a healthy diet. Eat plenty of fruits, vegetables, whole grains, and lean protein.  Take vitamins and supplements as told by your doctor. Traveling  When traveling, keep these with you: ? Your medical information. ? The names of your doctors. ? Your medicines.  If you need to take an airplane, talk to your doctor first. Activity  Rest often.  Avoid exercises that make your heart beat much faster, such as jogging. General instructions  Do not use products that have nicotine or tobacco, such as cigarettes and e-cigarettes. If you need help quitting, ask your doctor.  Consider wearing a medical alert bracelet.  Avoid being in high places (high altitudes),  such as mountains.  Avoid very hot or cold temperatures.  Avoid places where the temperature changes a lot.  Keep all follow-up visits as told by your doctor. This is important. Contact a doctor if:  A joint hurts.  Your feet or hands hurt or swell.  You feel tired (fatigued). Get help right away if:  You have symptoms of infection. These include: ? Fever. ? Chills. ? Being very tired. ? Irritability. ? Poor eating. ? Throwing up (vomiting).  You feel dizzy or faint.  You have new stomach pain, especially on the left side.  You have a an erection (priapism) that lasts more than 4 hours.  You have numbness in your arms or legs.  You have a hard time moving your arms or legs.  You have trouble talking.  You have pain that does not go away when you take medicine.  You are short of breath.  You are breathing fast.  You have a long-term cough.  You have pain in your chest.  You have a bad headache.  You have a stiff neck.  Your stomach looks bloated even though you did not eat much.  Your skin is pale.  You suddenly cannot see well. Summary  Sickle cell anemia is a condition where your red blood cells are shaped like sickles.  Follow your doctor's advice on ways to manage pain, food to eat, activities to do, and steps to take for safe travel.  Get medical help right away if you have any signs  of infection, such as a fever. This information is not intended to replace advice given to you by your health care provider. Make sure you discuss any questions you have with your health care provider. Document Released: 11/29/2012 Document Revised: 06/02/2018 Document Reviewed: 03/16/2016 Elsevier Patient Education  2020 [employer].

## 2018-10-28 LAB — COMPREHENSIVE METABOLIC PANEL
ALT: 10 IU/L (ref 0–44)
AST: 15 IU/L (ref 0–40)
Albumin/Globulin Ratio: 1.7 (ref 1.2–2.2)
Albumin: 4.3 g/dL (ref 4.1–5.2)
Alkaline Phosphatase: 55 IU/L (ref 39–117)
BUN/Creatinine Ratio: 5 — ABNORMAL LOW (ref 9–20)
BUN: 5 mg/dL — ABNORMAL LOW (ref 6–20)
Bilirubin Total: 1 mg/dL (ref 0.0–1.2)
CO2: 20 mmol/L (ref 20–29)
Calcium: 9 mg/dL (ref 8.7–10.2)
Chloride: 106 mmol/L (ref 96–106)
Creatinine, Ser: 0.97 mg/dL (ref 0.76–1.27)
GFR calc Af Amer: 124 mL/min/{1.73_m2} (ref >59)
GFR calc non Af Amer: 107 mL/min/{1.73_m2} (ref >59)
Globulin, Total: 2.5 g/dL (ref 1.5–4.5)
Glucose: 102 mg/dL — ABNORMAL HIGH (ref 65–99)
Potassium: 3.6 mmol/L (ref 3.5–5.2)
Sodium: 141 mmol/L (ref 134–144)
Total Protein: 6.8 g/dL (ref 6.0–8.5)

## 2018-10-28 LAB — CBC WITH DIFFERENTIAL/PLATELET
Basophils Absolute: 0 10*3/uL (ref 0.0–0.2)
Basos: 1 %
EOS (ABSOLUTE): 0.3 10*3/uL (ref 0.0–0.4)
Eos: 5 %
Hematocrit: 37.6 % (ref 37.5–51.0)
Hemoglobin: 12.3 g/dL — ABNORMAL LOW (ref 13.0–17.7)
Immature Grans (Abs): 0 10*3/uL (ref 0.0–0.1)
Immature Granulocytes: 0 %
Lymphocytes Absolute: 1.7 10*3/uL (ref 0.7–3.1)
Lymphs: 33 %
MCH: 25.1 pg — ABNORMAL LOW (ref 26.6–33.0)
MCHC: 32.7 g/dL (ref 31.5–35.7)
MCV: 77 fL — ABNORMAL LOW (ref 79–97)
Monocytes Absolute: 0.6 10*3/uL (ref 0.1–0.9)
Monocytes: 12 %
Neutrophils Absolute: 2.7 10*3/uL (ref 1.4–7.0)
Neutrophils: 49 %
Platelets: 146 10*3/uL — ABNORMAL LOW (ref 150–450)
RBC: 4.9 x10E6/uL (ref 4.14–5.80)
RDW: 16.4 % — ABNORMAL HIGH (ref 11.6–15.4)
WBC: 5.3 10*3/uL (ref 3.4–10.8)

## 2018-11-01 ENCOUNTER — Encounter (HOSPITAL_COMMUNITY): Payer: Self-pay | Admitting: *Deleted

## 2018-11-01 ENCOUNTER — Encounter (HOSPITAL_COMMUNITY): Payer: Self-pay

## 2018-11-04 ENCOUNTER — Encounter (HOSPITAL_BASED_OUTPATIENT_CLINIC_OR_DEPARTMENT_OTHER): Payer: Self-pay | Admitting: Emergency Medicine

## 2018-11-04 ENCOUNTER — Inpatient Hospital Stay (HOSPITAL_BASED_OUTPATIENT_CLINIC_OR_DEPARTMENT_OTHER)
Admission: EM | Admit: 2018-11-04 | Discharge: 2018-11-07 | DRG: 812 | Disposition: A | Discharge status: Home / Self Care | Payer: Self-pay | Attending: Internal Medicine | Admitting: Internal Medicine

## 2018-11-04 ENCOUNTER — Other Ambulatory Visit: Payer: Self-pay

## 2018-11-04 DIAGNOSIS — D72829 Elevated white blood cell count, unspecified: Secondary | ICD-10-CM | POA: Diagnosis not present

## 2018-11-04 DIAGNOSIS — Z20828 Contact with and (suspected) exposure to other viral communicable diseases: Secondary | ICD-10-CM | POA: Diagnosis present

## 2018-11-04 DIAGNOSIS — R112 Nausea with vomiting, unspecified: Secondary | ICD-10-CM | POA: Diagnosis present

## 2018-11-04 DIAGNOSIS — E876 Hypokalemia: Secondary | ICD-10-CM | POA: Diagnosis present

## 2018-11-04 DIAGNOSIS — D696 Thrombocytopenia, unspecified: Secondary | ICD-10-CM | POA: Diagnosis present

## 2018-11-04 DIAGNOSIS — D638 Anemia in other chronic diseases classified elsewhere: Secondary | ICD-10-CM | POA: Diagnosis present

## 2018-11-04 DIAGNOSIS — Z801 Family history of malignant neoplasm of trachea, bronchus and lung: Secondary | ICD-10-CM

## 2018-11-04 DIAGNOSIS — R1112 Projectile vomiting: Secondary | ICD-10-CM

## 2018-11-04 DIAGNOSIS — D5700 Hb-SS disease with crisis, unspecified: Principal | ICD-10-CM | POA: Diagnosis present

## 2018-11-04 DIAGNOSIS — Z885 Allergy status to narcotic agent status: Secondary | ICD-10-CM

## 2018-11-04 DIAGNOSIS — Z881 Allergy status to other antibiotic agents status: Secondary | ICD-10-CM

## 2018-11-04 DIAGNOSIS — G894 Chronic pain syndrome: Secondary | ICD-10-CM | POA: Diagnosis present

## 2018-11-04 LAB — CBC
HCT: 32 % — ABNORMAL LOW (ref 39.0–52.0)
Hemoglobin: 10.9 g/dL — ABNORMAL LOW (ref 13.0–17.0)
MCH: 26.3 pg (ref 26.0–34.0)
MCHC: 34.1 g/dL (ref 30.0–36.0)
MCV: 77.1 fL — ABNORMAL LOW (ref 80.0–100.0)
Platelets: 47 10*3/uL — ABNORMAL LOW (ref 150–400)
RBC: 4.15 MIL/uL — ABNORMAL LOW (ref 4.22–5.81)
RDW: 15.4 % (ref 11.5–15.5)
WBC: 16.3 10*3/uL — ABNORMAL HIGH (ref 4.0–10.5)
nRBC: 0.6 % — ABNORMAL HIGH (ref 0.0–0.2)

## 2018-11-04 LAB — COMPREHENSIVE METABOLIC PANEL
ALT: 18 U/L (ref 0–44)
AST: 37 U/L (ref 15–41)
Albumin: 4.4 g/dL (ref 3.5–5.0)
Alkaline Phosphatase: 52 U/L (ref 38–126)
Anion gap: 19 — ABNORMAL HIGH (ref 5–15)
BUN: 5 mg/dL — ABNORMAL LOW (ref 6–20)
CO2: 18 mmol/L — ABNORMAL LOW (ref 22–32)
Calcium: 9 mg/dL (ref 8.9–10.3)
Chloride: 103 mmol/L (ref 98–111)
Creatinine, Ser: 1.01 mg/dL (ref 0.61–1.24)
GFR calc Af Amer: >60 mL/min (ref >60)
GFR calc non Af Amer: >60 mL/min (ref >60)
Glucose, Bld: 113 mg/dL — ABNORMAL HIGH (ref 70–99)
Potassium: 3.5 mmol/L (ref 3.5–5.1)
Sodium: 140 mmol/L (ref 135–145)
Total Bilirubin: 1.5 mg/dL — ABNORMAL HIGH (ref 0.3–1.2)
Total Protein: 7.5 g/dL (ref 6.5–8.1)

## 2018-11-04 LAB — CBC WITH DIFFERENTIAL/PLATELET
Abs Immature Granulocytes: 0.34 10*3/uL — ABNORMAL HIGH (ref 0.00–0.07)
Basophils Absolute: 0 10*3/uL (ref 0.0–0.1)
Basophils Relative: 1 %
Eosinophils Absolute: 0.2 10*3/uL (ref 0.0–0.5)
Eosinophils Relative: 3 %
HCT: 37.7 % — ABNORMAL LOW (ref 39.0–52.0)
Hemoglobin: 12.9 g/dL — ABNORMAL LOW (ref 13.0–17.0)
Immature Granulocytes: 5 %
Lymphocytes Relative: 38 %
Lymphs Abs: 2.8 10*3/uL (ref 0.7–4.0)
MCH: 25.5 pg — ABNORMAL LOW (ref 26.0–34.0)
MCHC: 34.2 g/dL (ref 30.0–36.0)
MCV: 74.7 fL — ABNORMAL LOW (ref 80.0–100.0)
Monocytes Absolute: 0.6 10*3/uL (ref 0.1–1.0)
Monocytes Relative: 8 %
Neutro Abs: 3.3 10*3/uL (ref 1.7–7.7)
Neutrophils Relative %: 45 %
Platelets: 133 10*3/uL — ABNORMAL LOW (ref 150–400)
RBC: 5.05 MIL/uL (ref 4.22–5.81)
RDW: 15.3 % (ref 11.5–15.5)
Smear Review: NORMAL
WBC Morphology: ABNORMAL
WBC: 7.2 10*3/uL (ref 4.0–10.5)
nRBC: 0.4 % — ABNORMAL HIGH (ref 0.0–0.2)

## 2018-11-04 LAB — CREATININE, SERUM
Creatinine, Ser: 0.94 mg/dL (ref 0.61–1.24)
GFR calc Af Amer: >60 mL/min (ref >60)
GFR calc non Af Amer: >60 mL/min (ref >60)

## 2018-11-04 LAB — RETICULOCYTES
Immature Retic Fract: 29.1 % — ABNORMAL HIGH (ref 2.3–15.9)
RBC.: 5.09 MIL/uL (ref 4.22–5.81)
Retic Count, Absolute: 168 10*3/uL (ref 19.0–186.0)
Retic Ct Pct: 3.3 % — ABNORMAL HIGH (ref 0.4–3.1)

## 2018-11-04 LAB — SARS CORONAVIRUS 2 BY RT PCR (HOSPITAL ORDER, PERFORMED IN ~~LOC~~ HOSPITAL LAB): SARS Coronavirus 2: NEGATIVE

## 2018-11-04 MED ORDER — DEXTROSE-NACL 5-0.45 % IV SOLN
INTRAVENOUS | Status: DC
  Administered 2018-11-04 – 2018-11-07 (×4): via INTRAVENOUS

## 2018-11-04 MED ORDER — MORPHINE SULFATE (PF) 4 MG/ML IV SOLN
4.0000 mg | Freq: Once | INTRAVENOUS | Status: AC
  Administered 2018-11-04: 4 mg via INTRAVENOUS
  Filled 2018-11-04: qty 1

## 2018-11-04 MED ORDER — ONDANSETRON HCL 4 MG/2ML IJ SOLN
4.0000 mg | Freq: Four times a day (QID) | INTRAMUSCULAR | Status: DC | PRN
  Administered 2018-11-04 (×2): 4 mg via INTRAVENOUS
  Filled 2018-11-04: qty 2

## 2018-11-04 MED ORDER — DIPHENHYDRAMINE HCL 50 MG/ML IJ SOLN
25.0000 mg | Freq: Once | INTRAMUSCULAR | Status: AC
  Administered 2018-11-04: 08:00:00 25 mg via INTRAVENOUS
  Filled 2018-11-04: qty 1

## 2018-11-04 MED ORDER — SENNOSIDES-DOCUSATE SODIUM 8.6-50 MG PO TABS
1.0000 | ORAL_TABLET | Freq: Two times a day (BID) | ORAL | Status: DC
  Administered 2018-11-05 – 2018-11-07 (×5): 1 via ORAL
  Filled 2018-11-04 (×5): qty 1

## 2018-11-04 MED ORDER — SODIUM CHLORIDE 0.9% FLUSH
3.0000 mL | Freq: Once | INTRAVENOUS | Status: DC
  Filled 2018-11-04: qty 3

## 2018-11-04 MED ORDER — MORPHINE SULFATE 2 MG/ML IV SOLN
INTRAVENOUS | Status: DC
  Administered 2018-11-04: 25 mg via INTRAVENOUS
  Filled 2018-11-04: qty 30

## 2018-11-04 MED ORDER — ONDANSETRON HCL 4 MG/2ML IJ SOLN
INTRAMUSCULAR | Status: AC
  Filled 2018-11-04: qty 2

## 2018-11-04 MED ORDER — MORPHINE SULFATE 2 MG/ML IV SOLN
INTRAVENOUS | Status: DC
  Administered 2018-11-04: 28 mg via INTRAVENOUS
  Administered 2018-11-04 (×2): 4 mg via INTRAVENOUS
  Administered 2018-11-05: 11:00:00 via INTRAVENOUS
  Administered 2018-11-05 (×2): 4 mg via INTRAVENOUS
  Filled 2018-11-04 (×2): qty 30

## 2018-11-04 MED ORDER — KETOROLAC TROMETHAMINE 30 MG/ML IJ SOLN
30.0000 mg | Freq: Four times a day (QID) | INTRAMUSCULAR | Status: DC
  Administered 2018-11-04: 30 mg via INTRAVENOUS
  Filled 2018-11-04: qty 1

## 2018-11-04 MED ORDER — DIPHENHYDRAMINE HCL 12.5 MG/5ML PO ELIX: 12.5000 mg | ORAL_SOLUTION | Freq: Four times a day (QID) | ORAL | Status: DC | PRN

## 2018-11-04 MED ORDER — SODIUM CHLORIDE 0.9 % IV BOLUS
1000.0000 mL | Freq: Once | INTRAVENOUS | Status: AC
  Administered 2018-11-04: 04:00:00 1000 mL via INTRAVENOUS

## 2018-11-04 MED ORDER — PANTOPRAZOLE SODIUM 40 MG IV SOLR
40.0000 mg | Freq: Two times a day (BID) | INTRAVENOUS | Status: DC
  Administered 2018-11-04 – 2018-11-07 (×7): 40 mg via INTRAVENOUS
  Filled 2018-11-04 (×7): qty 40

## 2018-11-04 MED ORDER — MORPHINE SULFATE (PF) 4 MG/ML IV SOLN
8.0000 mg | INTRAVENOUS | Status: DC | PRN
  Administered 2018-11-04: 8 mg via INTRAVENOUS
  Filled 2018-11-04 (×2): qty 2

## 2018-11-04 MED ORDER — MORPHINE SULFATE (PF) 4 MG/ML IV SOLN
8.0000 mg | Freq: Once | INTRAVENOUS | Status: AC
  Administered 2018-11-04: 8 mg via INTRAVENOUS
  Filled 2018-11-04: qty 2

## 2018-11-04 MED ORDER — SODIUM CHLORIDE 0.9% FLUSH: 9.0000 mL | INTRAVENOUS | Status: DC | PRN

## 2018-11-04 MED ORDER — NALOXONE HCL 0.4 MG/ML IJ SOLN: 0.4000 mg | INTRAMUSCULAR | Status: DC | PRN

## 2018-11-04 MED ORDER — ONDANSETRON HCL 4 MG/2ML IJ SOLN
4.0000 mg | Freq: Once | INTRAMUSCULAR | Status: AC
  Administered 2018-11-04: 4 mg via INTRAVENOUS
  Filled 2018-11-04: qty 2

## 2018-11-04 MED ORDER — DIPHENHYDRAMINE HCL 50 MG/ML IJ SOLN: 12.5000 mg | Freq: Four times a day (QID) | INTRAMUSCULAR | Status: DC | PRN

## 2018-11-04 MED ORDER — ONDANSETRON HCL 4 MG/2ML IJ SOLN
4.0000 mg | Freq: Once | INTRAMUSCULAR | Status: AC
  Administered 2018-11-04: 4 mg via INTRAVENOUS

## 2018-11-04 MED ORDER — ENOXAPARIN SODIUM 40 MG/0.4ML ~~LOC~~ SOLN
40.0000 mg | Freq: Every day | SUBCUTANEOUS | Status: DC
  Filled 2018-11-04: qty 0.4

## 2018-11-04 MED ORDER — ONDANSETRON HCL 4 MG/2ML IJ SOLN: 4.0000 mg | Freq: Four times a day (QID) | INTRAMUSCULAR | Status: DC | PRN

## 2018-11-04 MED ORDER — POLYETHYLENE GLYCOL 3350 17 G PO PACK: 17.0000 g | PACK | Freq: Every day | ORAL | Status: DC | PRN

## 2018-11-04 MED ORDER — MORPHINE SULFATE (PF) 4 MG/ML IV SOLN
8.0000 mg | Freq: Once | INTRAVENOUS | Status: AC
  Administered 2018-11-04: 07:00:00 8 mg via INTRAVENOUS
  Filled 2018-11-04: qty 2

## 2018-11-04 NOTE — ED Notes (Signed)
Hospitalist consult called via [facility], spoke with Cassie 

## 2018-11-04 NOTE — Progress Notes (Addendum)
Pt received in room 1510. Alert and oriented with c/o severe pain and nausea. Dr. Jonelle Sidle notified of nausea. One time order given for 4 mg of zofran. PCA set up. Max limit exceeded allowed amount(18 mg )for drug. Pharmacy notified and max limit of 18 mg/ml confirmed. Pump programmed to allowed amount.

## 2018-11-04 NOTE — ED Notes (Signed)
ED Provider at bedside. 

## 2018-11-04 NOTE — ED Provider Notes (Signed)
Signout from Dr. Florina Ou.  28 year old male with sickle cell disease here with sickle cell pain crisis with 10 out of 10 pain in his back. Physical Exam  BP 124/87   Pulse 81   Temp 98.5 F (36.9 C) (Oral)   Resp 15   Ht 6' (1.829 m)   Wt 81.6 kg   SpO2 97%   BMI 24.41 kg/m   Physical Exam  ED Course/Procedures     Procedures  MDM  He is received 3 doses of morphine and plan is for 1 more dose and if he is not improved enough for discharge he will likely need to be admitted.  4a - Patient received his fourth dose about 1/2-hour ago.  He is still writhing around extreme pain.  He said he feels he will need to be admitted.  He is asking for some Benadryl.  As far as what to give him for pain he says the morphine is really the only thing that we can give him.  808a - Discussed with Dr Jonelle Sidle from Hospitalist service who accepts patient for admission.    Paul Rasmussen, MD 11/04/18 1640

## 2018-11-04 NOTE — ED Notes (Signed)
Pt given gatorade  

## 2018-11-04 NOTE — ED Provider Notes (Signed)
[facility] Provider Note: Lowella DellJ. Lane Loghan Subia, MD, FACEP  CSN: [id number] MRN: [id number] ARRIVAL: 11/04/18 at 0331 ROOM: [facility]05/[facility]05   CHIEF COMPLAINT  Sickle Cell Pain Crisis   HISTORY OF PRESENT ILLNESS  11/04/18 5:26 AM Paul Campos is a 28 y.o. male who complains of back pain that began about an hour prior to arrival.  The back pain is diffuse.  The is typical of his sickle cell pain but in a different location; he usually has pain in his extremities.Marland Kitchen.  He rates his pain as a 10 out of 10, not relieved with his usual home medications.  He denies fever, chest pain, shortness of breath, vomiting or diarrhea.  He was given 4 mg of IV morphine prior to my evaluation without adequate relief of his pain.   Past Medical History:  Diagnosis Date  . Sickle cell anemia (HCC)     Past Surgical History:  Procedure Laterality Date  . APPENDECTOMY      Family History  Problem Relation Age of Onset  . Lung cancer Mother     Social History   Tobacco Use  . Smoking status: Never Smoker  . Smokeless tobacco: Never Used  Substance Use Topics  . Alcohol use: No  . Drug use: No    Prior to Admission medications   Medication Sig Start Date End Date Taking? Authorizing Provider  folic acid (FOLVITE) 1 MG tablet Take 1 tablet (1 mg total) by mouth daily. 07/27/18   Mike Gipouglas, Andre, FNP  ibuprofen (ADVIL) 800 MG tablet Take 1 tablet (800 mg total) by mouth every 8 (eight) hours as needed. 09/26/18   Mike Gipouglas, Andre, FNP  Oxycodone HCl 20 MG TABS Take 1 tablet (20 mg total) by mouth every 6 (six) hours as needed for up to 15 days. 10/27/18 11/11/18  Mike Gipouglas, Andre, FNP    Allergies Dilaudid [hydromorphone hcl], Hydromorphone, and Cefotaxime   REVIEW OF SYSTEMS  Negative except as noted here or in the History of Present Illness.   PHYSICAL EXAMINATION  Initial Vital Signs Blood pressure 119/79, pulse 74, temperature 98.5 F (36.9 C), temperature source Oral, resp. rate (!) 21,  height 6' (1.829 m), weight 81.6 kg, SpO2 97 %.  Examination General: Well-developed, well-nourished male in no acute distress; appearance consistent with age of record HENT: normocephalic; atraumatic Eyes: pupils equal, round and reactive to light; extraocular muscles intact Neck: supple Heart: regular rate and rhythm Lungs: clear to auscultation bilaterally Abdomen: soft; nondistended; nontender; no masses or hepatosplenomegaly; bowel sounds present Extremities: No deformity; full range of motion; pulses normal Neurologic: Awake, alert and oriented; motor function intact in all extremities and symmetric; no facial droop Skin: Warm and dry Psychiatric: Grimacing   RESULTS  Summary of this visit's results, reviewed by myself:   EKG Interpretation  Date/Time:    Ventricular Rate:    PR Interval:    QRS Duration:   QT Interval:    QTC Calculation:   R Axis:     Text Interpretation:        Laboratory Studies: Results for orders placed or performed during the hospital encounter of 11/04/18 (from the past 24 hour(s))  Comprehensive metabolic panel     Status: Abnormal   Collection Time: 11/04/18  3:59 AM  Result Value Ref Range   Sodium 140 135 - 145 mmol/L   Potassium 3.5 3.5 - 5.1 mmol/L   Chloride 103 98 - 111 mmol/L   CO2 18 (L) 22 - 32 mmol/L  Glucose, Bld 113 (H) 70 - 99 mg/dL   BUN 5 (L) 6 - 20 mg/dL   Creatinine, Ser 1.01 0.61 - 1.24 mg/dL   Calcium 9.0 8.9 - 10.3 mg/dL   Total Protein 7.5 6.5 - 8.1 g/dL   Albumin 4.4 3.5 - 5.0 g/dL   AST 37 15 - 41 U/L   ALT 18 0 - 44 U/L   Alkaline Phosphatase 52 38 - 126 U/L   Total Bilirubin 1.5 (H) 0.3 - 1.2 mg/dL   GFR calc non Af Amer >60 >60 mL/min   GFR calc Af Amer >60 >60 mL/min   Anion gap 19 (H) 5 - 15  CBC with Differential     Status: Abnormal   Collection Time: 11/04/18  3:59 AM  Result Value Ref Range   WBC 7.2 4.0 - 10.5 K/uL   RBC 5.05 4.22 - 5.81 MIL/uL   Hemoglobin 12.9 (L) 13.0 - 17.0 g/dL   HCT  37.7 (L) 39.0 - 52.0 %   MCV 74.7 (L) 80.0 - 100.0 fL   MCH 25.5 (L) 26.0 - 34.0 pg   MCHC 34.2 30.0 - 36.0 g/dL   RDW 15.3 11.5 - 15.5 %   Platelets 133 (L) 150 - 400 K/uL   nRBC 0.4 (H) 0.0 - 0.2 %   Neutrophils Relative % 45 %   Neutro Abs 3.3 1.7 - 7.7 K/uL   Lymphocytes Relative 38 %   Lymphs Abs 2.8 0.7 - 4.0 K/uL   Monocytes Relative 8 %   Monocytes Absolute 0.6 0.1 - 1.0 K/uL   Eosinophils Relative 3 %   Eosinophils Absolute 0.2 0.0 - 0.5 K/uL   Basophils Relative 1 %   Basophils Absolute 0.0 0.0 - 0.1 K/uL   WBC Morphology Abnormal lymphocytes present    Smear Review Normal platelet morphology    Immature Granulocytes 5 %   Abs Immature Granulocytes 0.34 (H) 0.00 - 0.07 K/uL   Schistocytes PRESENT    Tear Drop Cells PRESENT    Target Cells PRESENT   Reticulocytes     Status: Abnormal   Collection Time: 11/04/18  3:59 AM  Result Value Ref Range   Retic Ct Pct 3.3 (H) 0.4 - 3.1 %   RBC. 5.09 4.22 - 5.81 MIL/uL   Retic Count, Absolute 168.0 19.0 - 186.0 K/uL   Immature Retic Fract 29.1 (H) 2.3 - 15.9 %   Imaging Studies: No results found.  ED COURSE and MDM  Nursing notes and initial vitals signs, including pulse oximetry, reviewed.  Vitals:   11/04/18 0500 11/04/18 0530 11/04/18 0600 11/04/18 0630  BP: 119/79 129/86 125/85 124/87  Pulse: 74 92 80 81  Resp: (!) 21 (!) 23 17 15   Temp:      TempSrc:      SpO2: 97% 100% 98% 97%  Weight:      Height:       6:18 AM Pain down to 7 out of 10 after additional 8 mg of morphine IV.   PROCEDURES    ED DIAGNOSES     ICD-10-CM   1. Sickle cell pain crisis ([place])  D57.00        Onedia Vargus, Jenny Reichmann, MD 11/04/18 [phone]

## 2018-11-04 NOTE — ED Triage Notes (Signed)
Pt is c/o back pain that started an hour or so ago  Pt states his whole back hurts  States this is like his normal sickle cell crisis pain

## 2018-11-04 NOTE — ED Notes (Signed)
Pt vomited large amount of liquid emesis

## 2018-11-04 NOTE — H&P (Signed)
History and Physical   Paul Campos VPX:[id number] DOB: 1990-10-07 DOA: 11/04/2018  Referring MD/NP/PA: Dr. Laverta Baltimore  PCP: Lanae Boast, [place]   Outpatient Specialists: None  Patient coming from: [facility] [place]  Chief Complaint: Pain in back and legs  HPI: Paul Campos is a 28 y.o. male with medical history significant of sickle cell disease who presented to [facility] with progressive pain in his back and legs which is rated as 10 out of 10.  Pain is similar to his typical sickle cell crisis.  Not relieved by his home regimen.  Patient was seen and evaluated.  Given IV morphine in the ER with no significant relief.  Patient is therefore being transferred for inpatient care.  He denied any fever or chills.  Denied any trauma.  Denied any exposure to COVID-19.  Patient is relatively opiate nave..  ED Course: Vitals were essentially stable.  Sodium 140 potassium 3.5 chloride 103 CO2 of 18 with glucose 113.  Creatinine is 1.01.  Total bilirubin is 1.5.  His white count is 7.2 hemoglobin 12.9 and platelets 133.  His reticulocyte count is 168 with percentage of 5.09.  COVID-19's screen is negative.  Patient has been admitted for acute sickle cell crisis that failed outpatient.  Review of Systems: As per HPI otherwise 10 point review of systems negative.    Past Medical History:  Diagnosis Date  . Sickle cell anemia (HCC)     Past Surgical History:  Procedure Laterality Date  . APPENDECTOMY       reports that he has never smoked. He has never used smokeless tobacco. He reports that he does not drink alcohol or use drugs.  Allergies  Allergen Reactions  . Dilaudid [Hydromorphone Hcl] Hives  . Hydromorphone Itching    Morphine okay.  . Cefotaxime Itching and Rash    Hives    Family History  Problem Relation Age of Onset  . Lung cancer Mother      Prior to Admission medications   Medication Sig Start Date End Date Taking? Authorizing Provider  folic acid  (FOLVITE) 1 MG tablet Take 1 tablet (1 mg total) by mouth daily. 07/27/18   Lanae Boast, FNP  ibuprofen (ADVIL) 800 MG tablet Take 1 tablet (800 mg total) by mouth every 8 (eight) hours as needed. 09/26/18   Lanae Boast, FNP  Oxycodone HCl 20 MG TABS Take 1 tablet (20 mg total) by mouth every 6 (six) hours as needed for up to 15 days. 10/27/18 11/11/18  Lanae Boast, FNP    Physical Exam: Vitals:   11/04/18 0600 11/04/18 0630 11/04/18 0700 11/04/18 0730  BP: 125/85 124/87 131/82 120/73  Pulse: 80 81 73 99  Resp: 17 15 12 20   Temp:      TempSrc:      SpO2: 98% 97% 96% 97%  Weight:      Height:          Constitutional: NAD, calm, comfortable Vitals:   11/04/18 0600 11/04/18 0630 11/04/18 0700 11/04/18 0730  BP: 125/85 124/87 131/82 120/73  Pulse: 80 81 73 99  Resp: 17 15 12 20   Temp:      TempSrc:      SpO2: 98% 97% 96% 97%  Weight:      Height:       Eyes: PERRL, lids and conjunctivae normal ENMT: Mucous membranes are moist. Posterior pharynx clear of any exudate or lesions.Normal dentition.  Neck: normal, supple, no masses, no thyromegaly Respiratory: clear to  auscultation bilaterally, no wheezing, no crackles. Normal respiratory effort. No accessory muscle use.  Cardiovascular: Regular rate and rhythm, no murmurs / rubs / gallops. No extremity edema. 2+ pedal pulses. No carotid bruits.  Abdomen: no tenderness, no masses palpated. No hepatosplenomegaly. Bowel sounds positive.  Musculoskeletal: no clubbing / cyanosis. No joint deformity upper and lower extremities. Good ROM, no contractures. Normal muscle tone.  Skin: no rashes, lesions, ulcers. No induration Neurologic: CN 2-12 grossly intact. Sensation intact, DTR normal. Strength 5/5 in all 4.  Psychiatric: Normal judgment and insight. Alert and oriented x 3. Normal mood.     Labs on Admission: I have personally reviewed following labs and imaging studies  CBC: Recent Labs  Lab 11/04/18 0359  WBC 7.2  NEUTROABS  3.3  HGB 12.9*  HCT 37.7*  MCV 74.7*  PLT 133*   Basic Metabolic Panel: Recent Labs  Lab 11/04/18 0359  NA 140  K 3.5  CL 103  CO2 18*  GLUCOSE 113*  BUN 5*  CREATININE 1.01  CALCIUM 9.0   GFR: Estimated Creatinine Clearance: 121.7 mL/min (by C-G formula based on SCr of 1.01 mg/dL). Liver Function Tests: Recent Labs  Lab 11/04/18 0359  AST 37  ALT 18  ALKPHOS 52  BILITOT 1.5*  PROT 7.5  ALBUMIN 4.4   No results for input(s): LIPASE, AMYLASE in the last 168 hours. No results for input(s): AMMONIA in the last 168 hours. Coagulation Profile: No results for input(s): INR, PROTIME in the last 168 hours. Cardiac Enzymes: No results for input(s): CKTOTAL, CKMB, CKMBINDEX, TROPONINI in the last 168 hours. BNP (last 3 results) No results for input(s): PROBNP in the last 8760 hours. HbA1C: No results for input(s): HGBA1C in the last 72 hours. CBG: No results for input(s): GLUCAP in the last 168 hours. Lipid Profile: No results for input(s): CHOL, HDL, LDLCALC, TRIG, CHOLHDL, LDLDIRECT in the last 72 hours. Thyroid Function Tests: No results for input(s): TSH, T4TOTAL, FREET4, T3FREE, THYROIDAB in the last 72 hours. Anemia Panel: Recent Labs    11/04/18 0359  RETICCTPCT 3.3*   Urine analysis:    Component Value Date/Time   COLORURINE AMBER (A) 08/19/2012 1024   APPEARANCEUR Clear 12/02/2017 0952   LABSPEC 1.020 08/19/2012 1024   PHURINE 6.5 08/19/2012 1024   GLUCOSEU Negative 12/02/2017 0952   HGBUR NEGATIVE 08/19/2012 1024   BILIRUBINUR small 10/27/2018 1026   BILIRUBINUR Negative 12/02/2017 0952   KETONESUR negative 04/26/2018 0941   KETONESUR NEGATIVE 08/19/2012 1024   PROTEINUR Negative 10/27/2018 1026   PROTEINUR Negative 12/02/2017 0952   PROTEINUR NEGATIVE 08/19/2012 1024   UROBILINOGEN 0.2 10/27/2018 1026   UROBILINOGEN 0.2 08/19/2012 1024   NITRITE neg 10/27/2018 1026   NITRITE Negative 12/02/2017 0952   NITRITE NEGATIVE 08/19/2012 1024    LEUKOCYTESUR Trace (A) 10/27/2018 1026   LEUKOCYTESUR 2+ (A) 12/02/2017 0952   Sepsis Labs: @LABRCNTIP (procalcitonin:4,lacticidven:4) )No results found for this or any previous visit (from the past 240 hour(s)).   Radiological Exams on Admission: No results found.    Assessment/Plan Active Problems:   Sickle cell anemia with crisis (HCC)     #1 sickle cell anemia with crisis: Patient being admitted for inpatient care.  Initiate morphine PCA.  Toradol initiated but patient had some thrombocytopenia so we are holding it.  Also IV fluid and supportive care.  #2 anemia of chronic disease: H&H currently normal.  Suspect patient dehydrated.  Hydrate and follow H&H closely  #3 nausea with vomiting: Patient reported nausea and  vomiting.  At this point supportive care mainly.  He is allergic to Dilaudid so we will use morphine.  Suspected vomiting red materials could be blood.  Monitor H&H and check stool guaiacs     DVT prophylaxis: SCD Code Status: Full code Family Communication: Father at bedside Disposition Plan: Home Consults called: None Admission status: Inpatient  Severity of Illness: The appropriate patient status for this patient is INPATIENT. Inpatient status is judged to be reasonable and necessary in order to provide the required intensity of service to ensure the patient's safety. The patient's presenting symptoms, physical exam findings, and initial radiographic and laboratory data in the context of their chronic comorbidities is felt to place them at high risk for further clinical deterioration. Furthermore, it is not anticipated that the patient will be medically stable for discharge from the hospital within 2 midnights of admission. The following factors support the patient status of inpatient.   " The patient's presenting symptoms include pain in the back. " The worrisome physical exam findings include tenderness all over. " The initial radiographic and laboratory  data are worrisome because of normal hemoglobin. " The chronic co-morbidities include sickle cell disease.   * I certify that at the point of admission it is my clinical judgment that the patient will require inpatient hospital care spanning beyond 2 midnights from the point of admission due to high intensity of service, high risk for further deterioration and high frequency of surveillance required.Lonia Blood MD [facility] Pager [phone]  If 7PM-7AM, please contact night-coverage [email or web address] Password [facility]  11/04/2018, 8:41 AM

## 2018-11-04 NOTE — Progress Notes (Signed)
Pt with maroon vomitus in trash can. Dr. Jonelle Sidle made aware. New orders given for protonix IV and to hold lovenox. Pharmacy notified. VWilliams. RN.

## 2018-11-04 NOTE — ED Notes (Signed)
ED TO INPATIENT HANDOFF REPORT  ED Nurse Name and Phone #: Ubaldo Glassing [phone]  S Name/Age/Gender Paul Campos 28 y.o. male Room/Bed: MH05/MH05  Code Status   Code Status: Prior  Home/SNF/Other Home Patient oriented to: self, place, time and situation Is this baseline? Yes   Triage Complete: Triage complete  Chief Complaint sickle cell crisis  Triage Note Pt is c/o back pain that started an hour or so ago  Pt states his whole back hurts  States this is like his normal sickle cell crisis pain     Allergies Allergies  Allergen Reactions  . Dilaudid [Hydromorphone Hcl] Hives  . Hydromorphone Itching    Morphine okay.  . Cefotaxime Itching and Rash    Hives    Level of Care/Admitting Diagnosis ED Disposition    ED Disposition Condition Comment   Admit  Hospital Area: [place] [[id number]]  Level of Care: Med-Surg [16]  Covid Evaluation: Asymptomatic Screening Protocol (No Symptoms)  Diagnosis: Sickle cell anemia with crisis [facility]) [[id number]]  Admitting Physician: Elwyn Reach [[id number]]  Attending Physician: Elwyn Reach [[id number]]  Estimated length of stay: past midnight tomorrow  Certification:: I certify this patient will need inpatient services for at least 2 midnights  PT Class (Do Not Modify): Inpatient [101]  PT Acc Code (Do Not Modify): Private [1]       B Medical/Surgery History Past Medical History:  Diagnosis Date  . Sickle cell anemia (HCC)    Past Surgical History:  Procedure Laterality Date  . APPENDECTOMY       A IV Location/Drains/Wounds Patient Lines/Drains/Airways Status   Active Line/Drains/Airways    Name:   Placement date:   Placement time:   Site:   Days:   Peripheral IV 11/04/18 Right;Posterior Forearm   11/04/18    0357    Forearm   less than 1          Intake/Output Last 24 hours No intake or output data in the 24 hours ending 11/04/18 0851  Labs/Imaging Results for orders placed or performed during the  hospital encounter of 11/04/18 (from the past 48 hour(s))  Comprehensive metabolic panel     Status: Abnormal   Collection Time: 11/04/18  3:59 AM  Result Value Ref Range   Sodium 140 135 - 145 mmol/L   Potassium 3.5 3.5 - 5.1 mmol/L    Comment: SLIGHT HEMOLYSIS   Chloride 103 98 - 111 mmol/L   CO2 18 (L) 22 - 32 mmol/L   Glucose, Bld 113 (H) 70 - 99 mg/dL   BUN 5 (L) 6 - 20 mg/dL   Creatinine, Ser 1.01 0.61 - 1.24 mg/dL   Calcium 9.0 8.9 - 10.3 mg/dL   Total Protein 7.5 6.5 - 8.1 g/dL   Albumin 4.4 3.5 - 5.0 g/dL   AST 37 15 - 41 U/L   ALT 18 0 - 44 U/L   Alkaline Phosphatase 52 38 - 126 U/L   Total Bilirubin 1.5 (H) 0.3 - 1.2 mg/dL    Comment: SLIGHT HEMOLYSIS   GFR calc non Af Amer >60 >60 mL/min   GFR calc Af Amer >60 >60 mL/min   Anion gap 19 (H) 5 - 15    Comment: Performed at [facility], [address] [place], [place] [address]  CBC with Differential     Status: Abnormal   Collection Time: 11/04/18  3:59 AM  Result Value Ref Range   WBC 7.2 4.0 - 10.5 K/uL  Comment: WHITE COUNT CONFIRMED ON SMEAR   RBC 5.05 4.22 - 5.81 MIL/uL   Hemoglobin 12.9 (L) 13.0 - 17.0 g/dL   HCT 78.237.7 (L) 95.639.0 - 21.352.0 %   MCV 74.7 (L) 80.0 - 100.0 fL   MCH 25.5 (L) 26.0 - 34.0 pg   MCHC 34.2 30.0 - 36.0 g/dL   RDW 08.615.3 57.811.5 - 46.915.5 %   Platelets 133 (L) 150 - 400 K/uL   nRBC 0.4 (H) 0.0 - 0.2 %   Neutrophils Relative % 45 %   Neutro Abs 3.3 1.7 - 7.7 K/uL   Lymphocytes Relative 38 %   Lymphs Abs 2.8 0.7 - 4.0 K/uL   Monocytes Relative 8 %   Monocytes Absolute 0.6 0.1 - 1.0 K/uL   Eosinophils Relative 3 %   Eosinophils Absolute 0.2 0.0 - 0.5 K/uL   Basophils Relative 1 %   Basophils Absolute 0.0 0.0 - 0.1 K/uL   WBC Morphology Abnormal lymphocytes present    Smear Review Normal platelet morphology    Immature Granulocytes 5 %   Abs Immature Granulocytes 0.34 (H) 0.00 - 0.07 K/uL   Schistocytes PRESENT    Tear Drop Cells PRESENT    Target Cells PRESENT     Comment:  Performed at [facility], [address] [place], [place] [address]  Reticulocytes     Status: Abnormal   Collection Time: 11/04/18  3:59 AM  Result Value Ref Range   Retic Ct Pct 3.3 (H) 0.4 - 3.1 %   RBC. 5.09 4.22 - 5.81 MIL/uL   Retic Count, Absolute 168.0 19.0 - 186.0 K/uL   Immature Retic Fract 29.1 (H) 2.3 - 15.9 %    Comment: Performed at [facility], [address] [place], [place] [address]  SARS Coronavirus 2 [facility](Hospital order, Performed in [facility]) Nasopharyngeal Nasopharyngeal Swab     Status: None   Collection Time: 11/04/18  7:39 AM   Specimen: Nasopharyngeal Swab  Result Value Ref Range   SARS Coronavirus 2 NEGATIVE NEGATIVE    Comment: (NOTE) If result is NEGATIVE SARS-CoV-2 target nucleic acids are NOT DETECTED. The SARS-CoV-2 RNA is generally detectable in upper and lower  respiratory specimens during the acute phase of infection. The lowest  concentration of SARS-CoV-2 viral copies this assay can detect is 250  copies / mL. A negative result does not preclude SARS-CoV-2 infection  and should not be used as the sole basis for treatment or other  patient management decisions.  A negative result may occur with  improper specimen collection / handling, submission of specimen other  than nasopharyngeal swab, presence of viral mutation(s) within the  areas targeted by this assay, and inadequate number of viral copies  (<250 copies / mL). A negative result must be combined with clinical  observations, patient history, and epidemiological information. If result is POSITIVE SARS-CoV-2 target nucleic acids are DETECTED. The SARS-CoV-2 RNA is generally detectable in upper and lower  respiratory specimens dur ing the acute phase of infection.  Positive  results are indicative of active infection with SARS-CoV-2.  Clinical  correlation with patient history and other diagnostic information is  necessary to determine patient infection  status.  Positive results do  not rule out bacterial infection or co-infection with other viruses. If result is PRESUMPTIVE POSTIVE SARS-CoV-2 nucleic acids MAY BE PRESENT.   A presumptive positive result was obtained on the submitted specimen  and confirmed on repeat testing.  While 2019  novel coronavirus  (SARS-CoV-2) nucleic acids may be present in the submitted sample  additional confirmatory testing may be necessary for epidemiological  and / or clinical management purposes  to differentiate between  SARS-CoV-2 and other Sarbecovirus currently known to infect humans.  If clinically indicated additional testing with an alternate test  methodology [phone]) is advised. The SARS-CoV-2 RNA is generally  detectable in upper and lower respiratory sp ecimens during the acute  phase of infection. The expected result is Negative. Fact Sheet for Patients:  BoilerBrush.com.cy Fact Sheet for Healthcare Providers: https://pope.com/ This test is not yet approved or cleared by the [place] FDA and has been authorized for detection and/or diagnosis of SARS-CoV-2 by FDA under an Emergency Use Authorization (EUA).  This EUA will remain in effect (meaning this test can be used) for the duration of the COVID-19 declaration under Section 564(b)(1) of the Act, 21 U.S.C. section 360bbb-3(b)(1), unless the authorization is terminated or revoked sooner. Performed at [facility], [address] [place], [place] [address]    No results found.  Pending Labs [employer] (From admission, onward)    Start     Ordered   Signed and Held  HIV antibody (Routine Testing)  Once,   R     Signed and Held   Signed and Held  CBC  (enoxaparin (LOVENOX)    CrCl >/= 30 ml/min)  Once,   R    Comments: Baseline for enoxaparin therapy IF NOT ALREADY DRAWN.  Notify MD if PLT < 100 K.    Signed and Held   Signed and Held  Creatinine, serum   (enoxaparin (LOVENOX)    CrCl >/= 30 ml/min)  Once,   R    Comments: Baseline for enoxaparin therapy IF NOT ALREADY DRAWN.    Signed and Held   Signed and Held  Creatinine, serum  (enoxaparin (LOVENOX)    CrCl >/= 30 ml/min)  Weekly,   R    Comments: while on enoxaparin therapy    Signed and Held   Signed and Held  Comprehensive metabolic panel  Tomorrow morning,   R     Signed and Held   Signed and Held  CBC with Differential/Platelet  Tomorrow morning,   R     Signed and Held          Vitals/Pain Today's Vitals   11/04/18 0700 11/04/18 0704 11/04/18 0730 11/04/18 0751  BP: 131/82  120/73   Pulse: 73  99   Resp: 12  20   Temp:      TempSrc:      SpO2: 96%  97%   Weight:      Height:      PainSc:  10-Worst pain ever  10-Worst pain ever    Isolation Precautions No active isolations  Medications Medications  sodium chloride flush (NS) 0.9 % injection 3 mL (3 mLs Intravenous Not Given 11/04/18 0405)  morphine 4 MG/ML injection 8 mg (8 mg Intravenous Given 11/04/18 0806)  morphine 4 MG/ML injection 4 mg (4 mg Intravenous Given 11/04/18 0410)  sodium chloride 0.9 % bolus 1,000 mL (0 mLs Intravenous Stopped 11/04/18 0510)  ondansetron (ZOFRAN) injection 4 mg (4 mg Intravenous Given 11/04/18 0418)  morphine 4 MG/ML injection 8 mg (8 mg Intravenous Given 11/04/18 0539)  morphine 4 MG/ML injection 8 mg (8 mg Intravenous Given 11/04/18 0620)  morphine 4 MG/ML injection 8 mg (8 mg Intravenous Given 11/04/18 0705)  diphenhydrAMINE (BENADRYL) injection 25 mg (  25 mg Intravenous Given 11/04/18 0736)    Mobility walks Low fall risk   Focused Assessments .   R Recommendations: See Admitting Provider Note  Report given to:   Additional Notes: Patient has standing order for 8mg  of Morphine, but it has not been effective in managing pain. Allergic to Dilaudid and prefers not to take anything else.

## 2018-11-05 DIAGNOSIS — R112 Nausea with vomiting, unspecified: Secondary | ICD-10-CM | POA: Diagnosis present

## 2018-11-05 LAB — CBC WITH DIFFERENTIAL/PLATELET
Abs Immature Granulocytes: 0.49 10*3/uL — ABNORMAL HIGH (ref 0.00–0.07)
Basophils Absolute: 0.1 10*3/uL (ref 0.0–0.1)
Basophils Relative: 0 %
Eosinophils Absolute: 0 10*3/uL (ref 0.0–0.5)
Eosinophils Relative: 0 %
HCT: 30.3 % — ABNORMAL LOW (ref 39.0–52.0)
Hemoglobin: 10.3 g/dL — ABNORMAL LOW (ref 13.0–17.0)
Immature Granulocytes: 2 %
Lymphocytes Relative: 4 %
Lymphs Abs: 1.2 10*3/uL (ref 0.7–4.0)
MCH: 25.6 pg — ABNORMAL LOW (ref 26.0–34.0)
MCHC: 34 g/dL (ref 30.0–36.0)
MCV: 75.2 fL — ABNORMAL LOW (ref 80.0–100.0)
Monocytes Absolute: 4.6 10*3/uL — ABNORMAL HIGH (ref 0.1–1.0)
Monocytes Relative: 16 %
Neutro Abs: 23.1 10*3/uL — ABNORMAL HIGH (ref 1.7–7.7)
Neutrophils Relative %: 78 %
Platelets: 105 10*3/uL — ABNORMAL LOW (ref 150–400)
RBC: 4.03 MIL/uL — ABNORMAL LOW (ref 4.22–5.81)
RDW: 17.7 % — ABNORMAL HIGH (ref 11.5–15.5)
WBC: 29.4 10*3/uL — ABNORMAL HIGH (ref 4.0–10.5)
nRBC: 2 % — ABNORMAL HIGH (ref 0.0–0.2)

## 2018-11-05 LAB — COMPREHENSIVE METABOLIC PANEL
ALT: 228 U/L — ABNORMAL HIGH (ref 0–44)
AST: 448 U/L — ABNORMAL HIGH (ref 15–41)
Albumin: 4.1 g/dL (ref 3.5–5.0)
Alkaline Phosphatase: 71 U/L (ref 38–126)
Anion gap: 10 (ref 5–15)
BUN: 12 mg/dL (ref 6–20)
CO2: 25 mmol/L (ref 22–32)
Calcium: 8.7 mg/dL — ABNORMAL LOW (ref 8.9–10.3)
Chloride: 103 mmol/L (ref 98–111)
Creatinine, Ser: 1.13 mg/dL (ref 0.61–1.24)
GFR calc Af Amer: >60 mL/min (ref >60)
GFR calc non Af Amer: >60 mL/min (ref >60)
Glucose, Bld: 116 mg/dL — ABNORMAL HIGH (ref 70–99)
Potassium: 4.8 mmol/L (ref 3.5–5.1)
Sodium: 138 mmol/L (ref 135–145)
Total Bilirubin: 1.3 mg/dL — ABNORMAL HIGH (ref 0.3–1.2)
Total Protein: 7.1 g/dL (ref 6.5–8.1)

## 2018-11-05 MED ORDER — ACETAMINOPHEN 325 MG PO TABS
650.0000 mg | ORAL_TABLET | Freq: Four times a day (QID) | ORAL | Status: DC | PRN
  Administered 2018-11-05 – 2018-11-07 (×5): 650 mg via ORAL
  Filled 2018-11-05 (×5): qty 2

## 2018-11-05 NOTE — Progress Notes (Signed)
Pt is in [facility]. Tylenol given for fever. Will recheck in 30 minutes from now. Pt on MS PCA. Will monitor. Alerted night charge nurse on pt's condition.

## 2018-11-05 NOTE — Progress Notes (Addendum)
MEWS/VS Documentation  On call provider notified for MEWS score 2/yellow. PRN tylenol ordered for temp >101. Will monitor VS per protocol.      11/04/2018 1929 11/04/2018 2015 11/04/2018 2303 11/05/2018 0013   MEWS Score:  0  1  0  2   MEWS Score Color:  Green  Green  Green  Yellow   Resp:  19  (!) 21  20  (!) 24   Pulse:  -  94  -  (!) 110   BP:  -  126/81  -  132/90   Temp:  -  98 F (36.7 C)  -  100.1 F (37.8 C)   O2 Device:  Nasal Cannula  Nasal Cannula  Nasal Cannula  Room Air   O2 Flow Rate (L/min):  2 L/min  -  2 L/min  -

## 2018-11-05 NOTE — Progress Notes (Signed)
   11/05/18 1947  Vitals  Temp 100 F (37.8 C)  BP 119/79  MAP (mmHg) 93  BP Location Left Arm  BP Method Automatic  Patient Position (if appropriate) Lying  Pulse Rate (!) 114  Pulse Rate Source Monitor  Resp (!) 24  Oxygen Therapy  SpO2 95 %  O2 Device Room Air  MEWS Score  MEWS RR 1  MEWS Pulse 2  MEWS Systolic 0  MEWS LOC 0  MEWS Temp 0  MEWS Score 3  MEWS Score Color Yellow   Pt is in [facility], PCA encouraged for pain that is contributing to increased HR and RR. PRN tylenol given by dayshift nurse at shift change (~1645) and temp went from 101.3 to 100. Will continue to monitor closely and recheck VS as necessary. Discussed MEWS score with Abigail Butts RN (dayshift charge RN) and updated her on his condition before she left for the day and updated ICU RN on MEWS.

## 2018-11-05 NOTE — Progress Notes (Signed)
Pt's PCA had 0mg  of morphine recorded used for the 4 hr period since midnight. Each time we round on pt, he appears to be in pain and we have encouraged use of the PCA but he has declined. He has had some restlessness and disorientation at times, he says this is a side effect of the morphine. He states this is the only med he can take for sickle cell crisis due to allergies to other drugs. On most recent round, IV noted to be infiltrated. Paged IV team to obtain new access. Continue to monitor. Hortencia Conradi RN

## 2018-11-05 NOTE — Progress Notes (Signed)
Subjective: Patient is a 28 year old gentleman with history of sickle cell disease who was admitted yesterday with sickle cell painful crisis.  He also has some nausea with vomiting.  Suspected GI bleed but no show evidence.  Hemoglobin has dropped initially to 10.9 on admission but stable since then at 10.3.  He has leukocytosis this morning.  He otherwise has thrombocytopenia also which appears to be stabilizing.  Patient is complaining of continuous pain but is getting more drowsy with IV morphine.  He has been on oral and medication at home and is selectively nave only on short acting pain medications at home.  No diarrhea this morning.  No nausea or vomiting.  Objective: Vital signs in last 24 hours: Temp:  [98 F (36.7 C)-100.1 F (37.8 C)] 100.1 F (37.8 C) (09/13 0418) Pulse Rate:  [72-110] 101 (09/13 0418) Resp:  [16-26] 21 (09/13 1014) BP: (112-132)/(73-90) 120/81 (09/13 0418) SpO2:  [92 %-99 %] 96 % (09/13 1014) Weight change:  Last BM Date: 11/04/18  Intake/Output from previous day: 09/12 0701 - 09/13 0700 In: 882.2 [P.O.:240; I.V.:642.2] Out: 350 [Urine:350] Intake/Output this shift: No intake/output data recorded.  General appearance: alert, cooperative, appears stated age and no distress Neck: no adenopathy, no carotid bruit, no JVD, supple, symmetrical, trachea midline and thyroid not enlarged, symmetric, no tenderness/mass/nodules Back: symmetric, no curvature. ROM normal. No CVA tenderness. Resp: clear to auscultation bilaterally Cardio: regular rate and rhythm, S1, S2 normal, no murmur, click, rub or gallop Male genitalia: normal Extremities: extremities normal, atraumatic, no cyanosis or edema Pulses: 2+ and symmetric Skin: Skin color, texture, turgor normal. No rashes or lesions Neurologic: Grossly normal  Lab Results: Recent Labs    11/04/18 1056 11/05/18 0629  WBC 16.3* 29.4*  HGB 10.9* 10.3*  HCT 32.0* 30.3*  PLT 47* 105*   BMET Recent Labs   11/04/18 0359 11/04/18 1056 11/05/18 0629  NA 140  --  138  K 3.5  --  4.8  CL 103  --  103  CO2 18*  --  25  GLUCOSE 113*  --  116*  BUN 5*  --  12  CREATININE 1.01 0.94 1.13  CALCIUM 9.0  --  8.7*    Studies/Results: No results found.  Medications: I have reviewed the patient's current medications.  Assessment/Plan: A 28 year old gentleman admitted with sickle cell painful crisis. #1 sickle cell painful crisis: Patient is currently on morphine PCA which he has been refusing.  Also Toradol is being held because of suspected vomiting with blood.  Also thrombocytopenia.  Continue IV fluids.  Restarted oxycodone orally.)  His PCA at a lower dose.  Continue monitoring  #2 anemia of chronic disease: Initial drop in hemoglobin most likely due to hemodilution from IV fluids.  Currently stabilized.  Continue to monitor H&H.  #3 tobacco abuse: Counseling provided.  Nicotine patch.  #4 nausea with vomiting: This appears to have resolved.  No evidence of active GI bleed.  Continue monitoring.  #5 chronic pain syndrome: On oral medications in the morning.  Continue.  LOS: 1 day   Paul Campos,Paul Campos 11/05/2018, 11:03 AM

## 2018-11-06 DIAGNOSIS — D5700 Hb-SS disease with crisis, unspecified: Principal | ICD-10-CM

## 2018-11-06 LAB — HIV ANTIBODY (ROUTINE TESTING W REFLEX): HIV Screen 4th Generation wRfx: NONREACTIVE

## 2018-11-06 MED ORDER — HALOPERIDOL LACTATE 5 MG/ML IJ SOLN
5.0000 mg | Freq: Four times a day (QID) | INTRAMUSCULAR | Status: DC | PRN
  Administered 2018-11-06: 5 mg via INTRAVENOUS
  Filled 2018-11-06: qty 1

## 2018-11-06 MED ORDER — OXYCODONE HCL 5 MG PO TABS
20.0000 mg | ORAL_TABLET | Freq: Four times a day (QID) | ORAL | Status: DC | PRN
  Administered 2018-11-06: 20 mg via ORAL
  Filled 2018-11-06: qty 4

## 2018-11-06 MED ORDER — MORPHINE SULFATE (PF) 2 MG/ML IV SOLN: 2.0000 mg | INTRAVENOUS | Status: DC | PRN

## 2018-11-06 NOTE — Progress Notes (Signed)
   11/06/18 1249  MEWS Score  Resp (!) 24 (nurse notified)  Pulse Rate (!) 111 (nurse notified)  BP 130/82  Temp 99.4 F (37.4 C)  SpO2 98 %  O2 Device Room Air  MEWS Score  MEWS RR 1  MEWS Pulse 2  MEWS Systolic 0  MEWS LOC 1  MEWS Temp 0  MEWS Score 4  MEWS Score Color Red  MEWS Assessment  Is this an acute change? Yes  MEWS guidelines implemented *See Row Information* Red  Rapid Response Notification  Name of Rapid Response RN Notified Erin RN  Date Rapid Response Notified 11/06/18  Time Rapid Response Notified 1300  Provider Notification  Provider Name/Title Smith Robert MD  Date Provider Notified 11/06/18  Time Provider Notified 1305  Notification Type Page  Notification Reason Change in status;Other (Comment) (Red MEWS)  Response No new orders  Date of Provider Response 11/06/18  Time of Provider Response [place] to come and assess pt, pt deemed appropriate for this floor at the time. Pt denies pain at this time as well. Pt given tylenol per MAR. Will continue to monitor.

## 2018-11-06 NOTE — Progress Notes (Addendum)
Subjective: Paul Campos, a 28 year old male with a medical history significant for sickle cell disease and was admitted for sickle cell crisis.  Patient states that his pain is not improved overnight.  He says that he is ambulated in room without complication.  His pain is primarily to mid chest and lower extremities.  Patient's IV morphine PCA was discontinued, patient was having difficulty remembering to use.  Patient is also not requesting oral pain medications from RN, despite complaining of severe pain to lower extremities. Patient was febrile overnight.  His maximum temperature was 101.2.  No new orders were received at that time. Patient appears to be resting comfortably. Patient currently denies headache, chest pain, dysuria, nausea, vomiting, or diarrhea   Objective:  Vital signs in last 24 hours:  Vitals:   11/06/18 0331 11/06/18 0434 11/06/18 0642 11/06/18 0828  BP:  131/84  129/81  Pulse:  (!) 117  (!) 109  Resp: 20   (!) 24  Temp:  (!) 101.2 F (38.4 C) 97.9 F (36.6 C) 99.7 F (37.6 C)  TempSrc:   Axillary Oral  SpO2: 96% 94%  96%  Weight:      Height:        Intake/Output from previous day:   Intake/Output Summary (Last 24 hours) at 11/06/2018 1145 Last data filed at 11/06/2018 0833 Gross per 24 hour  Intake 3929.52 ml  Output 500 ml  Net 3429.52 ml    Physical Exam: General: Alert, awake, oriented x3, in no acute distress.  HEENT: [place]/AT PEERL, EOMI Neck: Trachea midline,  no masses, no thyromegal,y no JVD, no carotid bruit OROPHARYNX:  Moist, No exudate/ erythema/lesions.  Heart: Regular rate and rhythm, without murmurs, rubs, gallops, PMI non-displaced, no heaves or thrills on palpation.  Lungs: Clear to auscultation, no wheezing or rhonchi noted. No increased vocal fremitus resonant to percussion  Abdomen: Soft, nontender, nondistended, positive bowel sounds, no masses no hepatosplenomegaly noted..  Neuro: No focal neurological deficits noted cranial  nerves II through XII grossly intact. DTRs 2+ bilaterally upper and lower extremities. Strength 5 out of 5 in bilateral upper and lower extremities. Musculoskeletal: No warm swelling or erythema around joints, no spinal tenderness noted. Psychiatric: Patient alert and oriented x3, good insight and cognition, good recent to remote recall. Lymph node survey: No cervical axillary or inguinal lymphadenopathy noted.  Lab Results:  Basic Metabolic Panel:    Component Value Date/Time   NA 138 11/05/2018 0629   NA 141 10/27/2018 1025   K 4.8 11/05/2018 0629   CL 103 11/05/2018 0629   CO2 25 11/05/2018 0629   BUN 12 11/05/2018 0629   BUN 5 (L) 10/27/2018 1025   CREATININE 1.13 11/05/2018 0629   GLUCOSE 116 (H) 11/05/2018 0629   CALCIUM 8.7 (L) 11/05/2018 0629   CBC:    Component Value Date/Time   WBC 29.4 (H) 11/05/2018 0629   HGB 10.3 (L) 11/05/2018 0629   HGB 12.3 (L) 10/27/2018 1025   HCT 30.3 (L) 11/05/2018 0629   HCT 37.6 10/27/2018 1025   PLT 105 (L) 11/05/2018 0629   PLT 146 (L) 10/27/2018 1025   MCV 75.2 (L) 11/05/2018 0629   MCV 77 (L) 10/27/2018 1025   NEUTROABS 23.1 (H) 11/05/2018 0629   NEUTROABS 2.7 10/27/2018 1025   LYMPHSABS 1.2 11/05/2018 0629   LYMPHSABS 1.7 10/27/2018 1025   MONOABS 4.6 (H) 11/05/2018 0629   EOSABS 0.0 11/05/2018 0629   EOSABS 0.3 10/27/2018 1025   BASOSABS 0.1 11/05/2018 5993  BASOSABS 0.0 10/27/2018 1025    Recent Results (from the past 240 hour(s))  SARS Coronavirus 2 [facility](Hospital order, Performed in [facility] lab) Nasopharyngeal Nasopharyngeal Swab     Status: None   Collection Time: 11/04/18  7:39 AM   Specimen: Nasopharyngeal Swab  Result Value Ref Range Status   SARS Coronavirus 2 NEGATIVE NEGATIVE Final    Comment: (NOTE) If result is NEGATIVE SARS-CoV-2 target nucleic acids are NOT DETECTED. The SARS-CoV-2 RNA is generally detectable in upper and lower  respiratory specimens during the acute phase of infection. The  lowest  concentration of SARS-CoV-2 viral copies this assay can detect is 250  copies / mL. A negative result does not preclude SARS-CoV-2 infection  and should not be used as the sole basis for treatment or other  patient management decisions.  A negative result may occur with  improper specimen collection / handling, submission of specimen other  than nasopharyngeal swab, presence of viral mutation(s) within the  areas targeted by this assay, and inadequate number of viral copies  (<250 copies / mL). A negative result must be combined with clinical  observations, patient history, and epidemiological information. If result is POSITIVE SARS-CoV-2 target nucleic acids are DETECTED. The SARS-CoV-2 RNA is generally detectable in upper and lower  respiratory specimens dur ing the acute phase of infection.  Positive  results are indicative of active infection with SARS-CoV-2.  Clinical  correlation with patient history and other diagnostic information is  necessary to determine patient infection status.  Positive results do  not rule out bacterial infection or co-infection with other viruses. If result is PRESUMPTIVE POSTIVE SARS-CoV-2 nucleic acids MAY BE PRESENT.   A presumptive positive result was obtained on the submitted specimen  and confirmed on repeat testing.  While 2019 novel coronavirus  (SARS-CoV-2) nucleic acids may be present in the submitted sample  additional confirmatory testing may be necessary for epidemiological  and / or clinical management purposes  to differentiate between  SARS-CoV-2 and other Sarbecovirus currently known to infect humans.  If clinically indicated additional testing with an alternate test  methodology [phone]([id number]) is advised. The SARS-CoV-2 RNA is generally  detectable in upper and lower respiratory sp ecimens during the acute  phase of infection. The expected result is Negative. Fact Sheet for Patients:   BoilerBrush.com.cyhttps://www.fda.gov/media/136312/download Fact Sheet for Healthcare Providers: https://pope.com/https://www.fda.gov/media/136313/download This test is not yet approved or cleared by the [place] FDA and has been authorized for detection and/or diagnosis of SARS-CoV-2 by FDA under an Emergency Use Authorization (EUA).  This EUA will remain in effect (meaning this test can be used) for the duration of the COVID-19 declaration under Section 564(b)(1) of the Act, 21 U.S.C. section 360bbb-3(b)(1), unless the authorization is terminated or revoked sooner. Performed at [facility] [place], [address] [place], [place] [address]     Studies/Results: No results found.  Medications: Scheduled Meds: . pantoprazole (PROTONIX) IV  40 mg Intravenous Q12H  . senna-docusate  1 tablet Oral BID  . sodium chloride flush  3 mL Intravenous Once   Continuous Infusions: . dextrose 5 % and 0.45% NaCl 125 mL/hr at 11/05/18 1449   PRN Meds:.acetaminophen, haloperidol lactate, morphine injection, ondansetron (ZOFRAN) IV, oxyCODONE, polyethylene glycol Assessment/Plan: Principal Problem:   Sickle cell anemia with crisis [facility](HCC) Active Problems:   Thrombocytopenia, unspecified (HCC)   Hypokalemia   Nausea & vomiting  Sickle cell pain crisis: Patient continues to complain of pain despite medication regimen.  Continue morphine  IV 2 to 4 mg every 6 hours as needed for severe pain. Oxycodone 20 mg every 6 hours was restarted on yesterday.  However patient is not requesting medication for pain control. Toradol continues to be held, patient reported vomiting with blood.  Will reassess.   Anemia of chronic disease: Stable.  Continue to follow CBC.  Tobacco abuse: Patient counseled at length on admission.  Nicotine patch in place.  Nausea and vomiting: Resolved.  Continue to monitor closely  Stable.  Continue oral medications. Code Status: Full Code Family Communication: N/A Disposition Plan: Not yet  ready for discharge   Anntionette Madkins Rennis Petty  APRN, MSN, FNP-C [facility] [address] [place], [place] [address] [phone]  If 5PM-7AM, please contact night-coverage.  11/06/2018, 11:45 AM  LOS: 2 days

## 2018-11-06 NOTE — Progress Notes (Signed)
RN has asked pt during hourly rounding what his pain level is. Pt has stated pain is 7-10 since shift change. RN (day shift yesterday and this night shift) has to encourage PCA for pain. Pt states he understands, but only 4 demands and 3 were delivered on the PCA during this shift. Will continue to monitor and encourage PCA for pain.

## 2018-11-06 NOTE — Progress Notes (Signed)
Pt has tried multiple times throughout the night to get out of bed. Instructed pt to stay in bed and use call light. No evidence of learning when discussing safety precautions. On-call MD notified of pt's poor safety awareness and impeding care. May need SA sitter for day shift. Will continue to monitor and notify charge of possible sitter.

## 2018-11-06 NOTE — Progress Notes (Signed)
Called to patients room due to red MEWS score. Patient lethargic but arousable and oriented. Patient did not report any pain, just a non-productive cough. Patient's RN to administer tylenol as ordered. Patient's RN notified provider of red mews.

## 2018-11-06 NOTE — Progress Notes (Addendum)
D/t pt's confusion and lack of using the PCA, on-call MD DC'd morphine PCA. Discussed with pt to call for pain medicine when needed and discussed the pain scale. No evidence of learning. Pt giggling and moving his arms around as RN discussed info. 5mg  of morphine PCA wasted with [facility] RN.   **UPDATE: pt's combativeness and impulsiveness continued and on-call MD notified again. PRN haldol 5mg  ordered Q6H.

## 2018-11-07 LAB — CBC
HCT: 23.6 % — ABNORMAL LOW (ref 39.0–52.0)
Hemoglobin: 8.2 g/dL — ABNORMAL LOW (ref 13.0–17.0)
MCH: 26 pg (ref 26.0–34.0)
MCHC: 34.7 g/dL (ref 30.0–36.0)
MCV: 74.9 fL — ABNORMAL LOW (ref 80.0–100.0)
Platelets: 103 10*3/uL — ABNORMAL LOW (ref 150–400)
RBC: 3.15 MIL/uL — ABNORMAL LOW (ref 4.22–5.81)
RDW: 17.8 % — ABNORMAL HIGH (ref 11.5–15.5)
WBC: 17.8 10*3/uL — ABNORMAL HIGH (ref 4.0–10.5)
nRBC: 6.9 % — ABNORMAL HIGH (ref 0.0–0.2)

## 2018-11-07 NOTE — Discharge Summary (Signed)
Physician Discharge Summary  Paul Campos UJW:[id number] DOB: 1990/10/11 DOA: 11/04/2018  PCP: Mike Gipouglas, Andre, FNP  Admit date: 11/04/2018  Discharge date: 11/07/2018  Discharge Diagnoses:  Principal Problem:   Sickle cell anemia with crisis [facility](HCC) Active Problems:   Thrombocytopenia, unspecified (HCC)   Hypokalemia   Nausea & vomiting   Discharge Condition: Stable  Disposition:  Follow-up Information    Mike Gipouglas, Andre, FNP Follow up in 2 day(s).   Specialty: Family Medicine Contact information: [address] [place] [address] [phone]          Pt is discharged home in good condition and is to follow up with Mike Gipouglas, Andre, FNP this week to have labs evaluated. Paul Campos is instructed to increase activity slowly and balance with rest for the next few days, and use prescribed medication to complete treatment of pain  Diet: Regular Wt Readings from Last 3 Encounters:  11/04/18 81.6 kg  10/27/18 77.6 kg  04/26/18 84.5 kg    History of present illness:  Paul Campos,, a 28 year old male with a medical history significant for sickle cell disease [facility] [place] with pain primarily to back and legs rated 10/10.  Pain was similar to his typical sickle cell crisis.  Pain unrelieved by home medication regimen.  Patient was treated and evaluated.  He received IV morphine in the ER without significant relief.  [facility].  He denied any fever or chills.  He also denied trauma.  Patient denied exposure to COVID-19.  He is relatively opiate nave.  ER course: Vital signs were essentially stable.  Sodium 140, potassium 3.5, chloride 103, CO2 18, glucose 113.  Creatinine 1.01 and total bilirubin is 1.5.  WBC 7.2, hemoglobin 12.9, and platelets 133.  His reticulocyte count is 168 with a percentage of 5.09.  COVID-19 screen negative.  Patient has been admitted for acute sickle cell pain  crisis.  Hospital Course:  Sickle cell disease with pain crisis: Patient was admitted for sickle cell pain crisis and managed appropriately with IVF, IV Dilaudid via PCA and IV Toradol, as well as other adjunct therapies per sickle cell pain management protocols.  Patient was unable to do PCA, he was transitioned to morphine 2 to 4 mg IV every 4 hours as needed.  Patient will resume all home medications.  [place]  His pain medications are managed by his hematologist.  Recommend the patient follow-up within 1 week.  Patient counseled at length on tobacco use.  Patient says that he is not ready to quit at this time.  Paul Candlengelo is alert, oriented, and ambulating without assistance.  Oxygen saturation is above 90% on RA.  Patient was discharged home today in a hemodynamically stable condition.   Discharge Exam: Vitals:   11/07/18 0635 11/07/18 0930  BP:  134/79  Pulse:  94  Resp:  16  Temp: 99.4 F (37.4 C) 99.3 F (37.4 C)  SpO2:  99%   Vitals:   11/07/18 0216 11/07/18 0526 11/07/18 0635 11/07/18 0930  BP: 136/82 136/82  134/79  Pulse: 88 (!) 120  94  Resp: 16 15  16   Temp: 99.6 F (37.6 C) (!) 103 F (39.4 C) 99.4 F (37.4 C) 99.3 F (37.4 C)  TempSrc:   Oral Oral  SpO2:  99%  99%  Weight:      Height:        General  appearance : Awake, alert, not in any distress. Speech Clear. Not toxic looking HEENT: Atraumatic and Normocephalic, pupils equally reactive to light and accomodation Neck: Supple, no JVD. No cervical lymphadenopathy.  Chest: Good air entry bilaterally, no added sounds  CVS: S1 S2 regular, no murmurs.  Abdomen: Bowel sounds present, Non tender and not distended with no gaurding, rigidity or rebound. Extremities: B/L Lower Ext shows no edema, both legs are warm to touch Neurology: Awake alert, and oriented X 3, CN II-XII intact, Non focal Skin: No Rash  Discharge Instructions  Discharge Instructions    Discharge  patient   Complete by: As directed    Discharge disposition: 01-Home or Self Care   Discharge patient date: 11/07/2018     Allergies as of 11/07/2018      Reactions   Dilaudid [hydromorphone Hcl] Hives   Hydromorphone Itching   Morphine okay.   Cefotaxime Itching, Rash   Hives      Medication List    TAKE these medications   folic acid 1 MG tablet Commonly known as: FOLVITE Take 1 tablet (1 mg total) by mouth daily.   ibuprofen 800 MG tablet Commonly known as: ADVIL Take 1 tablet (800 mg total) by mouth every 8 (eight) hours as needed. What changed: reasons to take this   Oxycodone HCl 20 MG Tabs Take 1 tablet (20 mg total) by mouth every 6 (six) hours as needed for up to 15 days. What changed: reasons to take this       The results of significant diagnostics from this hospitalization (including imaging, microbiology, ancillary and laboratory) are listed below for reference.    Significant Diagnostic Studies: No results found.  Microbiology: Recent Results (from the past 240 hour(s))  SARS Coronavirus 2 [facility] order, Performed in [facility] hospital lab) Nasopharyngeal Nasopharyngeal Swab     Status: None   Collection Time: 11/04/18  7:39 AM   Specimen: Nasopharyngeal Swab  Result Value Ref Range Status   SARS Coronavirus 2 NEGATIVE NEGATIVE Final    Comment: (NOTE) If result is NEGATIVE SARS-CoV-2 target nucleic acids are NOT DETECTED. The SARS-CoV-2 RNA is generally detectable in upper and lower  respiratory specimens during the acute phase of infection. The lowest  concentration of SARS-CoV-2 viral copies this assay can detect is 250  copies / mL. A negative result does not preclude SARS-CoV-2 infection  and should not be used as the sole basis for treatment or other  patient management decisions.  A negative result may occur with  improper specimen collection / handling, submission of specimen other  than nasopharyngeal swab, presence of viral mutation(s)  within the  areas targeted by this assay, and inadequate number of viral copies  (<250 copies / mL). A negative result must be combined with clinical  observations, patient history, and epidemiological information. If result is POSITIVE SARS-CoV-2 target nucleic acids are DETECTED. The SARS-CoV-2 RNA is generally detectable in upper and lower  respiratory specimens dur ing the acute phase of infection.  Positive  results are indicative of active infection with SARS-CoV-2.  Clinical  correlation with patient history and other diagnostic information is  necessary to determine patient infection status.  Positive results do  not rule out bacterial infection or co-infection with other viruses. If result is PRESUMPTIVE POSTIVE SARS-CoV-2 nucleic acids MAY BE PRESENT.   A presumptive positive result was obtained on the submitted specimen  and confirmed on repeat testing.  While 2019 novel coronavirus  (SARS-CoV-2) nucleic acids may be  present in the submitted sample  additional confirmatory testing may be necessary for epidemiological  and / or clinical management purposes  to differentiate between  SARS-CoV-2 and other Sarbecovirus currently known to infect humans.  If clinically indicated additional testing with an alternate test  methodology [phone]) is advised. The SARS-CoV-2 RNA is generally  detectable in upper and lower respiratory sp ecimens during the acute  phase of infection. The expected result is Negative. Fact Sheet for Patients:  BoilerBrush.com.cy Fact Sheet for Healthcare Providers: https://pope.com/ This test is not yet approved or cleared by the [place] FDA and has been authorized for detection and/or diagnosis of SARS-CoV-2 by FDA under an Emergency Use Authorization (EUA).  This EUA will remain in effect (meaning this test can be used) for the duration of the COVID-19 declaration under Section 564(b)(1) of the Act,  21 U.S.C. section 360bbb-3(b)(1), unless the authorization is terminated or revoked sooner. Performed at [facility], [address] [place], [place] [address]      Labs: Basic Metabolic Panel: Recent Labs  Lab 11/04/18 0359 11/04/18 1056 11/05/18 0629  NA 140  --  138  K 3.5  --  4.8  CL 103  --  103  CO2 18*  --  25  GLUCOSE 113*  --  116*  BUN 5*  --  12  CREATININE 1.01 0.94 1.13  CALCIUM 9.0  --  8.7*   Liver Function Tests: Recent Labs  Lab 11/04/18 0359 11/05/18 0629  AST 37 448*  ALT 18 228*  ALKPHOS 52 71  BILITOT 1.5* 1.3*  PROT 7.5 7.1  ALBUMIN 4.4 4.1   No results for input(s): LIPASE, AMYLASE in the last 168 hours. No results for input(s): AMMONIA in the last 168 hours. CBC: Recent Labs  Lab 11/04/18 0359 11/04/18 1056 11/05/18 0629  WBC 7.2 16.3* 29.4*  NEUTROABS 3.3  --  23.1*  HGB 12.9* 10.9* 10.3*  HCT 37.7* 32.0* 30.3*  MCV 74.7* 77.1* 75.2*  PLT 133* 47* 105*   Cardiac Enzymes: No results for input(s): CKTOTAL, CKMB, CKMBINDEX, TROPONINI in the last 168 hours. BNP: Invalid input(s): POCBNP CBG: No results for input(s): GLUCAP in the last 168 hours.  Time coordinating discharge: 50 minutes  Signed:  Nolon Nations  APRN, MSN, FNP-C Patient Care [facility] [address] [place], [place] [address] [phone]  [facility] 11/07/2018, 11:36 AM

## 2018-11-07 NOTE — Discharge Instructions (Signed)
Sickle Cell Anemia, Adult ° °Sickle cell anemia is a condition where your red blood cells are shaped like sickles. Red blood cells carry oxygen through the body. Sickle-shaped cells do not live as long as normal red blood cells. They also clump together and block blood from flowing through the blood vessels. This prevents the body from getting enough oxygen. Sickle cell anemia causes organ damage and pain. It also increases the risk of infection. °Follow these instructions at home: °Medicines °· Take over-the-counter and prescription medicines only as told by your doctor. °· If you were prescribed an antibiotic medicine, take it as told by your doctor. Do not stop taking the antibiotic even if you start to feel better. °· If you develop a fever, do not take medicines to lower the fever right away. Tell your doctor about the fever. °Managing pain, stiffness, and swelling °· Try these methods to help with pain: °? Use a heating pad. °? Take a warm bath. °? Distract yourself, such as by watching TV. °Eating and drinking °· Drink enough fluid to keep your pee (urine) clear or pale yellow. Drink more in hot weather and during exercise. °· Limit or avoid alcohol. °· Eat a healthy diet. Eat plenty of fruits, vegetables, whole grains, and lean protein. °· Take vitamins and supplements as told by your doctor. °Traveling °· When traveling, keep these with you: °? Your medical information. °? The names of your doctors. °? Your medicines. °· If you need to take an airplane, talk to your doctor first. °Activity °· Rest often. °· Avoid exercises that make your heart beat much faster, such as jogging. °General instructions °· Do not use products that have nicotine or tobacco, such as cigarettes and e-cigarettes. If you need help quitting, ask your doctor. °· Consider wearing a medical alert bracelet. °· Avoid being in high places (high altitudes), such as mountains. °· Avoid very hot or cold temperatures. °· Avoid places where the  temperature changes a lot. °· Keep all follow-up visits as told by your doctor. This is important. °Contact a doctor if: °· A joint hurts. °· Your feet or hands hurt or swell. °· You feel tired (fatigued). °Get help right away if: °· You have symptoms of infection. These include: °? Fever. °? Chills. °? Being very tired. °? Irritability. °? Poor eating. °? Throwing up (vomiting). °· You feel dizzy or faint. °· You have new stomach pain, especially on the left side. °· You have a an erection (priapism) that lasts more than 4 hours. °· You have numbness in your arms or legs. °· You have a hard time moving your arms or legs. °· You have trouble talking. °· You have pain that does not go away when you take medicine. °· You are short of breath. °· You are breathing fast. °· You have a long-term cough. °· You have pain in your chest. °· You have a bad headache. °· You have a stiff neck. °· Your stomach looks bloated even though you did not eat much. °· Your skin is pale. °· You suddenly cannot see well. °Summary °· Sickle cell anemia is a condition where your red blood cells are shaped like sickles. °· Follow your doctor's advice on ways to manage pain, food to eat, activities to do, and steps to take for safe travel. °· Get medical help right away if you have any signs of infection, such as a fever. °This information is not intended to replace advice given to you by   your health care provider. Make sure you discuss any questions you have with your health care provider. °Document Released: 11/29/2012 Document Revised: 06/02/2018 Document Reviewed: 03/16/2016 °Elsevier Patient Education © 2020 [employer] Inc. ° °

## 2018-11-08 ENCOUNTER — Other Ambulatory Visit: Payer: Self-pay

## 2018-11-08 ENCOUNTER — Emergency Department (HOSPITAL_BASED_OUTPATIENT_CLINIC_OR_DEPARTMENT_OTHER)
Admission: EM | Admit: 2018-11-08 | Discharge: 2018-11-08 | Disposition: A | Discharge status: Home / Self Care | Payer: Self-pay | Attending: Emergency Medicine | Admitting: Emergency Medicine

## 2018-11-08 ENCOUNTER — Encounter (HOSPITAL_BASED_OUTPATIENT_CLINIC_OR_DEPARTMENT_OTHER): Payer: Self-pay | Admitting: Emergency Medicine

## 2018-11-08 DIAGNOSIS — Z5321 Procedure and treatment not carried out due to patient leaving prior to being seen by health care provider: Secondary | ICD-10-CM | POA: Insufficient documentation

## 2018-11-08 DIAGNOSIS — D5700 Hb-SS disease with crisis, unspecified: Secondary | ICD-10-CM | POA: Insufficient documentation

## 2018-11-08 NOTE — ED Triage Notes (Signed)
Sickle cell pain onset today, discharged from [facility] yesterday.

## 2018-11-13 ENCOUNTER — Other Ambulatory Visit: Payer: Self-pay | Admitting: Family Medicine

## 2018-11-13 ENCOUNTER — Telehealth: Payer: Self-pay

## 2018-11-13 DIAGNOSIS — D571 Sickle-cell disease without crisis: Secondary | ICD-10-CM

## 2018-11-13 MED ORDER — OXYCODONE HCL 20 MG PO TABS: 20.0000 mg | ORAL_TABLET | Freq: Four times a day (QID) | ORAL | 0 refills | Status: DC | PRN

## 2018-11-13 NOTE — Telephone Encounter (Signed)
Patient informed of meds sent to pharmacy. Thanks!

## 2018-11-13 NOTE — Telephone Encounter (Signed)
Refill request for Oxycodone 20mg . Please advise. Thanks!

## 2018-12-11 ENCOUNTER — Encounter: Payer: Self-pay | Admitting: Family Medicine

## 2018-12-11 ENCOUNTER — Telehealth: Payer: Self-pay

## 2018-12-11 NOTE — Telephone Encounter (Signed)
Refill request for oxycodone 20mg. Please advise. Thanks!  

## 2018-12-12 ENCOUNTER — Other Ambulatory Visit: Payer: Self-pay | Admitting: Internal Medicine

## 2018-12-12 DIAGNOSIS — D571 Sickle-cell disease without crisis: Secondary | ICD-10-CM

## 2018-12-12 MED ORDER — OXYCODONE HCL 20 MG PO TABS: 20.0000 mg | ORAL_TABLET | Freq: Four times a day (QID) | ORAL | 0 refills | Status: DC | PRN

## 2018-12-12 NOTE — Telephone Encounter (Signed)
Refilled

## 2019-01-08 ENCOUNTER — Encounter: Payer: Self-pay | Admitting: Family Medicine

## 2019-01-08 ENCOUNTER — Telehealth: Payer: Self-pay

## 2019-01-08 DIAGNOSIS — D571 Sickle-cell disease without crisis: Secondary | ICD-10-CM

## 2019-01-08 MED ORDER — IBUPROFEN 800 MG PO TABS: 800.0000 mg | ORAL_TABLET | Freq: Three times a day (TID) | ORAL | 1 refills | Status: DC | PRN

## 2019-01-08 NOTE — Telephone Encounter (Signed)
Refill request for oxycodone. Please advise. Thanks!  

## 2019-01-11 ENCOUNTER — Encounter: Payer: Self-pay | Admitting: Family Medicine

## 2019-01-11 ENCOUNTER — Other Ambulatory Visit: Payer: Self-pay | Admitting: Internal Medicine

## 2019-01-11 ENCOUNTER — Telehealth: Payer: Self-pay

## 2019-01-11 DIAGNOSIS — D571 Sickle-cell disease without crisis: Secondary | ICD-10-CM

## 2019-01-11 MED ORDER — OXYCODONE HCL 20 MG PO TABS: 20.0000 mg | ORAL_TABLET | Freq: Four times a day (QID) | ORAL | 0 refills | Status: AC | PRN

## 2019-01-11 NOTE — Telephone Encounter (Signed)
Patient notified via mychart

## 2019-01-11 NOTE — Telephone Encounter (Signed)
Error

## 2019-01-11 NOTE — Telephone Encounter (Signed)
Refilled

## 2019-01-12 ENCOUNTER — Other Ambulatory Visit: Payer: Self-pay | Admitting: Internal Medicine

## 2019-01-12 NOTE — Telephone Encounter (Signed)
Mychart pain medication refill

## 2019-01-12 NOTE — Telephone Encounter (Signed)
[facility] refill request.

## 2019-01-26 ENCOUNTER — Ambulatory Visit: Payer: Self-pay | Admitting: Family Medicine

## 2019-01-30 ENCOUNTER — Encounter: Payer: Self-pay | Admitting: Family Medicine

## 2019-02-01 ENCOUNTER — Emergency Department (HOSPITAL_BASED_OUTPATIENT_CLINIC_OR_DEPARTMENT_OTHER)
Admission: EM | Admit: 2019-02-01 | Discharge: 2019-02-01 | Disposition: A | Discharge status: Home / Self Care | Payer: Self-pay | Attending: Emergency Medicine | Admitting: Emergency Medicine

## 2019-02-01 ENCOUNTER — Encounter (HOSPITAL_BASED_OUTPATIENT_CLINIC_OR_DEPARTMENT_OTHER): Payer: Self-pay | Admitting: Emergency Medicine

## 2019-02-01 ENCOUNTER — Other Ambulatory Visit: Payer: Self-pay | Admitting: Family Medicine

## 2019-02-01 ENCOUNTER — Other Ambulatory Visit: Payer: Self-pay

## 2019-02-01 DIAGNOSIS — D5700 Hb-SS disease with crisis, unspecified: Secondary | ICD-10-CM

## 2019-02-01 DIAGNOSIS — G8929 Other chronic pain: Secondary | ICD-10-CM

## 2019-02-01 LAB — COMPREHENSIVE METABOLIC PANEL
ALT: 14 U/L (ref 0–44)
AST: 18 U/L (ref 15–41)
Albumin: 3.9 g/dL (ref 3.5–5.0)
Alkaline Phosphatase: 73 U/L (ref 38–126)
Anion gap: 7 (ref 5–15)
BUN: 7 mg/dL (ref 6–20)
CO2: 27 mmol/L (ref 22–32)
Calcium: 8.9 mg/dL (ref 8.9–10.3)
Chloride: 106 mmol/L (ref 98–111)
Creatinine, Ser: 0.82 mg/dL (ref 0.61–1.24)
GFR calc Af Amer: >60 mL/min (ref >60)
GFR calc non Af Amer: >60 mL/min (ref >60)
Glucose, Bld: 108 mg/dL — ABNORMAL HIGH (ref 70–99)
Potassium: 4.3 mmol/L (ref 3.5–5.1)
Sodium: 140 mmol/L (ref 135–145)
Total Bilirubin: 1.5 mg/dL — ABNORMAL HIGH (ref 0.3–1.2)
Total Protein: 7.4 g/dL (ref 6.5–8.1)

## 2019-02-01 LAB — CBC WITH DIFFERENTIAL/PLATELET
Abs Immature Granulocytes: 0.09 10*3/uL — ABNORMAL HIGH (ref 0.00–0.07)
Basophils Absolute: 0.1 10*3/uL (ref 0.0–0.1)
Basophils Relative: 0 %
Eosinophils Absolute: 0.8 10*3/uL — ABNORMAL HIGH (ref 0.0–0.5)
Eosinophils Relative: 6 %
HCT: 33.6 % — ABNORMAL LOW (ref 39.0–52.0)
Hemoglobin: 11.4 g/dL — ABNORMAL LOW (ref 13.0–17.0)
Immature Granulocytes: 1 %
Lymphocytes Relative: 21 %
Lymphs Abs: 2.8 10*3/uL (ref 0.7–4.0)
MCH: 25.8 pg — ABNORMAL LOW (ref 26.0–34.0)
MCHC: 33.9 g/dL (ref 30.0–36.0)
MCV: 76 fL — ABNORMAL LOW (ref 80.0–100.0)
Monocytes Absolute: 2.2 10*3/uL — ABNORMAL HIGH (ref 0.1–1.0)
Monocytes Relative: 16 %
Neutro Abs: 7.6 10*3/uL (ref 1.7–7.7)
Neutrophils Relative %: 56 %
Platelets: 245 10*3/uL (ref 150–400)
RBC: 4.42 MIL/uL (ref 4.22–5.81)
RDW: 15.9 % — ABNORMAL HIGH (ref 11.5–15.5)
Smear Review: NORMAL
WBC: 13.5 10*3/uL — ABNORMAL HIGH (ref 4.0–10.5)
nRBC: 0.3 % — ABNORMAL HIGH (ref 0.0–0.2)

## 2019-02-01 LAB — RETICULOCYTES
Immature Retic Fract: 41.4 % — ABNORMAL HIGH (ref 2.3–15.9)
RBC.: 4.44 MIL/uL (ref 4.22–5.81)
Retic Count, Absolute: 266.4 10*3/uL — ABNORMAL HIGH (ref 19.0–186.0)
Retic Ct Pct: 6 % — ABNORMAL HIGH (ref 0.4–3.1)

## 2019-02-01 MED ORDER — MORPHINE SULFATE (PF) 4 MG/ML IV SOLN
4.0000 mg | INTRAVENOUS | Status: AC | PRN
  Administered 2019-02-01: 4 mg via INTRAVENOUS
  Filled 2019-02-01: qty 1

## 2019-02-01 MED ORDER — OXYCODONE HCL 20 MG PO TABS: 20.0000 mg | ORAL_TABLET | Freq: Four times a day (QID) | ORAL | 0 refills | Status: DC | PRN

## 2019-02-01 MED ORDER — MORPHINE SULFATE (PF) 4 MG/ML IV SOLN
8.0000 mg | Freq: Once | INTRAVENOUS | Status: AC
  Administered 2019-02-01: 8 mg via INTRAVENOUS
  Filled 2019-02-01: qty 2

## 2019-02-01 MED ORDER — MORPHINE SULFATE (PF) 4 MG/ML IV SOLN: 8.0000 mg | Freq: Once | INTRAVENOUS | Status: DC

## 2019-02-01 MED ORDER — DIPHENHYDRAMINE HCL 25 MG PO CAPS
25.0000 mg | ORAL_CAPSULE | Freq: Once | ORAL | Status: AC
  Administered 2019-02-01: 25 mg via ORAL
  Filled 2019-02-01: qty 1

## 2019-02-01 MED ORDER — MORPHINE SULFATE (PF) 4 MG/ML IV SOLN: 4.0000 mg | INTRAVENOUS | Status: DC | PRN

## 2019-02-01 MED ORDER — KETOROLAC TROMETHAMINE 30 MG/ML IJ SOLN
30.0000 mg | INTRAMUSCULAR | Status: AC
  Administered 2019-02-01: 30 mg via INTRAVENOUS
  Filled 2019-02-01: qty 1

## 2019-02-01 MED ORDER — SODIUM CHLORIDE 0.45 % IV SOLN
INTRAVENOUS | Status: DC
  Administered 2019-02-01: 1000 mL via INTRAVENOUS

## 2019-02-01 NOTE — ED Triage Notes (Signed)
Sickle cell pain crisis that started last night.  Pain in left leg and entire back.

## 2019-02-01 NOTE — ED Notes (Signed)
ED Provider at bedside, Dr. Billy Fischer

## 2019-02-01 NOTE — Progress Notes (Signed)
  Patient was treated and evaluated in the ER at [facility]. Spoke with ER provider, Dr. Billy Fischer, and agreed that patient is appropriate for treatment in the sickle cell day infusion center on 02/02/2019. LCSW to reach out to patient concerning transportation constraints. Also, [facility] can be of assistance. lReviewed PDMP prior to prescribing opiate medication, no inconsistencies noted. Will scheduled first available follow up appointment. He has not been seen in this clinic since 05/12/2018.    Meds ordered this encounter  Medications  . Oxycodone HCl 20 MG TABS    Sig: Take 1 tablet (20 mg total) by mouth every 6 (six) hours as needed.    Dispense:  20 tablet    Refill:  0    Order Specific Question:   Supervising Provider    Answer:   Tresa Garter [[id number]]    Donia Pounds  APRN, MSN, FNP-C [facility] [address] [place], [place] [address] [phone]

## 2019-02-01 NOTE — ED Provider Notes (Signed)
[facility] EMERGENCY DEPARTMENT Provider Note   CSN: [id number] Arrival date & time: 02/01/19  0347     History Chief Complaint  Patient presents with  . Sickle Cell Pain Crisis    Paul Campos is a 28 y.o. male.  HPI     28yo male with history of sickle cell anemia presents with concern for sickle cell pain.  Last admission/acute ED care was 9/16.  Seeing Dr. Doreene Burke, called office 2 days ago to set up virtual visit and request refills.  Left lower extremity pain and back pain began last night. Feels similar to prior sickle cell pain crises. No hx of DVT/PE. Does at times have asymmetric pain and back pain as he has today.  Tried ibuprofen with moderate relief at home, last dose 10PM last night. Is out of his oxycodone rx which he uses prn for pain.  Has itching, rash with dilaudid but tolerates morphine.  Believes the cold weather may be bringing on crisis. Denies other symptoms or infectious symptoms including no cough, dyspnea, chest pain or fevers. No numbness/weakness/loss of control of bowel or bladder. No trauma.   Past Medical History:  Diagnosis Date  . Sickle cell anemia [facility])     Patient Active Problem List   Diagnosis Date Noted  . Nausea & vomiting 11/05/2018  . Sickle cell anemia with crisis ([place]) 11/04/2018  . 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 Hypokalemia 08/21/2012  . Thrombocytopenia, unspecified ([place]) 08/19/2012  . CAP (community acquired pneumonia) 08/18/2012  . Sickle cell pain crisis ([place]) 05/06/2012  . Chest pain 05/06/2012    Past Surgical History:  Procedure Laterality Date  . APPENDECTOMY         Family History  Problem Relation Age of Onset  . Lung cancer Mother     Social History   Tobacco Use  . Smoking status: Never Smoker  . Smokeless tobacco: Never Used  Substance Use Topics  . Alcohol use: No  . Drug use: No    Home Medications Prior to Admission medications   Medication Sig Start Date End Date  Taking? Authorizing Provider  hydroxyurea (HYDREA) 500 MG capsule Take 500 mg by mouth daily. May take with food to minimize GI side effects.   Yes [provider]  folic acid (FOLVITE) 1 MG tablet Take 1 tablet (1 mg total) by mouth daily. 07/27/18   Lanae Boast, FNP  ibuprofen (ADVIL) 800 MG tablet Take 1 tablet (800 mg total) by mouth every 8 (eight) hours as needed. 01/08/19   Tresa Garter, MD  Oxycodone HCl 20 MG TABS Take 1 tablet (20 mg total) by mouth every 6 (six) hours as needed. 02/01/19   Dorena Dew, FNP    Allergies    Dilaudid [hydromorphone hcl], Hydromorphone, and Cefotaxime  Review of Systems   Review of Systems  Constitutional: Negative for fever.  HENT: Negative for sore throat.   Eyes: Negative for visual disturbance.  Respiratory: Negative for cough and shortness of breath.   Cardiovascular: Negative for chest pain.  Gastrointestinal: Negative for abdominal pain, nausea and vomiting.  Genitourinary: Negative for difficulty urinating.  Musculoskeletal: Positive for arthralgias and myalgias. Negative for back pain and neck stiffness.  Skin: Negative for rash.  Neurological: Negative for syncope and headaches.    Physical Exam Updated Vital Signs BP 112/73   Pulse (!) 52   Temp 98.4 F (36.9 C) (Oral)   Resp 14   Ht 6' (1.829 m)  Wt 83.9 kg   SpO2 100%   BMI 25.09 kg/m   Physical Exam Vitals and nursing note reviewed.  Constitutional:      General: He is not in acute distress.    Appearance: He is well-developed. He is not diaphoretic.     Comments: Appears uncomfortable  HENT:     Head: Normocephalic and atraumatic.  Eyes:     Conjunctiva/sclera: Conjunctivae normal.  Cardiovascular:     Rate and Rhythm: Normal rate and regular rhythm.     Pulses: Normal pulses.  Pulmonary:     Effort: Pulmonary effort is normal. No respiratory distress.  Abdominal:     General: There is no distension.     Palpations: Abdomen is soft.      Tenderness: There is no abdominal tenderness. There is no guarding.  Musculoskeletal:     Cervical back: Normal range of motion.     Comments: Pain with ROM and strength testing of LLE, however full ROM knee/hip  Skin:    General: Skin is warm and dry.  Neurological:     Mental Status: He is alert and oriented to person, place, and time.     Comments: 5/5 strength LE, normal sensation      ED Results / Procedures / Treatments   Labs (all labs ordered are listed, but only abnormal results are displayed) Labs Reviewed  CBC WITH DIFFERENTIAL/PLATELET - Abnormal; Notable for the following components:      Result Value   WBC 13.5 (*)    Hemoglobin 11.4 (*)    HCT 33.6 (*)    MCV 76.0 (*)    MCH 25.8 (*)    RDW 15.9 (*)    nRBC 0.3 (*)    Monocytes Absolute 2.2 (*)    Eosinophils Absolute 0.8 (*)    Abs Immature Granulocytes 0.09 (*)    All other components within normal limits  RETICULOCYTES - Abnormal; Notable for the following components:   Retic Ct Pct 6.0 (*)    Retic Count, Absolute 266.4 (*)    Immature Retic Fract 41.4 (*)    All other components within normal limits  COMPREHENSIVE METABOLIC PANEL - Abnormal; Notable for the following components:   Glucose, Bld 108 (*)    Total Bilirubin 1.5 (*)    All other components within normal limits    EKG None  Radiology No results found.  Procedures Procedures (including critical care time)  Medications Ordered in ED Medications  ketorolac (TORADOL) 30 MG/ML injection 30 mg (30 mg Intravenous Given 02/01/19 0904)  morphine 4 MG/ML injection 8 mg (8 mg Intravenous Given 02/01/19 0904)  morphine 4 MG/ML injection 4 mg (4 mg Intravenous Given 02/01/19 1001)  diphenhydrAMINE (BENADRYL) capsule 25 mg (25 mg Oral Given 02/01/19 1000)    ED Course  I have reviewed the triage vital signs and the nursing notes.  Pertinent labs & imaging results that were available during my care of the patient were reviewed by me and  considered in my medical decision making (see chart for details).    MDM Rules/Calculators/A&P    Impression(s) / ED Diagnoses Final diagnoses:  Sickle cell pain crisis [facility](HCC)    27yo male with history of sickle cell anemia presents with concern for sickle cell pain with back and left leg pain.  No asymmetric swelling, given combination of areas of pain and similarity to prior pain crises, have low suspicion for DVT as etiology of pain and suspect likely sickle cell pain crises. No  sign of acute arterial occlusion. No signs of cauda equina, epidural abscess or other spinal emergency.  No signs of septic arthritis or cellulitis.  No fever or acute chest symptoms.  Crisis likely brought on by weather.  Given IV fluids, morphine for pain-he is relatively opiate naive and does have pruritis and rash with dilaudid.   Discussed with [place] Hollis, inpt Sick[facility] NP.  Patient pain improved with 3 doses of morphine 8mg  x2 and 4mg  and toradol.  Hollis NP discussing with SS SW to organize evaluation for tomorrow and review records for refill. Patient discharged in stable condition with understanding of reasons to return.    Rx / DC Orders ED Discharge Orders    None       , MD 02/01/19 2054

## 2019-02-01 NOTE — ED Notes (Signed)
ED Provider at bedside discussing plan of care options with pt.

## 2019-02-02 NOTE — Progress Notes (Signed)
Pt has a appointment for Tue

## 2019-02-06 ENCOUNTER — Other Ambulatory Visit: Payer: Self-pay

## 2019-02-06 ENCOUNTER — Encounter: Payer: Self-pay | Admitting: Family Medicine

## 2019-02-06 ENCOUNTER — Ambulatory Visit (INDEPENDENT_AMBULATORY_CARE_PROVIDER_SITE_OTHER): Payer: Self-pay | Admitting: Family Medicine

## 2019-02-06 VITALS — BP 114/75 | HR 76 | Temp 98.5°F | Resp 14 | Ht 72.0 in | Wt 170.0 lb

## 2019-02-06 DIAGNOSIS — D571 Sickle-cell disease without crisis: Secondary | ICD-10-CM

## 2019-02-06 DIAGNOSIS — G44209 Tension-type headache, unspecified, not intractable: Secondary | ICD-10-CM

## 2019-02-06 DIAGNOSIS — G8929 Other chronic pain: Secondary | ICD-10-CM

## 2019-02-06 LAB — POCT URINALYSIS DIPSTICK
Bilirubin, UA: NEGATIVE
Blood, UA: NEGATIVE
Glucose, UA: NEGATIVE
Ketones, UA: NEGATIVE
Nitrite, UA: NEGATIVE
Protein, UA: POSITIVE — AB
Spec Grav, UA: 1.02 (ref 1.010–1.025)
Urobilinogen, UA: 1 E.U./dL
pH, UA: 6 (ref 5.0–8.0)

## 2019-02-06 MED ORDER — IBUPROFEN 800 MG PO TABS: 800.0000 mg | ORAL_TABLET | Freq: Three times a day (TID) | ORAL | 1 refills | Status: DC | PRN

## 2019-02-06 MED ORDER — BUTALBITAL-APAP-CAFFEINE 50-325-40 MG PO TABS: 1.0000 | ORAL_TABLET | Freq: Four times a day (QID) | ORAL | 0 refills | Status: DC | PRN

## 2019-02-06 NOTE — Progress Notes (Signed)
[facility]   Established Patient Office Visit  Subjective:  Patient ID: Paul Campos, male    DOB: 1990/03/26  Age: 28 y.o. MRN: [id number]  CC:  Chief Complaint  Patient presents with  . Sickle Cell Anemia  . Headache    x 3 weeks     HPI  Paul Shipperngelo Tech, a 28 year old male with a medical history significant for sickle cell disease, chronic pain syndrome, opiate dependence and tolerance presents for a follow up of chronic conditions.   Headache  This is a recurrent problem. The current episode started 1 to 4 weeks ago. The problem occurs constantly. The problem has been unchanged. The pain is located in the bilateral and frontal region. The pain does not radiate. The quality of the pain is described as aching. The pain is at a severity of 4/10. The pain is moderate. Associated symptoms include muscle aches. Pertinent negatives include no abnormal behavior, dizziness, hearing loss, nausea, neck pain, scalp tenderness, sinus pressure, sore throat, tingling, tinnitus or visual change. There is no history of cancer, cluster headaches, migraine headaches, migraines in the family, recent head traumas or sinus disease.    Past Medical History:  Diagnosis Date  . Sickle cell anemia (HCC)     Past Surgical History:  Procedure Laterality Date  . APPENDECTOMY      Family History  Problem Relation Age of Onset  . Lung cancer Mother     Social History   Socioeconomic History  . Marital status: Single    Spouse name: Not on file  . Number of children: Not on file  . Years of education: Not on file  . Highest education level: Not on file  Occupational History  . Not on file  Tobacco Use  . Smoking status: Never Smoker  . Smokeless tobacco: Never Used  Substance and Sexual Activity  . Alcohol use: No  . Drug use: No  . Sexual activity: Not on file  Other Topics Concern  . Not on file  Social History Narrative  . Not on file     Social Determinants of Health   Financial Resource Strain:   . Difficulty of Paying Living Expenses: Not on file  Food Insecurity:   . Worried About Programme researcher, broadcasting/film/videounning Out of Food in the Last Year: Not on file  . Ran Out of Food in the Last Year: Not on file  Transportation Needs:   . Lack of Transportation (Medical): Not on file  . Lack of Transportation (Non-Medical): Not on file  Physical Activity:   . Days of Exercise per Week: Not on file  . Minutes of Exercise per Session: Not on file  Stress:   . Feeling of Stress : Not on file  Social Connections:   . Frequency of Communication with Friends and Family: Not on file  . Frequency of Social Gatherings with Friends and Family: Not on file  . Attends Religious Services: Not on file  . Active Member of Clubs or Organizations: Not on file  . Attends BankerClub or Organization Meetings: Not on file  . Marital Status: Not on file  Intimate Partner Violence:   . Fear of Current or Ex-Partner: Not on file  . Emotionally Abused: Not on file  . Physically Abused: Not on file  . Sexually Abused: Not on file    Outpatient Medications Prior to Visit  Medication Sig Dispense Refill  . folic acid (FOLVITE) 1 MG tablet Take 1  tablet (1 mg total) by mouth daily. 30 tablet 11  . hydroxyurea (HYDREA) 500 MG capsule Take 500 mg by mouth daily. May take with food to minimize GI side effects.    . Oxycodone HCl 20 MG TABS Take 1 tablet (20 mg total) by mouth every 6 (six) hours as needed. 20 tablet 0  . ibuprofen (ADVIL) 800 MG tablet Take 1 tablet (800 mg total) by mouth every 8 (eight) hours as needed. 60 tablet 1   No facility-administered medications prior to visit.    Allergies  Allergen Reactions  . Dilaudid [Hydromorphone Hcl] Hives  . Hydromorphone Itching    Morphine okay.  . Cefotaxime Itching and Rash    Hives    ROS Review of Systems  Constitutional: Negative.   HENT: Negative.  Negative for hearing loss, sinus pressure, sore throat  and tinnitus.   Eyes: Negative.   Respiratory: Negative.  Negative for shortness of breath.   Cardiovascular: Negative.  Negative for chest pain.  Gastrointestinal: Negative.  Negative for nausea.  Genitourinary: Negative.   Musculoskeletal: Positive for arthralgias. Negative for neck pain.  Allergic/Immunologic: Negative.   Neurological: Positive for headaches. Negative for dizziness and tingling.  Hematological: Negative.   Psychiatric/Behavioral: Negative.       Objective:    Physical Exam  Constitutional: He is oriented to person, place, and time. He appears well-developed and well-nourished.  Eyes: Pupils are equal, round, and reactive to light.  Cardiovascular: Normal rate and regular rhythm.  Pulmonary/Chest: Effort normal and breath sounds normal.  Abdominal: Soft. Bowel sounds are normal.  Musculoskeletal:        General: Normal range of motion.     Cervical back: Normal range of motion.  Neurological: He is alert and oriented to person, place, and time.  Skin: Skin is warm and dry.  Psychiatric: He has a normal mood and affect. His behavior is normal. Judgment and thought content normal.    BP 114/75 (BP Location: Left Arm, Patient Position: Sitting, Cuff Size: Normal)   Pulse 76   Temp 98.5 F (36.9 C) (Oral)   Resp 14   Ht 6' (1.829 m)   Wt 170 lb (77.1 kg)   SpO2 100%   BMI 23.06 kg/m  Wt Readings from Last 3 Encounters:  02/06/19 170 lb (77.1 kg)  02/01/19 185 lb (83.9 kg)  11/04/18 180 lb (81.6 kg)     Health Maintenance Due  Topic Date Due  . TETANUS/TDAP  02/07/2011    There are no preventive care reminders to display for this patient.  No results found for: TSH Lab Results  Component Value Date   WBC 13.5 (H) 02/01/2019   HGB 11.4 (L) 02/01/2019   HCT 33.6 (L) 02/01/2019   MCV 76.0 (L) 02/01/2019   PLT 245 02/01/2019   Lab Results  Component Value Date   NA 140 02/01/2019   K 4.3 02/01/2019   CO2 27 02/01/2019   GLUCOSE 108 (H)  02/01/2019   BUN 7 02/01/2019   CREATININE 0.82 02/01/2019   BILITOT 1.5 (H) 02/01/2019   ALKPHOS 73 02/01/2019   AST 18 02/01/2019   ALT 14 02/01/2019   PROT 7.4 02/01/2019   ALBUMIN 3.9 02/01/2019   CALCIUM 8.9 02/01/2019   ANIONGAP 7 02/01/2019   No results found for: CHOL No results found for: HDL No results found for: LDLCALC No results found for: TRIG No results found for: CHOLHDL No results found for: CZYS0Y    Assessment & Plan:  Problem List Items Addressed This Visit      Other   Hb-SS disease without crisis (HCC)   Relevant Medications   ibuprofen (ADVIL) 800 MG tablet   Other Relevant Orders   [id number] 11+Oxyco+Alc+Crt-Bund   Comprehensive metabolic panel   CBC with Differential   Reticulocytes   Urinalysis Dipstick (Completed)   Ambulatory referral to Ophthalmology    Other Visit Diagnoses    Other chronic pain    -  Primary   Relevant Medications   butalbital-acetaminophen-caffeine (FIORICET) 50-325-40 MG tablet   ibuprofen (ADVIL) 800 MG tablet   Acute non intractable tension-type headache       Relevant Medications   butalbital-acetaminophen-caffeine (FIORICET) 50-325-40 MG tablet   ibuprofen (ADVIL) 800 MG tablet      Meds ordered this encounter  Medications  . butalbital-acetaminophen-caffeine (FIORICET) 50-325-40 MG tablet    Sig: Take 1 tablet by mouth every 6 (six) hours as needed for headache.    Dispense:  25 tablet    Refill:  0    Order Specific Question:   Supervising Provider    Answer:   Quentin Angst [id number]  . ibuprofen (ADVIL) 800 MG tablet    Sig: Take 1 tablet (800 mg total) by mouth every 8 (eight) hours as needed.    Dispense:  60 tablet    Refill:  1    Order Specific Question:   Supervising Provider    Answer:   Quentin Angst [id number]   Other chronic pain Continue Oxycodone 20 mg every 6 hours as needed for severe pain. Review drug screen as results become available.   - [id number]  11+Oxyco+Alc+Crt-Bund   Hb-SS disease without crisis (HCC) Sickle cell disease - Continue Hydrea.  We discussed the need for good hydration, monitoring of hydration status, avoidance of heat, cold, stress, and infection triggers. We discussed the risks and benefits of Hydrea, including bone marrow suppression, the possibility of GI upset, skin ulcers, hair thinning, and teratogenicity. The patient was reminded of the need to seek medical attention of any symptoms of bleeding, anemia, or infection. Continue folic acid 1 mg daily to prevent aplastic bone marrow crises.   Pulmonary evaluation - Patient denies severe recurrent wheezes, shortness of breath with exercise, or persistent cough. If these symptoms develop, pulmonary function tests with spirometry will be ordered, and if abnormal, plan on referral to Pulmonology for further evaluation.  Cardiac - Routine screening for pulmonary hypertension is not recommended.  Eye - High risk of proliferative retinopathy. Annual eye exam with retinal exam recommended to patient.  Immunization status - He declines vaccines today.  Acute and chronic painful episodes -  We reminded [place] that all patients receiving Schedule II narcotics must be seen for follow within 2 months of prescription being requested. We reviewed the terms of our pain agreement, including the need to keep medicines in a safe locked location away from children or pets, and the need to report excess sedation or constipation, measures to avoid constipation, and policies related to early refills and stolen prescriptions. According to the  Chronic Pain Initiative program, we have reviewed details related to analgesia, adverse effects, aberrant behaviors.    - Comprehensive metabolic panel - CBC with Differential - Reticulocytes - Urinalysis Dipstick - Ambulatory referral to Ophthalmology - ibuprofen (ADVIL) 800 MG tablet; Take 1 tablet (800 mg total) by mouth every 8 (eight) hours as  needed.  Dispense: 60 tablet; Refill: 1  Acute non intractable tension-type headache - butalbital-acetaminophen-caffeine (FIORICET)  50-325-40 MG tablet; Take 1 tablet by mouth every 6 (six) hours as needed for headache.  Dispense: 25 tablet; Refill: 0   Follow-up: Return in about 2 months (around 04/09/2019) for sickle cell anemia.     Donia Pounds  APRN, MSN, FNP-C [facility] [address] [place], [place] [address] [phone]

## 2019-02-06 NOTE — Patient Instructions (Addendum)
[facility] [address] [place], [place]  [phone]    Tension Headache, Adult A tension headache is pain, pressure, or aching in your head. Tension headaches can last from 30 minutes to several days. Follow these instructions at home: Managing pain  Take over-the-counter and prescription medicines only as told by your doctor.  When you have a headache, lie down in a dark, quiet room.  If told, put ice on your head and neck: ? Put ice in a plastic bag. ? Place a towel between your skin and the bag. ? Leave the ice on for 20 minutes, 2-3 times a day.  If told, put heat on the back of your neck. Do this as often as your doctor tells you to. Use the kind of heat that your doctor recommends, such as a moist heat pack or a heating pad. ? Place a towel between your skin and the heat. ? Leave the heat on for 20-30 minutes. ? Remove the heat if your skin turns bright red. Eating and drinking  Eat meals on a regular schedule.  Watch how much alcohol you drink: ? If you are a woman and are not pregnant, do not drink more than 1 drink a day. ? If you are a man, do not drink more than 2 drinks a day.  Drink enough fluid to keep your pee (urine) pale yellow.  Do not use a lot of caffeine, or stop using caffeine. Lifestyle  Get enough sleep. Get 7-9 hours of sleep each night. Or get the amount of sleep that your doctor tells you to.  At bedtime, remove all electronic devices from your room. Examples of electronic devices are computers, phones, and tablets.  Find ways to lessen your stress. Some things that can lessen stress are: ? Exercise. ? Deep breathing. ? Yoga. ? Music. ? Positive thoughts.  Sit up straight. Do not tighten (tense) your muscles.  Do not use any products that have nicotine or tobacco in them, such as cigarettes and e-cigarettes. If you need help quitting, ask your doctor. General instructions   Keep all follow-up visits as told  by your doctor. This is important.  Avoid things that can bring on headaches. Keep a journal to find out if certain things bring on headaches. For example, write down: ? What you eat and drink. ? How much sleep you get. ? Any change to your diet or medicines. Contact a doctor if:  Your headache does not get better.  Your headache comes back.  You have a headache and sounds, light, or smells bother you.  You feel sick to your stomach (nauseous) or you throw up (vomit).  Your stomach hurts. Get help right away if:  You suddenly get a very bad headache along with any of these: ? A stiff neck. ? Feeling sick to your stomach. ? Throwing up. ? Feeling weak. ? Trouble seeing. ? Feeling short of breath. ? A rash. ? Feeling unusually sleepy. ? Trouble speaking. ? Pain in your eye or ear. ? Trouble walking or balancing. ? Feeling like you will pass out (faint). ? Passing out. Summary  A tension headache is pain, pressure, or aching in your head.  Tension headaches can last from 30 minutes to several days.  Lifestyle changes and medicines may help relieve pain. This information is not intended to replace advice given to you by your health care provider. Make sure you discuss any questions you have with your health care provider.  Document Released: 05/05/2009 Document Revised: 01/21/2017 Document Reviewed: 05/21/2016 Elsevier Patient Education  2020 [employer].

## 2019-02-07 LAB — CBC WITH DIFFERENTIAL/PLATELET
Basophils Absolute: 0.1 10*3/uL (ref 0.0–0.2)
Basos: 1 %
EOS (ABSOLUTE): 0.4 10*3/uL (ref 0.0–0.4)
Eos: 4 %
Hematocrit: 36.5 % — ABNORMAL LOW (ref 37.5–51.0)
Hemoglobin: 11.6 g/dL — ABNORMAL LOW (ref 13.0–17.7)
Immature Grans (Abs): 0.1 10*3/uL (ref 0.0–0.1)
Immature Granulocytes: 1 %
Lymphocytes Absolute: 2.7 10*3/uL (ref 0.7–3.1)
Lymphs: 27 %
MCH: 26 pg — ABNORMAL LOW (ref 26.6–33.0)
MCHC: 31.8 g/dL (ref 31.5–35.7)
MCV: 82 fL (ref 79–97)
Monocytes Absolute: 1.5 10*3/uL — ABNORMAL HIGH (ref 0.1–0.9)
Monocytes: 15 %
NRBC: 1 % — ABNORMAL HIGH (ref 0–0)
Neutrophils Absolute: 5.3 10*3/uL (ref 1.4–7.0)
Neutrophils: 52 %
Platelets: 232 10*3/uL (ref 150–450)
RBC: 4.46 x10E6/uL (ref 4.14–5.80)
RDW: 19.4 % — ABNORMAL HIGH (ref 11.6–15.4)
WBC: 10.1 10*3/uL (ref 3.4–10.8)

## 2019-02-07 LAB — COMPREHENSIVE METABOLIC PANEL
ALT: 9 IU/L (ref 0–44)
AST: 16 IU/L (ref 0–40)
Albumin/Globulin Ratio: 1.3 (ref 1.2–2.2)
Albumin: 4.1 g/dL (ref 4.1–5.2)
Alkaline Phosphatase: 80 IU/L (ref 39–117)
BUN/Creatinine Ratio: 7 — ABNORMAL LOW (ref 9–20)
BUN: 6 mg/dL (ref 6–20)
Bilirubin Total: 0.7 mg/dL (ref 0.0–1.2)
CO2: 22 mmol/L (ref 20–29)
Calcium: 9.3 mg/dL (ref 8.7–10.2)
Chloride: 104 mmol/L (ref 96–106)
Creatinine, Ser: 0.9 mg/dL (ref 0.76–1.27)
GFR calc Af Amer: 136 mL/min/{1.73_m2} (ref >59)
GFR calc non Af Amer: 117 mL/min/{1.73_m2} (ref >59)
Globulin, Total: 3.1 g/dL (ref 1.5–4.5)
Glucose: 82 mg/dL (ref 65–99)
Potassium: 4.6 mmol/L (ref 3.5–5.2)
Sodium: 142 mmol/L (ref 134–144)
Total Protein: 7.2 g/dL (ref 6.0–8.5)

## 2019-02-07 LAB — RETICULOCYTES: Retic Ct Pct: 6.3 % — ABNORMAL HIGH (ref 0.6–2.6)

## 2019-02-09 LAB — DRUG SCREEN 764883 11+OXYCO+ALC+CRT-BUND
Amphetamines, Urine: NEGATIVE ng/mL
BENZODIAZ UR QL: NEGATIVE ng/mL
Barbiturate: NEGATIVE ng/mL
Cocaine (Metabolite): NEGATIVE ng/mL
Creatinine: 170.2 mg/dL (ref 20.0–300.0)
Ethanol: NEGATIVE %
Meperidine: NEGATIVE ng/mL
Methadone Screen, Urine: NEGATIVE ng/mL
OPIATE SCREEN URINE: NEGATIVE ng/mL
Oxycodone/Oxymorphone, Urine: NEGATIVE ng/mL
Phencyclidine: NEGATIVE ng/mL
Propoxyphene: NEGATIVE ng/mL
Tramadol: NEGATIVE ng/mL
pH, Urine: 5.9 (ref 4.5–8.9)

## 2019-02-09 LAB — CANNABINOID CONFIRMATION, UR
CANNABINOIDS: POSITIVE — AB
Carboxy THC GC/MS Conf: 216 ng/mL

## 2019-02-12 ENCOUNTER — Other Ambulatory Visit: Payer: Self-pay

## 2019-02-12 DIAGNOSIS — G8929 Other chronic pain: Secondary | ICD-10-CM

## 2019-02-12 DIAGNOSIS — D5700 Hb-SS disease with crisis, unspecified: Secondary | ICD-10-CM

## 2019-02-13 ENCOUNTER — Other Ambulatory Visit: Payer: Self-pay

## 2019-02-13 DIAGNOSIS — G8929 Other chronic pain: Secondary | ICD-10-CM

## 2019-02-13 DIAGNOSIS — D5700 Hb-SS disease with crisis, unspecified: Secondary | ICD-10-CM

## 2019-02-14 ENCOUNTER — Other Ambulatory Visit: Payer: Self-pay | Admitting: Family Medicine

## 2019-02-14 DIAGNOSIS — D5700 Hb-SS disease with crisis, unspecified: Secondary | ICD-10-CM

## 2019-02-14 DIAGNOSIS — G8929 Other chronic pain: Secondary | ICD-10-CM

## 2019-02-14 MED ORDER — OXYCODONE HCL 20 MG PO TABS: 20.0000 mg | ORAL_TABLET | Freq: Four times a day (QID) | ORAL | 0 refills | Status: AC | PRN

## 2019-02-14 NOTE — Progress Notes (Signed)
Reviewed PDMP substance reporting system prior to prescribing opiate medications. No inconsistencies noted.  °Meds ordered this encounter  °Medications  ° Oxycodone HCl 20 MG TABS  °  Sig: Take 1 tablet (20 mg total) by mouth every 6 (six) hours as needed for up to 15 days.  °  Dispense:  60 tablet  °  Refill:  0  °  Order Specific Question:   Supervising Provider  °  Answer:   [name], [name] [[id number]]  ° Arloa Prak [name] Zula Hovsepian  APRN, MSN, FNP-C °[facility] °[facility] °[address]  °[place], [place] [address] °[phone] ° °

## 2019-02-14 NOTE — Telephone Encounter (Signed)
Please fill if appropriate.  

## 2019-03-09 ENCOUNTER — Telehealth: Payer: Self-pay

## 2019-03-09 ENCOUNTER — Other Ambulatory Visit: Payer: Self-pay | Admitting: Family Medicine

## 2019-03-09 DIAGNOSIS — G8929 Other chronic pain: Secondary | ICD-10-CM

## 2019-03-09 DIAGNOSIS — D571 Sickle-cell disease without crisis: Secondary | ICD-10-CM

## 2019-03-09 DIAGNOSIS — G44209 Tension-type headache, unspecified, not intractable: Secondary | ICD-10-CM

## 2019-03-09 MED ORDER — BUTALBITAL-APAP-CAFFEINE 50-325-40 MG PO TABS: 1.0000 | ORAL_TABLET | Freq: Four times a day (QID) | ORAL | 0 refills | Status: DC | PRN

## 2019-03-09 MED ORDER — OXYCODONE HCL 20 MG PO TABS: 20.0000 mg | ORAL_TABLET | Freq: Four times a day (QID) | ORAL | 0 refills | Status: DC | PRN

## 2019-03-09 NOTE — Progress Notes (Signed)
Reviewed PDMP substance reporting system prior to prescribing opiate medications. No inconsistencies noted.   Meds ordered this encounter  Medications   Oxycodone HCl 20 MG TABS    Sig: Take 1 tablet (20 mg total) by mouth every 6 (six) hours as needed.    Dispense:  60 tablet    Refill:  0    Order Specific Question:   Supervising Provider    Answer:   [name], [name] [[id number]]      Melbert Botelho [name] Jalin Erpelding  APRN, MSN, FNP-C [facility] [address]  [place], [place] [address] [phone]  

## 2019-03-09 NOTE — Progress Notes (Signed)
Meds ordered this encounter  Medications  . butalbital-acetaminophen-caffeine (FIORICET) 50-325-40 MG tablet    Sig: Take 1 tablet by mouth every 6 (six) hours as needed for headache.    Dispense:  30 tablet    Refill:  0    Order Specific Question:   Supervising Provider    Answer:   Quentin Angst [[id number]]     Nolon Nations  APRN, MSN, FNP-C [facility] [address] [place], [place] [address] [phone]

## 2019-03-09 NOTE — Telephone Encounter (Signed)
Refill request for oxycodone and fioricet. Please advise if these can be refilled. Thanks!

## 2019-03-16 ENCOUNTER — Telehealth: Payer: Self-pay | Admitting: Family Medicine

## 2019-03-16 ENCOUNTER — Other Ambulatory Visit: Payer: Self-pay | Admitting: Family Medicine

## 2019-03-16 MED ORDER — NALOXONE HCL 0.4 MG/ML IJ SOLN
0.40 | INTRAMUSCULAR | Status: DC
Start: ? — End: 2019-03-16

## 2019-03-16 MED ORDER — IBUPROFEN 200 MG PO TABS
800.00 | ORAL_TABLET | ORAL | Status: DC
Start: 2019-03-16 — End: 2019-03-16

## 2019-03-16 MED ORDER — GENERIC EXTERNAL MEDICATION
Status: DC
Start: ? — End: 2019-03-16

## 2019-03-16 MED ORDER — NALOXONE HCL 0.4 MG/ML IJ SOLN
80.00 | INTRAMUSCULAR | Status: DC
Start: ? — End: 2019-03-16

## 2019-03-16 MED ORDER — BUTALBITAL-APAP-CAFFEINE 50-325-40 MG PO TABS
1.00 | ORAL_TABLET | ORAL | Status: DC
Start: ? — End: 2019-03-16

## 2019-03-16 MED ORDER — SODIUM CHLORIDE 0.9 % IV SOLN
100.00 | INTRAVENOUS | Status: DC
Start: ? — End: 2019-03-16

## 2019-03-16 MED ORDER — FOLIC ACID 1 MG PO TABS
1.00 | ORAL_TABLET | ORAL | Status: DC
Start: 2019-03-17 — End: 2019-03-16

## 2019-03-16 MED ORDER — DIPHENHYDRAMINE HCL 50 MG PO CAPS
50.00 | ORAL_CAPSULE | ORAL | Status: DC
Start: ? — End: 2019-03-16

## 2019-03-16 MED ORDER — HYDROXYUREA 500 MG PO CAPS
1000.00 | ORAL_CAPSULE | ORAL | Status: DC
Start: 2019-03-17 — End: 2019-03-16

## 2019-03-16 MED ORDER — OXYCODONE HCL 5 MG PO TABS
20.00 | ORAL_TABLET | ORAL | Status: DC
Start: ? — End: 2019-03-16

## 2019-03-18 ENCOUNTER — Other Ambulatory Visit: Payer: Self-pay

## 2019-03-18 DIAGNOSIS — D571 Sickle-cell disease without crisis: Secondary | ICD-10-CM

## 2019-03-18 DIAGNOSIS — G8929 Other chronic pain: Secondary | ICD-10-CM

## 2019-03-19 ENCOUNTER — Other Ambulatory Visit: Payer: Self-pay | Admitting: Family Medicine

## 2019-03-19 DIAGNOSIS — G8929 Other chronic pain: Secondary | ICD-10-CM

## 2019-03-19 DIAGNOSIS — D571 Sickle-cell disease without crisis: Secondary | ICD-10-CM

## 2019-03-19 MED ORDER — OXYCODONE HCL 20 MG PO TABS: 20.0000 mg | ORAL_TABLET | Freq: Four times a day (QID) | ORAL | 0 refills | Status: DC | PRN

## 2019-03-19 NOTE — Progress Notes (Signed)
Reviewed PDMP substance reporting system prior to prescribing opiate medications. No inconsistencies noted.   Meds ordered this encounter  Medications   Oxycodone HCl 20 MG TABS    Sig: Take 1 tablet (20 mg total) by mouth every 6 (six) hours as needed.    Dispense:  60 tablet    Refill:  0    Order Specific Question:   Supervising Provider    Answer:   [name], [name] [[id number]]      Paul Horst [name] Darcy Cordner  APRN, MSN, FNP-C [facility] [address]  [place], [place] [address] [phone]  

## 2019-03-19 NOTE — Telephone Encounter (Signed)
Done

## 2019-03-19 NOTE — Telephone Encounter (Signed)
Refill request for oxycodone. Please advise.  

## 2019-04-10 ENCOUNTER — Ambulatory Visit (INDEPENDENT_AMBULATORY_CARE_PROVIDER_SITE_OTHER): Payer: Self-pay | Admitting: Family Medicine

## 2019-04-10 ENCOUNTER — Encounter: Payer: Self-pay | Admitting: Family Medicine

## 2019-04-10 ENCOUNTER — Other Ambulatory Visit: Payer: Self-pay

## 2019-04-10 VITALS — BP 120/85 | HR 73 | Temp 98.8°F | Resp 14 | Ht 72.0 in | Wt 175.0 lb

## 2019-04-10 DIAGNOSIS — G44209 Tension-type headache, unspecified, not intractable: Secondary | ICD-10-CM

## 2019-04-10 DIAGNOSIS — G8929 Other chronic pain: Secondary | ICD-10-CM

## 2019-04-10 DIAGNOSIS — D571 Sickle-cell disease without crisis: Secondary | ICD-10-CM

## 2019-04-10 LAB — POCT URINALYSIS DIPSTICK
Bilirubin, UA: NEGATIVE
Blood, UA: NEGATIVE
Glucose, UA: NEGATIVE
Ketones, UA: NEGATIVE
Leukocytes, UA: NEGATIVE
Nitrite, UA: NEGATIVE
Protein, UA: NEGATIVE
Spec Grav, UA: 1.02 (ref 1.010–1.025)
Urobilinogen, UA: 0.2 E.U./dL
pH, UA: 6.5 (ref 5.0–8.0)

## 2019-04-10 MED ORDER — VOXELOTOR 500 MG PO TABS: 1000.0000 mg | ORAL_TABLET | Freq: Every day | ORAL | 1 refills | Status: DC

## 2019-04-10 MED ORDER — BUTALBITAL-APAP-CAFFEINE 50-325-40 MG PO TABS: 1.0000 | ORAL_TABLET | Freq: Four times a day (QID) | ORAL | 2 refills | Status: DC | PRN

## 2019-04-10 NOTE — Patient Instructions (Addendum)
COVID-19 Vaccine Information can be found at: [email or web address] For questions related to vaccine distribution or appointments, please email [email or web address] or call [phone].    Sickle Cell Anemia, Adult  Sickle cell anemia is a condition where your red blood cells are shaped like sickles. Red blood cells carry oxygen through the body. Sickle-shaped cells do not live as long as normal red blood cells. They also clump together and block blood from flowing through the blood vessels. This prevents the body from getting enough oxygen. Sickle cell anemia causes organ damage and pain. It also increases the risk of infection. Follow these instructions at home: Medicines  Take over-the-counter and prescription medicines only as told by your doctor.  If you were prescribed an antibiotic medicine, take it as told by your doctor. Do not stop taking the antibiotic even if you start to feel better.  If you develop a fever, do not take medicines to lower the fever right away. Tell your doctor about the fever. Managing pain, stiffness, and swelling  Try these methods to help with pain: ? Use a heating pad. ? Take a warm bath. ? Distract yourself, such as by watching TV. Eating and drinking  Drink enough fluid to keep your pee (urine) clear or pale yellow. Drink more in hot weather and during exercise.  Limit or avoid alcohol.  Eat a healthy diet. Eat plenty of fruits, vegetables, whole grains, and lean protein.  Take vitamins and supplements as told by your doctor. Traveling  When traveling, keep these with you: ? Your medical information. ? The names of your doctors. ? Your medicines.  If you need to take an airplane, talk to your doctor first. Activity  Rest often.  Avoid exercises that make your heart beat much faster, such as jogging. General instructions  Do not use products that have nicotine or tobacco, such as  cigarettes and e-cigarettes. If you need help quitting, ask your doctor.  Consider wearing a medical alert bracelet.  Avoid being in high places (high altitudes), such as mountains.  Avoid very hot or cold temperatures.  Avoid places where the temperature changes a lot.  Keep all follow-up visits as told by your doctor. This is important. Contact a doctor if:  A joint hurts.  Your feet or hands hurt or swell.  You feel tired (fatigued). Get help right away if:  You have symptoms of infection. These include: ? Fever. ? Chills. ? Being very tired. ? Irritability. ? Poor eating. ? Throwing up (vomiting).  You feel dizzy or faint.  You have new stomach pain, especially on the left side.  You have a an erection (priapism) that lasts more than 4 hours.  You have numbness in your arms or legs.  You have a hard time moving your arms or legs.  You have trouble talking.  You have pain that does not go away when you take medicine.  You are short of breath.  You are breathing fast.  You have a long-term cough.  You have pain in your chest.  You have a bad headache.  You have a stiff neck.  Your stomach looks bloated even though you did not eat much.  Your skin is pale.  You suddenly cannot see well. Summary  Sickle cell anemia is a condition where your red blood cells are shaped like sickles.  Follow your doctor's advice on ways to manage pain, food to eat, activities to do, and steps to take for safe travel.  Get medical help   right away if you have any signs of infection, such as a fever. This information is not intended to replace advice given to you by your health care provider. Make sure you discuss any questions you have with your health care provider. Document Revised: 06/02/2018 Document Reviewed: 03/16/2016 Elsevier Patient Education  2020 Elsevier Inc.  

## 2019-04-10 NOTE — Progress Notes (Signed)
[facility]  Established Patient Office Visit  Subjective:  Patient ID: Paul Campos, male    DOB: 01-17-1991  Age: 29 y.o. MRN: [id number]  CC:  Chief Complaint  Patient presents with  . Sickle Cell Anemia    HPI Paul Campos is a 29 year old male with a medical history significant for sickle cell disease, chronic pain syndrome, opiate dependence and tolerance, history of migraine headaches presents for follow-up of chronic conditions.  Patient states that he has been doing well and has minimal complaints.  Patient mainly has pain to low back and lower extremities that is consistent with his pain related to sickle cell disease.  Patient is currently not followed by hematology.  He takes folic acid and hydroxyurea daily.  His current pain intensity is 6/10 characterized as intermittent and aching.  He says the pain is somewhat managed on oxycodone, he feels he said it is not lasting entire 6 hours.  Pain is typically exacerbated with increased activity.  Patient denies current headache, chest pain, shortness of breath, fever, chills, urinary symptoms, nausea, vomiting, or diarrhea.  Patient has not had any sick contacts.  He also denies any exposure to someone positive for COVID-19 infection. Past Medical History:  Diagnosis Date  . Sickle cell anemia (HCC)     Past Surgical History:  Procedure Laterality Date  . APPENDECTOMY      Family History  Problem Relation Age of Onset  . Lung cancer Mother     Social History   Socioeconomic History  . Marital status: Single    Spouse name: Not on file  . Number of children: Not on file  . Years of education: Not on file  . Highest education level: Not on file  Occupational History  . Not on file  Tobacco Use  . Smoking status: Never Smoker  . Smokeless tobacco: Never Used  Substance and Sexual Activity  . Alcohol use: No  . Drug use: No  . Sexual activity: Not on file  Other  Topics Concern  . Not on file  Social History Narrative  . Not on file   Social Determinants of Health   Financial Resource Strain:   . Difficulty of Paying Living Expenses: Not on file  Food Insecurity:   . Worried About Programme researcher, broadcasting/film/video in the Last Year: Not on file  . Ran Out of Food in the Last Year: Not on file  Transportation Needs:   . Lack of Transportation (Medical): Not on file  . Lack of Transportation (Non-Medical): Not on file  Physical Activity:   . Days of Exercise per Week: Not on file  . Minutes of Exercise per Session: Not on file  Stress:   . Feeling of Stress : Not on file  Social Connections:   . Frequency of Communication with Friends and Family: Not on file  . Frequency of Social Gatherings with Friends and Family: Not on file  . Attends Religious Services: Not on file  . Active Member of Clubs or Organizations: Not on file  . Attends Banker Meetings: Not on file  . Marital Status: Not on file  Intimate Partner Violence:   . Fear of Current or Ex-Partner: Not on file  . Emotionally Abused: Not on file  . Physically Abused: Not on file  . Sexually Abused: Not on file    Outpatient Medications Prior to Visit  Medication Sig Dispense Refill  . butalbital-acetaminophen-caffeine (FIORICET) [phone]  MG tablet Take 1 tablet by mouth every 6 (six) hours as needed for headache. 30 tablet 0  . folic acid (FOLVITE) 1 MG tablet Take 1 tablet (1 mg total) by mouth daily. 30 tablet 11  . hydroxyurea (HYDREA) 500 MG capsule Take 500 mg by mouth daily. May take with food to minimize GI side effects.    Marland Kitchen ibuprofen (ADVIL) 800 MG tablet Take 1 tablet (800 mg total) by mouth every 8 (eight) hours as needed. 60 tablet 1  . Oxycodone HCl 20 MG TABS Take 1 tablet (20 mg total) by mouth every 6 (six) hours as needed. 60 tablet 0   No facility-administered medications prior to visit.    Allergies  Allergen Reactions  . Dilaudid [Hydromorphone Hcl]  Hives  . Hydromorphone Itching    Morphine okay.  . Cefotaxime Itching and Rash    Hives    ROS Review of Systems  Constitutional: Negative for activity change and appetite change.  HENT: Negative.   Cardiovascular: Negative.   Endocrine: Negative.   Genitourinary: Negative.   Musculoskeletal: Positive for arthralgias and back pain.  Allergic/Immunologic: Negative.   Neurological: Negative.   Hematological: Negative.   Psychiatric/Behavioral: Negative.       Objective:    Physical Exam  Constitutional: He is oriented to person, place, and time. He appears well-developed.  HENT:  Head: Normocephalic and atraumatic.  Eyes: Pupils are equal, round, and reactive to light.  Cardiovascular: Normal rate.  Pulmonary/Chest: Effort normal and breath sounds normal.  Abdominal: Soft.  Musculoskeletal:        General: Normal range of motion.     Cervical back: Normal range of motion.  Neurological: He is alert and oriented to person, place, and time.  Skin: Skin is warm and dry.  Psychiatric: He has a normal mood and affect. His behavior is normal. Judgment and thought content normal.    BP 120/85 (BP Location: Left Arm, Patient Position: Sitting, Cuff Size: Normal)   Pulse 73   Temp 98.8 F (37.1 C) (Oral)   Resp 14   Ht 6' (1.829 m)   Wt 175 lb (79.4 kg)   SpO2 100%   BMI 23.73 kg/m  Wt Readings from Last 3 Encounters:  04/10/19 175 lb (79.4 kg)  02/06/19 170 lb (77.1 kg)  02/01/19 185 lb (83.9 kg)     There are no preventive care reminders to display for this patient.  There are no preventive care reminders to display for this patient.  No results found for: TSH Lab Results  Component Value Date   WBC 10.1 02/06/2019   HGB 11.6 (L) 02/06/2019   HCT 36.5 (L) 02/06/2019   MCV 82 02/06/2019   PLT 232 02/06/2019   Lab Results  Component Value Date   NA 142 02/06/2019   K 4.6 02/06/2019   CO2 22 02/06/2019   GLUCOSE 82 02/06/2019   BUN 6 02/06/2019    CREATININE 0.90 02/06/2019   BILITOT 0.7 02/06/2019   ALKPHOS 80 02/06/2019   AST 16 02/06/2019   ALT 9 02/06/2019   PROT 7.2 02/06/2019   ALBUMIN 4.1 02/06/2019   CALCIUM 9.3 02/06/2019   ANIONGAP 7 02/01/2019   No results found for: CHOL No results found for: HDL No results found for: LDLCALC No results found for: TRIG No results found for: CHOLHDL No results found for: HGBA1C    Assessment & Plan:   Problem List Items Addressed This Visit      Other  Hb-SS disease without crisis (HCC)   Relevant Medications   voxelotor (OXBRYTA) 500 MG TABS tablet   Other Relevant Orders   CBC with Differential   CMP and Liver   Urinalysis Dipstick (Completed)    Other Visit Diagnoses    Other chronic pain    -  Primary   Relevant Orders   [id number] 11+Oxyco+Alc+Crt-Bund      Meds ordered this encounter  Medications  . voxelotor (OXBRYTA) 500 MG TABS tablet    Sig: Take 1,000 mg by mouth daily.    Dispense:  90 tablet    Refill:  1    Order Specific Question:   Supervising Provider    Answer:   Jeanann Lewandowsky E [id number]  1. Other chronic pain - 867619 11+Oxyco+Alc+Crt-Bund - Cannabinoid Conf, Ur  2. Hb-SS disease without crisis (HCC) Sickle cell disease - Continue Hydrea. We discussed the need for good hydration, monitoring of hydration status, avoidance of heat, cold, stress, and infection triggers. We discussed the risks and benefits of Hydrea, including bone marrow suppression, the possibility of GI upset, skin ulcers, hair thinning, and teratogenicity. The patient was reminded of the need to seek medical attention of any symptoms of bleeding, anemia, or infection. Continue folic acid 1 mg daily to prevent aplastic bone marrow crises. Patient and I discussed starting Oxybryta at length. Patient also given written information.   Pulmonary evaluation - Patient denies severe recurrent wheezes, shortness of breath with exercise, or persistent cough. If these symptoms develop,  pulmonary function tests with spirometry will be ordered, and if abnormal, plan on referral to Pulmonology for further evaluation.  Cardiac - Routine screening for pulmonary hypertension is not recommended.  Eye - High risk of proliferative retinopathy. Annual eye exam with retinal exam recommended to patient.  5. Immunization status - Paul Campos and I discussed vaccinations. He is interested in COVID 19 vaccination when it becomes available.   6. Acute and chronic painful episodes -  Will increase frequency of Oxycodone to every 4 hours for greater pain control.   - CBC with Differential - CMP and Liver - Urinalysis Dipstick - voxelotor (OXBRYTA) 500 MG TABS tablet; Take 1,000 mg by mouth daily.  Dispense: 90 tablet; Refill: 1  3. Acute non intractable tension-type headach - butalbital-acetaminophen-caffeine (FIORICET) 50-325-40 MG tablet; Take 1 tablet by mouth every 6 (six) hours as needed for headache.  Dispense: 30 tablet; Refill: 2  Follow-up: Return in about 3 months (around 07/08/2019).   Nolon Nations  APRN, MSN, FNP-C Patient Care [facility] [address] [place], [place] [address] [phone]

## 2019-04-11 ENCOUNTER — Other Ambulatory Visit: Payer: Self-pay

## 2019-04-11 ENCOUNTER — Telehealth: Payer: Self-pay

## 2019-04-11 ENCOUNTER — Other Ambulatory Visit: Payer: Self-pay | Admitting: Family Medicine

## 2019-04-11 DIAGNOSIS — D571 Sickle-cell disease without crisis: Secondary | ICD-10-CM

## 2019-04-11 DIAGNOSIS — G8929 Other chronic pain: Secondary | ICD-10-CM

## 2019-04-11 LAB — CBC WITH DIFFERENTIAL/PLATELET
Basophils Absolute: 0.1 10*3/uL (ref 0.0–0.2)
Basos: 1 %
EOS (ABSOLUTE): 0.5 10*3/uL — ABNORMAL HIGH (ref 0.0–0.4)
Eos: 6 %
Hematocrit: 35.9 % — ABNORMAL LOW (ref 37.5–51.0)
Hemoglobin: 12 g/dL — ABNORMAL LOW (ref 13.0–17.7)
Immature Grans (Abs): 0 10*3/uL (ref 0.0–0.1)
Immature Granulocytes: 0 %
Lymphocytes Absolute: 2.1 10*3/uL (ref 0.7–3.1)
Lymphs: 27 %
MCH: 26.3 pg — ABNORMAL LOW (ref 26.6–33.0)
MCHC: 33.4 g/dL (ref 31.5–35.7)
MCV: 79 fL (ref 79–97)
Monocytes Absolute: 1.7 10*3/uL — ABNORMAL HIGH (ref 0.1–0.9)
Monocytes: 22 %
NRBC: 1 % — ABNORMAL HIGH (ref 0–0)
Neutrophils Absolute: 3.5 10*3/uL (ref 1.4–7.0)
Neutrophils: 44 %
Platelets: 169 10*3/uL (ref 150–450)
RBC: 4.56 x10E6/uL (ref 4.14–5.80)
RDW: 17.2 % — ABNORMAL HIGH (ref 11.6–15.4)
WBC: 7.9 10*3/uL (ref 3.4–10.8)

## 2019-04-11 LAB — CMP AND LIVER
ALT: 11 IU/L (ref 0–44)
AST: 20 IU/L (ref 0–40)
Albumin: 4.4 g/dL (ref 4.1–5.2)
Alkaline Phosphatase: 75 IU/L (ref 39–117)
BUN: 4 mg/dL — ABNORMAL LOW (ref 6–20)
Bilirubin Total: 1 mg/dL (ref 0.0–1.2)
Bilirubin, Direct: 0.26 mg/dL (ref 0.00–0.40)
CO2: 22 mmol/L (ref 20–29)
Calcium: 9.2 mg/dL (ref 8.7–10.2)
Chloride: 102 mmol/L (ref 96–106)
Creatinine, Ser: 0.92 mg/dL (ref 0.76–1.27)
GFR calc Af Amer: 131 mL/min/{1.73_m2} (ref >59)
GFR calc non Af Amer: 114 mL/min/{1.73_m2} (ref >59)
Glucose: 53 mg/dL — ABNORMAL LOW (ref 65–99)
Potassium: 4.1 mmol/L (ref 3.5–5.2)
Sodium: 141 mmol/L (ref 134–144)
Total Protein: 7.3 g/dL (ref 6.0–8.5)

## 2019-04-11 MED ORDER — OXYCODONE HCL 20 MG PO TABS: 20.0000 mg | ORAL_TABLET | ORAL | 0 refills | Status: DC | PRN

## 2019-04-11 NOTE — Telephone Encounter (Signed)
Called and spoke with patient, advised that labs are consistent with baseline and no medication changes are needed at this time. Patient verbalized understanding. Thanks!

## 2019-04-11 NOTE — Telephone Encounter (Signed)
-----   Message from Massie Maroon, [place] sent at 04/11/2019  6:19 AM EST ----- Regarding: lab results Please inform patient that all labs are consistent with baseline, no medication changes warranted at this time. Follow up in office as scheduled.   Nolon Nations  APRN, MSN, FNP-C Patient Care [facility] [address] [place], [place] [address] [phone]

## 2019-04-11 NOTE — Telephone Encounter (Signed)
[place],  I believe you ordered this yesterday. It will not give me authorization to refuse or approve.

## 2019-04-11 NOTE — Progress Notes (Signed)
Reviewed PDMP substance reporting system prior to prescribing opiate medications. No inconsistencies noted.   Meds ordered this encounter  Medications  . Oxycodone HCl 20 MG TABS    Sig: Take 1 tablet (20 mg total) by mouth every 4 (four) hours as needed.    Dispense:  60 tablet    Refill:  0    Order Specific Question:   Supervising Provider    Answer:   [name], [name] [[id number]]     Paul Shiroma [name] Knoah Nedeau  APRN, MSN, FNP-C [facility] [address]  [place], [place] [address] [phone]  

## 2019-04-16 LAB — DRUG SCREEN 764883 11+OXYCO+ALC+CRT-BUND
Amphetamines, Urine: NEGATIVE ng/mL
BENZODIAZ UR QL: NEGATIVE ng/mL
Barbiturate: NEGATIVE ng/mL
Cocaine (Metabolite): NEGATIVE ng/mL
Creatinine: 106.6 mg/dL (ref 20.0–300.0)
Ethanol: NEGATIVE %
Meperidine: NEGATIVE ng/mL
Methadone Screen, Urine: NEGATIVE ng/mL
OPIATE SCREEN URINE: NEGATIVE ng/mL
Oxycodone/Oxymorphone, Urine: NEGATIVE ng/mL
Phencyclidine: NEGATIVE ng/mL
Propoxyphene: NEGATIVE ng/mL
Tramadol: NEGATIVE ng/mL
pH, Urine: 6.1 (ref 4.5–8.9)

## 2019-04-16 LAB — CANNABINOID CONFIRMATION, UR
CANNABINOIDS: POSITIVE — AB
Carboxy THC GC/MS Conf: 24 ng/mL

## 2019-04-28 ENCOUNTER — Other Ambulatory Visit: Payer: Self-pay

## 2019-04-28 DIAGNOSIS — D571 Sickle-cell disease without crisis: Secondary | ICD-10-CM

## 2019-04-28 DIAGNOSIS — G8929 Other chronic pain: Secondary | ICD-10-CM

## 2019-04-30 ENCOUNTER — Other Ambulatory Visit: Payer: Self-pay | Admitting: Family Medicine

## 2019-04-30 DIAGNOSIS — G8929 Other chronic pain: Secondary | ICD-10-CM

## 2019-04-30 DIAGNOSIS — D571 Sickle-cell disease without crisis: Secondary | ICD-10-CM

## 2019-04-30 MED ORDER — OXYCODONE HCL 20 MG PO TABS: 20.0000 mg | ORAL_TABLET | ORAL | 0 refills | Status: DC | PRN

## 2019-04-30 NOTE — Progress Notes (Signed)
Reviewed PDMP substance reporting system prior to prescribing opiate medications. No inconsistencies noted.   Meds ordered this encounter  Medications  . Oxycodone HCl 20 MG TABS    Sig: Take 1 tablet (20 mg total) by mouth every 4 (four) hours as needed.    Dispense:  60 tablet    Refill:  0    Order Specific Question:   Supervising Provider    Answer:   [name], [name] [[id number]]     Paul [name] Hollis  APRN, MSN, FNP-C [facility] [address]  [place], [place] [address] [phone]  

## 2019-04-30 NOTE — Telephone Encounter (Signed)
Refill request for oxycodone. Please advise.  

## 2019-05-18 ENCOUNTER — Encounter (HOSPITAL_BASED_OUTPATIENT_CLINIC_OR_DEPARTMENT_OTHER): Payer: Self-pay | Admitting: Emergency Medicine

## 2019-05-18 ENCOUNTER — Other Ambulatory Visit: Payer: Self-pay

## 2019-05-18 ENCOUNTER — Emergency Department (HOSPITAL_BASED_OUTPATIENT_CLINIC_OR_DEPARTMENT_OTHER)
Admission: EM | Admit: 2019-05-18 | Discharge: 2019-05-18 | Disposition: A | Discharge status: Home / Self Care | Payer: Medicaid Other | Attending: Emergency Medicine | Admitting: Emergency Medicine

## 2019-05-18 DIAGNOSIS — Z79899 Other long term (current) drug therapy: Secondary | ICD-10-CM | POA: Diagnosis not present

## 2019-05-18 DIAGNOSIS — G8929 Other chronic pain: Secondary | ICD-10-CM

## 2019-05-18 DIAGNOSIS — M79604 Pain in right leg: Secondary | ICD-10-CM | POA: Insufficient documentation

## 2019-05-18 DIAGNOSIS — M549 Dorsalgia, unspecified: Secondary | ICD-10-CM | POA: Diagnosis present

## 2019-05-18 DIAGNOSIS — D5700 Hb-SS disease with crisis, unspecified: Secondary | ICD-10-CM

## 2019-05-18 DIAGNOSIS — D571 Sickle-cell disease without crisis: Secondary | ICD-10-CM

## 2019-05-18 DIAGNOSIS — M79605 Pain in left leg: Secondary | ICD-10-CM | POA: Diagnosis not present

## 2019-05-18 LAB — CBC WITH DIFFERENTIAL/PLATELET
Abs Immature Granulocytes: 0.19 10*3/uL — ABNORMAL HIGH (ref 0.00–0.07)
Basophils Absolute: 0 10*3/uL (ref 0.0–0.1)
Basophils Relative: 0 %
Eosinophils Absolute: 0.5 10*3/uL (ref 0.0–0.5)
Eosinophils Relative: 4 %
HCT: 33.1 % — ABNORMAL LOW (ref 39.0–52.0)
Hemoglobin: 11.5 g/dL — ABNORMAL LOW (ref 13.0–17.0)
Immature Granulocytes: 2 %
Lymphocytes Relative: 13 %
Lymphs Abs: 1.5 10*3/uL (ref 0.7–4.0)
MCH: 25.6 pg — ABNORMAL LOW (ref 26.0–34.0)
MCHC: 34.7 g/dL (ref 30.0–36.0)
MCV: 73.6 fL — ABNORMAL LOW (ref 80.0–100.0)
Monocytes Absolute: 1.7 10*3/uL — ABNORMAL HIGH (ref 0.1–1.0)
Monocytes Relative: 15 %
Neutro Abs: 7.6 10*3/uL (ref 1.7–7.7)
Neutrophils Relative %: 66 %
Platelets: 190 10*3/uL (ref 150–400)
RBC: 4.5 MIL/uL (ref 4.22–5.81)
RDW: 14.7 % (ref 11.5–15.5)
Smear Review: NORMAL
WBC: 11.4 10*3/uL — ABNORMAL HIGH (ref 4.0–10.5)
nRBC: 0.3 % — ABNORMAL HIGH (ref 0.0–0.2)

## 2019-05-18 LAB — RETICULOCYTES
Immature Retic Fract: 35.1 % — ABNORMAL HIGH (ref 2.3–15.9)
RBC.: 4.53 MIL/uL (ref 4.22–5.81)
Retic Count, Absolute: 203.5 10*3/uL — ABNORMAL HIGH (ref 19.0–186.0)
Retic Ct Pct: 4.7 % — ABNORMAL HIGH (ref 0.4–3.1)

## 2019-05-18 LAB — BASIC METABOLIC PANEL
Anion gap: 9 (ref 5–15)
BUN: 6 mg/dL (ref 6–20)
CO2: 24 mmol/L (ref 22–32)
Calcium: 8.7 mg/dL — ABNORMAL LOW (ref 8.9–10.3)
Chloride: 105 mmol/L (ref 98–111)
Creatinine, Ser: 0.72 mg/dL (ref 0.61–1.24)
GFR calc Af Amer: >60 mL/min (ref >60)
GFR calc non Af Amer: >60 mL/min (ref >60)
Glucose, Bld: 90 mg/dL (ref 70–99)
Potassium: 3.5 mmol/L (ref 3.5–5.1)
Sodium: 138 mmol/L (ref 135–145)

## 2019-05-18 MED ORDER — ONDANSETRON HCL 4 MG/2ML IJ SOLN
4.0000 mg | INTRAMUSCULAR | Status: DC | PRN
  Administered 2019-05-18: 4 mg via INTRAVENOUS
  Filled 2019-05-18: qty 2

## 2019-05-18 MED ORDER — MORPHINE SULFATE (PF) 4 MG/ML IV SOLN
8.0000 mg | Freq: Once | INTRAVENOUS | Status: AC
  Administered 2019-05-18: 8 mg via INTRAVENOUS
  Filled 2019-05-18: qty 2

## 2019-05-18 MED ORDER — DIPHENHYDRAMINE HCL 50 MG/ML IJ SOLN
25.0000 mg | Freq: Once | INTRAMUSCULAR | Status: AC
  Administered 2019-05-18: 25 mg via INTRAVENOUS
  Filled 2019-05-18: qty 1

## 2019-05-18 MED ORDER — MORPHINE SULFATE (PF) 2 MG/ML IV SOLN
INTRAVENOUS | Status: AC
  Filled 2019-05-18: qty 1

## 2019-05-18 MED ORDER — SODIUM CHLORIDE 0.45 % IV SOLN: INTRAVENOUS | Status: DC

## 2019-05-18 MED ORDER — MORPHINE SULFATE (PF) 4 MG/ML IV SOLN
8.0000 mg | INTRAVENOUS | Status: AC
  Administered 2019-05-18: 8 mg via INTRAVENOUS
  Filled 2019-05-18: qty 2

## 2019-05-18 MED ORDER — MORPHINE SULFATE (PF) 10 MG/ML IV SOLN
10.0000 mg | INTRAVENOUS | Status: AC
  Administered 2019-05-18: 10 mg via INTRAVENOUS
  Filled 2019-05-18: qty 1

## 2019-05-18 MED ORDER — KETOROLAC TROMETHAMINE 15 MG/ML IJ SOLN
15.0000 mg | INTRAMUSCULAR | Status: AC
  Administered 2019-05-18: 15 mg via INTRAVENOUS
  Filled 2019-05-18: qty 1

## 2019-05-18 MED ORDER — MORPHINE SULFATE (PF) 4 MG/ML IV SOLN
INTRAVENOUS | Status: AC
  Filled 2019-05-18: qty 2

## 2019-05-18 NOTE — ED Triage Notes (Signed)
Pt to ED via [facility] with c/o sickle cell pain that started this morning; sts he is out of pain medication; received Fentanyl IV by EMS

## 2019-05-18 NOTE — Telephone Encounter (Signed)
Refill request for oxycodone. Thanks!

## 2019-05-18 NOTE — Discharge Instructions (Addendum)
Continue your current pain medications.  Follow-up with your sickle cell doctor.  Return as needed for worsening symptoms.

## 2019-05-18 NOTE — ED Provider Notes (Signed)
[facility] EMERGENCY DEPARTMENT Provider Note   CSN: [id number] Arrival date & time: 05/18/19  0920     History Chief Complaint  Patient presents with  . Sickle Cell Pain Crisis    Paul Campos is a 29 y.o. male.  HPI   Patient presents to the ED for evaluation of pain associated with his sickle cell disease.  Patient has a history of sickle cell disease.  Patient states the symptoms started this morning.  He called EMS and was given 100 mcg of fentanyl.  Patient states he is having his typical pain crisis with pain in his back and legs.  He denies any joint swelling.  He denies any fevers or chills.  No cough or shortness of breath.  No chest pain.  Patient was at another emergency room on March 8 for recurrent sickle cell pain crisis.  According to the medical records he was given 60 tablets of his oxycodone on March 8 by doctors at the sickle cell center.  Past Medical History:  Diagnosis Date  . Sickle cell anemia [facility])     Patient Active Problem List   Diagnosis Date Noted  . Nausea & vomiting 11/05/2018  . Sickle cell anemia with crisis ([facility]) 11/04/2018  . Hb-SS disease without crisis ([place]) 01/02/2018  . Hypokalemia 08/21/2012  . Thrombocytopenia, unspecified ([place]) 08/19/2012  . CAP (community acquired pneumonia) 08/18/2012  . Sickle cell pain crisis ([facility]) 05/06/2012  . Chest pain 05/06/2012    Past Surgical History:  Procedure Laterality Date  . APPENDECTOMY         Family History  Problem Relation Age of Onset  . Lung cancer Mother     Social History   Tobacco Use  . Smoking status: Never Smoker  . Smokeless tobacco: Never Used  Substance Use Topics  . Alcohol use: No  . Drug use: No    Home Medications Prior to Admission medications   Medication Sig Start Date End Date Taking? Authorizing Provider  butalbital-acetaminophen-caffeine (FIORICET) 50-325-40 MG tablet Take 1 tablet by mouth every 6 (six) hours as needed for headache.  04/10/19 04/09/20  Dorena Dew, FNP  folic acid (FOLVITE) 1 MG tablet Take 1 tablet (1 mg total) by mouth daily. 07/27/18   Lanae Boast, FNP  hydroxyurea (HYDREA) 500 MG capsule Take 500 mg by mouth daily. May take with food to minimize GI side effects.    [provider]  ibuprofen (ADVIL) 800 MG tablet Take 1 tablet (800 mg total) by mouth every 8 (eight) hours as needed. 02/06/19   Dorena Dew, FNP  Oxycodone HCl 20 MG TABS Take 1 tablet (20 mg total) by mouth every 4 (four) hours as needed. 04/30/19   Dorena Dew, FNP  voxelotor (OXBRYTA) 500 MG TABS tablet Take 1,000 mg by mouth daily. 04/10/19   Dorena Dew, FNP    Allergies    Dilaudid [hydromorphone hcl], Hydromorphone, and Cefotaxime  Review of Systems   Review of Systems  All other systems reviewed and are negative.   Physical Exam Updated Vital Signs BP 118/80   Pulse (!) 54   Temp 98.6 F (37 C) (Oral)   Resp 15   Ht 1.829 m (6')   Wt 79.4 kg   SpO2 99%   BMI 23.73 kg/m   Physical Exam Vitals and nursing note reviewed.  Constitutional:      Appearance: He is well-developed.     Comments: Appears to be in pain  HENT:  Head: Normocephalic and atraumatic.     Right Ear: External ear normal.     Left Ear: External ear normal.  Eyes:     General: No scleral icterus.       Right eye: No discharge.        Left eye: No discharge.     Conjunctiva/sclera: Conjunctivae normal.  Neck:     Trachea: No tracheal deviation.  Cardiovascular:     Rate and Rhythm: Normal rate and regular rhythm.  Pulmonary:     Effort: Pulmonary effort is normal. No respiratory distress.     Breath sounds: Normal breath sounds. No stridor. No wheezing or rales.  Abdominal:     General: Bowel sounds are normal. There is no distension.     Palpations: Abdomen is soft.     Tenderness: There is no abdominal tenderness. There is no guarding or rebound.  Musculoskeletal:        General: No tenderness.      Cervical back: Neck supple.  Skin:    General: Skin is warm and dry.     Findings: No rash.  Neurological:     Mental Status: He is alert.     Cranial Nerves: No cranial nerve deficit (no facial droop, extraocular movements intact, no slurred speech).     Sensory: No sensory deficit.     Motor: No abnormal muscle tone or seizure activity.     Coordination: Coordination normal.     ED Results / Procedures / Treatments   Labs (all labs ordered are listed, but only abnormal results are displayed) Labs Reviewed  CBC WITH DIFFERENTIAL/PLATELET - Abnormal; Notable for the following components:      Result Value   WBC 11.4 (*)    Hemoglobin 11.5 (*)    HCT 33.1 (*)    MCV 73.6 (*)    MCH 25.6 (*)    nRBC 0.3 (*)    Monocytes Absolute 1.7 (*)    Abs Immature Granulocytes 0.19 (*)    All other components within normal limits  RETICULOCYTES - Abnormal; Notable for the following components:   Retic Ct Pct 4.7 (*)    Retic Count, Absolute 203.5 (*)    Immature Retic Fract 35.1 (*)    All other components within normal limits  BASIC METABOLIC PANEL - Abnormal; Notable for the following components:   Calcium 8.7 (*)    All other components within normal limits    EKG None  Radiology No results found.  Procedures Procedures (including critical care time)  Medications Ordered in ED Medications  0.45 % sodium chloride infusion ( Intravenous New Bag/Given 05/18/19 1017)  ondansetron (ZOFRAN) injection 4 mg (4 mg Intravenous Given 05/18/19 1018)  morphine 2 MG/ML injection (has no administration in time range)  morphine 4 MG/ML injection 8 mg (8 mg Intravenous Given 05/18/19 1021)  morphine 10 MG/ML injection 10 mg (10 mg Intravenous Given 05/18/19 1125)  diphenhydrAMINE (BENADRYL) injection 25 mg (25 mg Intravenous Given 05/18/19 1020)  ketorolac (TORADOL) 15 MG/ML injection 15 mg (15 mg Intravenous Given 05/18/19 1019)  morphine 4 MG/ML injection 8 mg (8 mg Intravenous Given 05/18/19  1217)    ED Course  I have reviewed the triage vital signs and the nursing notes.  Pertinent labs & imaging results that were available during my care of the patient were reviewed by me and considered in my medical decision making (see chart for details).  Clinical Course as of May 18 1319  Fri May 18, 2019  1140 Patient's laboratory tests are reassuring.   [JK]  1152 Patient states he is feeling better.  Feels like he can manage at home.  He would like 1 more dose of medications   [JK]    Clinical Course User Index [JK] Linwood Dibbles, MD   MDM Rules/Calculators/A&P                      Patient presented to the ED with symptoms consistent with his typical sickle cell pain crisis.  Patient's ED work-up is reassuring.  No signs of infection.  Patient responded to treatment.  [employer] reviewed.  Patient was given 60 oxycodone tablets on March 8.  At this time there does not appear to be any evidence of an acute emergency medical condition and the patient appears stable for discharge with appropriate outpatient follow up.  Final Clinical Impression(s) / ED Diagnoses Final diagnoses:  Sickle cell pain crisis [facility])    Rx / DC Orders ED Discharge Orders    None       Linwood Dibbles, MD 05/18/19 1322

## 2019-05-20 ENCOUNTER — Other Ambulatory Visit: Payer: Self-pay | Admitting: Family Medicine

## 2019-05-20 DIAGNOSIS — G8929 Other chronic pain: Secondary | ICD-10-CM

## 2019-05-20 DIAGNOSIS — D571 Sickle-cell disease without crisis: Secondary | ICD-10-CM

## 2019-05-20 MED ORDER — OXYCODONE HCL 20 MG PO TABS: 20.0000 mg | ORAL_TABLET | ORAL | 0 refills | Status: DC | PRN

## 2019-05-20 NOTE — Progress Notes (Signed)
Reviewed PDMP substance reporting system prior to prescribing opiate medications. No inconsistencies noted.   Meds ordered this encounter  Medications  . Oxycodone HCl 20 MG TABS    Sig: Take 1 tablet (20 mg total) by mouth every 4 (four) hours as needed.    Dispense:  60 tablet    Refill:  0    Order Specific Question:   Supervising Provider    Answer:   [name], [name] [[id number]]     Shardee Dieu [name] Zaia Carre  APRN, MSN, FNP-C [facility] [address]  [place], [place] [address] [phone]  

## 2019-06-08 ENCOUNTER — Other Ambulatory Visit: Payer: Self-pay

## 2019-06-08 DIAGNOSIS — D571 Sickle-cell disease without crisis: Secondary | ICD-10-CM

## 2019-06-08 DIAGNOSIS — G8929 Other chronic pain: Secondary | ICD-10-CM

## 2019-06-08 NOTE — Telephone Encounter (Signed)
Patient requesting refill Oxycodone HCL 20 mg

## 2019-06-09 ENCOUNTER — Other Ambulatory Visit: Payer: Self-pay | Admitting: Family Medicine

## 2019-06-09 DIAGNOSIS — G8929 Other chronic pain: Secondary | ICD-10-CM

## 2019-06-09 DIAGNOSIS — D571 Sickle-cell disease without crisis: Secondary | ICD-10-CM

## 2019-06-09 MED ORDER — OXYCODONE HCL 20 MG PO TABS: 20.0000 mg | ORAL_TABLET | ORAL | 0 refills | Status: DC | PRN

## 2019-06-09 NOTE — Progress Notes (Signed)
Reviewed PDMP substance reporting system prior to prescribing opiate medications. No inconsistencies noted.   Meds ordered this encounter  Medications  . Oxycodone HCl 20 MG TABS    Sig: Take 1 tablet (20 mg total) by mouth every 4 (four) hours as needed.    Dispense:  60 tablet    Refill:  0    Order Specific Question:   Supervising Provider    Answer:   [name], [name] [[id number]]     Alleigh Mollica [name] Donivin Wirt  APRN, MSN, FNP-C [facility] [address]  [place], [place] [address] [phone]  

## 2019-06-27 ENCOUNTER — Other Ambulatory Visit: Payer: Self-pay | Admitting: Family Medicine

## 2019-06-27 ENCOUNTER — Other Ambulatory Visit: Payer: Self-pay

## 2019-06-27 DIAGNOSIS — G44209 Tension-type headache, unspecified, not intractable: Secondary | ICD-10-CM

## 2019-06-27 DIAGNOSIS — D571 Sickle-cell disease without crisis: Secondary | ICD-10-CM

## 2019-06-27 DIAGNOSIS — G8929 Other chronic pain: Secondary | ICD-10-CM

## 2019-06-27 MED ORDER — BUTALBITAL-APAP-CAFFEINE 50-325-40 MG PO TABS: 1.0000 | ORAL_TABLET | Freq: Four times a day (QID) | ORAL | 2 refills | Status: DC | PRN

## 2019-06-27 MED ORDER — OXYCODONE HCL 20 MG PO TABS: 20.0000 mg | ORAL_TABLET | ORAL | 0 refills | Status: DC | PRN

## 2019-06-27 NOTE — Progress Notes (Signed)
Reviewed PDMP substance reporting system prior to prescribing opiate medications. No inconsistencies noted.   Meds ordered this encounter  Medications  . Oxycodone HCl 20 MG TABS    Sig: Take 1 tablet (20 mg total) by mouth every 4 (four) hours as needed.    Dispense:  60 tablet    Refill:  0    Order Specific Question:   Supervising Provider    Answer:   Quentin Angst [id number]  . butalbital-acetaminophen-caffeine (FIORICET) 50-325-40 MG tablet    Sig: Take 1 tablet by mouth every 6 (six) hours as needed for headache.    Dispense:  30 tablet    Refill:  2    Order Specific Question:   Supervising Provider    Answer:   Quentin Angst [[id number]]      Nolon Nations  APRN, MSN, FNP-C Patient Care [facility] [address] [place], [place] [address] [phone]

## 2019-07-10 ENCOUNTER — Other Ambulatory Visit: Payer: Self-pay

## 2019-07-10 ENCOUNTER — Encounter: Payer: Self-pay | Admitting: Family Medicine

## 2019-07-10 ENCOUNTER — Ambulatory Visit (INDEPENDENT_AMBULATORY_CARE_PROVIDER_SITE_OTHER): Payer: Self-pay | Admitting: Family Medicine

## 2019-07-10 VITALS — BP 122/76 | HR 71 | Ht 72.0 in | Wt 182.2 lb

## 2019-07-10 DIAGNOSIS — G8929 Other chronic pain: Secondary | ICD-10-CM

## 2019-07-10 DIAGNOSIS — D571 Sickle-cell disease without crisis: Secondary | ICD-10-CM

## 2019-07-10 DIAGNOSIS — E559 Vitamin D deficiency, unspecified: Secondary | ICD-10-CM

## 2019-07-10 DIAGNOSIS — G44209 Tension-type headache, unspecified, not intractable: Secondary | ICD-10-CM

## 2019-07-10 LAB — POCT URINALYSIS DIPSTICK
Bilirubin, UA: NEGATIVE
Blood, UA: NEGATIVE
Glucose, UA: NEGATIVE
Ketones, UA: NEGATIVE
Leukocytes, UA: NEGATIVE
Nitrite, UA: NEGATIVE
Protein, UA: NEGATIVE
Spec Grav, UA: 1.015 (ref 1.010–1.025)
Urobilinogen, UA: 0.2 E.U./dL
pH, UA: 7 (ref 5.0–8.0)

## 2019-07-10 NOTE — Progress Notes (Signed)
[facility]    Established Patient Office Visit  Subjective:  Patient ID: Paul Campos, male    DOB: [birth year]  Age: 29 y.o. MRN: [id number]  CC:  Chief Complaint  Patient presents with  . Follow-up    3 month follow up , no concerns per patient , needs a refill on headache  medication     Paul Campos is a 29 year old male with a medical history of sickle cell disease, chronic pain syndrome, opiate dependence and tolerance, persistent headaches, and anemia of chronic disease presents for follow-up of chronic conditions.  Patient continues to complain of intermittent headaches that have been mildly relieved by Fioricet.  Patient feels that the headaches are occurring more than usual.  Headache  This is a chronic problem. The current episode started more than 1 year ago. The problem occurs intermittently. The problem has been waxing and waning. The pain is located in the bilateral region. The pain does not radiate. The pain quality is similar to prior headaches. The quality of the pain is described as aching and squeezing (He feels "tightening" around his head). The pain is at a severity of 4/10. The pain is moderate. Pertinent negatives include no abnormal behavior, facial sweating, hearing loss, insomnia, scalp tenderness, seizures, sinus pressure or sore throat. He has tried NSAIDs and darkened room for the symptoms. The treatment provided mild relief. There is no history of migraine headaches or migraines in the family.   Sickle cell disease: Patient has a history of sickle cell disease and chronic pain syndrome.  Pain has been well controlled on current medication regimen.  Pain is typically to low back and lower extremities.  He has been taking hydroxyurea and folic acid consistently to prevent vaso-occlusive pain crisis.  Sickle cell has been very well controlled.  His current pain intensity is 4/10 primarily to lower back. He denies  dizziness, paresthesias, weakness, urinary symptoms, nausea, vomiting, or diarrhea.  Patient also denies visual changes.  He is not up-to-date with eye exams and does not have an ophthalmologist  Past Medical History:  Diagnosis Date  . Sickle cell anemia (HCC)     Past Surgical History:  Procedure Laterality Date  . APPENDECTOMY      Family History  Problem Relation Age of Onset  . Lung cancer Mother     Social History   Socioeconomic History  . Marital status: Single    Spouse name: Not on file  . Number of children: Not on file  . Years of education: Not on file  . Highest education level: Not on file  Occupational History  . Not on file  Tobacco Use  . Smoking status: Never Smoker  . Smokeless tobacco: Never Used  Substance and Sexual Activity  . Alcohol use: No  . Drug use: No  . Sexual activity: Not on file  Other Topics Concern  . Not on file  Social History Narrative  . Not on file   Social Determinants of Health   Financial Resource Strain:   . Difficulty of Paying Living Expenses:   Food Insecurity:   . Worried About Programme researcher, broadcasting/film/video in the Last Year:   . Barista in the Last Year:   Transportation Needs:   . Freight forwarder (Medical):   Marland Kitchen Lack of Transportation (Non-Medical):   Physical Activity:   . Days of Exercise per Week:   . Minutes of Exercise per Session:  Stress:   . Feeling of Stress :   Social Connections:   . Frequency of Communication with Friends and Family:   . Frequency of Social Gatherings with Friends and Family:   . Attends Religious Services:   . Active Member of Clubs or Organizations:   . Attends Banker Meetings:   Marland Kitchen Marital Status:   Intimate Partner Violence:   . Fear of Current or Ex-Partner:   . Emotionally Abused:   Marland Kitchen Physically Abused:   . Sexually Abused:     Outpatient Medications Prior to Visit  Medication Sig Dispense Refill  . butalbital-acetaminophen-caffeine (FIORICET)  50-325-40 MG tablet Take 1 tablet by mouth every 6 (six) hours as needed for headache. 30 tablet 2  . folic acid (FOLVITE) 1 MG tablet Take 1 tablet (1 mg total) by mouth daily. 30 tablet 11  . hydroxyurea (HYDREA) 500 MG capsule Take 500 mg by mouth daily. May take with food to minimize GI side effects.    Marland Kitchen ibuprofen (ADVIL) 800 MG tablet Take 1 tablet (800 mg total) by mouth every 8 (eight) hours as needed. 60 tablet 1  . Oxycodone HCl 20 MG TABS Take 1 tablet (20 mg total) by mouth every 4 (four) hours as needed. 60 tablet 0  . voxelotor (OXBRYTA) 500 MG TABS tablet Take 1,000 mg by mouth daily. 90 tablet 1   No facility-administered medications prior to visit.    Allergies  Allergen Reactions  . Dilaudid [Hydromorphone Hcl] Hives  . Hydromorphone Itching    Morphine okay.  . Cefotaxime Itching and Rash    Hives    ROS Review of Systems  HENT: Negative for hearing loss, sinus pressure and sore throat.   Neurological: Positive for headaches. Negative for seizures.  Psychiatric/Behavioral: The patient does not have insomnia.       Objective:    Physical Exam  BP 122/76 (BP Location: Left Arm, Patient Position: Sitting)   Pulse 71   Ht 6' (1.829 m)   Wt 182 lb 3.2 oz (82.6 kg)   SpO2 100%   BMI 24.71 kg/m  Wt Readings from Last 3 Encounters:  07/10/19 182 lb 3.2 oz (82.6 kg)  05/18/19 175 lb (79.4 kg)  04/10/19 175 lb (79.4 kg)     There are no preventive care reminders to display for this patient.  There are no preventive care reminders to display for this patient.  No results found for: TSH Lab Results  Component Value Date   WBC 11.4 (H) 05/18/2019   HGB 11.5 (L) 05/18/2019   HCT 33.1 (L) 05/18/2019   MCV 73.6 (L) 05/18/2019   PLT 190 05/18/2019   Lab Results  Component Value Date   NA 138 05/18/2019   K 3.5 05/18/2019   CO2 24 05/18/2019   GLUCOSE 90 05/18/2019   BUN 6 05/18/2019   CREATININE 0.72 05/18/2019   BILITOT 1.0 04/10/2019   ALKPHOS 75  04/10/2019   AST 20 04/10/2019   ALT 11 04/10/2019   PROT 7.3 04/10/2019   ALBUMIN 4.4 04/10/2019   CALCIUM 8.7 (L) 05/18/2019   ANIONGAP 9 05/18/2019   No results found for: CHOL No results found for: HDL No results found for: LDLCALC No results found for: TRIG No results found for: CHOLHDL No results found for: AJOI7O    Assessment & Plan:   Problem List Items Addressed This Visit      Other   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 We discussed the need for good hydration, monitoring of hydration status, avoidance of heat, cold, stress, and infection triggers. We discussed the risks and benefits of Hydrea, including bone marrow suppression, the possibility of GI upset, skin ulcers, hair thinning, and teratogenicity. The patient was reminded of the need to seek medical attention of any symptoms of bleeding, anemia, or infection. Continue folic acid 1 mg daily to prevent aplastic bone marrow crises.   Pulmonary evaluation - Patient denies severe recurrent wheezes, shortness of breath with exercise, or persistent cough. If these symptoms develop, pulmonary function tests with spirometry will be ordered, and if abnormal, plan on referral to Pulmonology for further evaluation.  Cardiac - Routine screening for pulmonary hypertension is not recommended.   Eye - High risk of proliferative retinopathy. Annual eye exam with retinal exam recommended to patient.   Immunization status - Recommend COVID 19 vaccination. Patient and I discussed at length, he expressed understanding.   Acute and chronic painful episodes -  Chronic pain is controlled on current  medication regimen, no changes will be made on today. We reviewed the terms of our pain agreement, including the need to keep medicines in a safe locked location away from children or pets, and the need to report excess sedation or constipation, measures to avoid constipation, and policies related to early refills and stolen prescriptions. According to the [place] Chronic Pain Initiative program, we have reviewed details related to analgesia, adverse effects, aberrant behaviors.    - 329924 11+Oxyco+Alc+Crt-Bund - Sickle Cell Panel - CMP and Liver - Urinalysis Dipstick - Ambulatory referral to Ophthalmology  Other chronic pain  No medication changes warranted on today - 268341 11+Oxyco+Alc+Crt-Bund Vitamin D deficiency  - Vitamin D, 25-hydroxy  Acute non intractable tension-type headache Patient has ongoing headaches. He feels that headaches have not been well controlled. Patient warrants a referral to neurology for further workup and evaluation of headaches due to history of sickle cell disease.  - Ambulatory referral to Neurology  Follow-up: Return in about 3 months (around 10/10/2019).    Donia Pounds  APRN, MSN, FNP-C Patient [facility] [address] [place], [place] [address] [phone]

## 2019-07-10 NOTE — Patient Instructions (Signed)
COVID-19 Vaccine Information can be found at: [email or web address] For questions related to vaccine distribution or appointments, please email [email or web address] or call [phone].    Sickle Cell Anemia, Adult  Sickle cell anemia is a condition where your red blood cells are shaped like sickles. Red blood cells carry oxygen through the body. Sickle-shaped cells do not live as long as normal red blood cells. They also clump together and block blood from flowing through the blood vessels. This prevents the body from getting enough oxygen. Sickle cell anemia causes organ damage and pain. It also increases the risk of infection. Follow these instructions at home: Medicines  Take over-the-counter and prescription medicines only as told by your doctor.  If you were prescribed an antibiotic medicine, take it as told by your doctor. Do not stop taking the antibiotic even if you start to feel better.  If you develop a fever, do not take medicines to lower the fever right away. Tell your doctor about the fever. Managing pain, stiffness, and swelling  Try these methods to help with pain: ? Use a heating pad. ? Take a warm bath. ? Distract yourself, such as by watching TV. Eating and drinking  Drink enough fluid to keep your pee (urine) clear or pale yellow. Drink more in hot weather and during exercise.  Limit or avoid alcohol.  Eat a healthy diet. Eat plenty of fruits, vegetables, whole grains, and lean protein.  Take vitamins and supplements as told by your doctor. Traveling  When traveling, keep these with you: ? Your medical information. ? The names of your doctors. ? Your medicines.  If you need to take an airplane, talk to your doctor first. Activity  Rest often.  Avoid exercises that make your heart beat much faster, such as jogging. General instructions  Do not use products that have nicotine or tobacco, such as  cigarettes and e-cigarettes. If you need help quitting, ask your doctor.  Consider wearing a medical alert bracelet.  Avoid being in high places (high altitudes), such as mountains.  Avoid very hot or cold temperatures.  Avoid places where the temperature changes a lot.  Keep all follow-up visits as told by your doctor. This is important. Contact a doctor if:  A joint hurts.  Your feet or hands hurt or swell.  You feel tired (fatigued). Get help right away if:  You have symptoms of infection. These include: ? Fever. ? Chills. ? Being very tired. ? Irritability. ? Poor eating. ? Throwing up (vomiting).  You feel dizzy or faint.  You have new stomach pain, especially on the left side.  You have a an erection (priapism) that lasts more than 4 hours.  You have numbness in your arms or legs.  You have a hard time moving your arms or legs.  You have trouble talking.  You have pain that does not go away when you take medicine.  You are short of breath.  You are breathing fast.  You have a long-term cough.  You have pain in your chest.  You have a bad headache.  You have a stiff neck.  Your stomach looks bloated even though you did not eat much.  Your skin is pale.  You suddenly cannot see well. Summary  Sickle cell anemia is a condition where your red blood cells are shaped like sickles.  Follow your doctor's advice on ways to manage pain, food to eat, activities to do, and steps to take for safe travel.  Get medical help   right away if you have any signs of infection, such as a fever. This information is not intended to replace advice given to you by your health care provider. Make sure you discuss any questions you have with your health care provider. Document Revised: 06/02/2018 Document Reviewed: 03/16/2016 Elsevier Patient Education  2020 Elsevier Inc.  

## 2019-07-11 LAB — CMP AND LIVER: Bilirubin, Direct: 0.22 mg/dL (ref 0.00–0.40)

## 2019-07-11 LAB — CMP14+CBC/D/PLT+FER+RETIC+V...
ALT: 11 IU/L (ref 0–44)
AST: 21 IU/L (ref 0–40)
Albumin/Globulin Ratio: 1.6 (ref 1.2–2.2)
Albumin: 4.4 g/dL (ref 4.1–5.2)
Alkaline Phosphatase: 76 IU/L (ref 48–121)
BUN/Creatinine Ratio: 6 — ABNORMAL LOW (ref 9–20)
BUN: 5 mg/dL — ABNORMAL LOW (ref 6–20)
Basophils Absolute: 0.1 10*3/uL (ref 0.0–0.2)
Basos: 1 %
Bilirubin Total: 0.6 mg/dL (ref 0.0–1.2)
CO2: 24 mmol/L (ref 20–29)
Calcium: 9 mg/dL (ref 8.7–10.2)
Chloride: 105 mmol/L (ref 96–106)
Creatinine, Ser: 0.83 mg/dL (ref 0.76–1.27)
EOS (ABSOLUTE): 0.5 10*3/uL — ABNORMAL HIGH (ref 0.0–0.4)
Eos: 7 %
Ferritin: 140 ng/mL (ref 30–400)
GFR calc Af Amer: 139 mL/min/{1.73_m2} (ref >59)
GFR calc non Af Amer: 121 mL/min/{1.73_m2} (ref >59)
Globulin, Total: 2.7 g/dL (ref 1.5–4.5)
Glucose: 90 mg/dL (ref 65–99)
Hematocrit: 39.1 % (ref 37.5–51.0)
Hemoglobin: 12.8 g/dL — ABNORMAL LOW (ref 13.0–17.7)
Immature Grans (Abs): 0 10*3/uL (ref 0.0–0.1)
Immature Granulocytes: 0 %
Lymphocytes Absolute: 1.6 10*3/uL (ref 0.7–3.1)
Lymphs: 20 %
MCH: 25.9 pg — ABNORMAL LOW (ref 26.6–33.0)
MCHC: 32.7 g/dL (ref 31.5–35.7)
MCV: 79 fL (ref 79–97)
Monocytes Absolute: 1 10*3/uL — ABNORMAL HIGH (ref 0.1–0.9)
Monocytes: 13 %
Neutrophils Absolute: 4.6 10*3/uL (ref 1.4–7.0)
Neutrophils: 59 %
Platelets: 166 10*3/uL (ref 150–450)
Potassium: 4.6 mmol/L (ref 3.5–5.2)
RBC: 4.95 x10E6/uL (ref 4.14–5.80)
RDW: 18.5 % — ABNORMAL HIGH (ref 11.6–15.4)
Retic Ct Pct: 4.2 % — ABNORMAL HIGH (ref 0.6–2.6)
Sodium: 140 mmol/L (ref 134–144)
Total Protein: 7.1 g/dL (ref 6.0–8.5)
Vit D, 25-Hydroxy: 9 ng/mL — ABNORMAL LOW (ref 30.0–100.0)
WBC: 7.8 10*3/uL (ref 3.4–10.8)

## 2019-07-12 ENCOUNTER — Telehealth: Payer: Self-pay

## 2019-07-12 ENCOUNTER — Other Ambulatory Visit: Payer: Self-pay | Admitting: Family Medicine

## 2019-07-12 DIAGNOSIS — E559 Vitamin D deficiency, unspecified: Secondary | ICD-10-CM

## 2019-07-12 MED ORDER — ERGOCALCIFEROL 1.25 MG (50000 UT) PO CAPS: 50000.0000 [IU] | ORAL_CAPSULE | ORAL | 1 refills | Status: DC

## 2019-07-12 NOTE — Progress Notes (Signed)
Meds ordered this encounter  Medications   ergocalciferol (VITAMIN D2) 1.25 MG (50000 UT) capsule    Sig: Take 1 capsule (50,000 Units total) by mouth once a week.    Dispense:  12 capsule    Refill:  1    Order Specific Question:   Supervising Provider    Answer:   [name], [name] [[id number]]   Lachina [name] Hollis  APRN, MSN, FNP-C [facility] [address]  [place],  [address] [phone]  

## 2019-07-12 NOTE — Telephone Encounter (Signed)
Called an notified patient of results, explain to patient about vit d  Level and starting new medication. And the staying will hydration

## 2019-07-12 NOTE — Telephone Encounter (Signed)
-----   Message from Massie Maroon, [place] sent at 07/12/2019 12:51 PM EDT ----- Regarding: lab results Please inform patient her vitamin D level is extremely low at 9.0.  We will start Drisdol 50,000 mg weekly.  Also, recommend foods that are high in vitamin D such as mackerel,  sardines, salmon, egg yolks, dairy, red meat, etc. will check vitamin D level in 3 months.  Hemoglobin is 12.8, consistent with patient's baseline.  Also, stressed the importance of hydrating 64 ounces of water daily as the weather gets warmer to prevent dehydration.  Follow-up in office as scheduled.Nolon Nations  APRN, MSN, FNP-C Patient Care [facility] [address] [place], [place] [address] [phone]

## 2019-07-14 ENCOUNTER — Other Ambulatory Visit: Payer: Self-pay

## 2019-07-14 DIAGNOSIS — D571 Sickle-cell disease without crisis: Secondary | ICD-10-CM

## 2019-07-14 DIAGNOSIS — G8929 Other chronic pain: Secondary | ICD-10-CM

## 2019-07-15 LAB — CANNABINOID CONFIRMATION, UR
CANNABINOIDS: POSITIVE — AB
Carboxy THC GC/MS Conf: 67 ng/mL

## 2019-07-15 LAB — DRUG SCREEN 764883 11+OXYCO+ALC+CRT-BUND
Amphetamines, Urine: NEGATIVE ng/mL
BENZODIAZ UR QL: NEGATIVE ng/mL
Barbiturate: NEGATIVE ng/mL
Cocaine (Metabolite): NEGATIVE ng/mL
Creatinine: 48 mg/dL (ref 20.0–300.0)
Ethanol: NEGATIVE %
Meperidine: NEGATIVE ng/mL
Methadone Screen, Urine: NEGATIVE ng/mL
OPIATE SCREEN URINE: NEGATIVE ng/mL
Oxycodone/Oxymorphone, Urine: NEGATIVE ng/mL
Phencyclidine: NEGATIVE ng/mL
Propoxyphene: NEGATIVE ng/mL
Tramadol: NEGATIVE ng/mL
pH, Urine: 7.2 (ref 4.5–8.9)

## 2019-07-16 ENCOUNTER — Other Ambulatory Visit: Payer: Self-pay

## 2019-07-16 ENCOUNTER — Other Ambulatory Visit: Payer: Self-pay | Admitting: Family Medicine

## 2019-07-16 DIAGNOSIS — D571 Sickle-cell disease without crisis: Secondary | ICD-10-CM

## 2019-07-16 DIAGNOSIS — G8929 Other chronic pain: Secondary | ICD-10-CM

## 2019-07-16 MED ORDER — OXYCODONE HCL 20 MG PO TABS: 20.0000 mg | ORAL_TABLET | ORAL | 0 refills | Status: DC | PRN

## 2019-07-16 NOTE — Progress Notes (Signed)
Reviewed PDMP substance reporting system prior to prescribing opiate medications. No inconsistencies noted.   Meds ordered this encounter  Medications  . Oxycodone HCl 20 MG TABS    Sig: Take 1 tablet (20 mg total) by mouth every 4 (four) hours as needed.    Dispense:  60 tablet    Refill:  0    Order Specific Question:   Supervising Provider    Answer:   [name], [name] [[id number]]     Paul Ivanov [name] Arman Loy  APRN, MSN, FNP-C [facility] [address]  [place], [place] [address] [phone]  

## 2019-07-16 NOTE — Telephone Encounter (Signed)
Oxycodone 20 MG refill request

## 2019-07-16 NOTE — Telephone Encounter (Signed)
Possible duplicated request. I am not able to close task.

## 2019-08-03 ENCOUNTER — Other Ambulatory Visit: Payer: Self-pay

## 2019-08-03 DIAGNOSIS — D571 Sickle-cell disease without crisis: Secondary | ICD-10-CM

## 2019-08-03 DIAGNOSIS — G8929 Other chronic pain: Secondary | ICD-10-CM

## 2019-08-03 NOTE — Telephone Encounter (Signed)
Last OV--04/10/19 Last refill--07/16/19 Rx oxycodone 20 mg refill Please advise

## 2019-08-04 ENCOUNTER — Other Ambulatory Visit: Payer: Self-pay | Admitting: Family Medicine

## 2019-08-04 DIAGNOSIS — G8929 Other chronic pain: Secondary | ICD-10-CM

## 2019-08-04 DIAGNOSIS — D571 Sickle-cell disease without crisis: Secondary | ICD-10-CM

## 2019-08-04 MED ORDER — OXYCODONE HCL 20 MG PO TABS: 20.0000 mg | ORAL_TABLET | ORAL | 0 refills | Status: DC | PRN

## 2019-08-04 NOTE — Progress Notes (Signed)
Reviewed PDMP substance reporting system prior to prescribing opiate medications. No inconsistencies noted.   Meds ordered this encounter  Medications  . Oxycodone HCl 20 MG TABS    Sig: Take 1 tablet (20 mg total) by mouth every 4 (four) hours as needed.    Dispense:  60 tablet    Refill:  0    Order Specific Question:   Supervising Provider    Answer:   [name], [name] [[id number]]     Delsa Walder [name] Javan Gonzaga  APRN, MSN, FNP-C [facility] [address]  [place], [place] [address] [phone]  

## 2019-08-16 ENCOUNTER — Other Ambulatory Visit: Payer: Self-pay

## 2019-08-16 DIAGNOSIS — G8929 Other chronic pain: Secondary | ICD-10-CM

## 2019-08-16 DIAGNOSIS — D571 Sickle-cell disease without crisis: Secondary | ICD-10-CM

## 2019-08-16 NOTE — Telephone Encounter (Signed)
Please review  Thank you

## 2019-08-17 ENCOUNTER — Other Ambulatory Visit: Payer: Self-pay | Admitting: Family Medicine

## 2019-08-17 DIAGNOSIS — G8929 Other chronic pain: Secondary | ICD-10-CM

## 2019-08-17 DIAGNOSIS — D571 Sickle-cell disease without crisis: Secondary | ICD-10-CM

## 2019-08-17 MED ORDER — OXYCODONE HCL 20 MG PO TABS: 20.0000 mg | ORAL_TABLET | ORAL | 0 refills | Status: DC | PRN

## 2019-08-17 NOTE — Progress Notes (Signed)
Reviewed PDMP substance reporting system prior to prescribing opiate medications. No inconsistencies noted.   Meds ordered this encounter  Medications  . Oxycodone HCl 20 MG TABS    Sig: Take 1 tablet (20 mg total) by mouth every 4 (four) hours as needed.    Dispense:  60 tablet    Refill:  0    Order Specific Question:   Supervising Provider    Answer:   [name], [name] [[id number]]     Paul Campos Fairy Ashlock  APRN, MSN, FNP-C [facility] [address]  [place], [place] [address] [phone]  

## 2019-08-29 ENCOUNTER — Other Ambulatory Visit: Payer: Self-pay

## 2019-08-29 ENCOUNTER — Other Ambulatory Visit: Payer: Self-pay | Admitting: Family Medicine

## 2019-08-29 DIAGNOSIS — G8929 Other chronic pain: Secondary | ICD-10-CM

## 2019-08-29 DIAGNOSIS — D571 Sickle-cell disease without crisis: Secondary | ICD-10-CM

## 2019-08-29 MED ORDER — OXYCODONE HCL 20 MG PO TABS: 20.0000 mg | ORAL_TABLET | ORAL | 0 refills | Status: DC | PRN

## 2019-08-29 NOTE — Progress Notes (Signed)
Reviewed PDMP substance reporting system prior to prescribing opiate medications. No inconsistencies noted.   Meds ordered this encounter  Medications  . Oxycodone HCl 20 MG TABS    Sig: Take 1 tablet (20 mg total) by mouth every 4 (four) hours as needed.    Dispense:  60 tablet    Refill:  0    Order Specific Question:   Supervising Provider    Answer:   [name], [name] [[id number]]     Lysa Livengood [name] Kaye Luoma  APRN, MSN, FNP-C [facility] [address]  [place], [place] [address] [phone]  

## 2019-08-29 NOTE — Telephone Encounter (Signed)
Please review  Thank you

## 2019-09-03 ENCOUNTER — Other Ambulatory Visit: Payer: Self-pay

## 2019-09-03 ENCOUNTER — Encounter: Payer: Self-pay | Admitting: Diagnostic Neuroimaging

## 2019-09-03 ENCOUNTER — Ambulatory Visit: Payer: Medicaid Other | Admitting: Diagnostic Neuroimaging

## 2019-09-03 VITALS — BP 117/72 | HR 75 | Ht 72.0 in | Wt 170.2 lb

## 2019-09-03 DIAGNOSIS — G43109 Migraine with aura, not intractable, without status migrainosus: Secondary | ICD-10-CM

## 2019-09-03 DIAGNOSIS — R519 Headache, unspecified: Secondary | ICD-10-CM | POA: Diagnosis not present

## 2019-09-03 MED ORDER — NURTEC 75 MG PO TBDP: 75.0000 mg | ORAL_TABLET | Freq: Every day | ORAL | 6 refills | Status: DC | PRN

## 2019-09-03 MED ORDER — TOPIRAMATE 50 MG PO TABS: 50.0000 mg | ORAL_TABLET | Freq: Two times a day (BID) | ORAL | 12 refills | Status: DC

## 2019-09-03 NOTE — Progress Notes (Addendum)
[facility]  PATIENT: Paul Campos DOB: 22-May-1990  REFERRING CLINICIAN: Massie Maroon, FNP HISTORY FROM: patient  REASON FOR VISIT: new consult    HISTORICAL  CHIEF COMPLAINT:  Chief Complaint  Patient presents with  . Headache    rm 7 New Pt "headaches for several months, generalized, take Fioricet with 100% relief, Ibuprofen dulls it"     HISTORY OF PRESENT ILLNESS:   28 year old male with sickle cell disease, here for evaluation of headaches.  Patient reports new onset of pulsating, throbbing and pressure headache since December 2020.  Headaches are in the frontal region but radiate to the sides of his head and back of his head.  No nausea or vomiting.  Sometimes he sees spots and blurred vision.  Headaches can last hours or days at a time.  He has 3 headaches per week.  No specific triggering factors.  He has tried Fioricet and over-the-counter medicines without relief.  Patient has family history of migraine in his mother.  No visual aura, numbness or tingling sensations.   REVIEW OF SYSTEMS: Full 14 system review of systems performed and negative with exception of: As per HPI.  ALLERGIES: Allergies  Allergen Reactions  . Dilaudid [Hydromorphone Hcl] Hives  . Hydromorphone Itching    Morphine okay.  . Cefotaxime Itching and Rash    Hives    HOME MEDICATIONS: Outpatient Medications Prior to Visit  Medication Sig Dispense Refill  . butalbital-acetaminophen-caffeine (FIORICET) 50-325-40 MG tablet Take 1 tablet by mouth every 6 (six) hours as needed for headache. 30 tablet 2  . ergocalciferol (VITAMIN D2) 1.25 MG (50000 UT) capsule Take 1 capsule (50,000 Units total) by mouth once a week. 12 capsule 1  . folic acid (FOLVITE) 1 MG tablet Take 1 tablet (1 mg total) by mouth daily. 30 tablet 11  . hydroxyurea (HYDREA) 500 MG capsule Take 500 mg by mouth daily. May take with food to minimize GI side effects.    Marland Kitchen ibuprofen (ADVIL) 800 MG tablet  Take 1 tablet (800 mg total) by mouth every 8 (eight) hours as needed. 60 tablet 1  . Oxycodone HCl 20 MG TABS Take 1 tablet (20 mg total) by mouth every 4 (four) hours as needed. 60 tablet 0  . voxelotor (OXBRYTA) 500 MG TABS tablet Take 1,000 mg by mouth daily. 90 tablet 1   No facility-administered medications prior to visit.    PAST MEDICAL HISTORY: Past Medical History:  Diagnosis Date  . Headache   . Sickle cell anemia (HCC)     PAST SURGICAL HISTORY: Past Surgical History:  Procedure Laterality Date  . APPENDECTOMY      FAMILY HISTORY: Family History  Problem Relation Age of Onset  . Lung cancer Mother   . Migraines Mother     SOCIAL HISTORY: Social History   Socioeconomic History  . Marital status: Single    Spouse name: Not on file  . Number of children: 1  . Years of education: Not on file  . Highest education level: High school graduate  Occupational History    Comment: none 09/03/19  Tobacco Use  . Smoking status: Never Smoker  . Smokeless tobacco: Never Used  Vaping Use  . Vaping Use: Never used  Substance and Sexual Activity  . Alcohol use: No  . Drug use: No  . Sexual activity: Not on file  Other Topics Concern  . Not on file  Social History Narrative   Caffeine- maybe coffee every other morning  Social Determinants of Health   Financial Resource Strain:   . Difficulty of Paying Living Expenses:   Food Insecurity:   . Worried About Programme researcher, broadcasting/film/video in the Last Year:   . Barista in the Last Year:   Transportation Needs:   . Freight forwarder (Medical):   Marland Kitchen Lack of Transportation (Non-Medical):   Physical Activity:   . Days of Exercise per Week:   . Minutes of Exercise per Session:   Stress:   . Feeling of Stress :   Social Connections:   . Frequency of Communication with Friends and Family:   . Frequency of Social Gatherings with Friends and Family:   . Attends Religious Services:   . Active Member of Clubs or  Organizations:   . Attends Banker Meetings:   Marland Kitchen Marital Status:   Intimate Partner Violence:   . Fear of Current or Ex-Partner:   . Emotionally Abused:   Marland Kitchen Physically Abused:   . Sexually Abused:      PHYSICAL EXAM  GENERAL EXAM/CONSTITUTIONAL: Vitals:  Vitals:   09/03/19 0813  BP: 117/72  Pulse: 75  Weight: 170 lb 3.2 oz (77.2 kg)  Height: 6' (1.829 m)     Body mass index is 23.08 kg/m. Wt Readings from Last 3 Encounters:  09/03/19 170 lb 3.2 oz (77.2 kg)  07/10/19 182 lb 3.2 oz (82.6 kg)  05/18/19 175 lb (79.4 kg)     Patient is in no distress; well developed, nourished and groomed; neck is supple  CARDIOVASCULAR:  Examination of carotid arteries is normal; no carotid bruits  Regular rate and rhythm, no murmurs  Examination of peripheral vascular system by observation and palpation is normal  EYES:  Ophthalmoscopic exam of optic discs and posterior segments is normal; no papilledema or hemorrhages  No exam data present  MUSCULOSKELETAL:  Gait, strength, tone, movements noted in Neurologic exam below  NEUROLOGIC: MENTAL STATUS:  No flowsheet data found.  awake, alert, oriented to person, place and time  recent and remote memory intact  normal attention and concentration  language fluent, comprehension intact, naming intact  fund of knowledge appropriate  CRANIAL NERVE:   2nd - no papilledema on fundoscopic exam  2nd, 3rd, 4th, 6th - pupils equal and reactive to light, visual fields full to confrontation, extraocular muscles intact, no nystagmus  5th - facial sensation symmetric  7th - facial strength symmetric  8th - hearing intact  9th - palate elevates symmetrically, uvula midline  11th - shoulder shrug symmetric  12th - tongue protrusion midline  MOTOR:   normal bulk and tone, full strength in the BUE, BLE  SENSORY:   normal and symmetric to light touch, temperature, vibration  COORDINATION:    finger-nose-finger, fine finger movements normal  REFLEXES:   deep tendon reflexes present and symmetric  GAIT/STATION:   narrow based gait     DIAGNOSTIC DATA (LABS, IMAGING, TESTING) - I reviewed patient records, labs, notes, testing and imaging myself where available.  Lab Results  Component Value Date   WBC 7.8 07/10/2019   HGB 12.8 (L) 07/10/2019   HCT 39.1 07/10/2019   MCV 79 07/10/2019   PLT 166 07/10/2019      Component Value Date/Time   NA 140 07/10/2019 0952   K 4.6 07/10/2019 0952   CL 105 07/10/2019 0952   CO2 24 07/10/2019 0952   GLUCOSE 90 07/10/2019 0952   GLUCOSE 90 05/18/2019 1014   BUN 5 (L)  07/10/2019 0952   CREATININE 0.83 07/10/2019 0952   CALCIUM 9.0 07/10/2019 0952   PROT 7.1 07/10/2019 0952   ALBUMIN 4.4 07/10/2019 0952   AST 21 07/10/2019 0952   ALT 11 07/10/2019 0952   ALKPHOS 76 07/10/2019 0952   BILITOT 0.6 07/10/2019 0952   GFRNONAA 121 07/10/2019 0952   GFRAA 139 07/10/2019 0952   No results found for: CHOL, HDL, LDLCALC, LDLDIRECT, TRIG, CHOLHDL No results found for: QBVQ9I No results found for: VITAMINB12 No results found for: TSH     ASSESSMENT AND PLAN  29 y.o. year old male here with sickle cell disease, now with new onset headache since December 2020.  We will proceed with further work-up.  Also will try empiric migraine treatments.  Dx:  1. New onset headache   2. Migraine with aura and without status migrainosus, not intractable     PLAN:  NEW ONSET HEADACHES - check MRI brain   MIGRAINE WITH AURA TREATMENT PLAN:  MIGRAINE PREVENTION  LIFESTYLE CHANGES -Stop or avoid smoking -Decrease or avoid caffeine / alcohol -Eat and sleep on a regular schedule -Exercise several times per week - start topiramate 50mg  at bedtime; after 1-2 weeks increase to 50mg  twice a day; drink plenty of water  2nd line - may consider fremanezumab (Ajovy) 225mg  monthly (or 675mg  every 3 months) - may consider galazanezumab  (Emgality) 240mg  loading dose; then 120mg  monthly   MIGRAINE RESCUE  - ibuprofen, tylenol as needed  CGRP  - start rimegepant (Nurtec) 75mg  as needed for breakthrough headache; max 8 per month  TRIPTANS --> avoid due to sickle cell disease   Orders Placed This Encounter  Procedures  . MR BRAIN W WO CONTRAST   Meds ordered this encounter  Medications  . topiramate (TOPAMAX) 50 MG tablet    Sig: Take 1 tablet (50 mg total) by mouth 2 (two) times daily.    Dispense:  60 tablet    Refill:  12  . Rimegepant Sulfate (NURTEC) 75 MG TBDP    Sig: Take 75 mg by mouth daily as needed.    Dispense:  8 tablet    Refill:  6   Return in about 6 months (around 03/05/2020) for with NP (Amy Lomax).    , MD 09/03/2019, 8:56 AM Certified in Neurology, Neurophysiology and Neuroimaging  [facility] [address], [address] [place], 05/03/2020 [phone]

## 2019-09-03 NOTE — Patient Instructions (Signed)
-   check MRI brain   MIGRAINE PREVENTION  LIFESTYLE CHANGES -Stop or avoid smoking -Decrease or avoid caffeine / alcohol -Eat and sleep on a regular schedule -Exercise several times per week - start topiramate 50mg  at bedtime; after 1-2 weeks increase to 50mg  twice a day; drink plenty of water  MIGRAINE RESCUE  - ibuprofen, tylenol as needed - start rimegepant (Nurtec) 75mg  as needed for breakthrough headache; max 8 per month

## 2019-09-05 ENCOUNTER — Telehealth: Payer: Self-pay | Admitting: *Deleted

## 2019-09-05 ENCOUNTER — Telehealth: Payer: Self-pay | Admitting: Diagnostic Neuroimaging

## 2019-09-05 NOTE — Telephone Encounter (Signed)
[employer] healthy blue medicaid pending

## 2019-09-05 NOTE — Telephone Encounter (Signed)
Nurtec PA, key: [id number], [id number], failed Fiorecet, OTCs, can't take triptans due to sickle cell anemia. 12 headaches a month.  Approved: PA Case: [id number], Status: Approved, Coverage Starts on: 09/05/2019 12:00:00 AM, Coverage Ends on: 09/04/2020 12:00:00 AM.  Approval faxed to [facility], sent my chart to advise.

## 2019-09-12 ENCOUNTER — Other Ambulatory Visit: Payer: Self-pay

## 2019-09-12 ENCOUNTER — Other Ambulatory Visit: Payer: Self-pay | Admitting: Family Medicine

## 2019-09-12 DIAGNOSIS — D571 Sickle-cell disease without crisis: Secondary | ICD-10-CM

## 2019-09-12 DIAGNOSIS — G8929 Other chronic pain: Secondary | ICD-10-CM

## 2019-09-12 MED ORDER — OXYCODONE HCL 20 MG PO TABS: 20.0000 mg | ORAL_TABLET | ORAL | 0 refills | Status: DC | PRN

## 2019-09-12 NOTE — Progress Notes (Signed)
Reviewed PDMP substance reporting system prior to prescribing opiate medications. No inconsistencies noted.   Meds ordered this encounter  Medications  . Oxycodone HCl 20 MG TABS    Sig: Take 1 tablet (20 mg total) by mouth every 4 (four) hours as needed.    Dispense:  60 tablet    Refill:  0    Order Specific Question:   Supervising Provider    Answer:   [name], [name] [[id number]]     Paul Campos Paul Jolicoeur  APRN, MSN, FNP-C [facility] [address]  [place], [place] [address] [phone]  

## 2019-09-12 NOTE — Telephone Encounter (Signed)
Refill request for oxycodone. Please advise.  

## 2019-09-17 NOTE — Telephone Encounter (Signed)
healthy blue medicaid auth: NCW7/14/21 to 11/04/19) order sent to GI. They will reach out to the patient to schedule.

## 2019-10-02 ENCOUNTER — Other Ambulatory Visit: Payer: Self-pay | Admitting: Family Medicine

## 2019-10-02 ENCOUNTER — Other Ambulatory Visit: Payer: Self-pay

## 2019-10-02 DIAGNOSIS — G8929 Other chronic pain: Secondary | ICD-10-CM

## 2019-10-02 DIAGNOSIS — D571 Sickle-cell disease without crisis: Secondary | ICD-10-CM

## 2019-10-02 MED ORDER — OXYCODONE HCL 20 MG PO TABS: 20.0000 mg | ORAL_TABLET | ORAL | 0 refills | Status: DC | PRN

## 2019-10-02 NOTE — Telephone Encounter (Signed)
Refill request for oxycodone. Please advise.  

## 2019-10-02 NOTE — Progress Notes (Signed)
Reviewed PDMP substance reporting system prior to prescribing opiate medications. No inconsistencies noted.   Meds ordered this encounter  Medications  . Oxycodone HCl 20 MG TABS    Sig: Take 1 tablet (20 mg total) by mouth every 4 (four) hours as needed.    Dispense:  60 tablet    Refill:  0    Order Specific Question:   Supervising Provider    Answer:   [name], [name] [[id number]]     Paul Campos Paul Quilter  APRN, MSN, FNP-C [facility] [address]  [place], [place] [address] [phone]  

## 2019-10-16 ENCOUNTER — Other Ambulatory Visit (HOSPITAL_COMMUNITY): Payer: Self-pay | Admitting: Family Medicine

## 2019-10-16 ENCOUNTER — Other Ambulatory Visit: Payer: Self-pay

## 2019-10-16 ENCOUNTER — Encounter: Payer: Self-pay | Admitting: Family Medicine

## 2019-10-16 ENCOUNTER — Ambulatory Visit (INDEPENDENT_AMBULATORY_CARE_PROVIDER_SITE_OTHER): Payer: Medicaid Other | Admitting: Family Medicine

## 2019-10-16 VITALS — BP 112/73 | HR 94 | Temp 97.4°F | Ht 72.0 in | Wt 171.0 lb

## 2019-10-16 DIAGNOSIS — D571 Sickle-cell disease without crisis: Secondary | ICD-10-CM

## 2019-10-16 DIAGNOSIS — E559 Vitamin D deficiency, unspecified: Secondary | ICD-10-CM

## 2019-10-16 DIAGNOSIS — G8929 Other chronic pain: Secondary | ICD-10-CM | POA: Diagnosis not present

## 2019-10-16 LAB — POCT URINALYSIS DIPSTICK
Bilirubin, UA: NEGATIVE
Blood, UA: NEGATIVE
Glucose, UA: NEGATIVE
Ketones, UA: NEGATIVE
Nitrite, UA: NEGATIVE
Protein, UA: NEGATIVE
Spec Grav, UA: 1.02 (ref 1.010–1.025)
Urobilinogen, UA: 0.2 E.U./dL
pH, UA: 6.5 (ref 5.0–8.0)

## 2019-10-16 NOTE — Patient Instructions (Signed)
Sickle Cell Anemia, Adult  Sickle cell anemia is a condition where your red blood cells are shaped like sickles. Red blood cells carry oxygen through the body. Sickle-shaped cells do not live as long as normal red blood cells. They also clump together and block blood from flowing through the blood vessels. This prevents the body from getting enough oxygen. Sickle cell anemia causes organ damage and pain. It also increases the risk of infection. Follow these instructions at home: Medicines  Take over-the-counter and prescription medicines only as told by your doctor.  If you were prescribed an antibiotic medicine, take it as told by your doctor. Do not stop taking the antibiotic even if you start to feel better.  If you develop a fever, do not take medicines to lower the fever right away. Tell your doctor about the fever. Managing pain, stiffness, and swelling  Try these methods to help with pain: ? Use a heating pad. ? Take a warm bath. ? Distract yourself, such as by watching TV. Eating and drinking  Drink enough fluid to keep your pee (urine) clear or pale yellow. Drink more in hot weather and during exercise.  Limit or avoid alcohol.  Eat a healthy diet. Eat plenty of fruits, vegetables, whole grains, and lean protein.  Take vitamins and supplements as told by your doctor. Traveling  When traveling, keep these with you: ? Your medical information. ? The names of your doctors. ? Your medicines.  If you need to take an airplane, talk to your doctor first. Activity  Rest often.  Avoid exercises that make your heart beat much faster, such as jogging. General instructions  Do not use products that have nicotine or tobacco, such as cigarettes and e-cigarettes. If you need help quitting, ask your doctor.  Consider wearing a medical alert bracelet.  Avoid being in high places (high altitudes), such as mountains.  Avoid very hot or cold temperatures.  Avoid places where the  temperature changes a lot.  Keep all follow-up visits as told by your doctor. This is important. Contact a doctor if:  A joint hurts.  Your feet or hands hurt or swell.  You feel tired (fatigued). Get help right away if:  You have symptoms of infection. These include: ? Fever. ? Chills. ? Being very tired. ? Irritability. ? Poor eating. ? Throwing up (vomiting).  You feel dizzy or faint.  You have new stomach pain, especially on the left side.  You have a an erection (priapism) that lasts more than 4 hours.  You have numbness in your arms or legs.  You have a hard time moving your arms or legs.  You have trouble talking.  You have pain that does not go away when you take medicine.  You are short of breath.  You are breathing fast.  You have a long-term cough.  You have pain in your chest.  You have a bad headache.  You have a stiff neck.  Your stomach looks bloated even though you did not eat much.  Your skin is pale.  You suddenly cannot see well. Summary  Sickle cell anemia is a condition where your red blood cells are shaped like sickles.  Follow your doctor's advice on ways to manage pain, food to eat, activities to do, and steps to take for safe travel.  Get medical help right away if you have any signs of infection, such as a fever. This information is not intended to replace advice given to you by   your health care provider. Make sure you discuss any questions you have with your health care provider. Document Revised: 06/02/2018 Document Reviewed: 03/16/2016 Elsevier Patient Education  2020 Elsevier Inc.  

## 2019-10-17 LAB — CMP14+CBC/D/PLT+FER+RETIC+V...
ALT: 13 IU/L (ref 0–44)
AST: 21 IU/L (ref 0–40)
Albumin/Globulin Ratio: 1.4 (ref 1.2–2.2)
Albumin: 4.2 g/dL (ref 4.1–5.2)
Alkaline Phosphatase: 71 IU/L (ref 48–121)
BUN/Creatinine Ratio: 5 — ABNORMAL LOW (ref 9–20)
BUN: 4 mg/dL — ABNORMAL LOW (ref 6–20)
Basophils Absolute: 0.1 10*3/uL (ref 0.0–0.2)
Basos: 1 %
Bilirubin Total: 0.6 mg/dL (ref 0.0–1.2)
CO2: 22 mmol/L (ref 20–29)
Calcium: 9.1 mg/dL (ref 8.7–10.2)
Chloride: 106 mmol/L (ref 96–106)
Creatinine, Ser: 0.87 mg/dL (ref 0.76–1.27)
EOS (ABSOLUTE): 0.5 10*3/uL — ABNORMAL HIGH (ref 0.0–0.4)
Eos: 5 %
Ferritin: 184 ng/mL (ref 30–400)
GFR calc Af Amer: 137 mL/min/{1.73_m2} (ref >59)
GFR calc non Af Amer: 118 mL/min/{1.73_m2} (ref >59)
Globulin, Total: 2.9 g/dL (ref 1.5–4.5)
Glucose: 95 mg/dL (ref 65–99)
Hematocrit: 36.4 % — ABNORMAL LOW (ref 37.5–51.0)
Hemoglobin: 12 g/dL — ABNORMAL LOW (ref 13.0–17.7)
Immature Grans (Abs): 0 10*3/uL (ref 0.0–0.1)
Immature Granulocytes: 0 %
Lymphocytes Absolute: 2.6 10*3/uL (ref 0.7–3.1)
Lymphs: 24 %
MCH: 25.6 pg — ABNORMAL LOW (ref 26.6–33.0)
MCHC: 33 g/dL (ref 31.5–35.7)
MCV: 78 fL — ABNORMAL LOW (ref 79–97)
Monocytes Absolute: 1.3 10*3/uL — ABNORMAL HIGH (ref 0.1–0.9)
Monocytes: 12 %
NRBC: 1 % — ABNORMAL HIGH (ref 0–0)
Neutrophils Absolute: 6.2 10*3/uL (ref 1.4–7.0)
Neutrophils: 58 %
Platelets: 172 10*3/uL (ref 150–450)
Potassium: 3.9 mmol/L (ref 3.5–5.2)
RBC: 4.69 x10E6/uL (ref 4.14–5.80)
RDW: 18.1 % — ABNORMAL HIGH (ref 11.6–15.4)
Retic Ct Pct: 4.4 % — ABNORMAL HIGH (ref 0.6–2.6)
Sodium: 144 mmol/L (ref 134–144)
Total Protein: 7.1 g/dL (ref 6.0–8.5)
Vit D, 25-Hydroxy: 10.8 ng/mL — ABNORMAL LOW (ref 30.0–100.0)
WBC: 10.6 10*3/uL (ref 3.4–10.8)

## 2019-10-22 ENCOUNTER — Other Ambulatory Visit: Payer: Self-pay

## 2019-10-22 ENCOUNTER — Other Ambulatory Visit: Payer: Self-pay | Admitting: Family Medicine

## 2019-10-22 DIAGNOSIS — D571 Sickle-cell disease without crisis: Secondary | ICD-10-CM

## 2019-10-22 DIAGNOSIS — G8929 Other chronic pain: Secondary | ICD-10-CM

## 2019-10-22 NOTE — Telephone Encounter (Signed)
Can this patient receive a refill

## 2019-10-23 ENCOUNTER — Other Ambulatory Visit: Payer: Self-pay

## 2019-10-23 DIAGNOSIS — G8929 Other chronic pain: Secondary | ICD-10-CM

## 2019-10-23 DIAGNOSIS — D571 Sickle-cell disease without crisis: Secondary | ICD-10-CM

## 2019-10-24 ENCOUNTER — Other Ambulatory Visit: Payer: Self-pay | Admitting: Family Medicine

## 2019-10-24 DIAGNOSIS — D571 Sickle-cell disease without crisis: Secondary | ICD-10-CM

## 2019-10-24 DIAGNOSIS — G8929 Other chronic pain: Secondary | ICD-10-CM

## 2019-10-24 MED ORDER — OXYCODONE HCL 20 MG PO TABS
20.0000 mg | ORAL_TABLET | ORAL | 0 refills | Status: DC | PRN
Start: 2019-10-24 — End: 2019-11-10

## 2019-10-24 NOTE — Progress Notes (Signed)
Reviewed PDMP substance reporting system prior to prescribing opiate medications. No inconsistencies noted.   Meds ordered this encounter  Medications  . Oxycodone HCl 20 MG TABS    Sig: Take 1 tablet (20 mg total) by mouth every 4 (four) hours as needed.    Dispense:  60 tablet    Refill:  0    Order Specific Question:   Supervising Provider    Answer:   [name], [name] [[id number]]     Glorya Bartley [name] Sevannah Madia  APRN, MSN, FNP-C [facility] [address]  [place], [place] [address] [phone]  

## 2019-10-25 LAB — DRUG SCREEN 764883 11+OXYCO+ALC+CRT-BUND
BENZODIAZ UR QL: NEGATIVE ng/mL
Barbiturate: NEGATIVE ng/mL
Cocaine (Metabolite): NEGATIVE ng/mL
Creatinine: 164.8 mg/dL (ref 20.0–300.0)
Ethanol: NEGATIVE %
Meperidine: NEGATIVE ng/mL
Methadone Screen, Urine: NEGATIVE ng/mL
OPIATE SCREEN URINE: NEGATIVE ng/mL
Oxycodone/Oxymorphone, Urine: NEGATIVE ng/mL
Phencyclidine: NEGATIVE ng/mL
Propoxyphene: NEGATIVE ng/mL
Tramadol: NEGATIVE ng/mL
pH, Urine: 6.4 (ref 4.5–8.9)

## 2019-10-25 LAB — DRUG PROFILE 799016
Amphetamine: NEGATIVE
Amphetamines: POSITIVE — AB
Methamphetamine: POSITIVE — AB

## 2019-10-25 LAB — D/L METHAMPHETAMINE
D Methamphetamine: 97 %
L Methamphetamine: 3 %
Methamphetamine Quant, Ur: 2944 ng/mL

## 2019-10-25 LAB — CANNABINOID CONFIRMATION, UR
CANNABINOIDS: POSITIVE — AB
Carboxy THC GC/MS Conf: 85 ng/mL

## 2019-11-02 NOTE — Progress Notes (Signed)
[facility]    Established Patient Office Visit  Subjective:  Patient ID: Paul Campos, male    DOB: 01-Mar-1990  Age: 29 y.o. MRN: [id number]  CC:  Chief Complaint  Patient presents with  . Follow-up    HPI Paul Campos is a very pleasant 29 year old male with a medical history significant for sickle cell disease, chronic pain syndrome, opiate dependence and tolerance, history of migraine headaches, and history of anemia of chronic disease presents for medication management and follow-up of sickle cell.  Patient says that he has been doing well on current medication regimen.  He has infrequent pain crises.  Pain continues to be controlled on oxycodone.  Patient has a history of migraine headaches and has been followed consistently by neurology.  He is currently experiencing low back pain characterized as intermittent and aching.  Pain intensity is 5/10.  He last had oxycodone this a.m. with moderate relief.  He denies any headache, chest pain, urinary symptoms, nausea, vomiting, or diarrhea.  He denies any sick contacts, recent travel, or exposure to COVID-19.  Past Medical History:  Diagnosis Date  . Headache   . Sickle cell anemia (HCC)     Past Surgical History:  Procedure Laterality Date  . APPENDECTOMY      Family History  Problem Relation Age of Onset  . Lung cancer Mother   . Migraines Mother     Social History   Socioeconomic History  . Marital status: Single    Spouse name: Not on file  . Number of children: 1  . Years of education: Not on file  . Highest education level: High school graduate  Occupational History    Comment: none 09/03/19  Tobacco Use  . Smoking status: Never Smoker  . Smokeless tobacco: Never Used  Vaping Use  . Vaping Use: Never used  Substance and Sexual Activity  . Alcohol use: No  . Drug use: No  . Sexual activity: Not on file  Other Topics Concern  . Not on file  Social History  Narrative   Caffeine- maybe coffee every other morning   Social Determinants of Health   Financial Resource Strain:   . Difficulty of Paying Living Expenses: Not on file  Food Insecurity:   . Worried About Programme researcher, broadcasting/film/video in the Last Year: Not on file  . Ran Out of Food in the Last Year: Not on file  Transportation Needs:   . Lack of Transportation (Medical): Not on file  . Lack of Transportation (Non-Medical): Not on file  Physical Activity:   . Days of Exercise per Week: Not on file  . Minutes of Exercise per Session: Not on file  Stress:   . Feeling of Stress : Not on file  Social Connections:   . Frequency of Communication with Friends and Family: Not on file  . Frequency of Social Gatherings with Friends and Family: Not on file  . Attends Religious Services: Not on file  . Active Member of Clubs or Organizations: Not on file  . Attends Banker Meetings: Not on file  . Marital Status: Not on file  Intimate Partner Violence:   . Fear of Current or Ex-Partner: Not on file  . Emotionally Abused: Not on file  . Physically Abused: Not on file  . Sexually Abused: Not on file    Outpatient Medications Prior to Visit  Medication Sig Dispense Refill  . ergocalciferol (VITAMIN D2) 1.25 MG (50000  UT) capsule Take 1 capsule (50,000 Units total) by mouth once a week. 12 capsule 1  . folic acid (FOLVITE) 1 MG tablet Take 1 tablet (1 mg total) by mouth daily. 30 tablet 11  . hydroxyurea (HYDREA) 500 MG capsule Take 500 mg by mouth daily. May take with food to minimize GI side effects.    Marland Kitchen ibuprofen (ADVIL) 800 MG tablet Take 1 tablet (800 mg total) by mouth every 8 (eight) hours as needed. 60 tablet 1  . Rimegepant Sulfate (NURTEC) 75 MG TBDP Take 75 mg by mouth daily as needed. 8 tablet 6  . topiramate (TOPAMAX) 50 MG tablet Take 1 tablet (50 mg total) by mouth 2 (two) times daily. 60 tablet 12  . voxelotor (OXBRYTA) 500 MG TABS tablet Take 1,000 mg by mouth daily. 90  tablet 1  . butalbital-acetaminophen-caffeine (FIORICET) 50-325-40 MG tablet Take 1 tablet by mouth every 6 (six) hours as needed for headache. 30 tablet 2  . Oxycodone HCl 20 MG TABS Take 1 tablet (20 mg total) by mouth every 4 (four) hours as needed. 60 tablet 0   No facility-administered medications prior to visit.    Allergies  Allergen Reactions  . Dilaudid [Hydromorphone Hcl] Hives  . Hydromorphone Itching    Morphine okay.  . Cefotaxime Itching and Rash    Hives    ROS Review of Systems  Constitutional: Negative.  Negative for fatigue and fever.  HENT: Negative.   Eyes: Negative.   Respiratory: Negative.   Cardiovascular: Negative.  Negative for chest pain and leg swelling.  Gastrointestinal: Negative.   Endocrine: Negative.   Genitourinary: Negative.   Musculoskeletal: Positive for arthralgias and back pain.  Neurological: Negative.   Psychiatric/Behavioral: Negative.       Objective:    Physical Exam Constitutional:      Appearance: Normal appearance.  Eyes:     Pupils: Pupils are equal, round, and reactive to light.  Cardiovascular:     Rate and Rhythm: Normal rate and regular rhythm.     Pulses: Normal pulses.  Pulmonary:     Effort: Pulmonary effort is normal.  Abdominal:     General: Abdomen is flat. Bowel sounds are normal.  Musculoskeletal:        General: Normal range of motion.  Skin:    General: Skin is warm.  Neurological:     General: No focal deficit present.     Mental Status: He is alert. Mental status is at baseline.  Psychiatric:        Mood and Affect: Mood normal.        Behavior: Behavior normal.        Thought Content: Thought content normal.        Judgment: Judgment normal.     BP 112/73   Pulse 94   Temp (!) 97.4 F (36.3 C) (Temporal)   Ht 6' (1.829 m)   Wt 171 lb (77.6 kg)   SpO2 99%   BMI 23.19 kg/m  Wt Readings from Last 3 Encounters:  10/16/19 171 lb (77.6 kg)  09/03/19 170 lb 3.2 oz (77.2 kg)  07/10/19 182  lb 3.2 oz (82.6 kg)     Health Maintenance Due  Topic Date Due  . Hepatitis C Screening  Never done  . COVID-19 Vaccine (1) Never done  . INFLUENZA VACCINE  09/23/2019    There are no preventive care reminders to display for this patient.  No results found for: TSH Lab Results  Component Value  Date   WBC 10.6 10/16/2019   HGB 12.0 (L) 10/16/2019   HCT 36.4 (L) 10/16/2019   MCV 78 (L) 10/16/2019   PLT 172 10/16/2019   Lab Results  Component Value Date   NA 144 10/16/2019   K 3.9 10/16/2019   CO2 22 10/16/2019   GLUCOSE 95 10/16/2019   BUN 4 (L) 10/16/2019   CREATININE 0.87 10/16/2019   BILITOT 0.6 10/16/2019   ALKPHOS 71 10/16/2019   AST 21 10/16/2019   ALT 13 10/16/2019   PROT 7.1 10/16/2019   ALBUMIN 4.2 10/16/2019   CALCIUM 9.1 10/16/2019   ANIONGAP 9 05/18/2019   No results found for: CHOL No results found for: HDL No results found for: LDLCALC No results found for: TRIG No results found for: CHOLHDL No results found for: BZMC8Y    Assessment & Plan:   Problem List Items Addressed This Visit      Other   Hb-SS disease without crisis (HCC)   Relevant Orders   Sickle Cell Panel (Completed)   POCT urinalysis dipstick (Completed)    Other Visit Diagnoses    Other chronic pain    -  Primary   Relevant Orders   [id number] 11+Oxyco+Alc+Crt-Bund (Completed)   Sickle Cell Panel (Completed)   Vitamin D deficiency          Other chronic pain Reviewed PDMP substance reporting system prior to prescribing opiate medications. No inconsistencies noted.   - [id number] 11+Oxyco+Alc+Crt-Bund - Sickle Cell Panel  Hb-SS disease without crisis (HCC) Sickle cell disease - Continue Hydrea. We discussed the need for good hydration, monitoring of hydration status, avoidance of heat, cold, stress, and infection triggers. We discussed the risks and benefits of Hydrea, including bone marrow suppression, the possibility of GI upset, skin ulcers, hair thinning, and  teratogenicity. The patient was reminded of the need to seek medical attention of any symptoms of bleeding, anemia, or infection. Continue folic acid 1 mg daily to prevent aplastic bone marrow crises.   Pulmonary evaluation - Patient denies severe recurrent wheezes, shortness of breath with exercise, or persistent cough. If these symptoms develop, pulmonary function tests with spirometry will be ordered, and if abnormal, plan on referral to Pulmonology for further evaluation.  Cardiac - Routine screening for pulmonary hypertension is not recommended.  Eye - High risk of proliferative retinopathy. Annual eye exam with retinal exam recommended to patient.  Immunization status - Patient declines vaccinations on today.    Pain is controlled on current medication regimen, no medication changes warranted at this time.   - Sickle Cell Panel - POCT urinalysis dipstick  Vitamin D deficiency Patient has a history of vitamin D deficiency, we will follow-up by phone with laboratory results.  Follow-up: Return in about 3 months (around 01/16/2020).    Nolon Nations  APRN, MSN, FNP-C Patient Care [facility] [address] [place], [place] [address] [phone]

## 2019-11-07 ENCOUNTER — Other Ambulatory Visit: Payer: Self-pay

## 2019-11-07 DIAGNOSIS — G8929 Other chronic pain: Secondary | ICD-10-CM

## 2019-11-07 DIAGNOSIS — D571 Sickle-cell disease without crisis: Secondary | ICD-10-CM

## 2019-11-09 ENCOUNTER — Other Ambulatory Visit: Payer: Self-pay

## 2019-11-09 DIAGNOSIS — D571 Sickle-cell disease without crisis: Secondary | ICD-10-CM

## 2019-11-09 DIAGNOSIS — G8929 Other chronic pain: Secondary | ICD-10-CM

## 2019-11-10 ENCOUNTER — Other Ambulatory Visit: Payer: Self-pay | Admitting: Family Medicine

## 2019-11-10 DIAGNOSIS — D571 Sickle-cell disease without crisis: Secondary | ICD-10-CM

## 2019-11-10 DIAGNOSIS — G8929 Other chronic pain: Secondary | ICD-10-CM

## 2019-11-10 MED ORDER — OXYCODONE HCL 20 MG PO TABS
20.0000 mg | ORAL_TABLET | ORAL | 0 refills | Status: DC | PRN
Start: 2019-11-10 — End: 2019-12-24

## 2019-11-10 NOTE — Progress Notes (Signed)
Reviewed PDMP substance reporting system prior to prescribing opiate medications. No inconsistencies noted.   Meds ordered this encounter  Medications  . Oxycodone HCl 20 MG TABS    Sig: Take 1 tablet (20 mg total) by mouth every 4 (four) hours as needed.    Dispense:  60 tablet    Refill:  0    Order Specific Question:   Supervising Provider    Answer:   [name], [name] [[id number]]     Paul Mcpheeters [name] Dijon Cosens  APRN, MSN, FNP-C [facility] [address]  [place], [place] [address] [phone]  

## 2019-11-23 ENCOUNTER — Other Ambulatory Visit: Payer: Self-pay

## 2019-11-23 DIAGNOSIS — G8929 Other chronic pain: Secondary | ICD-10-CM

## 2019-11-23 DIAGNOSIS — D571 Sickle-cell disease without crisis: Secondary | ICD-10-CM

## 2019-11-24 ENCOUNTER — Other Ambulatory Visit: Payer: Self-pay

## 2019-11-24 DIAGNOSIS — G8929 Other chronic pain: Secondary | ICD-10-CM

## 2019-11-24 DIAGNOSIS — D571 Sickle-cell disease without crisis: Secondary | ICD-10-CM

## 2019-11-26 ENCOUNTER — Other Ambulatory Visit: Payer: Self-pay

## 2019-11-26 DIAGNOSIS — D571 Sickle-cell disease without crisis: Secondary | ICD-10-CM

## 2019-11-26 DIAGNOSIS — G8929 Other chronic pain: Secondary | ICD-10-CM

## 2019-11-29 ENCOUNTER — Other Ambulatory Visit: Payer: Self-pay

## 2019-11-29 DIAGNOSIS — D571 Sickle-cell disease without crisis: Secondary | ICD-10-CM

## 2019-11-29 DIAGNOSIS — G8929 Other chronic pain: Secondary | ICD-10-CM

## 2019-12-04 ENCOUNTER — Other Ambulatory Visit: Payer: Self-pay

## 2019-12-04 DIAGNOSIS — G8929 Other chronic pain: Secondary | ICD-10-CM

## 2019-12-04 DIAGNOSIS — D571 Sickle-cell disease without crisis: Secondary | ICD-10-CM

## 2019-12-12 ENCOUNTER — Other Ambulatory Visit: Payer: Self-pay

## 2019-12-12 DIAGNOSIS — D571 Sickle-cell disease without crisis: Secondary | ICD-10-CM

## 2019-12-12 DIAGNOSIS — G8929 Other chronic pain: Secondary | ICD-10-CM

## 2019-12-24 ENCOUNTER — Other Ambulatory Visit: Payer: Self-pay | Admitting: Family Medicine

## 2019-12-24 DIAGNOSIS — G8929 Other chronic pain: Secondary | ICD-10-CM

## 2019-12-24 DIAGNOSIS — D571 Sickle-cell disease without crisis: Secondary | ICD-10-CM

## 2019-12-24 MED ORDER — OXYCODONE HCL 20 MG PO TABS
20.0000 mg | ORAL_TABLET | ORAL | 0 refills | Status: DC | PRN
Start: 2019-12-24 — End: 2020-01-15

## 2019-12-24 NOTE — Progress Notes (Signed)
Reviewed PDMP substance reporting system prior to prescribing opiate medications. No inconsistencies noted.   Meds ordered this encounter  Medications  . Oxycodone HCl 20 MG TABS    Sig: Take 1 tablet (20 mg total) by mouth every 4 (four) hours as needed.    Dispense:  60 tablet    Refill:  0    Order Specific Question:   Supervising Provider    Answer:   [name], [name] [[id number]]     Maigen Mozingo [name] Jacklyne Baik  APRN, MSN, FNP-C [facility] [address]  [place], [place] [address] [phone]  

## 2019-12-24 NOTE — Telephone Encounter (Signed)
Patient is requesting a refill of the following medications: Requested Prescriptions   Pending Prescriptions Disp Refills  . Oxycodone HCl 20 MG TABS 60 tablet 0    Sig: Take 1 tablet (20 mg total) by mouth every 4 (four) hours as needed.    Date of patient request: 12/04/2019 Last office visit: 10/16/2019 Date of last refill: 11/10/2019 Last refill amount:  60 tablets  Follow up time period per chart: 01/15/2020

## 2020-01-04 ENCOUNTER — Encounter (HOSPITAL_COMMUNITY): Payer: Self-pay | Admitting: Emergency Medicine

## 2020-01-04 ENCOUNTER — Other Ambulatory Visit: Payer: Self-pay

## 2020-01-04 ENCOUNTER — Emergency Department (HOSPITAL_COMMUNITY)
Admission: EM | Admit: 2020-01-04 | Discharge: 2020-01-04 | Disposition: A | Discharge status: Home / Self Care | Payer: Medicaid Other | Attending: Emergency Medicine | Admitting: Emergency Medicine

## 2020-01-04 DIAGNOSIS — M545 Low back pain, unspecified: Secondary | ICD-10-CM | POA: Diagnosis not present

## 2020-01-04 DIAGNOSIS — D57219 Sickle-cell/Hb-C disease with crisis, unspecified: Secondary | ICD-10-CM | POA: Diagnosis not present

## 2020-01-04 DIAGNOSIS — M79604 Pain in right leg: Secondary | ICD-10-CM | POA: Diagnosis not present

## 2020-01-04 DIAGNOSIS — D5700 Hb-SS disease with crisis, unspecified: Secondary | ICD-10-CM

## 2020-01-04 DIAGNOSIS — M79605 Pain in left leg: Secondary | ICD-10-CM | POA: Diagnosis present

## 2020-01-04 LAB — CBC WITH DIFFERENTIAL/PLATELET
Abs Immature Granulocytes: 0.15 10*3/uL — ABNORMAL HIGH (ref 0.00–0.07)
Basophils Absolute: 0.1 10*3/uL (ref 0.0–0.1)
Basophils Relative: 1 %
Eosinophils Absolute: 0.8 10*3/uL — ABNORMAL HIGH (ref 0.0–0.5)
Eosinophils Relative: 8 %
HCT: 32.7 % — ABNORMAL LOW (ref 39.0–52.0)
Hemoglobin: 11 g/dL — ABNORMAL LOW (ref 13.0–17.0)
Immature Granulocytes: 2 %
Lymphocytes Relative: 29 %
Lymphs Abs: 2.8 10*3/uL (ref 0.7–4.0)
MCH: 24.8 pg — ABNORMAL LOW (ref 26.0–34.0)
MCHC: 33.6 g/dL (ref 30.0–36.0)
MCV: 73.8 fL — ABNORMAL LOW (ref 80.0–100.0)
Monocytes Absolute: 1.4 10*3/uL — ABNORMAL HIGH (ref 0.1–1.0)
Monocytes Relative: 15 %
Neutro Abs: 4.6 10*3/uL (ref 1.7–7.7)
Neutrophils Relative %: 45 %
Platelets: 250 10*3/uL (ref 150–400)
RBC: 4.43 MIL/uL (ref 4.22–5.81)
RDW: 15.1 % (ref 11.5–15.5)
WBC: 9.8 10*3/uL (ref 4.0–10.5)
nRBC: 1.1 % — ABNORMAL HIGH (ref 0.0–0.2)

## 2020-01-04 LAB — COMPREHENSIVE METABOLIC PANEL
ALT: 17 U/L (ref 0–44)
AST: 22 U/L (ref 15–41)
Albumin: 4.5 g/dL (ref 3.5–5.0)
Alkaline Phosphatase: 63 U/L (ref 38–126)
Anion gap: 9 (ref 5–15)
BUN: 5 mg/dL — ABNORMAL LOW (ref 6–20)
CO2: 26 mmol/L (ref 22–32)
Calcium: 9.1 mg/dL (ref 8.9–10.3)
Chloride: 100 mmol/L (ref 98–111)
Creatinine, Ser: 0.78 mg/dL (ref 0.61–1.24)
GFR, Estimated: >60 mL/min (ref >60)
Glucose, Bld: 91 mg/dL (ref 70–99)
Potassium: 4.2 mmol/L (ref 3.5–5.1)
Sodium: 135 mmol/L (ref 135–145)
Total Bilirubin: 1.2 mg/dL (ref 0.3–1.2)
Total Protein: 8.3 g/dL — ABNORMAL HIGH (ref 6.5–8.1)

## 2020-01-04 LAB — RETICULOCYTES
Immature Retic Fract: 36 % — ABNORMAL HIGH (ref 2.3–15.9)
RBC.: 4.38 MIL/uL (ref 4.22–5.81)
Retic Count, Absolute: 249.7 10*3/uL — ABNORMAL HIGH (ref 19.0–186.0)
Retic Ct Pct: 5.7 % — ABNORMAL HIGH (ref 0.4–3.1)

## 2020-01-04 MED ORDER — ONDANSETRON HCL 4 MG/2ML IJ SOLN: 4.0000 mg | INTRAMUSCULAR | Status: DC | PRN

## 2020-01-04 MED ORDER — OXYCODONE HCL 5 MG PO TABS: 20.0000 mg | ORAL_TABLET | Freq: Once | ORAL | Status: DC | PRN

## 2020-01-04 MED ORDER — MORPHINE SULFATE (PF) 4 MG/ML IV SOLN
8.0000 mg | INTRAVENOUS | Status: AC
  Administered 2020-01-04: 8 mg via SUBCUTANEOUS
  Filled 2020-01-04: qty 2

## 2020-01-04 MED ORDER — DIPHENHYDRAMINE HCL 25 MG PO CAPS: 25.0000 mg | ORAL_CAPSULE | ORAL | Status: DC | PRN

## 2020-01-04 MED ORDER — SODIUM CHLORIDE 0.45 % IV SOLN: INTRAVENOUS | Status: DC

## 2020-01-04 MED ORDER — KETOROLAC TROMETHAMINE 15 MG/ML IJ SOLN
15.0000 mg | INTRAMUSCULAR | Status: AC
  Administered 2020-01-04: 15 mg via INTRAVENOUS
  Filled 2020-01-04: qty 1

## 2020-01-04 MED ORDER — MORPHINE SULFATE (PF) 4 MG/ML IV SOLN
6.0000 mg | INTRAVENOUS | Status: AC
  Administered 2020-01-04: 6 mg via SUBCUTANEOUS
  Filled 2020-01-04: qty 2

## 2020-01-04 NOTE — ED Provider Notes (Signed)
[facility]-EMERGENCY DEPT Provider Note   CSN: [id number] Arrival date & time: 01/04/20  1243     History No chief complaint on file.   Paul Campos is a 29 y.o. male.  29 year old male with history of sickle cell anemia presents with complaint of pain in his back and legs, onset 7AM. Patient took his 20mg  oxycodone and came to the ER after he did not get any relief. Denies swelling, redness, fevers, chest pain, abdominal pain, shortness of breath or any other complaints or concerns. Reports compliance with his medications. Patient tried to go to the sickle cell clinic/day hospital but wasn't sure where it was or how to arrange treatment. Pain similar to prior pain crises. No other complaints or concerns.         Past Medical History:  Diagnosis Date  . Headache   . Sickle cell anemia [facility])     Patient Active Problem List   Diagnosis Date Noted  . Nausea & vomiting 11/05/2018  . Sickle cell anemia with crisis (HCC) 11/04/2018  . 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 Hypokalemia 08/21/2012  . Thrombocytopenia, unspecified (HCC) 08/19/2012  . CAP (community acquired pneumonia) 08/18/2012  . Sickle cell pain crisis (HCC) 05/06/2012  . Chest pain 05/06/2012    Past Surgical History:  Procedure Laterality Date  . APPENDECTOMY         Family History  Problem Relation Age of Onset  . Lung cancer Mother   . Migraines Mother     Social History   Tobacco Use  . Smoking status: Never Smoker  . Smokeless tobacco: Never Used  Vaping Use  . Vaping Use: Never used  Substance Use Topics  . Alcohol use: No  . Drug use: No    Home Medications Prior to Admission medications   Medication Sig Start Date End Date Taking? Authorizing Provider  folic acid (FOLVITE) 1 MG tablet Take 1 tablet (1 mg total) by mouth daily. 07/27/18  Yes 09/26/18, FNP  hydroxyurea (HYDREA) 500 MG capsule Take 500 mg by mouth daily. May take with food to  minimize GI side effects.   Yes [provider]  ibuprofen (ADVIL) 800 MG tablet Take 1 tablet (800 mg total) by mouth every 8 (eight) hours as needed. Patient taking differently: Take 800 mg by mouth every 8 (eight) hours as needed for moderate pain.  02/06/19  Yes 02/08/19, FNP  Oxycodone HCl 20 MG TABS Take 1 tablet (20 mg total) by mouth every 4 (four) hours as needed. Patient taking differently: Take 20 mg by mouth every 4 (four) hours as needed (pain).  12/24/19  Yes 13/1/21, FNP  Rimegepant Sulfate (NURTEC) 75 MG TBDP Take 75 mg by mouth daily as needed. Patient taking differently: Take 75 mg by mouth daily as needed (headache).  09/03/19  Yes Penumalli, 11/04/19, MD  topiramate (TOPAMAX) 50 MG tablet Take 1 tablet (50 mg total) by mouth 2 (two) times daily. Patient taking differently: Take 50 mg by mouth 2 (two) times daily as needed (headache).  09/03/19  Yes Penumalli, 11/04/19, MD  ergocalciferol (VITAMIN D2) 1.25 MG (50000 UT) capsule Take 1 capsule (50,000 Units total) by mouth once a week. Patient not taking: Reported on 01/04/2020 07/12/19   07/14/19, FNP  voxelotor (OXBRYTA) 500 MG TABS tablet Take 1,000 mg by mouth daily. Patient not taking: Reported on 01/04/2020 04/10/19   04/12/19, FNP  Allergies    Dilaudid [hydromorphone hcl], Hydromorphone, and Cefotaxime  Review of Systems   Review of Systems  Constitutional: Negative for chills and fever.  Respiratory: Negative for shortness of breath.   Cardiovascular: Negative for chest pain.  Gastrointestinal: Negative for abdominal pain, constipation, diarrhea, nausea and vomiting.  Genitourinary: Negative for difficulty urinating.  Musculoskeletal: Positive for arthralgias, back pain and myalgias.  Skin: Negative for color change, rash and wound.  Allergic/Immunologic: Positive for immunocompromised state.  Neurological: Negative for weakness and numbness.  All other systems  reviewed and are negative.   Physical Exam Updated Vital Signs BP 109/76   Pulse 74   Temp 98.5 F (36.9 C) (Oral)   Resp 20   Ht 6' (1.829 m)   Wt 74.8 kg   SpO2 98%   BMI 22.38 kg/m   Physical Exam Vitals and nursing note reviewed.  Constitutional:      General: He is not in acute distress.    Appearance: He is well-developed. He is not diaphoretic.  HENT:     Head: Normocephalic and atraumatic.  Cardiovascular:     Rate and Rhythm: Normal rate and regular rhythm.     Pulses: Normal pulses.     Heart sounds: Normal heart sounds.  Pulmonary:     Effort: Pulmonary effort is normal.     Breath sounds: Normal breath sounds.  Chest:     Chest wall: No tenderness.  Abdominal:     Palpations: Abdomen is soft.     Tenderness: There is no abdominal tenderness.  Musculoskeletal:        General: Tenderness present. No swelling.     Right lower leg: No edema.     Left lower leg: No edema.     Comments: Generalized tenderness across low back and with palpation of the legs without swelling, redness, skin changes.   Skin:    General: Skin is warm and dry.     Findings: No erythema or rash.  Neurological:     Mental Status: He is alert and oriented to person, place, and time.  Psychiatric:        Behavior: Behavior normal.     ED Results / Procedures / Treatments   Labs (all labs ordered are listed, but only abnormal results are displayed) Labs Reviewed  COMPREHENSIVE METABOLIC PANEL - Abnormal; Notable for the following components:      Result Value   BUN 5 (*)    Total Protein 8.3 (*)    All other components within normal limits  CBC WITH DIFFERENTIAL/PLATELET - Abnormal; Notable for the following components:   Hemoglobin 11.0 (*)    HCT 32.7 (*)    MCV 73.8 (*)    MCH 24.8 (*)    nRBC 1.1 (*)    All other components within normal limits  RETICULOCYTES - Abnormal; Notable for the following components:   Retic Ct Pct 5.7 (*)    Retic Count, Absolute 249.7 (*)     Immature Retic Fract 36.0 (*)    All other components within normal limits    EKG None  Radiology No results found.  Procedures Procedures (including critical care time)  Medications Ordered in ED Medications  0.45 % sodium chloride infusion ( Intravenous New Bag/Given 01/04/20 1547)  diphenhydrAMINE (BENADRYL) capsule 25 mg (has no administration in time range)  ondansetron (ZOFRAN) injection 4 mg (has no administration in time range)  oxyCODONE (Oxy IR/ROXICODONE) immediate release tablet 20 mg (has no administration in time range)  ketorolac (TORADOL) 15 MG/ML injection 15 mg (15 mg Intravenous Given 01/04/20 1548)  morphine 4 MG/ML injection 6 mg (6 mg Subcutaneous Given 01/04/20 1521)  morphine 4 MG/ML injection 8 mg (8 mg Subcutaneous Given 01/04/20 1558)    ED Course  I have reviewed the triage vital signs and the nursing notes.  Pertinent labs & imaging results that were available during my care of the patient were reviewed by me and considered in my medical decision making (see chart for details).  Clinical Course as of Jan 03 1653  Fri Jan 04, 2020  812 29 year old male with history of sickle cell anemia presents with pain crisis with pain in his low back and legs.  No fevers, no chest or abdominal pain, nausea or vomiting.  Pain reportedly similar to prior pain crises, not improving with home oxycodone.  On exam, found to have tenderness diffuse legs and low back without swelling or erythema.  Patient was given subcu morphine with improvement in his pain, also given fluids and Toradol.  Plan is to follow-up with his sickle cell clinic.   [LM]    Clinical Course User Index [LM] Alden Hipp   MDM Rules/Calculators/A&P                          Final Clinical Impression(s) / ED Diagnoses Final diagnoses:  Sickle cell crisis [facility])    Rx / DC Orders ED Discharge Orders    None       Jeannie Fend, PA-C 01/04/20 1654    Sabas Sous,  MD 01/04/20 2332

## 2020-01-04 NOTE — Discharge Instructions (Addendum)
Follow up with your provider. Continue with home medications as directed.

## 2020-01-04 NOTE — ED Triage Notes (Signed)
C/C sickle cell pain crisis since 7am this morning, pain in lower back and bilateral legs. Denies SOB or CP. Home meds w/o relief.

## 2020-01-07 ENCOUNTER — Other Ambulatory Visit: Payer: Self-pay

## 2020-01-07 DIAGNOSIS — D571 Sickle-cell disease without crisis: Secondary | ICD-10-CM

## 2020-01-07 DIAGNOSIS — G8929 Other chronic pain: Secondary | ICD-10-CM

## 2020-01-09 ENCOUNTER — Other Ambulatory Visit: Payer: Self-pay

## 2020-01-09 DIAGNOSIS — D571 Sickle-cell disease without crisis: Secondary | ICD-10-CM

## 2020-01-09 DIAGNOSIS — G8929 Other chronic pain: Secondary | ICD-10-CM

## 2020-01-15 ENCOUNTER — Other Ambulatory Visit: Payer: Self-pay | Admitting: Family Medicine

## 2020-01-15 ENCOUNTER — Encounter: Payer: Self-pay | Admitting: Family Medicine

## 2020-01-15 ENCOUNTER — Other Ambulatory Visit: Payer: Self-pay

## 2020-01-15 ENCOUNTER — Ambulatory Visit (INDEPENDENT_AMBULATORY_CARE_PROVIDER_SITE_OTHER): Payer: Medicaid Other | Admitting: Family Medicine

## 2020-01-15 VITALS — BP 114/75 | HR 76 | Temp 98.8°F | Resp 16 | Ht 72.0 in | Wt 176.0 lb

## 2020-01-15 DIAGNOSIS — Z1159 Encounter for screening for other viral diseases: Secondary | ICD-10-CM

## 2020-01-15 DIAGNOSIS — E559 Vitamin D deficiency, unspecified: Secondary | ICD-10-CM

## 2020-01-15 DIAGNOSIS — G8929 Other chronic pain: Secondary | ICD-10-CM

## 2020-01-15 DIAGNOSIS — D571 Sickle-cell disease without crisis: Secondary | ICD-10-CM

## 2020-01-15 LAB — POCT URINALYSIS DIPSTICK
Bilirubin, UA: NEGATIVE
Blood, UA: NEGATIVE
Glucose, UA: NEGATIVE
Ketones, UA: NEGATIVE
Leukocytes, UA: NEGATIVE
Nitrite, UA: NEGATIVE
Protein, UA: NEGATIVE
Spec Grav, UA: 1.025 (ref 1.010–1.025)
Urobilinogen, UA: 0.2 E.U./dL
pH, UA: 6 (ref 5.0–8.0)

## 2020-01-15 MED ORDER — FOLIC ACID 1 MG PO TABS: 1.0000 mg | ORAL_TABLET | Freq: Every day | ORAL | 11 refills | Status: DC

## 2020-01-15 MED ORDER — FOLIC ACID 800 MCG PO TABS: 800.0000 ug | ORAL_TABLET | Freq: Every day | ORAL | 3 refills | Status: DC

## 2020-01-15 MED ORDER — OXYCODONE HCL 20 MG PO TABS: 20.0000 mg | ORAL_TABLET | ORAL | 0 refills | Status: DC | PRN

## 2020-01-15 MED ORDER — HYDROXYUREA 500 MG PO CAPS: 500.0000 mg | ORAL_CAPSULE | Freq: Every day | ORAL | 2 refills | Status: DC

## 2020-01-15 NOTE — Patient Instructions (Signed)
Sickle Cell Anemia, Adult  Sickle cell anemia is a condition where your red blood cells are shaped like sickles. Red blood cells carry oxygen through the body. Sickle-shaped cells do not live as long as normal red blood cells. They also clump together and block blood from flowing through the blood vessels. This prevents the body from getting enough oxygen. Sickle cell anemia causes organ damage and pain. It also increases the risk of infection. Follow these instructions at home: Medicines  Take over-the-counter and prescription medicines only as told by your doctor.  If you were prescribed an antibiotic medicine, take it as told by your doctor. Do not stop taking the antibiotic even if you start to feel better.  If you develop a fever, do not take medicines to lower the fever right away. Tell your doctor about the fever. Managing pain, stiffness, and swelling  Try these methods to help with pain: ? Use a heating pad. ? Take a warm bath. ? Distract yourself, such as by watching TV. Eating and drinking  Drink enough fluid to keep your pee (urine) clear or pale yellow. Drink more in hot weather and during exercise.  Limit or avoid alcohol.  Eat a healthy diet. Eat plenty of fruits, vegetables, whole grains, and lean protein.  Take vitamins and supplements as told by your doctor. Traveling  When traveling, keep these with you: ? Your medical information. ? The names of your doctors. ? Your medicines.  If you need to take an airplane, talk to your doctor first. Activity  Rest often.  Avoid exercises that make your heart beat much faster, such as jogging. General instructions  Do not use products that have nicotine or tobacco, such as cigarettes and e-cigarettes. If you need help quitting, ask your doctor.  Consider wearing a medical alert bracelet.  Avoid being in high places (high altitudes), such as mountains.  Avoid very hot or cold temperatures.  Avoid places where the  temperature changes a lot.  Keep all follow-up visits as told by your doctor. This is important. Contact a doctor if:  A joint hurts.  Your feet or hands hurt or swell.  You feel tired (fatigued). Get help right away if:  You have symptoms of infection. These include: ? Fever. ? Chills. ? Being very tired. ? Irritability. ? Poor eating. ? Throwing up (vomiting).  You feel dizzy or faint.  You have new stomach pain, especially on the left side.  You have a an erection (priapism) that lasts more than 4 hours.  You have numbness in your arms or legs.  You have a hard time moving your arms or legs.  You have trouble talking.  You have pain that does not go away when you take medicine.  You are short of breath.  You are breathing fast.  You have a long-term cough.  You have pain in your chest.  You have a bad headache.  You have a stiff neck.  Your stomach looks bloated even though you did not eat much.  Your skin is pale.  You suddenly cannot see well. Summary  Sickle cell anemia is a condition where your red blood cells are shaped like sickles.  Follow your doctor's advice on ways to manage pain, food to eat, activities to do, and steps to take for safe travel.  Get medical help right away if you have any signs of infection, such as a fever. This information is not intended to replace advice given to you by   your health care provider. Make sure you discuss any questions you have with your health care provider. Document Revised: 06/02/2018 Document Reviewed: 03/16/2016 Elsevier Patient Education  2020 Elsevier Inc.  

## 2020-01-15 NOTE — Progress Notes (Signed)
Reviewed PDMP substance reporting system prior to prescribing opiate medications. No inconsistencies noted.   Meds ordered this encounter  Medications  . Oxycodone HCl 20 MG TABS    Sig: Take 1 tablet (20 mg total) by mouth every 4 (four) hours as needed.    Dispense:  60 tablet    Refill:  0    Order Specific Question:   Supervising Provider    Answer:   [name], [name] [[id number]]     Paul Lawes [name] Kalimah Capurro  APRN, MSN, FNP-C [facility] [address]  [place], [place] [address] [phone]  

## 2020-01-15 NOTE — Progress Notes (Signed)
[facility]   Established Patient Office Visit  Subjective:  Patient ID: Paul Campos, male    DOB: 1990-04-28  Age: 29 y.o. MRN: [id number]  CC: Sickle cell and chronic pain follow-up  HPI Paul Campos is a 29 year old male with a medical history significant for sickle cell disease, chronic pain syndrome, opiate dependence and tolerance, vitamin D deficiency and anemia of chronic disease presents for follow-up of chronic conditions.  Patient says that he is doing well and is without complaint.  Patient says that he is not having any pain on today.  He last had oxycodone 2 days ago.  He denies any headache, chest pain, urinary symptoms, nausea, vomiting, or diarrhea.  Patient is a non-smoker.  He hydrates consistently.  Also, patient consistently takes folic acid and hydroxyurea.  Past Medical History:  Diagnosis Date   Headache    Sickle cell anemia (HCC)     Past Surgical History:  Procedure Laterality Date   APPENDECTOMY      Family History  Problem Relation Age of Onset   Lung cancer Mother    Migraines Mother     Social History   Socioeconomic History   Marital status: Single    Spouse name: Not on file   Number of children: 1   Years of education: Not on file   Highest education level: High school graduate  Occupational History    Comment: none 09/03/19  Tobacco Use   Smoking status: Never Smoker   Smokeless tobacco: Never Used  Vaping Use   Vaping Use: Never used  Substance and Sexual Activity   Alcohol use: No   Drug use: No   Sexual activity: Not on file  Other Topics Concern   Not on file  Social History Narrative   Caffeine- maybe coffee every other morning   Social Determinants of Health   Financial Resource Strain:    Difficulty of Paying Living Expenses: Not on file  Food Insecurity:    Worried About Running Out of Food in the Last Year: Not on file   [employer] of Food in the  Last Year: Not on file  Transportation Needs:    Lack of Transportation (Medical): Not on file   Lack of Transportation (Non-Medical): Not on file  Physical Activity:    Days of Exercise per Week: Not on file   Minutes of Exercise per Session: Not on file  Stress:    Feeling of Stress : Not on file  Social Connections:    Frequency of Communication with Friends and Family: Not on file   Frequency of Social Gatherings with Friends and Family: Not on file   Attends Religious Services: Not on file   Active Member of Clubs or Organizations: Not on file   Attends Banker Meetings: Not on file   Marital Status: Not on file  Intimate Partner Violence:    Fear of Current or Ex-Partner: Not on file   Emotionally Abused: Not on file   Physically Abused: Not on file   Sexually Abused: Not on file    Outpatient Medications Prior to Visit  Medication Sig Dispense Refill   ergocalciferol (VITAMIN D2) 1.25 MG (50000 UT) capsule Take 1 capsule (50,000 Units total) by mouth once a week. (Patient not taking: Reported on 01/04/2020) 12 capsule 1   folic acid (FOLVITE) 1 MG tablet Take 1 tablet (1 mg total) by mouth daily. 30 tablet 11   hydroxyurea (  HYDREA) 500 MG capsule Take 500 mg by mouth daily. May take with food to minimize GI side effects.     ibuprofen (ADVIL) 800 MG tablet Take 1 tablet (800 mg total) by mouth every 8 (eight) hours as needed. (Patient taking differently: Take 800 mg by mouth every 8 (eight) hours as needed for moderate pain. ) 60 tablet 1   Oxycodone HCl 20 MG TABS Take 1 tablet (20 mg total) by mouth every 4 (four) hours as needed. (Patient taking differently: Take 20 mg by mouth every 4 (four) hours as needed (pain). ) 60 tablet 0   Rimegepant Sulfate (NURTEC) 75 MG TBDP Take 75 mg by mouth daily as needed. (Patient taking differently: Take 75 mg by mouth daily as needed (headache). ) 8 tablet 6   topiramate (TOPAMAX) 50 MG tablet Take 1  tablet (50 mg total) by mouth 2 (two) times daily. (Patient taking differently: Take 50 mg by mouth 2 (two) times daily as needed (headache). ) 60 tablet 12   voxelotor (OXBRYTA) 500 MG TABS tablet Take 1,000 mg by mouth daily. (Patient not taking: Reported on 01/04/2020) 90 tablet 1   No facility-administered medications prior to visit.    Allergies  Allergen Reactions   Dilaudid [Hydromorphone Hcl] Hives   Hydromorphone Itching    Morphine okay.   Cefotaxime Itching and Rash    Hives    ROS Review of Systems  Constitutional: Negative.   HENT: Negative.   Eyes: Negative.   Respiratory: Negative.   Cardiovascular: Negative.   Gastrointestinal: Negative.   Endocrine: Negative.   Genitourinary: Negative.   Musculoskeletal: Positive for back pain and joint swelling.  Skin: Negative.   Neurological: Negative.   Hematological: Negative.   Psychiatric/Behavioral: Negative.       Objective:    Physical Exam Constitutional:      Appearance: Normal appearance.  Eyes:     Pupils: Pupils are equal, round, and reactive to light.  Cardiovascular:     Rate and Rhythm: Normal rate and regular rhythm.     Pulses: Normal pulses.  Pulmonary:     Effort: Pulmonary effort is normal.  Abdominal:     General: Abdomen is flat. Bowel sounds are normal.  Neurological:     General: No focal deficit present.     Mental Status: He is alert and oriented to person, place, and time. Mental status is at baseline.  Psychiatric:        Mood and Affect: Mood normal.        Behavior: Behavior normal.        Thought Content: Thought content normal.        Judgment: Judgment normal.     There were no vitals taken for this visit. Wt Readings from Last 3 Encounters:  01/04/20 165 lb (74.8 kg)  10/16/19 171 lb (77.6 kg)  09/03/19 170 lb 3.2 oz (77.2 kg)     Health Maintenance Due  Topic Date Due   Hepatitis C Screening  Never done   COVID-19 Vaccine (1) Never done   INFLUENZA  VACCINE  09/23/2019    There are no preventive care reminders to display for this patient.  No results found for: TSH Lab Results  Component Value Date   WBC 9.8 01/04/2020   HGB 11.0 (L) 01/04/2020   HCT 32.7 (L) 01/04/2020   MCV 73.8 (L) 01/04/2020   PLT 250 01/04/2020   Lab Results  Component Value Date   NA 135 01/04/2020  K 4.2 01/04/2020   CO2 26 01/04/2020   GLUCOSE 91 01/04/2020   BUN 5 (L) 01/04/2020   CREATININE 0.78 01/04/2020   BILITOT 1.2 01/04/2020   ALKPHOS 63 01/04/2020   AST 22 01/04/2020   ALT 17 01/04/2020   PROT 8.3 (H) 01/04/2020   ALBUMIN 4.5 01/04/2020   CALCIUM 9.1 01/04/2020   ANIONGAP 9 01/04/2020   No results found for: CHOL No results found for: HDL No results found for: LDLCALC No results found for: TRIG No results found for: CHOLHDL No results found for: EXBM8U    Assessment & Plan:   Problem List Items Addressed This Visit      Other   Hb-SS disease without crisis (HCC)   Relevant Orders   Sickle Cell Panel    Other Visit Diagnoses    Other chronic pain    -  Primary   Relevant Orders   132440 11+Oxyco+Alc+Crt-Bund   Urinalysis Dipstick   Vitamin D deficiency       Relevant Orders   Sickle Cell Panel     1. Hb-SS disease without crisis (HCC) Sickle cell disease - Continue Hydrea 500 mg daily. We discussed the need for good hydration, monitoring of hydration status, avoidance of heat, cold, stress, and infection triggers. We discussed the risks and benefits of Hydrea, including bone marrow suppression, the possibility of GI upset, skin ulcers, hair thinning, and teratogenicity. The patient was reminded of the need to seek medical attention of any symptoms of bleeding, anemia, or infection. Continue folic acid 1 mg daily to prevent aplastic bone marrow crises.   Pulmonary evaluation - Patient denies severe recurrent wheezes, shortness of breath with exercise, or persistent cough. If these symptoms develop, pulmonary function  tests with spirometry will be ordered, and if abnormal, plan on referral to Pulmonology for further evaluation.  Cardiac - Routine screening for pulmonary hypertension is not recommended.  Eye - High risk of proliferative retinopathy. Annual eye exam with retinal exam recommended to patient.   2. Other chronic pain  - 102725 11+Oxyco+Alc+Crt-Bund - Urinalysis Dipstick  3. Vitamin D deficiency - Sickle Cell Panel  4. Need for hepatitis C screening test - Hepatitis C Antibody  Follow-up: Return in about 3 months (around 04/16/2020) for sickle cell anemia.       Nolon Nations  APRN, MSN, FNP-C [facility] [address] [place], [place] [address] [phone]

## 2020-01-16 LAB — CMP14+CBC/D/PLT+FER+RETIC+V...
ALT: 11 IU/L (ref 0–44)
AST: 14 IU/L (ref 0–40)
Albumin/Globulin Ratio: 1.6 (ref 1.2–2.2)
Albumin: 4.5 g/dL (ref 4.1–5.2)
Alkaline Phosphatase: 69 IU/L (ref 44–121)
BUN/Creatinine Ratio: 5 — ABNORMAL LOW (ref 9–20)
BUN: 4 mg/dL — ABNORMAL LOW (ref 6–20)
Basophils Absolute: 0.1 10*3/uL (ref 0.0–0.2)
Basos: 1 %
Bilirubin Total: 0.5 mg/dL (ref 0.0–1.2)
CO2: 23 mmol/L (ref 20–29)
Calcium: 9.7 mg/dL (ref 8.7–10.2)
Chloride: 104 mmol/L (ref 96–106)
Creatinine, Ser: 0.84 mg/dL (ref 0.76–1.27)
EOS (ABSOLUTE): 0.4 10*3/uL (ref 0.0–0.4)
Eos: 5 %
Ferritin: 185 ng/mL (ref 30–400)
GFR calc Af Amer: 139 mL/min/{1.73_m2} (ref >59)
GFR calc non Af Amer: 120 mL/min/{1.73_m2} (ref >59)
Globulin, Total: 2.8 g/dL (ref 1.5–4.5)
Glucose: 74 mg/dL (ref 65–99)
Hematocrit: 39.2 % (ref 37.5–51.0)
Hemoglobin: 12.6 g/dL — ABNORMAL LOW (ref 13.0–17.7)
Immature Grans (Abs): 0 10*3/uL (ref 0.0–0.1)
Immature Granulocytes: 0 %
Lymphocytes Absolute: 1.8 10*3/uL (ref 0.7–3.1)
Lymphs: 23 %
MCH: 25.4 pg — ABNORMAL LOW (ref 26.6–33.0)
MCHC: 32.1 g/dL (ref 31.5–35.7)
MCV: 79 fL (ref 79–97)
Monocytes Absolute: 0.9 10*3/uL (ref 0.1–0.9)
Monocytes: 12 %
Neutrophils Absolute: 4.8 10*3/uL (ref 1.4–7.0)
Neutrophils: 59 %
Platelets: 209 10*3/uL (ref 150–450)
Potassium: 3.7 mmol/L (ref 3.5–5.2)
RBC: 4.96 x10E6/uL (ref 4.14–5.80)
RDW: 17.9 % — ABNORMAL HIGH (ref 11.6–15.4)
Retic Ct Pct: 4.4 % — ABNORMAL HIGH (ref 0.6–2.6)
Sodium: 142 mmol/L (ref 134–144)
Total Protein: 7.3 g/dL (ref 6.0–8.5)
Vit D, 25-Hydroxy: 8.8 ng/mL — ABNORMAL LOW (ref 30.0–100.0)
WBC: 8 10*3/uL (ref 3.4–10.8)

## 2020-01-16 LAB — HEPATITIS C ANTIBODY: Hep C Virus Ab: <0.1 s/co ratio (ref 0.0–0.9)

## 2020-01-18 LAB — DRUG SCREEN 764883 11+OXYCO+ALC+CRT-BUND

## 2020-01-19 ENCOUNTER — Other Ambulatory Visit: Payer: Self-pay | Admitting: Family Medicine

## 2020-01-19 DIAGNOSIS — E559 Vitamin D deficiency, unspecified: Secondary | ICD-10-CM

## 2020-01-19 MED ORDER — ERGOCALCIFEROL 1.25 MG (50000 UT) PO CAPS: 50000.0000 [IU] | ORAL_CAPSULE | ORAL | 1 refills | Status: DC

## 2020-01-19 NOTE — Progress Notes (Signed)
Meds ordered this encounter  Medications   ergocalciferol (VITAMIN D2) 1.25 MG (50000 UT) capsule    Sig: Take 1 capsule (50,000 Units total) by mouth once a week.    Dispense:  12 capsule    Refill:  1    Order Specific Question:   Supervising Provider    Answer:   [name], [name] [[id number]]   Windi Toro [name] Arna Luis  APRN, MSN, FNP-C [facility] [address]  [place], [place] [address] [phone]  

## 2020-01-25 LAB — DRUG SCREEN 764883 11+OXYCO+ALC+CRT-BUND
Amphetamines, Urine: NEGATIVE ng/mL
BENZODIAZ UR QL: NEGATIVE ng/mL
Barbiturate: NEGATIVE ng/mL
Cocaine (Metabolite): NEGATIVE ng/mL
Creatinine: 145 mg/dL (ref 20.0–300.0)
Ethanol: NEGATIVE %
Meperidine: NEGATIVE ng/mL
Methadone Screen, Urine: NEGATIVE ng/mL
OPIATE SCREEN URINE: NEGATIVE ng/mL
Oxycodone/Oxymorphone, Urine: NEGATIVE ng/mL
Phencyclidine: NEGATIVE ng/mL
Propoxyphene: NEGATIVE ng/mL
Tramadol: NEGATIVE ng/mL
pH, Urine: 5.7 (ref 4.5–8.9)

## 2020-01-25 LAB — CANNABINOID CONFIRMATION, UR
CANNABINOIDS: POSITIVE — AB
Carboxy THC GC/MS Conf: 71 ng/mL

## 2020-01-28 ENCOUNTER — Other Ambulatory Visit: Payer: Self-pay

## 2020-01-28 DIAGNOSIS — D571 Sickle-cell disease without crisis: Secondary | ICD-10-CM

## 2020-01-28 DIAGNOSIS — G8929 Other chronic pain: Secondary | ICD-10-CM

## 2020-02-04 ENCOUNTER — Other Ambulatory Visit: Payer: Self-pay

## 2020-02-04 DIAGNOSIS — D571 Sickle-cell disease without crisis: Secondary | ICD-10-CM

## 2020-02-04 DIAGNOSIS — G8929 Other chronic pain: Secondary | ICD-10-CM

## 2020-02-04 NOTE — Telephone Encounter (Signed)
Please see refill request

## 2020-02-06 ENCOUNTER — Other Ambulatory Visit: Payer: Self-pay | Admitting: Family Medicine

## 2020-02-06 DIAGNOSIS — D571 Sickle-cell disease without crisis: Secondary | ICD-10-CM

## 2020-02-06 DIAGNOSIS — G8929 Other chronic pain: Secondary | ICD-10-CM

## 2020-02-06 MED ORDER — OXYCODONE HCL 20 MG PO TABS: 20.0000 mg | ORAL_TABLET | ORAL | 0 refills | Status: DC | PRN

## 2020-02-06 NOTE — Progress Notes (Signed)
Reviewed PDMP substance reporting system prior to prescribing opiate medications. No inconsistencies noted.   Meds ordered this encounter  Medications  . Oxycodone HCl 20 MG TABS    Sig: Take 1 tablet (20 mg total) by mouth every 4 (four) hours as needed.    Dispense:  60 tablet    Refill:  0    Order Specific Question:   Supervising Provider    Answer:   [name], [name] [[id number]]     Raychelle Hudman [name] Marlena Barbato  APRN, MSN, FNP-C [facility] [address]  [place], [place] [address] [phone]  

## 2020-02-25 ENCOUNTER — Other Ambulatory Visit: Payer: Self-pay

## 2020-02-25 DIAGNOSIS — G8929 Other chronic pain: Secondary | ICD-10-CM

## 2020-02-25 DIAGNOSIS — D571 Sickle-cell disease without crisis: Secondary | ICD-10-CM

## 2020-02-26 ENCOUNTER — Other Ambulatory Visit: Payer: Self-pay | Admitting: Family Medicine

## 2020-02-26 DIAGNOSIS — D571 Sickle-cell disease without crisis: Secondary | ICD-10-CM

## 2020-02-26 DIAGNOSIS — G8929 Other chronic pain: Secondary | ICD-10-CM

## 2020-02-26 MED ORDER — OXYCODONE HCL 20 MG PO TABS: 20.0000 mg | ORAL_TABLET | ORAL | 0 refills | Status: DC | PRN

## 2020-02-26 NOTE — Progress Notes (Signed)
Reviewed PDMP substance reporting system prior to prescribing opiate medications. No inconsistencies noted.   Meds ordered this encounter  Medications  . Oxycodone HCl 20 MG TABS    Sig: Take 1 tablet (20 mg total) by mouth every 4 (four) hours as needed.    Dispense:  60 tablet    Refill:  0    Order Specific Question:   Supervising Provider    Answer:   [name], [name] [[id number]]     Paul Goguen [name] [name]  APRN, MSN, FNP-C [facility] [address]  [place], [place] [address] [phone]  

## 2020-02-26 NOTE — Telephone Encounter (Signed)
Is this okay to refill? 

## 2020-03-05 ENCOUNTER — Encounter: Payer: Self-pay | Admitting: Family Medicine

## 2020-03-05 ENCOUNTER — Ambulatory Visit: Payer: Medicaid Other | Admitting: Family Medicine

## 2020-03-05 NOTE — Patient Instructions (Incomplete)
Below is our plan:  We will ***  Please make sure you are staying well hydrated. I recommend 50-60 ounces daily. Well balanced diet and regular exercise encouraged.    Please continue follow up with care team as directed.   Follow up with *** in ***  You may receive a survey regarding today's visit. I encourage you to leave honest feed back as I do use this information to improve patient care. Thank you for seeing me today!      Migraine Headache A migraine headache is a very strong throbbing pain on one side or both sides of your head. This type of headache can also cause other symptoms. It can last from 4 hours to 3 days. Talk with your doctor about what things may bring on (trigger) this condition. What are the causes? The exact cause of this condition is not known. This condition may be triggered or caused by:  Drinking alcohol.  Smoking.  Taking medicines, such as: ? Medicine used to treat chest pain (nitroglycerin). ? Birth control pills. ? Estrogen. ? Some blood pressure medicines.  Eating or drinking certain products.  Doing physical activity. Other things that may trigger a migraine headache include:  Having a menstrual period.  Pregnancy.  Hunger.  Stress.  Not getting enough sleep or getting too much sleep.  Weather changes.  Tiredness (fatigue). What increases the risk?  Being 25-55 years old.  Being male.  Having a family history of migraine headaches.  Being Caucasian.  Having depression or anxiety.  Being very overweight. What are the signs or symptoms?  A throbbing pain. This pain may: ? Happen in any area of the head, such as on one side or both sides. ? Make it hard to do daily activities. ? Get worse with physical activity. ? Get worse around bright lights or loud noises.  Other symptoms may include: ? Feeling sick to your stomach (nauseous). ? Vomiting. ? Dizziness. ? Being sensitive to bright lights, loud noises, or  smells.  Before you get a migraine headache, you may get warning signs (an aura). An aura may include: ? Seeing flashing lights or having blind spots. ? Seeing bright spots, halos, or zigzag lines. ? Having tunnel vision or blurred vision. ? Having numbness or a tingling feeling. ? Having trouble talking. ? Having weak muscles.  Some people have symptoms after a migraine headache (postdromal phase), such as: ? Tiredness. ? Trouble thinking (concentrating). How is this treated?  Taking medicines that: ? Relieve pain. ? Relieve the feeling of being sick to your stomach. ? Prevent migraine headaches.  Treatment may also include: ? Having acupuncture. ? Avoiding foods that bring on migraine headaches. ? Learning ways to control your body functions (biofeedback). ? Therapy to help you know and deal with negative thoughts (cognitive behavioral therapy). Follow these instructions at home: Medicines  Take over-the-counter and prescription medicines only as told by your doctor.  Ask your doctor if the medicine prescribed to you: ? Requires you to avoid driving or using heavy machinery. ? Can cause trouble pooping (constipation). You may need to take these steps to prevent or treat trouble pooping:  Drink enough fluid to keep your pee (urine) pale yellow.  Take over-the-counter or prescription medicines.  Eat foods that are high in fiber. These include beans, whole grains, and fresh fruits and vegetables.  Limit foods that are high in fat and sugar. These include fried or sweet foods. Lifestyle  Do not drink alcohol.  Do   not use any products that contain nicotine or tobacco, such as cigarettes, e-cigarettes, and chewing tobacco. If you need help quitting, ask your doctor.  Get at least 8 hours of sleep every night.  Limit and deal with stress. General instructions  Keep a journal to find out what may bring on your migraine headaches. For example, write down: ? What you eat  and drink. ? How much sleep you get. ? Any change in what you eat or drink. ? Any change in your medicines.  If you have a migraine headache: ? Avoid things that make your symptoms worse, such as bright lights. ? It may help to lie down in a dark, quiet room. ? Do not drive or use heavy machinery. ? Ask your doctor what activities are safe for you.  Keep all follow-up visits as told by your doctor. This is important.      Contact a doctor if:  You get a migraine headache that is different or worse than others you have had.  You have more than 15 headache days in one month. Get help right away if:  Your migraine headache gets very bad.  Your migraine headache lasts longer than 72 hours.  You have a fever.  You have a stiff neck.  You have trouble seeing.  Your muscles feel weak or like you cannot control them.  You start to lose your balance a lot.  You start to have trouble walking.  You pass out (faint).  You have a seizure. Summary  A migraine headache is a very strong throbbing pain on one side or both sides of your head. These headaches can also cause other symptoms.  This condition may be treated with medicines and changes to your lifestyle.  Keep a journal to find out what may bring on your migraine headaches.  Contact a doctor if you get a migraine headache that is different or worse than others you have had.  Contact your doctor if you have more than 15 headache days in a month. This information is not intended to replace advice given to you by your health care provider. Make sure you discuss any questions you have with your health care provider. Document Revised: 06/02/2018 Document Reviewed: 03/23/2018 Elsevier Patient Education  2021 Elsevier Inc.  

## 2020-03-05 NOTE — Progress Notes (Deleted)
No chief complaint on file.    HISTORY OF PRESENT ILLNESS: Today 03/05/20  Paul Campos is a 30 y.o. male here today for follow up for migraines. MRI was not performed. He was started on topiramate and Nurtec.    HISTORY (copied from previous note)  09/03/2019 VP: 30 year old male with sickle cell disease, here for evaluation of headaches.  Patient reports new onset of pulsating, throbbing and pressure headache since December 2020.  Headaches are in the frontal region but radiate to the sides of his head and back of his head.  No nausea or vomiting.  Sometimes he sees spots and blurred vision.  Headaches can last hours or days at a time.  He has 3 headaches per week.  No specific triggering factors.  He has tried Fioricet and over-the-counter medicines without relief.  Patient has family history of migraine in his mother.  No visual aura, numbness or tingling sensations   REVIEW OF SYSTEMS: Out of a complete 14 system review of symptoms, the patient complains only of the following symptoms, and all other reviewed systems are negative.   ALLERGIES: Allergies  Allergen Reactions  . Dilaudid [Hydromorphone Hcl] Hives  . Hydromorphone Itching    Morphine okay.  . Cefotaxime Itching and Rash    Hives     HOME MEDICATIONS: Outpatient Medications Prior to Visit  Medication Sig Dispense Refill  . ergocalciferol (VITAMIN D2) 1.25 MG (50000 UT) capsule Take 1 capsule (50,000 Units total) by mouth once a week. 12 capsule 1  . folic acid (FOLVITE) 800 MCG tablet Take 1 tablet (800 mcg total) by mouth daily. 90 tablet 3  . hydroxyurea (HYDREA) 500 MG capsule Take 1 capsule (500 mg total) by mouth daily. May take with food to minimize GI side effects. 90 capsule 2  . ibuprofen (ADVIL) 800 MG tablet Take 1 tablet (800 mg total) by mouth every 8 (eight) hours as needed. (Patient taking differently: Take 800 mg by mouth every 8 (eight) hours as needed for moderate pain. ) 60 tablet 1  .  Oxycodone HCl 20 MG TABS Take 1 tablet (20 mg total) by mouth every 4 (four) hours as needed. 60 tablet 0  . Rimegepant Sulfate (NURTEC) 75 MG TBDP Take 75 mg by mouth daily as needed. (Patient taking differently: Take 75 mg by mouth daily as needed (headache). ) 8 tablet 6  . topiramate (TOPAMAX) 50 MG tablet Take 1 tablet (50 mg total) by mouth 2 (two) times daily. (Patient taking differently: Take 50 mg by mouth 2 (two) times daily as needed (headache). ) 60 tablet 12   No facility-administered medications prior to visit.     PAST MEDICAL HISTORY: Past Medical History:  Diagnosis Date  . Headache   . Sickle cell anemia (HCC)      PAST SURGICAL HISTORY: Past Surgical History:  Procedure Laterality Date  . APPENDECTOMY       FAMILY HISTORY: Family History  Problem Relation Age of Onset  . Lung cancer Mother   . Migraines Mother      SOCIAL HISTORY: Social History   Socioeconomic History  . Marital status: Single    Spouse name: Not on file  . Number of children: 1  . Years of education: Not on file  . Highest education level: High school graduate  Occupational History    Comment: none 09/03/19  Tobacco Use  . Smoking status: Never Smoker  . Smokeless tobacco: Never Used  Vaping Use  . Vaping  Use: Never used  Substance and Sexual Activity  . Alcohol use: No  . Drug use: No  . Sexual activity: Not on file  Other Topics Concern  . Not on file  Social History Narrative   Caffeine- maybe coffee every other morning   Social Determinants of Health   Financial Resource Strain: Not on file  Food Insecurity: Not on file  Transportation Needs: Not on file  Physical Activity: Not on file  Stress: Not on file  Social Connections: Not on file  Intimate Partner Violence: Not on file      PHYSICAL EXAM  There were no vitals filed for this visit. There is no height or weight on file to calculate BMI.   Generalized: Well developed, in no acute  distress  Cardiology: normal rate and rhythm, no murmur auscultated  Respiratory: clear to auscultation bilaterally    Neurological examination  Mentation: Alert oriented to time, place, history taking. Follows all commands speech and language fluent Cranial nerve II-XII: Pupils were equal round reactive to light. Extraocular movements were full, visual field were full on confrontational test. Facial sensation and strength were normal. Uvula tongue midline. Head turning and shoulder shrug  were normal and symmetric. Motor: The motor testing reveals 5 over 5 strength of all 4 extremities. Good symmetric motor tone is noted throughout.  Sensory: Sensory testing is intact to soft touch on all 4 extremities. No evidence of extinction is noted.  Coordination: Cerebellar testing reveals good finger-nose-finger and heel-to-shin bilaterally.  Gait and station: Gait is normal. Tandem gait is normal. Romberg is negative. No drift is seen.  Reflexes: Deep tendon reflexes are symmetric and normal bilaterally.     DIAGNOSTIC DATA (LABS, IMAGING, TESTING) - I reviewed patient records, labs, notes, testing and imaging myself where available.  Lab Results  Component Value Date   WBC 8.0 01/15/2020   HGB 12.6 (L) 01/15/2020   HCT 39.2 01/15/2020   MCV 79 01/15/2020   PLT 209 01/15/2020      Component Value Date/Time   NA 142 01/15/2020 1015   K 3.7 01/15/2020 1015   CL 104 01/15/2020 1015   CO2 23 01/15/2020 1015   GLUCOSE 74 01/15/2020 1015   GLUCOSE 91 01/04/2020 1433   BUN 4 (L) 01/15/2020 1015   CREATININE 0.84 01/15/2020 1015   CALCIUM 9.7 01/15/2020 1015   PROT 7.3 01/15/2020 1015   ALBUMIN 4.5 01/15/2020 1015   AST 14 01/15/2020 1015   ALT 11 01/15/2020 1015   ALKPHOS 69 01/15/2020 1015   BILITOT 0.5 01/15/2020 1015   GFRNONAA 120 01/15/2020 1015   GFRNONAA >60 01/04/2020 1433   GFRAA 139 01/15/2020 1015   No results found for: CHOL, HDL, LDLCALC, LDLDIRECT, TRIG, CHOLHDL No  results found for: HGBA1C No results found for: VITAMINB12 No results found for: TSH  No flowsheet data found.   ASSESSMENT AND PLAN  30 y.o. year old male  has a past medical history of Headache and Sickle cell anemia (HCC). here with   Chronic migraine without aura without status migrainosus, not intractable    No orders of the defined types were placed in this encounter.     I spent 20 minutes of face-to-face and non-face-to-face time with patient.  This included previsit chart review, lab review, study review, order entry, electronic health record documentation, patient education.    Shawnie Dapper, MSN, FNP-C 03/05/2020, 8:02 AM  [facility] [address] [place], [place] [address] [phone]

## 2020-03-11 ENCOUNTER — Other Ambulatory Visit: Payer: Self-pay

## 2020-03-11 DIAGNOSIS — G8929 Other chronic pain: Secondary | ICD-10-CM

## 2020-03-11 DIAGNOSIS — D571 Sickle-cell disease without crisis: Secondary | ICD-10-CM

## 2020-03-13 ENCOUNTER — Other Ambulatory Visit: Payer: Self-pay | Admitting: Family Medicine

## 2020-03-13 DIAGNOSIS — G8929 Other chronic pain: Secondary | ICD-10-CM

## 2020-03-13 DIAGNOSIS — D571 Sickle-cell disease without crisis: Secondary | ICD-10-CM

## 2020-03-13 MED ORDER — OXYCODONE HCL 20 MG PO TABS: 20.0000 mg | ORAL_TABLET | ORAL | 0 refills | Status: DC | PRN

## 2020-03-13 NOTE — Progress Notes (Signed)
Reviewed PDMP substance reporting system prior to prescribing opiate medications. No inconsistencies noted.   Meds ordered this encounter  Medications  . Oxycodone HCl 20 MG TABS    Sig: Take 1 tablet (20 mg total) by mouth every 4 (four) hours as needed.    Dispense:  60 tablet    Refill:  0    Order Specific Question:   Supervising Provider    Answer:   [name], [name] [[id number]]     Sho Salguero [name] Davene Jobin  APRN, MSN, FNP-C [facility] [address]  , [place] [address] [phone]  

## 2020-03-17 ENCOUNTER — Other Ambulatory Visit: Payer: Self-pay

## 2020-03-17 DIAGNOSIS — G8929 Other chronic pain: Secondary | ICD-10-CM

## 2020-03-17 DIAGNOSIS — D571 Sickle-cell disease without crisis: Secondary | ICD-10-CM

## 2020-04-07 ENCOUNTER — Other Ambulatory Visit: Payer: Self-pay

## 2020-04-07 DIAGNOSIS — D571 Sickle-cell disease without crisis: Secondary | ICD-10-CM

## 2020-04-07 DIAGNOSIS — G8929 Other chronic pain: Secondary | ICD-10-CM

## 2020-04-08 ENCOUNTER — Other Ambulatory Visit: Payer: Self-pay | Admitting: Family Medicine

## 2020-04-08 DIAGNOSIS — G8929 Other chronic pain: Secondary | ICD-10-CM

## 2020-04-08 DIAGNOSIS — D571 Sickle-cell disease without crisis: Secondary | ICD-10-CM

## 2020-04-08 MED ORDER — OXYCODONE HCL 20 MG PO TABS: 20.0000 mg | ORAL_TABLET | ORAL | 0 refills | Status: DC | PRN

## 2020-04-08 NOTE — Progress Notes (Signed)
Reviewed PDMP substance reporting system prior to prescribing opiate medications. No inconsistencies noted.   Meds ordered this encounter  Medications  . Oxycodone HCl 20 MG TABS    Sig: Take 1 tablet (20 mg total) by mouth every 4 (four) hours as needed.    Dispense:  60 tablet    Refill:  0    Order Specific Question:   Supervising Provider    Answer:   [name], [name] [[id number]]     Paul [name] Hollis  APRN, MSN, FNP-C [facility] [address]  , [place] [address] [phone]  

## 2020-04-15 ENCOUNTER — Ambulatory Visit: Payer: Medicaid Other | Admitting: Family Medicine

## 2020-04-25 ENCOUNTER — Other Ambulatory Visit: Payer: Self-pay

## 2020-04-25 DIAGNOSIS — G8929 Other chronic pain: Secondary | ICD-10-CM

## 2020-04-25 DIAGNOSIS — D571 Sickle-cell disease without crisis: Secondary | ICD-10-CM

## 2020-04-28 ENCOUNTER — Other Ambulatory Visit: Payer: Self-pay | Admitting: Family Medicine

## 2020-04-28 DIAGNOSIS — G8929 Other chronic pain: Secondary | ICD-10-CM

## 2020-04-28 DIAGNOSIS — D571 Sickle-cell disease without crisis: Secondary | ICD-10-CM

## 2020-04-28 MED ORDER — OXYCODONE HCL 20 MG PO TABS: 20.0000 mg | ORAL_TABLET | ORAL | 0 refills | Status: DC | PRN

## 2020-04-28 NOTE — Telephone Encounter (Signed)
Please see refill request.

## 2020-04-28 NOTE — Progress Notes (Signed)
Reviewed PDMP substance reporting system prior to prescribing opiate medications. No inconsistencies noted.   Meds ordered this encounter  Medications  . Oxycodone HCl 20 MG TABS    Sig: Take 1 tablet (20 mg total) by mouth every 4 (four) hours as needed.    Dispense:  60 tablet    Refill:  0    Order Specific Question:   Supervising Provider    Answer:   Quentin Angst [[id number]]     Nolon Nations  APRN, MSN, FNP-C Patient Care [facility] [address] [place], [place] [address] [phone]

## 2020-04-29 ENCOUNTER — Encounter: Payer: Self-pay | Admitting: Family Medicine

## 2020-04-29 ENCOUNTER — Ambulatory Visit (INDEPENDENT_AMBULATORY_CARE_PROVIDER_SITE_OTHER): Payer: Medicaid Other | Admitting: Family Medicine

## 2020-04-29 ENCOUNTER — Other Ambulatory Visit: Payer: Self-pay

## 2020-04-29 VITALS — BP 114/77 | HR 70 | Temp 99.0°F | Ht 72.0 in | Wt 177.0 lb

## 2020-04-29 DIAGNOSIS — E559 Vitamin D deficiency, unspecified: Secondary | ICD-10-CM

## 2020-04-29 DIAGNOSIS — D571 Sickle-cell disease without crisis: Secondary | ICD-10-CM

## 2020-04-29 DIAGNOSIS — G8929 Other chronic pain: Secondary | ICD-10-CM | POA: Diagnosis not present

## 2020-04-29 DIAGNOSIS — Z23 Encounter for immunization: Secondary | ICD-10-CM

## 2020-04-29 MED ORDER — FOLIC ACID 800 MCG PO TABS: 800.0000 ug | ORAL_TABLET | Freq: Every day | ORAL | 3 refills | Status: DC

## 2020-04-29 MED ORDER — HYDROXYUREA 500 MG PO CAPS: 500.0000 mg | ORAL_CAPSULE | Freq: Every day | ORAL | 2 refills | Status: DC

## 2020-04-29 NOTE — Progress Notes (Signed)
[facility]   Established Patient Office Visit  Subjective:  Patient ID: Paul Campos, male    DOB: 09-25-1990  Age: 30 y.o. MRN: [id number]  CC:  Chief Complaint  Patient presents with   Follow-up    3 month follow up     HPI Paul Campos is a 30 year old male with a medical history significant for sickle cell disease, chronic pain syndrome, opiate dependence and tolerance, and history of anemia of chronic disease presents for a follow up of chronic conditions. Patient says that he is doing well and does not have any new complaints on today. He has a history of chronic pain. He has been taking Oxycodone 20 mg every 6 hours as needed with maximum improvement of chronic pain. He is not having any pain at this time.  He has been taking hydroxyurea and hydrating consistently. He is requesting refills on medications today.  He currently denies any headache, dizziness, chest pain, urinary symptoms, nausea vomiting, or diarrhea.   The patient has been in otherwise good general health in the past. He does not perform monthly self testicular exams. He is a non smoker. He is sexually active with 1 partner and does not warrant STD testing on today. He uses barrier protection.   Past Medical History:  Diagnosis Date   Headache    Sickle cell anemia (HCC)     Past Surgical History:  Procedure Laterality Date   APPENDECTOMY      Family History  Problem Relation Age of Onset   Lung cancer Mother    Migraines Mother     Social History   Socioeconomic History   Marital status: Single    Spouse name: Not on file   Number of children: 1   Years of education: Not on file   Highest education level: High school graduate  Occupational History    Comment: none 09/03/19  Tobacco Use   Smoking status: Never Smoker   Smokeless tobacco: Never Used  Vaping Use   Vaping Use: Never used  Substance and Sexual Activity   Alcohol  use: No   Drug use: No   Sexual activity: Not on file  Other Topics Concern   Not on file  Social History Narrative   Caffeine- maybe coffee every other morning   Social Determinants of Health   Financial Resource Strain: Not on file  Food Insecurity: Not on file  Transportation Needs: Not on file  Physical Activity: Not on file  Stress: Not on file  Social Connections: Not on file  Intimate Partner Violence: Not on file    Outpatient Medications Prior to Visit  Medication Sig Dispense Refill   ergocalciferol (VITAMIN D2) 1.25 MG (50000 UT) capsule Take 1 capsule (50,000 Units total) by mouth once a week. 12 capsule 1   folic acid (FOLVITE) 800 MCG tablet Take 1 tablet (800 mcg total) by mouth daily. 90 tablet 3   hydroxyurea (HYDREA) 500 MG capsule Take 1 capsule (500 mg total) by mouth daily. May take with food to minimize GI side effects. 90 capsule 2   ibuprofen (ADVIL) 800 MG tablet Take 1 tablet (800 mg total) by mouth every 8 (eight) hours as needed. (Patient taking differently: Take 800 mg by mouth every 8 (eight) hours as needed for moderate pain.) 60 tablet 1   Oxycodone HCl 20 MG TABS Take 1 tablet (20 mg total) by mouth every 4 (four) hours as needed. 60 tablet  0   Rimegepant Sulfate (NURTEC) 75 MG TBDP Take 75 mg by mouth daily as needed. (Patient taking differently: Take 75 mg by mouth daily as needed (headache).) 8 tablet 6   topiramate (TOPAMAX) 50 MG tablet Take 1 tablet (50 mg total) by mouth 2 (two) times daily. (Patient taking differently: Take 50 mg by mouth 2 (two) times daily as needed (headache).) 60 tablet 12   No facility-administered medications prior to visit.    Allergies  Allergen Reactions   Dilaudid [Hydromorphone Hcl] Hives   Hydromorphone Itching    Morphine okay.   Cefotaxime Itching and Rash    Hives    ROS Review of Systems  Constitutional: Negative.   HENT: Negative.   Eyes: Negative.   Respiratory: Negative for  shortness of breath.   Cardiovascular: Negative.   Gastrointestinal: Negative.   Genitourinary: Negative.   Musculoskeletal: Positive for joint swelling.  Skin: Negative.   Psychiatric/Behavioral: Negative.       Objective:    Physical Exam Constitutional:      Appearance: Normal appearance.  Cardiovascular:     Rate and Rhythm: Normal rate and regular rhythm.     Pulses: Normal pulses.  Pulmonary:     Effort: Pulmonary effort is normal.  Abdominal:     General: Abdomen is flat. Bowel sounds are normal.  Neurological:     General: No focal deficit present.     Mental Status: He is alert. Mental status is at baseline.  Psychiatric:        Mood and Affect: Mood normal.        Behavior: Behavior normal.        Thought Content: Thought content normal.        Judgment: Judgment normal.     BP 114/77 (BP Location: Left Arm, Patient Position: Sitting, Cuff Size: Normal)    Pulse 70    Temp 99 F (37.2 C) (Temporal)    Ht 6' (1.829 m)    SpO2 99%    BMI 23.87 kg/m  Wt Readings from Last 3 Encounters:  01/15/20 176 lb (79.8 kg)  01/04/20 165 lb (74.8 kg)  10/16/19 171 lb (77.6 kg)     Health Maintenance Due  Topic Date Due   TETANUS/TDAP  Never done    There are no preventive care reminders to display for this patient.  No results found for: TSH Lab Results  Component Value Date   WBC 8.0 01/15/2020   HGB 12.6 (L) 01/15/2020   HCT 39.2 01/15/2020   MCV 79 01/15/2020   PLT 209 01/15/2020   Lab Results  Component Value Date   NA 142 01/15/2020   K 3.7 01/15/2020   CO2 23 01/15/2020   GLUCOSE 74 01/15/2020   BUN 4 (L) 01/15/2020   CREATININE 0.84 01/15/2020   BILITOT 0.5 01/15/2020   ALKPHOS 69 01/15/2020   AST 14 01/15/2020   ALT 11 01/15/2020   PROT 7.3 01/15/2020   ALBUMIN 4.5 01/15/2020   CALCIUM 9.7 01/15/2020   ANIONGAP 9 01/04/2020   No results found for: CHOL No results found for: HDL No results found for: LDLCALC No results found for:  TRIG No results found for: CHOLHDL No results found for: HMCN4B    Assessment & Plan:   Problem List Items Addressed This Visit      Other   Hb-SS disease without crisis (HCC)   Relevant Orders   Sickle Cell Panel    Other Visit Diagnoses    Other  chronic pain    -  Primary   Relevant Orders   [id number] 11+Oxyco+Alc+Crt-Bund   Vitamin D deficiency       Relevant Orders   Sickle Cell Panel     1. Other chronic pain - [id number] 11+Oxyco+Alc+Crt-Bund  2. Hb-SS disease without crisis (HCC) Sickle cell disease - Continue Hydrea. We discussed the need for good hydration, monitoring of hydration status, avoidance of heat, cold, stress, and infection triggers. We discussed the risks and benefits of Hydrea, including bone marrow suppression, the possibility of GI upset, skin ulcers, hair thinning, and teratogenicity. The patient was reminded of the need to seek medical attention of any symptoms of bleeding, anemia, or infection. Continue folic acid 1 mg daily to prevent aplastic bone marrow crises.   Pulmonary evaluation - Patient denies severe recurrent wheezes, shortness of breath with exercise, or persistent cough. If these symptoms develop, pulmonary function tests with spirometry will be ordered, and if abnormal, plan on referral to Pulmonology for further evaluation.  Cardiac - Routine screening for pulmonary hypertension is not recommended.  Eye - High risk of proliferative retinopathy. Annual eye exam with retinal exam recommended to patient.  Acute and chronic painful episodes - No medication changes warranted at this time.   - Sickle Cell Panel - hydroxyurea (HYDREA) 500 MG capsule; Take 1 capsule (500 mg total) by mouth daily. May take with food to minimize GI side effects.  Dispense: 90 capsule; Refill: 2 - folic acid (FOLVITE) 800 MCG tablet; Take 1 tablet (800 mcg total) by mouth daily.  Dispense: 90 tablet; Refill: 3  3. Vitamin D deficiency - Sickle Cell Panel  4. Need  for Tdap vaccination - Tdap vaccine greater than or equal to 7yo IM   Follow-up: Return in about 3 months (around 07/30/2020) for sickle cell anemia.    Nolon Nations  APRN, MSN, FNP-C Patient Care [facility] [address] [place], [place] [address] [phone]

## 2020-04-29 NOTE — Patient Instructions (Signed)
Sickle Cell Anemia, Adult  Sickle cell anemia is a condition where your red blood cells are shaped like sickles. Red blood cells carry oxygen through the body. Sickle-shaped cells do not live as long as normal red blood cells. They also clump together and block blood from flowing through the blood vessels. This prevents the body from getting enough oxygen. Sickle cell anemia causes organ damage and pain. It also increases the risk of infection. Follow these instructions at home: Medicines  Take over-the-counter and prescription medicines only as told by your doctor.  If you were prescribed an antibiotic medicine, take it as told by your doctor. Do not stop taking the antibiotic even if you start to feel better.  If you develop a fever, do not take medicines to lower the fever right away. Tell your doctor about the fever. Managing pain, stiffness, and swelling  Try these methods to help with pain: ? Use a heating pad. ? Take a warm bath. ? Distract yourself, such as by watching TV. Eating and drinking  Drink enough fluid to keep your pee (urine) clear or pale yellow. Drink more in hot weather and during exercise.  Limit or avoid alcohol.  Eat a healthy diet. Eat plenty of fruits, vegetables, whole grains, and lean protein.  Take vitamins and supplements as told by your doctor. Traveling  When traveling, keep these with you: ? Your medical information. ? The names of your doctors. ? Your medicines.  If you need to take an airplane, talk to your doctor first. Activity  Rest often.  Avoid exercises that make your heart beat much faster, such as jogging. General instructions  Do not use products that have nicotine or tobacco, such as cigarettes and e-cigarettes. If you need help quitting, ask your doctor.  Consider wearing a medical alert bracelet.  Avoid being in high places (high altitudes), such as mountains.  Avoid very hot or cold temperatures.  Avoid places where the  temperature changes a lot.  Keep all follow-up visits as told by your doctor. This is important. Contact a doctor if:  A joint hurts.  Your feet or hands hurt or swell.  You feel tired (fatigued). Get help right away if:  You have symptoms of infection. These include: ? Fever. ? Chills. ? Being very tired. ? Irritability. ? Poor eating. ? Throwing up (vomiting).  You feel dizzy or faint.  You have new stomach pain, especially on the left side.  You have a an erection (priapism) that lasts more than 4 hours.  You have numbness in your arms or legs.  You have a hard time moving your arms or legs.  You have trouble talking.  You have pain that does not go away when you take medicine.  You are short of breath.  You are breathing fast.  You have a long-term cough.  You have pain in your chest.  You have a bad headache.  You have a stiff neck.  Your stomach looks bloated even though you did not eat much.  Your skin is pale.  You suddenly cannot see well. Summary  Sickle cell anemia is a condition where your red blood cells are shaped like sickles.  Follow your doctor's advice on ways to manage pain, food to eat, activities to do, and steps to take for safe travel.  Get medical help right away if you have any signs of infection, such as a fever. This information is not intended to replace advice given to you by   your health care provider. Make sure you discuss any questions you have with your health care provider. Document Revised: 07/05/2019 Document Reviewed: 07/05/2019 Elsevier Patient Education  2021 [employer]  

## 2020-04-30 LAB — CMP14+CBC/D/PLT+FER+RETIC+V...
ALT: 11 IU/L (ref 0–44)
AST: 20 IU/L (ref 0–40)
Albumin/Globulin Ratio: 1.7 (ref 1.2–2.2)
Albumin: 4.6 g/dL (ref 4.1–5.2)
Alkaline Phosphatase: 71 IU/L (ref 44–121)
BUN/Creatinine Ratio: 5 — ABNORMAL LOW (ref 9–20)
BUN: 4 mg/dL — ABNORMAL LOW (ref 6–20)
Basophils Absolute: 0.1 10*3/uL (ref 0.0–0.2)
Basos: 1 %
Bilirubin Total: 0.9 mg/dL (ref 0.0–1.2)
CO2: 24 mmol/L (ref 20–29)
Calcium: 9.2 mg/dL (ref 8.7–10.2)
Chloride: 102 mmol/L (ref 96–106)
Creatinine, Ser: 0.8 mg/dL (ref 0.76–1.27)
EOS (ABSOLUTE): 0.5 10*3/uL — ABNORMAL HIGH (ref 0.0–0.4)
Eos: 5 %
Ferritin: 191 ng/mL (ref 30–400)
Globulin, Total: 2.7 g/dL (ref 1.5–4.5)
Glucose: 78 mg/dL (ref 65–99)
Hematocrit: 41.8 % (ref 37.5–51.0)
Hemoglobin: 13.4 g/dL (ref 13.0–17.7)
Immature Grans (Abs): 0 10*3/uL (ref 0.0–0.1)
Immature Granulocytes: 0 %
Lymphocytes Absolute: 2.3 10*3/uL (ref 0.7–3.1)
Lymphs: 26 %
MCH: 24.9 pg — ABNORMAL LOW (ref 26.6–33.0)
MCHC: 32.1 g/dL (ref 31.5–35.7)
MCV: 78 fL — ABNORMAL LOW (ref 79–97)
Monocytes Absolute: 1.1 10*3/uL — ABNORMAL HIGH (ref 0.1–0.9)
Monocytes: 13 %
Neutrophils Absolute: 4.8 10*3/uL (ref 1.4–7.0)
Neutrophils: 55 %
Platelets: 181 10*3/uL (ref 150–450)
Potassium: 4.7 mmol/L (ref 3.5–5.2)
RBC: 5.38 x10E6/uL (ref 4.14–5.80)
RDW: 18.2 % — ABNORMAL HIGH (ref 11.6–15.4)
Retic Ct Pct: 4.3 % — ABNORMAL HIGH (ref 0.6–2.6)
Sodium: 140 mmol/L (ref 134–144)
Total Protein: 7.3 g/dL (ref 6.0–8.5)
Vit D, 25-Hydroxy: 5.5 ng/mL — ABNORMAL LOW (ref 30.0–100.0)
WBC: 8.8 10*3/uL (ref 3.4–10.8)
eGFR: 124 mL/min/{1.73_m2} (ref >59)

## 2020-05-05 ENCOUNTER — Encounter (HOSPITAL_BASED_OUTPATIENT_CLINIC_OR_DEPARTMENT_OTHER): Payer: Self-pay | Admitting: *Deleted

## 2020-05-05 ENCOUNTER — Emergency Department (HOSPITAL_BASED_OUTPATIENT_CLINIC_OR_DEPARTMENT_OTHER): Payer: Medicaid Other

## 2020-05-05 ENCOUNTER — Inpatient Hospital Stay (HOSPITAL_BASED_OUTPATIENT_CLINIC_OR_DEPARTMENT_OTHER)
Admission: EM | Admit: 2020-05-05 | Discharge: 2020-05-09 | DRG: 812 | Disposition: A | Discharge status: Home / Self Care | Payer: Medicaid Other | Attending: Internal Medicine | Admitting: Internal Medicine

## 2020-05-05 ENCOUNTER — Other Ambulatory Visit: Payer: Self-pay

## 2020-05-05 DIAGNOSIS — D72829 Elevated white blood cell count, unspecified: Secondary | ICD-10-CM

## 2020-05-05 DIAGNOSIS — Z79899 Other long term (current) drug therapy: Secondary | ICD-10-CM

## 2020-05-05 DIAGNOSIS — Z885 Allergy status to narcotic agent status: Secondary | ICD-10-CM

## 2020-05-05 DIAGNOSIS — Z888 Allergy status to other drugs, medicaments and biological substances status: Secondary | ICD-10-CM

## 2020-05-05 DIAGNOSIS — G894 Chronic pain syndrome: Secondary | ICD-10-CM

## 2020-05-05 DIAGNOSIS — Z20822 Contact with and (suspected) exposure to covid-19: Secondary | ICD-10-CM | POA: Diagnosis present

## 2020-05-05 DIAGNOSIS — D5700 Hb-SS disease with crisis, unspecified: Principal | ICD-10-CM | POA: Diagnosis present

## 2020-05-05 DIAGNOSIS — F1120 Opioid dependence, uncomplicated: Secondary | ICD-10-CM | POA: Diagnosis present

## 2020-05-05 LAB — COMPREHENSIVE METABOLIC PANEL
ALT: 20 U/L (ref 0–44)
AST: 39 U/L (ref 15–41)
Albumin: 4.3 g/dL (ref 3.5–5.0)
Alkaline Phosphatase: 60 U/L (ref 38–126)
Anion gap: 11 (ref 5–15)
BUN: 10 mg/dL (ref 6–20)
CO2: 25 mmol/L (ref 22–32)
Calcium: 9 mg/dL (ref 8.9–10.3)
Chloride: 101 mmol/L (ref 98–111)
Creatinine, Ser: 0.91 mg/dL (ref 0.61–1.24)
GFR, Estimated: >60 mL/min (ref >60)
Glucose, Bld: 105 mg/dL — ABNORMAL HIGH (ref 70–99)
Potassium: 4.2 mmol/L (ref 3.5–5.1)
Sodium: 137 mmol/L (ref 135–145)
Total Bilirubin: 1.6 mg/dL — ABNORMAL HIGH (ref 0.3–1.2)
Total Protein: 8.1 g/dL (ref 6.5–8.1)

## 2020-05-05 LAB — CBC WITH DIFFERENTIAL/PLATELET
Abs Immature Granulocytes: 0.05 10*3/uL (ref 0.00–0.07)
Basophils Absolute: 0.1 10*3/uL (ref 0.0–0.1)
Basophils Relative: 0 %
Eosinophils Absolute: 0.5 10*3/uL (ref 0.0–0.5)
Eosinophils Relative: 4 %
HCT: 34.5 % — ABNORMAL LOW (ref 39.0–52.0)
Hemoglobin: 12.1 g/dL — ABNORMAL LOW (ref 13.0–17.0)
Immature Granulocytes: 0 %
Lymphocytes Relative: 26 %
Lymphs Abs: 3 10*3/uL (ref 0.7–4.0)
MCH: 25.3 pg — ABNORMAL LOW (ref 26.0–34.0)
MCHC: 35.1 g/dL (ref 30.0–36.0)
MCV: 72 fL — ABNORMAL LOW (ref 80.0–100.0)
Monocytes Absolute: 2.7 10*3/uL — ABNORMAL HIGH (ref 0.1–1.0)
Monocytes Relative: 23 %
Neutro Abs: 5.4 10*3/uL (ref 1.7–7.7)
Neutrophils Relative %: 47 %
Platelets: 164 10*3/uL (ref 150–400)
RBC: 4.79 MIL/uL (ref 4.22–5.81)
RDW: 16 % — ABNORMAL HIGH (ref 11.5–15.5)
WBC Morphology: ABNORMAL
WBC: 11.6 10*3/uL — ABNORMAL HIGH (ref 4.0–10.5)
nRBC: 0.3 % — ABNORMAL HIGH (ref 0.0–0.2)

## 2020-05-05 MED ORDER — ONDANSETRON 4 MG PO TBDP
4.0000 mg | ORAL_TABLET | Freq: Once | ORAL | Status: AC
  Administered 2020-05-05: 4 mg via ORAL
  Filled 2020-05-05: qty 1

## 2020-05-05 MED ORDER — MORPHINE SULFATE (PF) 4 MG/ML IV SOLN
8.0000 mg | Freq: Once | INTRAVENOUS | Status: AC | PRN
  Administered 2020-05-06: 8 mg via INTRAVENOUS
  Filled 2020-05-05: qty 2

## 2020-05-05 MED ORDER — MORPHINE SULFATE (PF) 4 MG/ML IV SOLN
8.0000 mg | Freq: Once | INTRAVENOUS | Status: AC
  Administered 2020-05-05: 8 mg via INTRAVENOUS
  Filled 2020-05-05: qty 2

## 2020-05-05 NOTE — ED Triage Notes (Signed)
Arrived via EMS, states having back pain, left leg and rt arm, states he is normally able to control his pain but despite usual pain med having no relief. Onset was 2-3 days ago

## 2020-05-05 NOTE — ED Notes (Signed)
1 liter NS infusing via infusion pump

## 2020-05-05 NOTE — ED Provider Notes (Signed)
[facility] Provider Note: Lowella Dell, MD, FACEP  CSN: [id number] MRN: [id number] ARRIVAL: 05/05/20 at 2140 ROOM: [facility]   CHIEF COMPLAINT  Sickle Cell Pain Crisis   HISTORY OF PRESENT ILLNESS  05/05/20 10:51 PM Paul Campos is a 30 y.o. male with sickle cell disease.  He is here with 3 days of sickle cell pain.  The pain began in his back and is now spread to his arms and legs.  The pain is typical of his sickle cell crises although a little worse than usual.  He has not been able to get the pain under control with his home medications.  He rates his pain is a 10 out of 10.  He denies any fever, chest pain, shortness of breath, nausea, vomiting or diarrhea.  He was given morphine 8 mg IV prior to my evaluation with partial relief of symptoms   Past Medical History:  Diagnosis Date  . Headache   . Sickle cell anemia (HCC)     Past Surgical History:  Procedure Laterality Date  . APPENDECTOMY      Family History  Problem Relation Age of Onset  . Lung cancer Mother   . Migraines Mother     Social History   Tobacco Use  . Smoking status: Never Smoker  . Smokeless tobacco: Never Used  Vaping Use  . Vaping Use: Never used  Substance Use Topics  . Alcohol use: No  . Drug use: No    Prior to Admission medications   Medication Sig Start Date End Date Taking? Authorizing Provider  ergocalciferol (VITAMIN D2) 1.25 MG (50000 UT) capsule Take 1 capsule (50,000 Units total) by mouth once a week. 01/19/20   Massie Maroon, FNP  folic acid (FOLVITE) 800 MCG tablet Take 1 tablet (800 mcg total) by mouth daily. 04/29/20   Massie Maroon, FNP  hydroxyurea (HYDREA) 500 MG capsule Take 1 capsule (500 mg total) by mouth daily. May take with food to minimize GI side effects. 04/29/20   Massie Maroon, FNP  ibuprofen (ADVIL) 800 MG tablet Take 1 tablet (800 mg total) by mouth every 8 (eight) hours as needed. Patient taking differently: Take 800 mg by mouth every 8  (eight) hours as needed for moderate pain. 02/06/19   Massie Maroon, FNP  Oxycodone HCl 20 MG TABS Take 1 tablet (20 mg total) by mouth every 4 (four) hours as needed. 04/28/20   Massie Maroon, FNP  Rimegepant Sulfate (NURTEC) 75 MG TBDP Take 75 mg by mouth daily as needed. Patient taking differently: Take 75 mg by mouth daily as needed (headache). 09/03/19   Penumalli, Glenford Bayley, MD  topiramate (TOPAMAX) 50 MG tablet Take 1 tablet (50 mg total) by mouth 2 (two) times daily. Patient taking differently: Take 50 mg by mouth 2 (two) times daily as needed (headache). 09/03/19   Penumalli, Glenford Bayley, MD    Allergies Dilaudid [hydromorphone hcl], Hydromorphone, and Cefotaxime   REVIEW OF SYSTEMS  Negative except as noted here or in the History of Present Illness.   PHYSICAL EXAMINATION  Initial Vital Signs Blood pressure 127/84, pulse 78, temperature 99.3 F (37.4 C), temperature source Oral, resp. rate (!) 22, height 6' (1.829 m), weight 80.3 kg, SpO2 99 %.  Examination General: Well-developed, well-nourished male in no acute distress; appearance consistent with age of record HENT: normocephalic; atraumatic Eyes: pupils equal, round and reactive to light; extraocular muscles intact Neck: supple Heart: regular rate and rhythm Lungs: clear  to auscultation bilaterally Abdomen: soft; nondistended; nontender; bowel sounds present Extremities: No deformity; full range of motion; pulses normal Neurologic: Awake, alert and oriented; motor function intact in all extremities and symmetric; no facial droop Skin: Warm and dry Psychiatric: Normal mood and affect   RESULTS  Summary of this visit's results, reviewed and interpreted by myself:   EKG Interpretation  Date/Time:    Ventricular Rate:    PR Interval:    QRS Duration:   QT Interval:    QTC Calculation:   R Axis:     Text Interpretation:        Laboratory Studies: Results for orders placed or performed during the hospital  encounter of 05/05/20 (from the past 24 hour(s))  Comprehensive metabolic panel     Status: Abnormal   Collection Time: 05/05/20  9:57 PM  Result Value Ref Range   Sodium 137 135 - 145 mmol/L   Potassium 4.2 3.5 - 5.1 mmol/L   Chloride 101 98 - 111 mmol/L   CO2 25 22 - 32 mmol/L   Glucose, Bld 105 (H) 70 - 99 mg/dL   BUN 10 6 - 20 mg/dL   Creatinine, Ser 8.18 0.61 - 1.24 mg/dL   Calcium 9.0 8.9 - 56.3 mg/dL   Total Protein 8.1 6.5 - 8.1 g/dL   Albumin 4.3 3.5 - 5.0 g/dL   AST 39 15 - 41 U/L   ALT 20 0 - 44 U/L   Alkaline Phosphatase 60 38 - 126 U/L   Total Bilirubin 1.6 (H) 0.3 - 1.2 mg/dL   GFR, Estimated >14 >97 mL/min   Anion gap 11 5 - 15  CBC with Differential     Status: Abnormal   Collection Time: 05/05/20  9:57 PM  Result Value Ref Range   WBC 11.6 (H) 4.0 - 10.5 K/uL   RBC 4.79 4.22 - 5.81 MIL/uL   Hemoglobin 12.1 (L) 13.0 - 17.0 g/dL   HCT 02.6 (L) 37.8 - 58.8 %   MCV 72.0 (L) 80.0 - 100.0 fL   MCH 25.3 (L) 26.0 - 34.0 pg   MCHC 35.1 30.0 - 36.0 g/dL   RDW 50.2 (H) 77.4 - 12.8 %   Platelets 164 150 - 400 K/uL   nRBC 0.3 (H) 0.0 - 0.2 %   Neutrophils Relative % 47 %   Neutro Abs 5.4 1.7 - 7.7 K/uL   Lymphocytes Relative 26 %   Lymphs Abs 3.0 0.7 - 4.0 K/uL   Monocytes Relative 23 %   Monocytes Absolute 2.7 (H) 0.1 - 1.0 K/uL   Eosinophils Relative 4 %   Eosinophils Absolute 0.5 0.0 - 0.5 K/uL   Basophils Relative 0 %   Basophils Absolute 0.1 0.0 - 0.1 K/uL   WBC Morphology Abnormal lymphocytes present    Smear Review PLATELET COUNT CONFIRMED BY SMEAR    Immature Granulocytes 0 %   Abs Immature Granulocytes 0.05 0.00 - 0.07 K/uL   Sickle Cells PRESENT    Target Cells PRESENT   Reticulocytes     Status: Abnormal   Collection Time: 05/05/20  9:57 PM  Result Value Ref Range   Retic Ct Pct 4.9 (H) 0.4 - 3.1 %   RBC. 4.81 4.22 - 5.81 MIL/uL   Retic Count, Absolute 237.1 (H) 19.0 - 186.0 K/uL   Immature Retic Fract 27.0 (H) 2.3 - 15.9 %   Imaging  Studies: DG Chest 2 View  Result Date: 05/05/2020 CLINICAL DATA:  Low-grade fever EXAM: CHEST - 2 VIEW  COMPARISON:  05/27/2016 FINDINGS: No focal opacity or pleural effusion. Stable cardiomediastinal silhouette. No pneumothorax. IMPRESSION: No active cardiopulmonary disease. Electronically Signed   By: Jasmine Pang M.D.   On: 05/05/2020 22:45    ED COURSE and MDM  Nursing notes, initial and subsequent vitals signs, including pulse oximetry, reviewed and interpreted by myself.  Vitals:   05/06/20 0030 05/06/20 0036 05/06/20 0130 05/06/20 0215  BP: 126/78 98/66    Pulse: 70 66 64 77  Resp: 20 16    Temp: 99.1 F (37.3 C)     TempSrc: Oral     SpO2: 99% 100% 96% 97%  Weight:      Height:       Medications  oxyCODONE (Oxy IR/ROXICODONE) immediate release tablet 15 mg (has no administration in time range)  ondansetron (ZOFRAN-ODT) disintegrating tablet 4 mg (4 mg Oral Given 05/05/20 2209)  morphine 4 MG/ML injection 8 mg (8 mg Intravenous Given 05/05/20 2209)  morphine 4 MG/ML injection 8 mg (8 mg Intravenous Given 05/06/20 0007)   2:23 AM Patient states his pain is not controlled and he would like to be admitted.  3:13 AM Dr. Leafy Half to admit   PROCEDURES  Procedures   ED DIAGNOSES     ICD-10-CM   1. Sickle cell pain crisis (HCC)  D57.00        Alisse Tuite, Jonny Ruiz, MD 05/06/20 [phone]

## 2020-05-06 DIAGNOSIS — D5700 Hb-SS disease with crisis, unspecified: Principal | ICD-10-CM | POA: Diagnosis present

## 2020-05-06 DIAGNOSIS — F1120 Opioid dependence, uncomplicated: Secondary | ICD-10-CM | POA: Diagnosis present

## 2020-05-06 DIAGNOSIS — G894 Chronic pain syndrome: Secondary | ICD-10-CM

## 2020-05-06 DIAGNOSIS — Z888 Allergy status to other drugs, medicaments and biological substances status: Secondary | ICD-10-CM | POA: Diagnosis not present

## 2020-05-06 DIAGNOSIS — Z79899 Other long term (current) drug therapy: Secondary | ICD-10-CM | POA: Diagnosis not present

## 2020-05-06 DIAGNOSIS — Z885 Allergy status to narcotic agent status: Secondary | ICD-10-CM | POA: Diagnosis not present

## 2020-05-06 DIAGNOSIS — D72829 Elevated white blood cell count, unspecified: Secondary | ICD-10-CM | POA: Diagnosis present

## 2020-05-06 DIAGNOSIS — Z20822 Contact with and (suspected) exposure to covid-19: Secondary | ICD-10-CM | POA: Diagnosis present

## 2020-05-06 LAB — RETICULOCYTES
Immature Retic Fract: 27 % — ABNORMAL HIGH (ref 2.3–15.9)
RBC.: 4.81 MIL/uL (ref 4.22–5.81)
Retic Count, Absolute: 237.1 10*3/uL — ABNORMAL HIGH (ref 19.0–186.0)
Retic Ct Pct: 4.9 % — ABNORMAL HIGH (ref 0.4–3.1)

## 2020-05-06 LAB — RESP PANEL BY RT-PCR (FLU A&B, COVID) ARPGX2
Influenza A by PCR: NEGATIVE
Influenza B by PCR: NEGATIVE
SARS Coronavirus 2 by RT PCR: NEGATIVE

## 2020-05-06 MED ORDER — ENOXAPARIN SODIUM 40 MG/0.4ML ~~LOC~~ SOLN
40.0000 mg | SUBCUTANEOUS | Status: DC
  Administered 2020-05-06: 40 mg via SUBCUTANEOUS
  Filled 2020-05-06 (×2): qty 0.4

## 2020-05-06 MED ORDER — SODIUM CHLORIDE 0.9% FLUSH: 9.0000 mL | INTRAVENOUS | Status: DC | PRN

## 2020-05-06 MED ORDER — SENNOSIDES-DOCUSATE SODIUM 8.6-50 MG PO TABS
1.0000 | ORAL_TABLET | Freq: Two times a day (BID) | ORAL | Status: DC
  Administered 2020-05-06 – 2020-05-09 (×6): 1 via ORAL
  Filled 2020-05-06 (×6): qty 1

## 2020-05-06 MED ORDER — HYDROMORPHONE 1 MG/ML IV SOLN: INTRAVENOUS | Status: DC

## 2020-05-06 MED ORDER — HYDROXYUREA 500 MG PO CAPS
500.0000 mg | ORAL_CAPSULE | Freq: Every day | ORAL | Status: DC
  Administered 2020-05-07 – 2020-05-09 (×3): 500 mg via ORAL
  Filled 2020-05-06 (×3): qty 1

## 2020-05-06 MED ORDER — FOLIC ACID 1 MG PO TABS
1.0000 mg | ORAL_TABLET | Freq: Every day | ORAL | Status: DC
  Administered 2020-05-07 – 2020-05-09 (×3): 1 mg via ORAL
  Filled 2020-05-06 (×3): qty 1

## 2020-05-06 MED ORDER — MORPHINE SULFATE (PF) 4 MG/ML IV SOLN
8.0000 mg | Freq: Once | INTRAVENOUS | Status: AC | PRN
  Administered 2020-05-06: 8 mg via INTRAVENOUS
  Filled 2020-05-06: qty 2

## 2020-05-06 MED ORDER — MORPHINE SULFATE 1 MG/ML IV SOLN PCA
INTRAVENOUS | Status: DC
Start: 2020-05-06 — End: 2020-05-07
  Administered 2020-05-06: 6 mg via INTRAVENOUS
  Administered 2020-05-07: 11.78 mg via INTRAVENOUS
  Administered 2020-05-07: 30 mL via INTRAVENOUS
  Administered 2020-05-07: 10 mg via INTRAVENOUS
  Administered 2020-05-07: 8 mg via INTRAVENOUS
  Filled 2020-05-06 (×3): qty 30

## 2020-05-06 MED ORDER — KETOROLAC TROMETHAMINE 15 MG/ML IJ SOLN
15.0000 mg | Freq: Four times a day (QID) | INTRAMUSCULAR | Status: DC
  Administered 2020-05-06 – 2020-05-09 (×12): 15 mg via INTRAVENOUS
  Filled 2020-05-06 (×12): qty 1

## 2020-05-06 MED ORDER — DIPHENHYDRAMINE HCL 50 MG/ML IJ SOLN
25.0000 mg | Freq: Once | INTRAMUSCULAR | Status: AC
  Administered 2020-05-06: 25 mg via INTRAVENOUS
  Filled 2020-05-06: qty 1

## 2020-05-06 MED ORDER — DIPHENHYDRAMINE HCL 25 MG PO CAPS
25.0000 mg | ORAL_CAPSULE | ORAL | Status: DC | PRN
  Administered 2020-05-07 – 2020-05-09 (×2): 25 mg via ORAL
  Filled 2020-05-06 (×2): qty 1

## 2020-05-06 MED ORDER — DIPHENHYDRAMINE HCL 25 MG PO CAPS: 25.0000 mg | ORAL_CAPSULE | ORAL | Status: DC | PRN

## 2020-05-06 MED ORDER — NALOXONE HCL 0.4 MG/ML IJ SOLN: 0.4000 mg | INTRAMUSCULAR | Status: DC | PRN

## 2020-05-06 MED ORDER — ONDANSETRON HCL 4 MG/2ML IJ SOLN: 4.0000 mg | Freq: Four times a day (QID) | INTRAMUSCULAR | Status: DC | PRN

## 2020-05-06 MED ORDER — SODIUM CHLORIDE 0.9 % IV SOLN
25.0000 mg | INTRAVENOUS | Status: DC | PRN
  Filled 2020-05-06: qty 0.5

## 2020-05-06 MED ORDER — SODIUM CHLORIDE 0.45 % IV SOLN: INTRAVENOUS | Status: DC

## 2020-05-06 MED ORDER — OXYCODONE HCL 5 MG PO TABS
15.0000 mg | ORAL_TABLET | Freq: Once | ORAL | Status: AC
  Administered 2020-05-06: 15 mg via ORAL
  Filled 2020-05-06: qty 3

## 2020-05-06 MED ORDER — MORPHINE SULFATE (PF) 4 MG/ML IV SOLN
8.0000 mg | Freq: Once | INTRAVENOUS | Status: AC | PRN
Start: 2020-05-06 — End: 2020-05-06
  Administered 2020-05-06: 8 mg via INTRAVENOUS
  Filled 2020-05-06: qty 2

## 2020-05-06 MED ORDER — POLYETHYLENE GLYCOL 3350 17 G PO PACK: 17.0000 g | PACK | Freq: Every day | ORAL | Status: DC | PRN

## 2020-05-06 NOTE — ED Notes (Signed)
Pt provided oj and graham crackers with peanut butter per request.

## 2020-05-06 NOTE — H&P (Addendum)
H&P  Patient Demographics:  Paul Campos, is a 30 y.o. male  MRN: [id number]   DOB - 1990/06/26  Admit Date - 05/05/2020  Outpatient Primary MD for the patient is Massie Maroon, FNP  Chief Complaint  Patient presents with  . Sickle Cell Pain Crisis      HPI:   Paul Campos  is a 30 y.o. male with a medical history significant for sickle cell disease, chronic pain syndrome, opiate dependence, and tolerance presented to ER with complaints of low back and lower extremity pain. Patient says that pain intensity increased 2 days ago and has been unrelieved by home medications.  He attributes current pain crisis to changes in weather.  Pain is typically relieved by oxycodone, however medication has been mostly ineffective.  He currently rates pain as 9/10 characterized as constant and throbbing.  Pain is primarily to upper and lower extremities and low back.  He denies any subjective fever, chills, chest pain, shortness of breath, or persistent cough.  No urinary symptoms, nausea, vomiting, or diarrhea.  No sick contacts, recent travel, or exposure to COVID-19.    ER Course:  Vital signs stable.  BP 101/62, pulse 57, temperature 98.3 F, respirations 16.  Oxygen saturation is 97% on RA. Complete blood count shows hemoglobin 12.1, WBCs 11.6, and platelets 164,000. Comprehensive metabolic panel shows total bilirubin of 1.6, otherwise within normal limits.  COVID-19 test negative.  Patient's pain persists despite IV fluids and IV Dilaudid.  Sickle cell team contacted for admission and patient admitted to MedSurg for further management of sickle cell pain crisis.     Review of systems:   Review of Systems  Constitutional: Negative.   HENT: Negative.   Eyes: Negative.   Respiratory: Negative.   Cardiovascular: Negative.   Gastrointestinal: Negative.   Genitourinary: Negative.   Musculoskeletal: Positive for back pain and joint pain.  Skin: Negative.   Neurological: Negative.    Psychiatric/Behavioral: Negative.     With Past History of the following :   Past Medical History:  Diagnosis Date  . Headache   . Sickle cell anemia (HCC)       Past Surgical History:  Procedure Laterality Date  . APPENDECTOMY       Social History:   Social History   Tobacco Use  . Smoking status: Never Smoker  . Smokeless tobacco: Never Used  Substance Use Topics  . Alcohol use: No     Lives - At home   Family History :   Family History  Problem Relation Age of Onset  . Lung cancer Mother   . Migraines Mother      Home Medications:   Prior to Admission medications   Medication Sig Start Date End Date Taking? Authorizing Provider  folic acid (FOLVITE) 800 MCG tablet Take 1 tablet (800 mcg total) by mouth daily. 04/29/20  Yes Massie Maroon, FNP  hydroxyurea (HYDREA) 500 MG capsule Take 1 capsule (500 mg total) by mouth daily. May take with food to minimize GI side effects. 04/29/20  Yes Massie Maroon, FNP  ibuprofen (ADVIL) 800 MG tablet Take 1 tablet (800 mg total) by mouth every 8 (eight) hours as needed. Patient taking differently: Take 800 mg by mouth every 8 (eight) hours as needed for moderate pain. 02/06/19  Yes Massie Maroon, FNP  Oxycodone HCl 20 MG TABS Take 1 tablet (20 mg total) by mouth every 4 (four) hours as needed. Patient taking differently: Take 20 mg by mouth every  4 (four) hours as needed (pain). 04/28/20  Yes Massie Maroon, FNP  ergocalciferol (VITAMIN D2) 1.25 MG (50000 UT) capsule Take 1 capsule (50,000 Units total) by mouth once a week. Patient not taking: Reported on 05/06/2020 01/19/20   Massie Maroon, FNP     Allergies:   Allergies  Allergen Reactions  . Dilaudid [Hydromorphone Hcl] Hives  . Hydromorphone Itching    Morphine okay.  . Cefotaxime Itching and Rash    Hives     Physical Exam:   Vitals:   Vitals:   05/06/20 1616 05/06/20 1706  BP:  101/62  Pulse:  (!) 57  Resp: 16 16  Temp:  98.3 F (36.8 C)   SpO2: 100% 99%    Physical Exam: Constitutional: Patient appears well-developed and well-nourished. Not in obvious distress. HENT: Normocephalic, atraumatic, External right and left ear normal. Oropharynx is clear and moist.  Eyes: Conjunctivae and EOM are normal. PERRLA, no scleral icterus. Neck: Normal ROM. Neck supple. No JVD. No tracheal deviation. No thyromegaly. CVS: RRR, S1/S2 +, no murmurs, no gallops, no carotid bruit.  Pulmonary: Effort and breath sounds normal, no stridor, rhonchi, wheezes, rales.  Abdominal: Soft. BS +, no distension, tenderness, rebound or guarding.  Musculoskeletal: Normal range of motion. No edema and no tenderness.  Lymphadenopathy: No lymphadenopathy noted, cervical, inguinal or axillary Neuro: Alert. Normal reflexes, muscle tone coordination. No cranial nerve deficit. Skin: Skin is warm and dry. No rash noted. Not diaphoretic. No erythema. No pallor. Psychiatric: Normal mood and affect. Behavior, judgment, thought content normal.   Data Review:   CBC Recent Labs  Lab 05/05/20 2157  WBC 11.6*  HGB 12.1*  HCT 34.5*  PLT 164  MCV 72.0*  MCH 25.3*  MCHC 35.1  RDW 16.0*  LYMPHSABS 3.0  MONOABS 2.7*  EOSABS 0.5  BASOSABS 0.1   ------------------------------------------------------------------------------------------------------------------  Chemistries  Recent Labs  Lab 05/05/20 2157  NA 137  K 4.2  CL 101  CO2 25  GLUCOSE 105*  BUN 10  CREATININE 0.91  CALCIUM 9.0  AST 39  ALT 20  ALKPHOS 60  BILITOT 1.6*   ------------------------------------------------------------------------------------------------------------------ estimated creatinine clearance is 132.6 mL/min (by C-G formula based on SCr of 0.91 mg/dL). ------------------------------------------------------------------------------------------------------------------ No results for input(s): TSH, T4TOTAL, T3FREE, THYROIDAB in the last 72 hours.  Invalid input(s):  FREET3  Coagulation profile No results for input(s): INR, PROTIME in the last 168 hours. ------------------------------------------------------------------------------------------------------------------- No results for input(s): DDIMER in the last 72 hours. -------------------------------------------------------------------------------------------------------------------  Cardiac Enzymes No results for input(s): CKMB, TROPONINI, MYOGLOBIN in the last 168 hours.  Invalid input(s): CK ------------------------------------------------------------------------------------------------------------------ No results found for: BNP  ---------------------------------------------------------------------------------------------------------------  Urinalysis    Component Value Date/Time   COLORURINE AMBER (A) 08/19/2012 1024   APPEARANCEUR Clear 12/02/2017 0952   LABSPEC 1.020 08/19/2012 1024   PHURINE 6.5 08/19/2012 1024   GLUCOSEU Negative 12/02/2017 0952   HGBUR NEGATIVE 08/19/2012 1024   BILIRUBINUR neg 01/15/2020 0923   BILIRUBINUR Negative 12/02/2017 0952   KETONESUR negative 04/26/2018 0941   KETONESUR NEGATIVE 08/19/2012 1024   PROTEINUR Negative 01/15/2020 0923   PROTEINUR Negative 12/02/2017 0952   PROTEINUR NEGATIVE 08/19/2012 1024   UROBILINOGEN 0.2 01/15/2020 0923   UROBILINOGEN 0.2 08/19/2012 1024   NITRITE neg 01/15/2020 0923   NITRITE Negative 12/02/2017 0952   NITRITE NEGATIVE 08/19/2012 1024   LEUKOCYTESUR Negative 01/15/2020 0923   LEUKOCYTESUR 2+ (A) 12/02/2017 0952    ----------------------------------------------------------------------------------------------------------------   Imaging Results:    DG Chest 2 View  Result Date: 05/05/2020 CLINICAL DATA:  Low-grade fever EXAM: CHEST - 2 VIEW COMPARISON:  05/27/2016 FINDINGS: No focal opacity or pleural effusion. Stable cardiomediastinal silhouette. No pneumothorax. IMPRESSION: No active cardiopulmonary disease.  Electronically Signed   By: Jasmine Pang M.D.   On: 05/05/2020 22:45     Assessment & Plan:  Active Problems:   Sickle cell pain crisis (HCC)   Acute sickle cell crisis (HCC)  Sickle cell disease with pain crisis: Continue IV fluids, 0.45% saline at 125 mL/h Initiate IV Morphine PCA with settings of 2 mg, 10-minute lockout, and 14 mg/h. Toradol 15 mg IV every 6 hours for total of 5 days Hold home oxycodone, will transition to oral medication as pain intensity improves. Monitor vital signs very closely, reevaluate pain scale regularly, and supplemental oxygen as needed. Patient will be reevaluated for pain in the context of function and relationship to baseline as his care progresses.  Chronic pain syndrome: We will transition to home medications as pain intensity improves  Sickle cell anemia: Hemoglobin 12.1, consistent with patient's baseline.  There is no clinical indication for blood transfusion at this time.  Continue folic acid and hydroxyurea.  Repeat CBC in a.m.  Leukocytosis: Mild leukocytosis, more than likely reactive.  Continue to follow closely without antibiotics.  CBC in AM.  DVT Prophylaxis: Subcut Lovenox and SCDs  AM Labs Ordered, also please review Full Orders  Family Communication: Admission, patient's condition and plan of care including tests being ordered have been discussed with the patient who indicate understanding and agree with the plan and Code Status.  Code Status: Full Code  Consults called: None    Admission status: Inpatient    Time spent in minutes : 50 minutes Nolon Nations  APRN, MSN, FNP-C Patient Care [facility] [address] [place], [place] [address] [phone]   05/06/2020 at 6:09 PM

## 2020-05-07 DIAGNOSIS — D72829 Elevated white blood cell count, unspecified: Secondary | ICD-10-CM

## 2020-05-07 DIAGNOSIS — G894 Chronic pain syndrome: Secondary | ICD-10-CM

## 2020-05-07 DIAGNOSIS — D5700 Hb-SS disease with crisis, unspecified: Secondary | ICD-10-CM

## 2020-05-07 HISTORY — DX: Chronic pain syndrome: G89.4

## 2020-05-07 LAB — CBC
HCT: 30.4 % — ABNORMAL LOW (ref 39.0–52.0)
Hemoglobin: 10.5 g/dL — ABNORMAL LOW (ref 13.0–17.0)
MCH: 25.4 pg — ABNORMAL LOW (ref 26.0–34.0)
MCHC: 34.5 g/dL (ref 30.0–36.0)
MCV: 73.6 fL — ABNORMAL LOW (ref 80.0–100.0)
Platelets: 134 10*3/uL — ABNORMAL LOW (ref 150–400)
RBC: 4.13 MIL/uL — ABNORMAL LOW (ref 4.22–5.81)
RDW: 16 % — ABNORMAL HIGH (ref 11.5–15.5)
WBC: 10.4 10*3/uL (ref 4.0–10.5)
nRBC: 0.3 % — ABNORMAL HIGH (ref 0.0–0.2)

## 2020-05-07 LAB — HIV ANTIBODY (ROUTINE TESTING W REFLEX): HIV Screen 4th Generation wRfx: NONREACTIVE

## 2020-05-07 MED ORDER — OXYCODONE HCL 5 MG PO TABS
20.0000 mg | ORAL_TABLET | ORAL | Status: DC | PRN
Start: 2020-05-07 — End: 2020-05-09
  Administered 2020-05-07 – 2020-05-08 (×5): 20 mg via ORAL
  Filled 2020-05-07 (×5): qty 4

## 2020-05-07 MED ORDER — MORPHINE SULFATE 1 MG/ML IV SOLN PCA
INTRAVENOUS | Status: DC
Start: 2020-05-07 — End: 2020-05-09
  Administered 2020-05-07: 0 mg via INTRAVENOUS
  Administered 2020-05-07: 2 mg via INTRAVENOUS
  Administered 2020-05-08: 4 mg via INTRAVENOUS
  Administered 2020-05-08: 2 mg via INTRAVENOUS
  Administered 2020-05-08: 4 mg via INTRAVENOUS
  Administered 2020-05-08 (×2): 0 mg via INTRAVENOUS
  Administered 2020-05-09 (×2): 2 mg via INTRAVENOUS
  Filled 2020-05-07 (×2): qty 30

## 2020-05-07 NOTE — Progress Notes (Signed)
Subjective: Paul Campos is a 30 year old male with a medical history significant for sickle cell disease, chronic pain syndrome, opiate dependence and tolerance, and history of anemia of chronic disease that was admitted and sickle cell pain crisis. Patient says that pain is improved minimally overnight.  Pain is primarily to low back, and lower extremities he rates pain as 7/10.  He denies any headache, chest pain, urinary symptoms, nausea, vomiting, or diarrhea.  Objective:  Vital signs in last 24 hours:  Vitals:   05/08/20 0754 05/08/20 1003 05/08/20 1027 05/08/20 1352  BP:   127/83 110/74  Pulse:   70 (!) 52  Resp: 16 16 15 12   Temp:   98.2 F (36.8 C) 98.2 F (36.8 C)  TempSrc:   Oral Oral  SpO2: 100% 100% 100% 100%  Weight:      Height:        Intake/Output from previous day:   Intake/Output Summary (Last 24 hours) at 05/08/2020 1547 Last data filed at 05/08/2020 05/10/2020 Gross per 24 hour  Intake 817.21 ml  Output --  Net 817.21 ml    Physical Exam: General: Alert, awake, oriented x3, in no acute distress.  HEENT: [place]/AT PEERL, EOMI Neck: Trachea midline,  no masses, no thyromegal,y no JVD, no carotid bruit OROPHARYNX:  Moist, No exudate/ erythema/lesions.  Heart: Regular rate and rhythm, without murmurs, rubs, gallops, PMI non-displaced, no heaves or thrills on palpation.  Lungs: Clear to auscultation, no wheezing or rhonchi noted. No increased vocal fremitus resonant to percussion  Abdomen: Soft, nontender, nondistended, positive bowel sounds, no masses no hepatosplenomegaly noted..  Neuro: No focal neurological deficits noted cranial nerves II through XII grossly intact. DTRs 2+ bilaterally upper and lower extremities. Strength 5 out of 5 in bilateral upper and lower extremities. Musculoskeletal: No warm swelling or erythema around joints, no spinal tenderness noted. Psychiatric: Patient alert and oriented x3, good insight and cognition, good recent to remote  recall. Lymph node survey: No cervical axillary or inguinal lymphadenopathy noted.  Lab Results:  Basic Metabolic Panel:    Component Value Date/Time   NA 137 05/05/2020 2157   NA 140 04/29/2020 1211   K 4.2 05/05/2020 2157   CL 101 05/05/2020 2157   CO2 25 05/05/2020 2157   BUN 10 05/05/2020 2157   BUN 4 (L) 04/29/2020 1211   CREATININE 0.91 05/05/2020 2157   GLUCOSE 105 (H) 05/05/2020 2157   CALCIUM 9.0 05/05/2020 2157   CBC:    Component Value Date/Time   WBC 10.5 05/08/2020 0614   HGB 9.3 (L) 05/08/2020 0614   HGB 13.4 04/29/2020 1211   HCT 26.5 (L) 05/08/2020 0614   HCT 41.8 04/29/2020 1211   PLT 158 05/08/2020 0614   PLT 181 04/29/2020 1211   MCV 72.4 (L) 05/08/2020 0614   MCV 78 (L) 04/29/2020 1211   NEUTROABS 5.4 05/05/2020 2157   NEUTROABS 4.8 04/29/2020 1211   LYMPHSABS 3.0 05/05/2020 2157   LYMPHSABS 2.3 04/29/2020 1211   MONOABS 2.7 (H) 05/05/2020 2157   EOSABS 0.5 05/05/2020 2157   EOSABS 0.5 (H) 04/29/2020 1211   BASOSABS 0.1 05/05/2020 2157   BASOSABS 0.1 04/29/2020 1211    Recent Results (from the past 240 hour(s))  Resp Panel by RT-PCR (Flu A&B, Covid) Nasopharyngeal Swab     Status: None   Collection Time: 05/06/20  2:40 AM   Specimen: Nasopharyngeal Swab; Nasopharyngeal(NP) swabs in vial transport medium  Result Value Ref Range Status   SARS Coronavirus 2 by RT  PCR NEGATIVE NEGATIVE Final    Comment: (NOTE) SARS-CoV-2 target nucleic acids are NOT DETECTED.  The SARS-CoV-2 RNA is generally detectable in upper respiratory specimens during the acute phase of infection. The lowest concentration of SARS-CoV-2 viral copies this assay can detect is 138 copies/mL. A negative result does not preclude SARS-Cov-2 infection and should not be used as the sole basis for treatment or other patient management decisions. A negative result may occur with  improper specimen collection/handling, submission of specimen other than nasopharyngeal swab, presence  of viral mutation(s) within the areas targeted by this assay, and inadequate number of viral copies(<138 copies/mL). A negative result must be combined with clinical observations, patient history, and epidemiological information. The expected result is Negative.  Fact Sheet for Patients:  BloggerCourse.com  Fact Sheet for Healthcare Providers:  SeriousBroker.it  This test is no t yet approved or cleared by the [place] FDA and  has been authorized for detection and/or diagnosis of SARS-CoV-2 by FDA under an Emergency Use Authorization (EUA). This EUA will remain  in effect (meaning this test can be used) for the duration of the COVID-19 declaration under Section 564(b)(1) of the Act, 21 U.S.C.section 360bbb-3(b)(1), unless the authorization is terminated  or revoked sooner.       Influenza A by PCR NEGATIVE NEGATIVE Final   Influenza B by PCR NEGATIVE NEGATIVE Final    Comment: (NOTE) The Xpert Xpress SARS-CoV-2/FLU/RSV plus assay is intended as an aid in the diagnosis of influenza from Nasopharyngeal swab specimens and should not be used as a sole basis for treatment. Nasal washings and aspirates are unacceptable for Xpert Xpress SARS-CoV-2/FLU/RSV testing.  Fact Sheet for Patients: BloggerCourse.com  Fact Sheet for Healthcare Providers: SeriousBroker.it  This test is not yet approved or cleared by the [place] FDA and has been authorized for detection and/or diagnosis of SARS-CoV-2 by FDA under an Emergency Use Authorization (EUA). This EUA will remain in effect (meaning this test can be used) for the duration of the COVID-19 declaration under Section 564(b)(1) of the Act, 21 U.S.C. section 360bbb-3(b)(1), unless the authorization is terminated or revoked.  Performed at [facility], [address] [place], [place] [address]      Studies/Results: No results found.  Medications: Scheduled Meds: . enoxaparin (LOVENOX) injection  40 mg Subcutaneous Q24H  . folic acid  1 mg Oral Daily  . hydroxyurea  500 mg Oral Daily  . ketorolac  15 mg Intravenous Q6H  . morphine   Intravenous Q4H  . senna-docusate  1 tablet Oral BID   Continuous Infusions: . sodium chloride 10 mL/hr at 05/07/20 1222   PRN Meds:.diphenhydrAMINE, naloxone **AND** sodium chloride flush, ondansetron (ZOFRAN) IV, oxyCODONE, polyethylene glycol  Consultants:  None  Procedures:  None  Antibiotics:  None  Assessment/Plan: Principal Problem:   Sickle cell pain crisis (HCC) Active Problems:   Acute sickle cell crisis (HCC)   Leukocytosis   Chronic pain syndrome  Sickle cell disease with pain crisis: Decrease IV fluids to KVO Continue IV morphine PCA, decrease settings Toradol 15 mg IV every 6 hours for total of 5 days Oxycodone 20 mg every 4 hours as needed for severe breakthrough pain Monitor vital signs very closely, reevaluate pain scale regularly, and supplemental oxygen as needed.  Chronic pain syndrome: Continue home medications  Sickle cell anemia: Hemoglobin is stable and consistent with patient's baseline.  There is no clinical indication for blood transfusion at this time.  Continue to follow labs.  Leukocytosis: Stable.  More than likely reactive.  Continue to follow closely without antibiotics.  CBC in AM.  Code Status: Full Code Family Communication: N/A Disposition Plan: Not yet ready for discharge  Nolon Nations  APRN, MSN, FNP-C [facility] [address] [place], [place] [address] [phone]   If 5PM-8AM, please contact night-coverage.  05/08/2020, 3:47 PM LOS: 1 day

## 2020-05-08 DIAGNOSIS — D5700 Hb-SS disease with crisis, unspecified: Secondary | ICD-10-CM | POA: Diagnosis not present

## 2020-05-08 LAB — CBC
HCT: 26.5 % — ABNORMAL LOW (ref 39.0–52.0)
Hemoglobin: 9.3 g/dL — ABNORMAL LOW (ref 13.0–17.0)
MCH: 25.4 pg — ABNORMAL LOW (ref 26.0–34.0)
MCHC: 35.1 g/dL (ref 30.0–36.0)
MCV: 72.4 fL — ABNORMAL LOW (ref 80.0–100.0)
Platelets: 158 10*3/uL (ref 150–400)
RBC: 3.66 MIL/uL — ABNORMAL LOW (ref 4.22–5.81)
RDW: 16.2 % — ABNORMAL HIGH (ref 11.5–15.5)
WBC: 10.5 10*3/uL (ref 4.0–10.5)
nRBC: 0.5 % — ABNORMAL HIGH (ref 0.0–0.2)

## 2020-05-08 NOTE — Progress Notes (Signed)
Subjective: Paul Campos is a 30 year old male with a medical history significant for sickle cell disease, chronic pain syndrome, opiate dependence and tolerance, and history of anemia of chronic disease was admitted for sickle cell pain crisis. Patient says that pain has improved some overnight.  He rates pain as 5/10.  He says that he cannot manage at home at current pain intensity. He denies any headache, chest pain, shortness of breath, urinary symptoms, nausea, vomiting, or diarrhea.  Objective: Subjective:    Vital signs in last 24 hours:  Vitals:   05/08/20 0754 05/08/20 1003 05/08/20 1027 05/08/20 1352  BP:   127/83 110/74  Pulse:   70 (!) 52  Resp: 16 16 15 12   Temp:   98.2 F (36.8 C) 98.2 F (36.8 C)  TempSrc:   Oral Oral  SpO2: 100% 100% 100% 100%  Weight:      Height:        Intake/Output from previous day:   Intake/Output Summary (Last 24 hours) at 05/08/2020 1552 Last data filed at 05/08/2020 05/10/2020 Gross per 24 hour  Intake 817.21 ml  Output -  Net 817.21 ml    Physical Exam: General: Alert, awake, oriented x3, in no acute distress.  HEENT: [place]/AT [place]ERL, EOMI Neck: Trachea midline,  no masses, no thyromegal,y no JVD, no carotid bruit OROPHARYNX:  Moist, No exudate/ erythema/lesions.  Heart: Regular rate and rhythm, without murmurs, rubs, gallops, PMI non-displaced, no heaves or thrills on palpation.  Lungs: Clear to auscultation, no wheezing or rhonchi noted. No increased vocal fremitus resonant to percussion  Abdomen: Soft, nontender, nondistended, positive bowel sounds, no masses no hepatosplenomegaly noted..  Neuro: No focal neurological deficits noted cranial nerves II through XII grossly intact. DTRs 2+ bilaterally upper and lower extremities. Strength 5 out of 5 in bilateral upper and lower extremities. Musculoskeletal: No warm swelling or erythema around joints, no spinal tenderness noted. Psychiatric: Patient alert and oriented x3, good insight and  cognition, good recent to remote recall. Lymph node survey: No cervical axillary or inguinal lymphadenopathy noted.  Lab Results:  Basic Metabolic Panel:    Component Value Date/Time   NA 137 05/05/2020 2157   NA 140 04/29/2020 1211   K 4.2 05/05/2020 2157   CL 101 05/05/2020 2157   CO2 25 05/05/2020 2157   BUN 10 05/05/2020 2157   BUN 4 (L) 04/29/2020 1211   CREATININE 0.91 05/05/2020 2157   GLUCOSE 105 (H) 05/05/2020 2157   CALCIUM 9.0 05/05/2020 2157   CBC:    Component Value Date/Time   WBC 10.5 05/08/2020 0614   HGB 9.3 (L) 05/08/2020 0614   HGB 13.4 04/29/2020 1211   HCT 26.5 (L) 05/08/2020 0614   HCT 41.8 04/29/2020 1211   PLT 158 05/08/2020 0614   PLT 181 04/29/2020 1211   MCV 72.4 (L) 05/08/2020 0614   MCV 78 (L) 04/29/2020 1211   NEUTROABS 5.4 05/05/2020 2157   NEUTROABS 4.8 04/29/2020 1211   LYMPHSABS 3.0 05/05/2020 2157   LYMPHSABS 2.3 04/29/2020 1211   MONOABS 2.7 (H) 05/05/2020 2157   EOSABS 0.5 05/05/2020 2157   EOSABS 0.5 (H) 04/29/2020 1211   BASOSABS 0.1 05/05/2020 2157   BASOSABS 0.1 04/29/2020 1211    Recent Results (from the past 240 hour(s))  Resp Panel by RT-PCR (Flu A&B, Covid) Nasopharyngeal Swab     Status: None   Collection Time: 05/06/20  2:40 AM   Specimen: Nasopharyngeal Swab; Nasopharyngeal(NP) swabs in vial transport medium  Result Value Ref Range  Status   SARS Coronavirus 2 by RT PCR NEGATIVE NEGATIVE Final    Comment: (NOTE) SARS-CoV-2 target nucleic acids are NOT DETECTED.  The SARS-CoV-2 RNA is generally detectable in upper respiratory specimens during the acute phase of infection. The lowest concentration of SARS-CoV-2 viral copies this assay can detect is 138 copies/mL. A negative result does not preclude SARS-Cov-2 infection and should not be used as the sole basis for treatment or other patient management decisions. A negative result may occur with  improper specimen collection/handling, submission of specimen  other than nasopharyngeal swab, presence of viral mutation(s) within the areas targeted by this assay, and inadequate number of viral copies(<138 copies/mL). A negative result must be combined with clinical observations, patient history, and epidemiological information. The expected result is Negative.  Fact Sheet for Patients:  BloggerCourse.com  Fact Sheet for Healthcare Providers:  SeriousBroker.it  This test is no t yet approved or cleared by the [place] FDA and  has been authorized for detection and/or diagnosis of SARS-CoV-2 by FDA under an Emergency Use Authorization (EUA). This EUA will remain  in effect (meaning this test can be used) for the duration of the COVID-19 declaration under Section 564(b)(1) of the Act, 21 U.S.C.section 360bbb-3(b)(1), unless the authorization is terminated  or revoked sooner.       Influenza A by PCR NEGATIVE NEGATIVE Final   Influenza B by PCR NEGATIVE NEGATIVE Final    Comment: (NOTE) The Xpert Xpress SARS-CoV-2/FLU/RSV plus assay is intended as an aid in the diagnosis of influenza from Nasopharyngeal swab specimens and should not be used as a sole basis for treatment. Nasal washings and aspirates are unacceptable for Xpert Xpress SARS-CoV-2/FLU/RSV testing.  Fact Sheet for Patients: BloggerCourse.com  Fact Sheet for Healthcare Providers: SeriousBroker.it  This test is not yet approved or cleared by the [place] FDA and has been authorized for detection and/or diagnosis of SARS-CoV-2 by FDA under an Emergency Use Authorization (EUA). This EUA will remain in effect (meaning this test can be used) for the duration of the COVID-19 declaration under Section 564(b)(1) of the Act, 21 U.S.C. section 360bbb-3(b)(1), unless the authorization is terminated or revoked.  Performed at [facility], [address]  [place], [place] [address]     Studies/Results: No results found.  Medications: Scheduled Meds: . enoxaparin (LOVENOX) injection  40 mg Subcutaneous Q24H  . folic acid  1 mg Oral Daily  . hydroxyurea  500 mg Oral Daily  . ketorolac  15 mg Intravenous Q6H  . morphine   Intravenous Q4H  . senna-docusate  1 tablet Oral BID   Continuous Infusions: . sodium chloride 10 mL/hr at 05/07/20 1222   PRN Meds:.diphenhydrAMINE, naloxone **AND** sodium chloride flush, ondansetron (ZOFRAN) IV, oxyCODONE, polyethylene glycol  Consultants:  None  Procedures:  None  Antibiotics:  None  Assessment/Plan: Principal Problem:   Sickle cell pain crisis (HCC) Active Problems:   Acute sickle cell crisis (HCC)   Leukocytosis   Chronic pain syndrome  Sickle cell disease with pain crisis: Continue IV fluids to [facility] Continue IV morphine PCA without any changes in settings.  Settings were decreased on yesterday. Toradol 15 mg IV every 6 hours for total of 5 days Oxycodone 20 mg every 4 hours as needed for severe breakthrough pain. Monitor vital signs very closely, reevaluate pain scale regularly, and supplemental oxygen as needed.  Chronic pain syndrome: Continue home medications  Sickle cell anemia: Hemoglobin is stable and consistent with patient's baseline.  There is no clinical indication for blood transfusion at this time.  Continue to follow labs closely.  Leukocytosis: Stable.  More than likely reactive.  Continue to follow closely without antibiotics.  CBC in a.m. Code Status: Full Code Family Communication: N/A Disposition Plan: Not yet ready for discharge  Lachina Rennis Petty  APRN, MSN, FNP-C [facility] [address] [place], [place] [address] [phone]  If 5PM-8AM, please contact night-coverage.  05/08/2020, 3:52 PM  LOS: 2 days

## 2020-05-09 NOTE — Discharge Summary (Signed)
Physician Discharge Summary  Paul Campos SAY:[id number] DOB: March 25, 1990 DOA: 05/05/2020  PCP: Massie Maroon, FNP  Admit date: 05/05/2020  Discharge date: 05/09/2020  Discharge Diagnoses:  Principal Problem:   Sickle cell pain crisis (HCC) Active Problems:   Acute sickle cell crisis (HCC)   Leukocytosis   Chronic pain syndrome   Discharge Condition: Stable  Disposition:   Follow-up Information    Massie Maroon, FNP Follow up in 2 week(s).   Specialty: Family Medicine Contact information: [address] [place] [address] [phone]              Pt is discharged home in good condition and is to follow up with Massie Maroon, FNP this week to have labs evaluated. Paul Campos is instructed to increase activity slowly and balance with rest for the next few days, and use prescribed medication to complete treatment of pain  Diet: Regular Wt Readings from Last 3 Encounters:  05/06/20 79.3 kg  04/29/20 80.3 kg  01/15/20 79.8 kg    History of present illness:  Paul Campos  is a 30 y.o. male with a medical history significant for sickle cell disease, chronic pain syndrome, opiate dependence, and tolerance presented to ER with complaints of low back and lower extremity pain. Patient says that pain intensity increased 2 days ago and has been unrelieved by home medications.  He attributes current pain crisis to changes in weather.  Pain is typically relieved by oxycodone, however medication has been mostly ineffective.  He currently rates pain as 9/10 characterized as constant and throbbing.  Pain is primarily to upper and lower extremities and low back.  He denies any subjective fever, chills, chest pain, shortness of breath, or persistent cough.  No urinary symptoms, nausea, vomiting, or diarrhea.  No sick contacts, recent travel, or exposure to COVID-19.   ER Course:  Vital signs stable.  BP 101/62, pulse 57, temperature 98.3 F, respirations 16.   Oxygen saturation is 97% on RA. Complete blood count shows hemoglobin 12.1, WBCs 11.6, and platelets 164,000. Comprehensive metabolic panel shows total bilirubin of 1.6, otherwise within normal limits.  COVID-19 test negative.  Patient's pain persists despite IV fluids and IV Dilaudid.  Sickle cell team contacted for admission and patient admitted to MedSurg for further management of sickle cell pain crisis.  Hospital Course:  Patient was admitted for sickle cell pain crisis and managed appropriately with IVF, IV Morphine via PCA and IV Toradol, as well as other adjunct therapies per sickle cell pain management protocols.  Patient's pain intensity is 4/10.  He will transition to oxycodone 20 mg every 4 hours as needed for severe pain.  Also, utilize ibuprofen and Tylenol interchangeably. All laboratory values remain largely consistent with patient's baseline throughout admission. Patient will continue hydroxyurea and folic acid. Also, advised to continue to hydrate with 64 ounces of fluid per day  Patient was therefore discharged home today in a hemodynamically stable condition.   Paul Campos will follow-up with PCP within 1 week of this discharge. Paul Campos was counseled extensively about nonpharmacologic means of pain management, patient verbalized understanding and was appreciative of  the care received during this admission.   We discussed the need for good hydration, monitoring of hydration status, avoidance of heat, cold, stress, and infection triggers. We discussed the need to be adherent with taking Hydrea and other home medications. Patient was reminded of the need to seek medical attention immediately if any symptom of bleeding, anemia, or infection  occurs.  Discharge Exam: Vitals:   05/09/20 0548 05/09/20 0729  BP: 104/65   Pulse: 69   Resp: 16 14  Temp: 98.2 F (36.8 C)   SpO2: 98% 99%   Vitals:   05/09/20 0015 05/09/20 0510 05/09/20 0548 05/09/20 0729  BP:   104/65   Pulse:   69    Resp: 15 16 16 14   Temp:   98.2 F (36.8 C)   TempSrc:   Oral   SpO2: 100% 100% 98% 99%  Weight:      Height:        General appearance : Awake, alert, not in any distress. Speech Clear. Not toxic looking HEENT: Atraumatic and Normocephalic, pupils equally reactive to light and accomodation Neck: Supple, no JVD. No cervical lymphadenopathy.  Chest: Good air entry bilaterally, no added sounds  CVS: S1 S2 regular, no murmurs.  Abdomen: Bowel sounds present, Non tender and not distended with no guarding, rigidity or rebound. Extremities: B/L Lower Ext shows no edema, both legs are warm to touch Neurology: Awake alert, and oriented X 3, CN II-XII intact, Non focal Skin: No Rash  Discharge Instructions  Discharge Instructions    Discharge patient   Complete by: As directed    Discharge disposition: 01-Home or Self Care   Discharge patient date: 05/09/2020     Allergies as of 05/09/2020      Reactions   Dilaudid [hydromorphone Hcl] Hives   Hydromorphone Itching   Morphine okay.   Cefotaxime Itching, Rash   Hives      Medication List    TAKE these medications   ergocalciferol 1.25 MG (50000 UT) capsule Commonly known as: VITAMIN D2 Take 1 capsule (50,000 Units total) by mouth once a week.   folic acid 800 MCG tablet Commonly known as: FOLVITE Take 1 tablet (800 mcg total) by mouth daily.   hydroxyurea 500 MG capsule Commonly known as: HYDREA Take 1 capsule (500 mg total) by mouth daily. May take with food to minimize GI side effects.   ibuprofen 800 MG tablet Commonly known as: ADVIL Take 1 tablet (800 mg total) by mouth every 8 (eight) hours as needed. What changed: reasons to take this   Oxycodone HCl 20 MG Tabs Take 1 tablet (20 mg total) by mouth every 4 (four) hours as needed. What changed: reasons to take this       The results of significant diagnostics from this hospitalization (including imaging, microbiology, ancillary and laboratory) are listed  below for reference.    Significant Diagnostic Studies: DG Chest 2 View  Result Date: 05/05/2020 CLINICAL DATA:  Low-grade fever EXAM: CHEST - 2 VIEW COMPARISON:  05/27/2016 FINDINGS: No focal opacity or pleural effusion. Stable cardiomediastinal silhouette. No pneumothorax. IMPRESSION: No active cardiopulmonary disease. Electronically Signed   By: 07/27/2016 M.D.   On: 05/05/2020 22:45    Microbiology: Recent Results (from the past 240 hour(s))  Resp Panel by RT-PCR (Flu A&B, Covid) Nasopharyngeal Swab     Status: None   Collection Time: 05/06/20  2:40 AM   Specimen: Nasopharyngeal Swab; Nasopharyngeal(NP) swabs in vial transport medium  Result Value Ref Range Status   SARS Coronavirus 2 by RT PCR NEGATIVE NEGATIVE Final    Comment: (NOTE) SARS-CoV-2 target nucleic acids are NOT DETECTED.  The SARS-CoV-2 RNA is generally detectable in upper respiratory specimens during the acute phase of infection. The lowest concentration of SARS-CoV-2 viral copies this assay can detect is 138 copies/mL. A negative result does not preclude  SARS-Cov-2 infection and should not be used as the sole basis for treatment or other patient management decisions. A negative result may occur with  improper specimen collection/handling, submission of specimen other than nasopharyngeal swab, presence of viral mutation(s) within the areas targeted by this assay, and inadequate number of viral copies(<138 copies/mL). A negative result must be combined with clinical observations, patient history, and epidemiological information. The expected result is Negative.  Fact Sheet for Patients:  BloggerCourse.com  Fact Sheet for Healthcare Providers:  SeriousBroker.it  This test is no t yet approved or cleared by the [place] FDA and  has been authorized for detection and/or diagnosis of SARS-CoV-2 by FDA under an Emergency Use Authorization (EUA). This EUA  will remain  in effect (meaning this test can be used) for the duration of the COVID-19 declaration under Section 564(b)(1) of the Act, 21 U.S.C.section 360bbb-3(b)(1), unless the authorization is terminated  or revoked sooner.       Influenza A by PCR NEGATIVE NEGATIVE Final   Influenza B by PCR NEGATIVE NEGATIVE Final    Comment: (NOTE) The Xpert Xpress SARS-CoV-2/FLU/RSV plus assay is intended as an aid in the diagnosis of influenza from Nasopharyngeal swab specimens and should not be used as a sole basis for treatment. Nasal washings and aspirates are unacceptable for Xpert Xpress SARS-CoV-2/FLU/RSV testing.  Fact Sheet for Patients: BloggerCourse.com  Fact Sheet for Healthcare Providers: SeriousBroker.it  This test is not yet approved or cleared by the [place] FDA and has been authorized for detection and/or diagnosis of SARS-CoV-2 by FDA under an Emergency Use Authorization (EUA). This EUA will remain in effect (meaning this test can be used) for the duration of the COVID-19 declaration under Section 564(b)(1) of the Act, 21 U.S.C. section 360bbb-3(b)(1), unless the authorization is terminated or revoked.  Performed at [facility], [address] [place], [place] [address]      Labs: Basic Metabolic Panel: Recent Labs  Lab 05/05/20 2157  NA 137  K 4.2  CL 101  CO2 25  GLUCOSE 105*  BUN 10  CREATININE 0.91  CALCIUM 9.0   Liver Function Tests: Recent Labs  Lab 05/05/20 2157  AST 39  ALT 20  ALKPHOS 60  BILITOT 1.6*  PROT 8.1  ALBUMIN 4.3   No results for input(s): LIPASE, AMYLASE in the last 168 hours. No results for input(s): AMMONIA in the last 168 hours. CBC: Recent Labs  Lab 05/05/20 2157 05/07/20 0553 05/08/20 0614  WBC 11.6* 10.4 10.5  NEUTROABS 5.4  --   --   HGB 12.1* 10.5* 9.3*  HCT 34.5* 30.4* 26.5*  MCV 72.0* 73.6* 72.4*  PLT 164 134* 158   Cardiac  Enzymes: No results for input(s): CKTOTAL, CKMB, CKMBINDEX, TROPONINI in the last 168 hours. BNP: Invalid input(s): POCBNP CBG: No results for input(s): GLUCAP in the last 168 hours.  Time coordinating discharge: 35 minutes  Signed:  Nolon Nations  APRN, MSN, FNP-C Patient Care [facility] [address] [place], [place] [address] [phone]  [facility] 05/09/2020, 9:39 AM

## 2020-05-09 NOTE — Plan of Care (Signed)
  Problem: Education: Goal: Knowledge of General Education information will improve Description: Including pain rating scale, medication(s)/side effects and non-pharmacologic comfort measures Outcome: Adequate for Discharge   Problem: Health Behavior/Discharge Planning: Goal: Ability to manage health-related needs will improve Outcome: Adequate for Discharge   Problem: Clinical Measurements: Goal: Ability to maintain clinical measurements within normal limits will improve Outcome: Adequate for Discharge Goal: Will remain free from infection Outcome: Adequate for Discharge Goal: Diagnostic test results will improve Outcome: Adequate for Discharge Goal: Respiratory complications will improve Outcome: Adequate for Discharge Goal: Cardiovascular complication will be avoided Outcome: Adequate for Discharge   Problem: Activity: Goal: Risk for activity intolerance will decrease Outcome: Adequate for Discharge   Problem: Nutrition: Goal: Adequate nutrition will be maintained Outcome: Adequate for Discharge   Problem: Coping: Goal: Level of anxiety will decrease Outcome: Adequate for Discharge   Problem: Elimination: Goal: Will not experience complications related to bowel motility Outcome: Adequate for Discharge Goal: Will not experience complications related to urinary retention Outcome: Adequate for Discharge   Problem: Pain Managment: Goal: General experience of comfort will improve Outcome: Adequate for Discharge   Problem: Safety: Goal: Ability to remain free from injury will improve Outcome: Adequate for Discharge   Problem: Skin Integrity: Goal: Risk for impaired skin integrity will decrease Outcome: Adequate for Discharge   Problem: Education: Goal: Knowledge of vaso-occlusive preventative measures will improve Outcome: Adequate for Discharge Goal: Awareness of infection prevention will improve Outcome: Adequate for Discharge Goal: Awareness of signs and  symptoms of anemia will improve Outcome: Adequate for Discharge Goal: Long-term complications will improve Outcome: Adequate for Discharge   Problem: Self-Care: Goal: Ability to incorporate actions that prevent/reduce pain crisis will improve Outcome: Adequate for Discharge   Problem: Bowel/Gastric: Goal: Gut motility will be maintained Outcome: Adequate for Discharge   Problem: Tissue Perfusion: Goal: Complications related to inadequate tissue perfusion will be avoided or minimized Outcome: Adequate for Discharge   Problem: Respiratory: Goal: Pulmonary complications will be avoided or minimized Outcome: Adequate for Discharge Goal: Acute Chest Syndrome will be identified early to prevent complications Outcome: Adequate for Discharge   Problem: Fluid Volume: Goal: Ability to maintain a balanced intake and output will improve Outcome: Adequate for Discharge   Problem: Sensory: Goal: Pain level will decrease with appropriate interventions Outcome: Adequate for Discharge   Problem: Health Behavior: Goal: Postive changes in compliance with treatment and prescription regimens will improve Outcome: Adequate for Discharge   

## 2020-05-09 NOTE — Discharge Instructions (Signed)
Sickle Cell Anemia, Adult  Sickle cell anemia is a condition where your red blood cells are shaped like sickles. Red blood cells carry oxygen through the body. Sickle-shaped cells do not live as long as normal red blood cells. They also clump together and block blood from flowing through the blood vessels. This prevents the body from getting enough oxygen. Sickle cell anemia causes organ damage and pain. It also increases the risk of infection. Follow these instructions at home: Medicines  Take over-the-counter and prescription medicines only as told by your doctor.  If you were prescribed an antibiotic medicine, take it as told by your doctor. Do not stop taking the antibiotic even if you start to feel better.  If you develop a fever, do not take medicines to lower the fever right away. Tell your doctor about the fever. Managing pain, stiffness, and swelling  Try these methods to help with pain: ? Use a heating pad. ? Take a warm bath. ? Distract yourself, such as by watching TV. Eating and drinking  Drink enough fluid to keep your pee (urine) clear or pale yellow. Drink more in hot weather and during exercise.  Limit or avoid alcohol.  Eat a healthy diet. Eat plenty of fruits, vegetables, whole grains, and lean protein.  Take vitamins and supplements as told by your doctor. Traveling  When traveling, keep these with you: ? Your medical information. ? The names of your doctors. ? Your medicines.  If you need to take an airplane, talk to your doctor first. Activity  Rest often.  Avoid exercises that make your heart beat much faster, such as jogging. General instructions  Do not use products that have nicotine or tobacco, such as cigarettes and e-cigarettes. If you need help quitting, ask your doctor.  Consider wearing a medical alert bracelet.  Avoid being in high places (high altitudes), such as mountains.  Avoid very hot or cold temperatures.  Avoid places where the  temperature changes a lot.  Keep all follow-up visits as told by your doctor. This is important. Contact a doctor if:  A joint hurts.  Your feet or hands hurt or swell.  You feel tired (fatigued). Get help right away if:  You have symptoms of infection. These include: ? Fever. ? Chills. ? Being very tired. ? Irritability. ? Poor eating. ? Throwing up (vomiting).  You feel dizzy or faint.  You have new stomach pain, especially on the left side.  You have a an erection (priapism) that lasts more than 4 hours.  You have numbness in your arms or legs.  You have a hard time moving your arms or legs.  You have trouble talking.  You have pain that does not go away when you take medicine.  You are short of breath.  You are breathing fast.  You have a long-term cough.  You have pain in your chest.  You have a bad headache.  You have a stiff neck.  Your stomach looks bloated even though you did not eat much.  Your skin is pale.  You suddenly cannot see well. Summary  Sickle cell anemia is a condition where your red blood cells are shaped like sickles.  Follow your doctor's advice on ways to manage pain, food to eat, activities to do, and steps to take for safe travel.  Get medical help right away if you have any signs of infection, such as a fever. This information is not intended to replace advice given to you by   your health care provider. Make sure you discuss any questions you have with your health care provider. Document Revised: 07/05/2019 Document Reviewed: 07/05/2019 Elsevier Patient Education  2021 Elsevier Inc.  

## 2020-05-12 ENCOUNTER — Telehealth: Payer: Self-pay | Admitting: *Deleted

## 2020-05-12 NOTE — Telephone Encounter (Signed)
Transition Care Management Follow-up Telephone Call  Date of discharge and from where: 3-18  [facility]  How have you been since you were released from the hospital? Feeling much better  Any questions or concerns? No  Items Reviewed:  Did the pt receive and understand the discharge instructions provided? Yes   Medications obtained and verified? na  Other? No   Any new allergies since your discharge? No   Dietary orders reviewed? yes  Do you have support at home? Yes     Functional Questionnaire: (I = Independent and D = Dependent) ADLs: I      Bathing/Dressing- I  Meal Prep- I  Eating- I  Maintaining continence- I  Transferring/Ambulation- I  Managing Meds- I  Follow up appointments reviewed:   PCP Hospital f/u appt confirmed? Yes  Scheduled to see Primary Care in 2 weeks  Specialist Hospital f/u appt confirmed? No    Are transportation arrangements needed? No   If their condition worsens, is the pt aware to call PCP or go to the Emergency Dept.?  yes  Was the patient provided with contact information for the PCP's office or ED? yes  Was to pt encouraged to call back with questions or concerns? yes

## 2020-05-13 LAB — DRUG SCREEN 764883 11+OXYCO+ALC+CRT-BUND
Amphetamines, Urine: NEGATIVE ng/mL
BENZODIAZ UR QL: NEGATIVE ng/mL
Barbiturate: NEGATIVE ng/mL
Cocaine (Metabolite): NEGATIVE ng/mL
Creatinine: 109.7 mg/dL (ref 20.0–300.0)
Ethanol: NEGATIVE %
Meperidine: NEGATIVE ng/mL
Methadone Screen, Urine: NEGATIVE ng/mL
OPIATE SCREEN URINE: NEGATIVE ng/mL
Oxycodone/Oxymorphone, Urine: NEGATIVE ng/mL
Phencyclidine: NEGATIVE ng/mL
Propoxyphene: NEGATIVE ng/mL
Tramadol: NEGATIVE ng/mL
pH, Urine: 7.3 (ref 4.5–8.9)

## 2020-05-13 LAB — CANNABINOID CONFIRMATION, UR
CANNABINOIDS: POSITIVE — AB
Carboxy THC GC/MS Conf: 112 ng/mL

## 2020-05-16 ENCOUNTER — Other Ambulatory Visit: Payer: Self-pay

## 2020-05-16 DIAGNOSIS — D571 Sickle-cell disease without crisis: Secondary | ICD-10-CM

## 2020-05-16 DIAGNOSIS — G8929 Other chronic pain: Secondary | ICD-10-CM

## 2020-05-19 ENCOUNTER — Other Ambulatory Visit: Payer: Self-pay

## 2020-05-19 DIAGNOSIS — G8929 Other chronic pain: Secondary | ICD-10-CM

## 2020-05-19 DIAGNOSIS — D571 Sickle-cell disease without crisis: Secondary | ICD-10-CM

## 2020-05-20 ENCOUNTER — Other Ambulatory Visit: Payer: Self-pay | Admitting: Family Medicine

## 2020-05-20 DIAGNOSIS — D571 Sickle-cell disease without crisis: Secondary | ICD-10-CM

## 2020-05-20 DIAGNOSIS — G8929 Other chronic pain: Secondary | ICD-10-CM

## 2020-05-20 MED ORDER — OXYCODONE HCL 20 MG PO TABS: 20.0000 mg | ORAL_TABLET | ORAL | 0 refills | Status: DC | PRN

## 2020-05-20 NOTE — Progress Notes (Signed)
Reviewed PDMP substance reporting system prior to prescribing opiate medications. No inconsistencies noted.   Meds ordered this encounter  Medications  . Oxycodone HCl 20 MG TABS    Sig: Take 1 tablet (20 mg total) by mouth every 4 (four) hours as needed (pain).    Dispense:  60 tablet    Refill:  0    Order Specific Question:   Supervising Provider    Answer:   Quentin Angst [[id number]]    Nolon Nations  APRN, MSN, FNP-C Patient Care [facility] [address] [place], [place] [address] [phone]

## 2020-06-11 ENCOUNTER — Other Ambulatory Visit: Payer: Self-pay

## 2020-06-11 DIAGNOSIS — D571 Sickle-cell disease without crisis: Secondary | ICD-10-CM

## 2020-06-11 DIAGNOSIS — G8929 Other chronic pain: Secondary | ICD-10-CM

## 2020-06-12 ENCOUNTER — Other Ambulatory Visit: Payer: Self-pay

## 2020-06-12 ENCOUNTER — Other Ambulatory Visit: Payer: Self-pay | Admitting: Family Medicine

## 2020-06-12 DIAGNOSIS — G8929 Other chronic pain: Secondary | ICD-10-CM

## 2020-06-12 DIAGNOSIS — D571 Sickle-cell disease without crisis: Secondary | ICD-10-CM

## 2020-06-12 MED ORDER — OXYCODONE HCL 20 MG PO TABS: 20.0000 mg | ORAL_TABLET | ORAL | 0 refills | Status: DC | PRN

## 2020-06-12 NOTE — Progress Notes (Signed)
Reviewed PDMP substance reporting system prior to prescribing opiate medications. No inconsistencies noted.   Meds ordered this encounter  Medications  . Oxycodone HCl 20 MG TABS    Sig: Take 1 tablet (20 mg total) by mouth every 4 (four) hours as needed (pain).    Dispense:  60 tablet    Refill:  0    Order Specific Question:   Supervising Provider    Answer:   [name], [name] [[id number]]    Latina Paul Campos Cuyler Vandyken  APRN, MSN, FNP-C [facility] [address]  [place], [place] [address] [phone]  

## 2020-06-30 ENCOUNTER — Other Ambulatory Visit: Payer: Self-pay

## 2020-06-30 DIAGNOSIS — G8929 Other chronic pain: Secondary | ICD-10-CM

## 2020-06-30 DIAGNOSIS — D571 Sickle-cell disease without crisis: Secondary | ICD-10-CM

## 2020-07-01 ENCOUNTER — Other Ambulatory Visit: Payer: Self-pay | Admitting: Family Medicine

## 2020-07-01 ENCOUNTER — Other Ambulatory Visit: Payer: Self-pay

## 2020-07-01 DIAGNOSIS — G8929 Other chronic pain: Secondary | ICD-10-CM

## 2020-07-01 DIAGNOSIS — D571 Sickle-cell disease without crisis: Secondary | ICD-10-CM

## 2020-07-01 MED ORDER — OXYCODONE HCL 20 MG PO TABS: 20.0000 mg | ORAL_TABLET | ORAL | 0 refills | Status: DC | PRN

## 2020-07-01 NOTE — Progress Notes (Signed)
Reviewed PDMP substance reporting system prior to prescribing opiate medications. No inconsistencies noted.   Meds ordered this encounter  Medications  . Oxycodone HCl 20 MG TABS    Sig: Take 1 tablet (20 mg total) by mouth every 4 (four) hours as needed (pain).    Dispense:  60 tablet    Refill:  0    Order Specific Question:   Supervising Provider    Answer:   Quentin Angst [[id number]]    Nolon Nations  APRN, MSN, FNP-C Patient Care [facility] [address] [place], [place] [address] [phone]

## 2020-07-20 ENCOUNTER — Emergency Department (HOSPITAL_COMMUNITY)
Admission: EM | Admit: 2020-07-20 | Discharge: 2020-07-20 | Disposition: A | Discharge status: Home / Self Care | Payer: Medicaid Other | Attending: Emergency Medicine | Admitting: Emergency Medicine

## 2020-07-20 ENCOUNTER — Other Ambulatory Visit: Payer: Self-pay

## 2020-07-20 DIAGNOSIS — D5700 Hb-SS disease with crisis, unspecified: Secondary | ICD-10-CM | POA: Diagnosis present

## 2020-07-20 LAB — CBC WITH DIFFERENTIAL/PLATELET
Abs Immature Granulocytes: 0.02 10*3/uL (ref 0.00–0.07)
Basophils Absolute: 0 10*3/uL (ref 0.0–0.1)
Basophils Relative: 1 %
Eosinophils Absolute: 0.5 10*3/uL (ref 0.0–0.5)
Eosinophils Relative: 6 %
HCT: 35.3 % — ABNORMAL LOW (ref 39.0–52.0)
Hemoglobin: 12.5 g/dL — ABNORMAL LOW (ref 13.0–17.0)
Immature Granulocytes: 0 %
Lymphocytes Relative: 41 %
Lymphs Abs: 3.6 10*3/uL (ref 0.7–4.0)
MCH: 25.7 pg — ABNORMAL LOW (ref 26.0–34.0)
MCHC: 35.4 g/dL (ref 30.0–36.0)
MCV: 72.5 fL — ABNORMAL LOW (ref 80.0–100.0)
Monocytes Absolute: 1 10*3/uL (ref 0.1–1.0)
Monocytes Relative: 12 %
Neutro Abs: 3.4 10*3/uL (ref 1.7–7.7)
Neutrophils Relative %: 40 %
Platelets: 179 10*3/uL (ref 150–400)
RBC: 4.87 MIL/uL (ref 4.22–5.81)
RDW: 14.4 % (ref 11.5–15.5)
WBC: 8.6 10*3/uL (ref 4.0–10.5)
nRBC: 0 % (ref 0.0–0.2)

## 2020-07-20 LAB — BASIC METABOLIC PANEL
Anion gap: 7 (ref 5–15)
BUN: 7 mg/dL (ref 6–20)
CO2: 25 mmol/L (ref 22–32)
Calcium: 9 mg/dL (ref 8.9–10.3)
Chloride: 107 mmol/L (ref 98–111)
Creatinine, Ser: 0.75 mg/dL (ref 0.61–1.24)
GFR, Estimated: >60 mL/min (ref >60)
Glucose, Bld: 83 mg/dL (ref 70–99)
Potassium: 3.6 mmol/L (ref 3.5–5.1)
Sodium: 139 mmol/L (ref 135–145)

## 2020-07-20 LAB — RETICULOCYTES
Immature Retic Fract: 33.6 % — ABNORMAL HIGH (ref 2.3–15.9)
RBC.: 4.87 MIL/uL (ref 4.22–5.81)
Retic Count, Absolute: 172.4 10*3/uL (ref 19.0–186.0)
Retic Ct Pct: 3.5 % — ABNORMAL HIGH (ref 0.4–3.1)

## 2020-07-20 MED ORDER — MORPHINE SULFATE (PF) 4 MG/ML IV SOLN
10.0000 mg | INTRAVENOUS | Status: AC
Start: 2020-07-20 — End: 2020-07-20
  Administered 2020-07-20: 10 mg via INTRAVENOUS
  Filled 2020-07-20: qty 3

## 2020-07-20 MED ORDER — KETOROLAC TROMETHAMINE 15 MG/ML IJ SOLN
15.0000 mg | INTRAMUSCULAR | Status: AC
  Administered 2020-07-20: 15 mg via INTRAVENOUS
  Filled 2020-07-20: qty 1

## 2020-07-20 MED ORDER — MORPHINE SULFATE (PF) 4 MG/ML IV SOLN
8.0000 mg | INTRAVENOUS | Status: AC
  Administered 2020-07-20: 8 mg via INTRAVENOUS
  Filled 2020-07-20: qty 2

## 2020-07-20 MED ORDER — DIPHENHYDRAMINE HCL 25 MG PO CAPS
25.0000 mg | ORAL_CAPSULE | ORAL | Status: DC | PRN
  Administered 2020-07-20: 25 mg via ORAL
  Filled 2020-07-20: qty 2

## 2020-07-20 MED ORDER — SODIUM CHLORIDE 0.45 % IV SOLN
INTRAVENOUS | Status: DC
  Administered 2020-07-20: 150 mL/h via INTRAVENOUS

## 2020-07-20 NOTE — Discharge Instructions (Addendum)
1. Medications: usual home medications 2. Treatment: rest, drink plenty of fluids,  3. Follow Up: Please followup with your primary doctor in 2 days for discussion of your diagnoses and further evaluation after today's visit; if you do not have a primary care doctor use the resource guide provided to find one; Please return to the ER for new or worsening symptoms, fever, chills, chest pain or shortness of breath

## 2020-07-20 NOTE — ED Triage Notes (Signed)
Pt came in with c/o SCC. He has taken oxycodone. Pt states it started at 1100 this morning. Pain in bilateral legs

## 2020-07-20 NOTE — ED Notes (Signed)
Patient complaining of sickle cell crisis since 07/19/2020. He states both legs are hurting.

## 2020-07-20 NOTE — ED Provider Notes (Signed)
[facility]-EMERGENCY DEPT Provider Note   CSN: [id number] Arrival date & time: 07/20/20  0015     History Chief Complaint  Patient presents with  . Sickle Cell Pain Crisis    Paul Campos is a 30 y.o. male presents to the Emergency Department complaining of gradual, persistent, progressively worsening bilateral leg onset around 11am.  Patient is prescribed oxycodone 20 mg every 6 hours as needed.  He has been taking this without relief.  Patient denies any specific triggers.  States pain is severe.  Denies abdominal pain, nausea, vomiting, diarrhea, chest pain, shortness of breath, palpitations.  Patient denies fevers or chills, recent sick contacts.  Nothing seems to make the symptoms better or worse at this time.  Additionally he takes hydroxyurea and denies any missed doses.   The history is provided by the patient and medical records. No language interpreter was used.       Past Medical History:  Diagnosis Date  . Headache   . Sickle cell anemia [facility])     Patient Active Problem List   Diagnosis Date Noted  . Leukocytosis 05/07/2020  . Chronic pain syndrome 05/07/2020  . Acute sickle cell crisis (HCC) 05/06/2020  . Nausea & vomiting 11/05/2018  . Sickle cell anemia with crisis (HCC) 11/04/2018  . 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 Hypokalemia 08/21/2012  . Thrombocytopenia, unspecified (HCC) 08/19/2012  . CAP (community acquired pneumonia) 08/18/2012  . Sickle cell pain crisis (HCC) 05/06/2012  . Chest pain 05/06/2012    Past Surgical History:  Procedure Laterality Date  . APPENDECTOMY         Family History  Problem Relation Age of Onset  . Lung cancer Mother   . Migraines Mother     Social History   Tobacco Use  . Smoking status: Never Smoker  . Smokeless tobacco: Never Used  Vaping Use  . Vaping Use: Never used  Substance Use Topics  . Alcohol use: No  . Drug use: No    Home Medications Prior to Admission  medications   Medication Sig Start Date End Date Taking? Authorizing Provider  folic acid (FOLVITE) 800 MCG tablet Take 1 tablet (800 mcg total) by mouth daily. 04/29/20  Yes Massie Maroon, FNP  hydroxyurea (HYDREA) 500 MG capsule Take 1 capsule (500 mg total) by mouth daily. May take with food to minimize GI side effects. 04/29/20  Yes Massie Maroon, FNP  ibuprofen (ADVIL) 800 MG tablet Take 1 tablet (800 mg total) by mouth every 8 (eight) hours as needed. Patient taking differently: Take 800 mg by mouth every 8 (eight) hours as needed for moderate pain. 02/06/19  Yes Massie Maroon, FNP  Oxycodone HCl 20 MG TABS Take 1 tablet (20 mg total) by mouth every 4 (four) hours as needed (pain). 07/01/20  Yes Massie Maroon, FNP  ergocalciferol (VITAMIN D2) 1.25 MG (50000 UT) capsule Take 1 capsule (50,000 Units total) by mouth once a week. Patient not taking: No sig reported 01/19/20   Massie Maroon, FNP    Allergies    Dilaudid [hydromorphone hcl], Hydromorphone, and Cefotaxime  Review of Systems   Review of Systems  Constitutional: Negative for appetite change, diaphoresis, fatigue, fever and unexpected weight change.  HENT: Negative for mouth sores.   Eyes: Negative for visual disturbance.  Respiratory: Negative for cough, chest tightness, shortness of breath and wheezing.   Cardiovascular: Negative for chest pain.  Gastrointestinal: Negative for abdominal pain, constipation, diarrhea, nausea  and vomiting.  Endocrine: Negative for polydipsia, polyphagia and polyuria.  Genitourinary: Negative for dysuria, frequency, hematuria and urgency.  Musculoskeletal: Positive for arthralgias and myalgias. Negative for back pain and neck stiffness.  Skin: Negative for rash.  Allergic/Immunologic: Negative for immunocompromised state.  Neurological: Negative for syncope, light-headedness and headaches.  Hematological: Does not bruise/bleed easily.  Psychiatric/Behavioral: Negative for sleep  disturbance. The patient is not nervous/anxious.     Physical Exam Updated Vital Signs BP 125/82 (BP Location: Left Arm)   Pulse 72   Temp 98.7 F (37.1 C) (Oral)   Resp 20   Ht 6' (1.829 m)   Wt 86.2 kg   SpO2 100%   BMI 25.77 kg/m   Physical Exam Vitals and nursing note reviewed.  Constitutional:      General: He is not in acute distress.    Appearance: He is well-developed. He is not diaphoretic.     Comments: Awake, alert, nontoxic appearing, NAD  HENT:     Head: Normocephalic and atraumatic.  Eyes:     General: No scleral icterus.    Conjunctiva/sclera: Conjunctivae normal.  Cardiovascular:     Rate and Rhythm: Normal rate and regular rhythm.     Pulses:          Radial pulses are 2+ on the right side and 2+ on the left side.       Dorsalis pedis pulses are 2+ on the right side and 2+ on the left side.       Posterior tibial pulses are 2+ on the right side and 2+ on the left side.     Comments: Capillary refill < 3 sec Pulmonary:     Effort: Pulmonary effort is normal. No tachypnea, accessory muscle usage or respiratory distress.     Breath sounds: Normal breath sounds. No decreased breath sounds, wheezing, rhonchi or rales.  Abdominal:     General: Bowel sounds are normal. There is no distension.     Palpations: Abdomen is soft.     Tenderness: There is no abdominal tenderness. There is no guarding.  Musculoskeletal:        General: No tenderness. Normal range of motion.     Cervical back: Normal range of motion and neck supple.     Comments: Full ROM of all major joints No erythema, tenderness or swelling of any major joint  Lymphadenopathy:     Cervical: No cervical adenopathy.  Skin:    General: Skin is warm and dry.     Findings: No erythema.  Neurological:     Mental Status: He is alert and oriented to person, place, and time.  Psychiatric:        Behavior: Behavior normal.     ED Results / Procedures / Treatments   Labs (all labs ordered are  listed, but only abnormal results are displayed) Labs Reviewed  CBC WITH DIFFERENTIAL/PLATELET - Abnormal; Notable for the following components:      Result Value   Hemoglobin 12.5 (*)    HCT 35.3 (*)    MCV 72.5 (*)    MCH 25.7 (*)    All other components within normal limits  RETICULOCYTES - Abnormal; Notable for the following components:   Retic Ct Pct 3.5 (*)    Immature Retic Fract 33.6 (*)    All other components within normal limits  BASIC METABOLIC PANEL     Procedures Procedures   Medications Ordered in ED Medications  0.45 % sodium chloride infusion (150 mL/hr Intravenous  New Bag/Given 07/20/20 0227)  morphine 4 MG/ML injection 8 mg (8 mg Intravenous Given 07/20/20 0229)  diphenhydrAMINE (BENADRYL) capsule 25-50 mg (25 mg Oral Given 07/20/20 0229)  ketorolac (TORADOL) 15 MG/ML injection 15 mg (15 mg Intravenous Given 07/20/20 0230)  morphine 4 MG/ML injection 10 mg (10 mg Intravenous Given 07/20/20 0256)    ED Course  I have reviewed the triage vital signs and the nursing notes.  Pertinent labs & imaging results that were available during my care of the patient were reviewed by me and considered in my medical decision making (see chart for details).  Clinical Course as of 07/20/20 0515  Wynelle Link Jul 20, 2020  0513 Hemoglobin(!): 12.5 Hemoglobin above baseline today [HM]    Clinical Course User Index [HM] Dwayne Bulkley, Boyd Kerbs   MDM Rules/Calculators/A&P                           Presents for sickle cell pain crisis.  Reports pain today is similar to previous.  Denies chest pain or shortness of breath to suggest acute chest syndrome.  No injuries.  No infectious symptoms.   Labs are reassuring.  Hemoglobin slightly better than baseline.  No electrolyte disturbances.  5:14 AM Patient reports he is feeling better and would like to be discharged home.  I believe this is reasonable.  Encouraged home medication usage and discussed reasons to return to the emergency  department.  Patient states understanding and is in agreement with the plan.   Final Clinical Impression(s) / ED Diagnoses Final diagnoses:  Sickle cell pain crisis [facility])    Rx / DC Orders ED Discharge Orders    None       Zackariah Vanderpol, Boyd Kerbs 07/20/20 0515    Melene Plan, DO 07/20/20 [phone]

## 2020-08-05 ENCOUNTER — Ambulatory Visit (INDEPENDENT_AMBULATORY_CARE_PROVIDER_SITE_OTHER): Payer: Medicaid Other | Admitting: Family Medicine

## 2020-08-05 ENCOUNTER — Other Ambulatory Visit: Payer: Self-pay

## 2020-08-05 ENCOUNTER — Other Ambulatory Visit: Payer: Self-pay | Admitting: Family Medicine

## 2020-08-05 VITALS — BP 113/69 | HR 57 | Temp 97.7°F | Ht 72.0 in | Wt 175.1 lb

## 2020-08-05 DIAGNOSIS — G8929 Other chronic pain: Secondary | ICD-10-CM | POA: Diagnosis not present

## 2020-08-05 DIAGNOSIS — Z23 Encounter for immunization: Secondary | ICD-10-CM | POA: Diagnosis not present

## 2020-08-05 DIAGNOSIS — E559 Vitamin D deficiency, unspecified: Secondary | ICD-10-CM | POA: Diagnosis not present

## 2020-08-05 DIAGNOSIS — D571 Sickle-cell disease without crisis: Secondary | ICD-10-CM

## 2020-08-05 NOTE — Progress Notes (Signed)
Patient Paul Campos   Established Patient Office Visit  Subjective:  Patient ID: Paul Campos, male    DOB: 18-Feb-1991  Age: 30 y.o. MRN: [id number]  CC:  Chief Complaint  Patient presents with   Follow-up    3 month follow up; no questions or concerns.     HPI Paul Campos is a very pleasant 30 year old male with a medical history significant for sickle cell disease type SS, chronic pain syndrome, opiate dependence and tolerance, and history of anemia of chronic disease presents for follow-up of chronic conditions.  Patient says that he has been Well and is without complaint.  Generally has well-controlled sickle cell with infrequent pain crisis.  Patient is not followed by hematology at this time.  He has been taking hydroxyurea consistently over the past several years.  He has chronic pain syndrome, pain is primarily to low back.  He typically has pain daily.  Currently pain intensity is 5/10.  He last had oxycodone this a.m. with maximum relief.  He denies any headache, chest pain, shortness of breath, urinary symptoms, nausea, vomiting, or diarrhea.  Past Medical History:  Diagnosis Date   Headache    Sickle cell anemia (HCC)     Past Surgical History:  Procedure Laterality Date   APPENDECTOMY      Family History  Problem Relation Age of Onset   Lung cancer Mother    Migraines Mother     Social History   Socioeconomic History   Marital status: Single    Spouse name: Not on file   Number of children: 1   Years of education: Not on file   Highest education level: High school graduate  Occupational History    Comment: none 09/03/19  Tobacco Use   Smoking status: Never   Smokeless tobacco: Never  Vaping Use   Vaping Use: Never used  Substance and Sexual Activity   Alcohol use: No   Drug use: No   Sexual activity: Not on file  Other Topics Concern   Not on file  Social History Narrative   Caffeine- maybe coffee every  other morning   Social Determinants of Health   Financial Resource Strain: Not on file  Food Insecurity: Not on file  Transportation Needs: Not on file  Physical Activity: Not on file  Stress: Not on file  Social Connections: Not on file  Intimate Partner Violence: Not on file    Outpatient Medications Prior to Visit  Medication Sig Dispense Refill   folic acid (FOLVITE) 371 MCG tablet Take 1 tablet (800 mcg total) by mouth daily. 90 tablet 3   hydroxyurea (HYDREA) 500 MG capsule Take 1 capsule (500 mg total) by mouth daily. May take with food to minimize GI side effects. 90 capsule 2   ibuprofen (ADVIL) 800 MG tablet Take 1 tablet (800 mg total) by mouth every 8 (eight) hours as needed. (Patient taking differently: Take 800 mg by mouth every 8 (eight) hours as needed for moderate pain.) 60 tablet 1   Oxycodone HCl 20 MG TABS Take 1 tablet (20 mg total) by mouth every 4 (four) hours as needed (pain). 60 tablet 0   ergocalciferol (VITAMIN D2) 1.25 MG (50000 UT) capsule Take 1 capsule (50,000 Units total) by mouth once a week. (Patient not taking: No sig reported) 12 capsule 1   No facility-administered medications prior to visit.    Allergies  Allergen Reactions   Dilaudid [Hydromorphone Hcl] Hives   Hydromorphone  Itching    Morphine okay.   Cefotaxime Itching and Rash    Hives    ROS Review of Systems  Constitutional:  Negative for activity change and appetite change.  HENT: Negative.    Eyes: Negative.   Respiratory: Negative.    Cardiovascular: Negative.   Gastrointestinal: Negative.   Endocrine: Negative.   Genitourinary: Negative.   Musculoskeletal:  Positive for arthralgias and back pain.  Allergic/Immunologic: Negative.   Neurological: Negative.   Psychiatric/Behavioral: Negative.       Objective:    Physical Exam Eyes:     Pupils: Pupils are equal, round, and reactive to light.  Cardiovascular:     Rate and Rhythm: Normal rate and regular rhythm.      Pulses: Normal pulses.  Pulmonary:     Effort: Pulmonary effort is normal.  Abdominal:     General: Abdomen is flat. Bowel sounds are normal.  Musculoskeletal:        General: Normal range of motion.  Skin:    General: Skin is warm.  Neurological:     General: No focal deficit present.     Mental Status: He is alert. Mental status is at baseline.  Psychiatric:        Mood and Affect: Mood normal.        Behavior: Behavior normal.        Thought Content: Thought content normal.        Judgment: Judgment normal.    BP 113/69 (BP Location: Right Arm, Patient Position: Sitting)   Pulse (!) 57   Temp 97.7 F (36.5 C)   Ht 6' (1.829 m)   Wt 175 lb 0.8 oz (79.4 kg)   SpO2 100%   BMI 23.74 kg/m  Wt Readings from Last 3 Encounters:  08/05/20 175 lb 0.8 oz (79.4 kg)  07/20/20 190 lb (86.2 kg)  05/06/20 174 lb 13.2 oz (79.3 kg)     Health Maintenance Due  Topic Date Due   COVID-19 Vaccine (1) Never done   Pneumococcal Vaccine 69-63 Years old (1 - PCV) Never done   Zoster Vaccines- Shingrix (1 of 2) Never done    There are no preventive Campos reminders to display for this patient.  No results found for: TSH Lab Results  Component Value Date   WBC 8.6 07/20/2020   HGB 12.5 (L) 07/20/2020   HCT 35.3 (L) 07/20/2020   MCV 72.5 (L) 07/20/2020   PLT 179 07/20/2020   Lab Results  Component Value Date   NA 139 07/20/2020   K 3.6 07/20/2020   CO2 25 07/20/2020   GLUCOSE 83 07/20/2020   BUN 7 07/20/2020   CREATININE 0.75 07/20/2020   BILITOT 1.6 (H) 05/05/2020   ALKPHOS 60 05/05/2020   AST 39 05/05/2020   ALT 20 05/05/2020   PROT 8.1 05/05/2020   ALBUMIN 4.3 05/05/2020   CALCIUM 9.0 07/20/2020   ANIONGAP 7 07/20/2020   EGFR 124 04/29/2020   No results found for: CHOL No results found for: HDL No results found for: LDLCALC No results found for: TRIG No results found for: CHOLHDL No results found for: HGBA1C    Assessment & Plan:   Problem List Items Addressed  This Visit       Other   Hb-SS disease without crisis ([place]) - Primary   Relevant Orders   Sickle Cell Panel   Other Visit Diagnoses     Vitamin D deficiency       Relevant Orders   Sickle  Cell Panel   Other chronic pain       Relevant Orders   606004 11+Oxyco+Alc+Crt-Bund      1. Hb-SS disease without crisis ([place]) Continue Hydrea.  We discussed the need for good hydration, monitoring of hydration status, avoidance of heat, cold, stress, and infection triggers. We discussed the risks and benefits of Hydrea, including bone marrow suppression, the possibility of GI upset, skin ulcers, hair thinning, and teratogenicity. The patient was reminded of the need to seek medical attention of any symptoms of bleeding, anemia, or infection. Continue folic acid 1 mg daily to prevent aplastic bone marrow crises.   Pulmonary evaluation - Patient denies severe recurrent wheezes, shortness of breath with exercise, or persistent cough. If these symptoms develop, pulmonary function tests with spirometry will be ordered, and if abnormal, plan on referral to Pulmonology for further evaluation.  Cardiac - Routine screening for pulmonary hypertension is not recommended.  Eye - High risk of proliferative retinopathy. Annual eye exam with retinal exam recommended to patient.  Patient is not up-to-date with routine eye exams.  Immunization status -received pneumococcal vaccine without complication.  Patient otherwise up-to-date with immunizations.  He refuses COVID-19 vaccination..   Acute and chronic painful episodes -pain is well controlled on current medication regimen, no changes warranted today.   - Sickle Cell Panel  2. Vitamin D deficiency  - Sickle Cell Panel  3. Other chronic pain  - 599774 11+Oxyco+Alc+Crt-Bund  4. Need for pneumococcal vaccination  - Pneumococcal polysaccharide vaccine 23-valent greater than or equal to 2yo subcutaneous/IM    Follow-up: Return in about 3 months (around  11/05/2020).    Donia Pounds  APRN, MSN, FNP-C Patient [facility] [address] [place], [place] [address] [phone]

## 2020-08-05 NOTE — Patient Instructions (Signed)
Sickle Cell Anemia, Adult ?Sickle cell anemia is a condition where your red blood cells are shaped like sickles. Red blood cells carry oxygen through the body. Sickle-shaped cells do not live as long as normal red blood cells. They also clump together and block blood from flowing through the blood vessels. This prevents the body from getting enough oxygen. Sickle cell anemia causes organ damage and pain. It also increases the risk of infection. ?Follow these instructions at home: ?Medicines ?Take over-the-counter and prescription medicines only as told by your doctor. ?If you were prescribed an antibiotic medicine, take it as told by your doctor. Do not stop taking the antibiotic even if you start to feel better. ?If you develop a fever, do not take medicines to lower the fever right away. Tell your doctor about the fever. ?Managing pain, stiffness, and swelling ?Try these methods to help with pain: ?Use a heating pad. ?Take a warm bath. ?Distract yourself, such as by watching TV. ?Eating and drinking ?Drink enough fluid to keep your pee (urine) clear or pale yellow. Drink more in hot weather and during exercise. ?Limit or avoid alcohol. ?Eat a healthy diet. Eat plenty of fruits, vegetables, whole grains, and lean protein. ?Take vitamins and supplements as told by your doctor. ?Traveling ?When traveling, keep these with you: ?Your medical information. ?The names of your doctors. ?Your medicines. ?If you need to take an airplane, talk to your doctor first. ?Activity ?Rest often. ?Avoid exercises that make your heart beat much faster, such as jogging. ?General instructions ?Do not use products that have nicotine or tobacco, such as cigarettes and e-cigarettes. If you need help quitting, ask your doctor. ?Consider wearing a medical alert bracelet. ?Avoid being in high places (high altitudes), such as mountains. ?Avoid very hot or cold temperatures. ?Avoid places where the temperature changes a lot. ?Keep all follow-up  visits as told by your doctor. This is important. ?Contact a doctor if: ?A joint hurts. ?Your feet or hands hurt or swell. ?You feel tired (fatigued). ?Get help right away if: ?You have symptoms of infection. These include: ?Fever. ?Chills. ?Being very tired. ?Irritability. ?Poor eating. ?Throwing up (vomiting). ?You feel dizzy or faint. ?You have new stomach pain, especially on the left side. ?You have a an erection (priapism) that lasts more than 4 hours. ?You have numbness in your arms or legs. ?You have a hard time moving your arms or legs. ?You have trouble talking. ?You have pain that does not go away when you take medicine. ?You are short of breath. ?You are breathing fast. ?You have a long-term cough. ?You have pain in your chest. ?You have a bad headache. ?You have a stiff neck. ?Your stomach looks bloated even though you did not eat much. ?Your skin is pale. ?You suddenly cannot see well. ?Summary ?Sickle cell anemia is a condition where your red blood cells are shaped like sickles. ?Follow your doctor's advice on ways to manage pain, food to eat, activities to do, and steps to take for safe travel. ?Get medical help right away if you have any signs of infection, such as a fever. ?This information is not intended to replace advice given to you by your health care provider. Make sure you discuss any questions you have with your health care provider. ?Document Revised: 07/05/2019 Document Reviewed: 07/05/2019 ?Elsevier Patient Education ? 2022 [employer] ? ?

## 2020-08-06 ENCOUNTER — Other Ambulatory Visit: Payer: Self-pay | Admitting: Family Medicine

## 2020-08-06 ENCOUNTER — Other Ambulatory Visit: Payer: Self-pay

## 2020-08-06 DIAGNOSIS — D571 Sickle-cell disease without crisis: Secondary | ICD-10-CM

## 2020-08-06 DIAGNOSIS — G8929 Other chronic pain: Secondary | ICD-10-CM

## 2020-08-06 LAB — CMP14+CBC/D/PLT+FER+RETIC+V...
ALT: 11 IU/L (ref 0–44)
AST: 18 IU/L (ref 0–40)
Albumin/Globulin Ratio: 1.7 (ref 1.2–2.2)
Albumin: 4.6 g/dL (ref 4.1–5.2)
Alkaline Phosphatase: 75 IU/L (ref 44–121)
BUN/Creatinine Ratio: 4 — ABNORMAL LOW (ref 9–20)
BUN: 3 mg/dL — ABNORMAL LOW (ref 6–20)
Basophils Absolute: 0.1 10*3/uL (ref 0.0–0.2)
Basos: 1 %
Bilirubin Total: 1 mg/dL (ref 0.0–1.2)
CO2: 21 mmol/L (ref 20–29)
Calcium: 9.6 mg/dL (ref 8.7–10.2)
Chloride: 102 mmol/L (ref 96–106)
Creatinine, Ser: 0.85 mg/dL (ref 0.76–1.27)
EOS (ABSOLUTE): 0.5 10*3/uL — ABNORMAL HIGH (ref 0.0–0.4)
Eos: 6 %
Ferritin: 215 ng/mL (ref 30–400)
Globulin, Total: 2.7 g/dL (ref 1.5–4.5)
Glucose: 80 mg/dL (ref 65–99)
Hematocrit: 40.3 % (ref 37.5–51.0)
Hemoglobin: 13 g/dL (ref 13.0–17.7)
Immature Grans (Abs): 0 10*3/uL (ref 0.0–0.1)
Immature Granulocytes: 0 %
Lymphocytes Absolute: 1.5 10*3/uL (ref 0.7–3.1)
Lymphs: 22 %
MCH: 25.1 pg — ABNORMAL LOW (ref 26.6–33.0)
MCHC: 32.3 g/dL (ref 31.5–35.7)
MCV: 78 fL — ABNORMAL LOW (ref 79–97)
Monocytes Absolute: 1.1 10*3/uL — ABNORMAL HIGH (ref 0.1–0.9)
Monocytes: 16 %
Neutrophils Absolute: 3.9 10*3/uL (ref 1.4–7.0)
Neutrophils: 55 %
Platelets: 171 10*3/uL (ref 150–450)
Potassium: 4.3 mmol/L (ref 3.5–5.2)
RBC: 5.17 x10E6/uL (ref 4.14–5.80)
RDW: 17.9 % — ABNORMAL HIGH (ref 11.6–15.4)
Retic Ct Pct: 3.3 % — ABNORMAL HIGH (ref 0.6–2.6)
Sodium: 138 mmol/L (ref 134–144)
Total Protein: 7.3 g/dL (ref 6.0–8.5)
Vit D, 25-Hydroxy: 16.4 ng/mL — ABNORMAL LOW (ref 30.0–100.0)
WBC: 7.1 10*3/uL (ref 3.4–10.8)
eGFR: 121 mL/min/{1.73_m2} (ref >59)

## 2020-08-06 MED ORDER — HYDROXYUREA 500 MG PO CAPS: 500.0000 mg | ORAL_CAPSULE | Freq: Every day | ORAL | 2 refills | Status: DC

## 2020-08-06 MED ORDER — OXYCODONE HCL 20 MG PO TABS: 20.0000 mg | ORAL_TABLET | ORAL | 0 refills | Status: DC | PRN

## 2020-08-06 MED ORDER — IBUPROFEN 800 MG PO TABS: 800.0000 mg | ORAL_TABLET | Freq: Three times a day (TID) | ORAL | 1 refills | Status: DC | PRN

## 2020-08-06 MED ORDER — FOLIC ACID 800 MCG PO TABS: 800.0000 ug | ORAL_TABLET | Freq: Every day | ORAL | 3 refills | Status: DC

## 2020-08-06 NOTE — Progress Notes (Signed)
Reviewed PDMP substance reporting system prior to prescribing opiate medications. No inconsistencies noted.  Meds ordered this encounter  Medications   Oxycodone HCl 20 MG TABS    Sig: Take 1 tablet (20 mg total) by mouth every 4 (four) hours as needed (pain).    Dispense:  60 tablet    Refill:  0    Order Specific Question:   Supervising Provider    Answer:   Quentin Angst [[id number]]   hydroxyurea (HYDREA) 500 MG capsule    Sig: Take 1 capsule (500 mg total) by mouth daily. May take with food to minimize GI side effects.    Dispense:  90 capsule    Refill:  2    Order Specific Question:   Supervising Provider    Answer:   Quentin Angst [[id number]]   folic acid (FOLVITE) 800 MCG tablet    Sig: Take 1 tablet (800 mcg total) by mouth daily.    Dispense:  90 tablet    Refill:  3    Order Specific Question:   Supervising Provider    Answer:   Quentin Angst [[id number]]   ibuprofen (ADVIL) 800 MG tablet    Sig: Take 1 tablet (800 mg total) by mouth every 8 (eight) hours as needed for moderate pain.    Dispense:  30 tablet    Refill:  1    Order Specific Question:   Supervising Provider    Answer:   Quentin Angst [[id number]]      Nolon Nations  APRN, MSN, FNP-C [facility] [address] [place], [place] [address] [phone]

## 2020-08-14 ENCOUNTER — Encounter (HOSPITAL_COMMUNITY): Payer: Self-pay | Admitting: Emergency Medicine

## 2020-08-14 ENCOUNTER — Emergency Department (HOSPITAL_COMMUNITY)
Admission: EM | Admit: 2020-08-14 | Discharge: 2020-08-14 | Disposition: A | Discharge status: Home / Self Care | Payer: Medicaid Other | Attending: Emergency Medicine | Admitting: Emergency Medicine

## 2020-08-14 ENCOUNTER — Other Ambulatory Visit: Payer: Self-pay

## 2020-08-14 DIAGNOSIS — D5700 Hb-SS disease with crisis, unspecified: Secondary | ICD-10-CM

## 2020-08-14 LAB — CBC WITH DIFFERENTIAL/PLATELET
Abs Immature Granulocytes: 0.03 10*3/uL (ref 0.00–0.07)
Basophils Absolute: 0 10*3/uL (ref 0.0–0.1)
Basophils Relative: 0 %
Eosinophils Absolute: 0.6 10*3/uL — ABNORMAL HIGH (ref 0.0–0.5)
Eosinophils Relative: 5 %
HCT: 32.2 % — ABNORMAL LOW (ref 39.0–52.0)
Hemoglobin: 11.3 g/dL — ABNORMAL LOW (ref 13.0–17.0)
Immature Granulocytes: 0 %
Lymphocytes Relative: 28 %
Lymphs Abs: 3.1 10*3/uL (ref 0.7–4.0)
MCH: 25.7 pg — ABNORMAL LOW (ref 26.0–34.0)
MCHC: 35.1 g/dL (ref 30.0–36.0)
MCV: 73.2 fL — ABNORMAL LOW (ref 80.0–100.0)
Monocytes Absolute: 1.9 10*3/uL — ABNORMAL HIGH (ref 0.1–1.0)
Monocytes Relative: 18 %
Neutro Abs: 5.1 10*3/uL (ref 1.7–7.7)
Neutrophils Relative %: 49 %
Platelets: 190 10*3/uL (ref 150–400)
RBC: 4.4 MIL/uL (ref 4.22–5.81)
RDW: 15 % (ref 11.5–15.5)
WBC: 10.7 10*3/uL — ABNORMAL HIGH (ref 4.0–10.5)
nRBC: 0.5 % — ABNORMAL HIGH (ref 0.0–0.2)

## 2020-08-14 LAB — COMPREHENSIVE METABOLIC PANEL
ALT: 17 U/L (ref 0–44)
AST: 34 U/L (ref 15–41)
Albumin: 4.2 g/dL (ref 3.5–5.0)
Alkaline Phosphatase: 65 U/L (ref 38–126)
Anion gap: 7 (ref 5–15)
BUN: 8 mg/dL (ref 6–20)
CO2: 28 mmol/L (ref 22–32)
Calcium: 9.3 mg/dL (ref 8.9–10.3)
Chloride: 102 mmol/L (ref 98–111)
Creatinine, Ser: 0.75 mg/dL (ref 0.61–1.24)
GFR, Estimated: >60 mL/min (ref >60)
Glucose, Bld: 99 mg/dL (ref 70–99)
Potassium: 4 mmol/L (ref 3.5–5.1)
Sodium: 137 mmol/L (ref 135–145)
Total Bilirubin: 1 mg/dL (ref 0.3–1.2)
Total Protein: 7.4 g/dL (ref 6.5–8.1)

## 2020-08-14 LAB — DRUG SCREEN 764883 11+OXYCO+ALC+CRT-BUND
Amphetamines, Urine: NEGATIVE ng/mL
BENZODIAZ UR QL: NEGATIVE ng/mL
Barbiturate: NEGATIVE ng/mL
Cocaine (Metabolite): NEGATIVE ng/mL
Creatinine: 177.4 mg/dL (ref 20.0–300.0)
Ethanol: NEGATIVE %
Meperidine: NEGATIVE ng/mL
Methadone Screen, Urine: NEGATIVE ng/mL
OPIATE SCREEN URINE: NEGATIVE ng/mL
Phencyclidine: NEGATIVE ng/mL
Propoxyphene: NEGATIVE ng/mL
Tramadol: NEGATIVE ng/mL
pH, Urine: 6.2 (ref 4.5–8.9)

## 2020-08-14 LAB — CANNABINOID CONFIRMATION, UR
CANNABINOIDS: POSITIVE — AB
Carboxy THC GC/MS Conf: 74 ng/mL

## 2020-08-14 LAB — RETICULOCYTES
Immature Retic Fract: 42.9 % — ABNORMAL HIGH (ref 2.3–15.9)
RBC.: 4.48 MIL/uL (ref 4.22–5.81)
Retic Count, Absolute: 262.1 10*3/uL — ABNORMAL HIGH (ref 19.0–186.0)
Retic Ct Pct: 5.9 % — ABNORMAL HIGH (ref 0.4–3.1)

## 2020-08-14 LAB — OXYCODONE/OXYMORPHONE, CONFIRM
OXYCODONE/OXYMORPH: POSITIVE — AB
OXYCODONE: NEGATIVE
OXYMORPHONE (GC/MS): 676 ng/mL
OXYMORPHONE: POSITIVE — AB

## 2020-08-14 MED ORDER — MORPHINE SULFATE 30 MG PO TABS
30.0000 mg | ORAL_TABLET | ORAL | Status: DC | PRN
  Administered 2020-08-14: 30 mg via ORAL
  Filled 2020-08-14: qty 1

## 2020-08-14 MED ORDER — HYDROMORPHONE HCL 2 MG/ML IJ SOLN
2.0000 mg | INTRAMUSCULAR | Status: DC
Start: 2020-08-14 — End: 2020-08-14

## 2020-08-14 MED ORDER — MORPHINE SULFATE (PF) 10 MG/ML IV SOLN: 10.0000 mg | INTRAVENOUS | Status: DC

## 2020-08-14 MED ORDER — DIPHENHYDRAMINE HCL 50 MG/ML IJ SOLN: 25.0000 mg | Freq: Once | INTRAMUSCULAR | Status: DC

## 2020-08-14 MED ORDER — DIPHENHYDRAMINE HCL 50 MG/ML IJ SOLN
25.0000 mg | Freq: Once | INTRAMUSCULAR | Status: AC
  Administered 2020-08-14: 25 mg via INTRAVENOUS
  Filled 2020-08-14: qty 1

## 2020-08-14 MED ORDER — HYDROMORPHONE HCL 2 MG/ML IJ SOLN: 2.0000 mg | INTRAMUSCULAR | Status: DC

## 2020-08-14 MED ORDER — MORPHINE SULFATE (PF) 4 MG/ML IV SOLN
8.0000 mg | INTRAVENOUS | Status: AC
  Administered 2020-08-14: 8 mg via INTRAVENOUS
  Filled 2020-08-14: qty 2

## 2020-08-14 MED ORDER — DEXTROSE-NACL 5-0.45 % IV SOLN: INTRAVENOUS | Status: DC

## 2020-08-14 MED ORDER — MORPHINE SULFATE (PF) 4 MG/ML IV SOLN
10.0000 mg | Freq: Once | INTRAVENOUS | Status: AC
  Administered 2020-08-14: 10 mg via INTRAVENOUS
  Filled 2020-08-14: qty 3

## 2020-08-14 MED ORDER — ONDANSETRON HCL 4 MG/2ML IJ SOLN: 4.0000 mg | INTRAMUSCULAR | Status: DC | PRN

## 2020-08-14 MED ORDER — KETOROLAC TROMETHAMINE 15 MG/ML IJ SOLN
15.0000 mg | INTRAMUSCULAR | Status: AC
  Administered 2020-08-14: 15 mg via INTRAVENOUS
  Filled 2020-08-14: qty 1

## 2020-08-14 MED ORDER — ACETAMINOPHEN 500 MG PO TABS
1000.0000 mg | ORAL_TABLET | Freq: Once | ORAL | Status: AC
  Administered 2020-08-14: 1000 mg via ORAL
  Filled 2020-08-14: qty 2

## 2020-08-14 NOTE — ED Provider Notes (Signed)
[facility]-EMERGENCY DEPT Provider Note   CSN: [id number] Arrival date & time: 08/14/20  0142     History Chief Complaint  Patient presents with   Sickle Cell Pain Crisis    Paul Campos is a 30 y.o. male with a hx of sickle cell anemia and chronic pain syndrome who presents to the emergency department with complaints of sickle cell pain that began 2 days ago.  Patient states the pain is fairly generalized but most prominent in his upper extremities, feels similar to prior sickle cell pain, tried his at home pain medication without relief. Denies fever, chest pain, dyspnea, vomiting or syncope. No recent injury.    HPI     Past Medical History:  Diagnosis Date   Headache    Sickle cell anemia (HCC)     Patient Active Problem List   Diagnosis Date Noted   Leukocytosis 05/07/2020   Chronic pain syndrome 05/07/2020   Acute sickle cell crisis (HCC) 05/06/2020   Nausea & vomiting 11/05/2018   Sickle cell anemia with crisis (HCC) 11/04/2018   Hb-SS disease without crisis (HCC) 01/02/2018   Hypokalemia 08/21/2012   Thrombocytopenia, unspecified (HCC) 08/19/2012   CAP (community acquired pneumonia) 08/18/2012   Sickle cell pain crisis (HCC) 05/06/2012   Chest pain 05/06/2012    Past Surgical History:  Procedure Laterality Date   APPENDECTOMY         Family History  Problem Relation Age of Onset   Lung cancer Mother    Migraines Mother     Social History   Tobacco Use   Smoking status: Never   Smokeless tobacco: Never  Vaping Use   Vaping Use: Never used  Substance Use Topics   Alcohol use: No   Drug use: No    Home Medications Prior to Admission medications   Medication Sig Start Date End Date Taking? Authorizing Provider  folic acid (FOLVITE) 800 MCG tablet Take 1 tablet (800 mcg total) by mouth daily. 08/06/20   Massie Maroon, FNP  hydroxyurea (HYDREA) 500 MG capsule Take 1 capsule (500 mg total) by mouth daily. May take with  food to minimize GI side effects. 08/06/20   Massie Maroon, FNP  ibuprofen (ADVIL) 800 MG tablet Take 1 tablet (800 mg total) by mouth every 8 (eight) hours as needed for moderate pain. 08/06/20   Massie Maroon, FNP  Oxycodone HCl 20 MG TABS Take 1 tablet (20 mg total) by mouth every 4 (four) hours as needed (pain). 08/06/20   Massie Maroon, FNP    Allergies    Dilaudid [hydromorphone hcl], Hydromorphone, and Cefotaxime  Review of Systems   Review of Systems  Constitutional:  Negative for chills and fever.  Respiratory:  Negative for cough and shortness of breath.   Cardiovascular:  Negative for chest pain.  Gastrointestinal:  Negative for nausea and vomiting.  Genitourinary:  Negative for dysuria.  Musculoskeletal:  Positive for myalgias.  Skin:  Negative for rash.  Neurological:  Negative for syncope.  All other systems reviewed and are negative.  Physical Exam Updated Vital Signs BP 127/82   Pulse 77   Temp 98.4 F (36.9 C) (Oral)   Resp 18   SpO2 99%   Physical Exam Vitals and nursing note reviewed.  Constitutional:      General: He is not in acute distress.    Appearance: He is well-developed. He is not toxic-appearing.  HENT:     Head: Normocephalic and atraumatic.  Eyes:  General:        Right eye: No discharge.        Left eye: No discharge.     Conjunctiva/sclera: Conjunctivae normal.  Cardiovascular:     Rate and Rhythm: Normal rate and regular rhythm.     Comments: 2+ symmetric radial pulses.  Pulmonary:     Effort: Pulmonary effort is normal. No respiratory distress.     Breath sounds: Normal breath sounds. No wheezing, rhonchi or rales.  Abdominal:     General: There is no distension.     Palpations: Abdomen is soft.     Tenderness: There is no abdominal tenderness.  Musculoskeletal:     Cervical back: Neck supple.     Comments: Upper extremities: No obvious deformities or significant open wounds. Able to actively range all major joints.  No focal tenderness. Compartments are soft.   Skin:    General: Skin is warm and dry.     Findings: No rash.  Neurological:     General: No focal deficit present.     Mental Status: He is alert.     Comments: Clear speech.   Psychiatric:        Behavior: Behavior normal.    ED Results / Procedures / Treatments   Labs (all labs ordered are listed, but only abnormal results are displayed) Labs Reviewed  CBC WITH DIFFERENTIAL/PLATELET - Abnormal; Notable for the following components:      Result Value   WBC 10.7 (*)    Hemoglobin 11.3 (*)    HCT 32.2 (*)    MCV 73.2 (*)    MCH 25.7 (*)    nRBC 0.5 (*)    Monocytes Absolute 1.9 (*)    Eosinophils Absolute 0.6 (*)    All other components within normal limits  RETICULOCYTES - Abnormal; Notable for the following components:   Retic Ct Pct 5.9 (*)    Retic Count, Absolute 262.1 (*)    Immature Retic Fract 42.9 (*)    All other components within normal limits  COMPREHENSIVE METABOLIC PANEL    EKG None  Radiology No results found.  Procedures Procedures   Medications Ordered in ED Medications  ondansetron (ZOFRAN) injection 4 mg (has no administration in time range)  morphine 4 MG/ML injection 8 mg (has no administration in time range)  ketorolac (TORADOL) 15 MG/ML injection 15 mg (15 mg Intravenous Given 08/14/20 0625)  diphenhydrAMINE (BENADRYL) injection 25 mg (25 mg Intravenous Given 08/14/20 0624)  morphine 4 MG/ML injection 10 mg (10 mg Intravenous Given 08/14/20 1610)    ED Course  I have reviewed the triage vital signs and the nursing notes.  Pertinent labs & imaging results that were available during my care of the patient were reviewed by me and considered in my medical decision making (see chart for details).    MDM Rules/Calculators/A&P                         Patient presents to the ED with complaints of sickle cell pain, generalized, most prominently in the upper extremities.  Nontoxic, vitals without  significant abnormality.  No signs of infection on exam.  Moving all joints.  Compartments are soft.  Neurovascular intact distally.  No chest pain, dyspnea, cough, or fevers to raise concern for acute chest syndrome.  Plan for sickle cell pain protocol initiation and reevaluation.  Additional history obtained:  Additional history obtained from chart review & nursing note review.   Lab  Tests:  I Ordered, reviewed, and interpreted labs, which included:  CBC: Mild anemia worsened from prior.  CMP: unremarkable.  Reticulocytes: Mildly elevated.   06:30: Patient care signed out to [facility] PA-C at change of shift pending re-evaluation & disposition.   Portions of this note were generated with Scientist, clinical (histocompatibility and immunogenetics). Dictation errors may occur despite best attempts at proofreading.  Final Clinical Impression(s) / ED Diagnoses Final diagnoses:  None    Rx / DC Orders ED Discharge Orders     None        Cherly Anderson, PA-C 08/14/20 0644    Paula Libra, MD 08/14/20 [phone]

## 2020-08-14 NOTE — ED Provider Notes (Signed)
Accepted handoff at shift change from Sam P. PA-C. Please see prior provider note for more detail.   Briefly: Patient is 30 y.o.   Plan: Follow-up on labs and reevaluate patient's pain.     Physical Exam  BP 113/67   Pulse 67   Temp 98.4 F (36.9 C) (Oral)   Resp 13   SpO2 97%   Physical Exam  ED Course/Procedures   Clinical Course as of 08/14/20 1023  Thu Aug 14, 2020  0840 Discussed with [place] Hollis plan is to hydrate here encourage around-the-clock Tylenol and ibuprofen patient will be able to follow-up tomorrow sickle cell clinic and will increase home narcotic pain medicine to every 4 hours. [WF]    Clinical Course User Index [WF] Gailen Shelter, [place]    Procedures Results for orders placed or performed during the hospital encounter of 08/14/20  Comprehensive metabolic panel  Result Value Ref Range   Sodium 137 135 - 145 mmol/L   Potassium 4.0 3.5 - 5.1 mmol/L   Chloride 102 98 - 111 mmol/L   CO2 28 22 - 32 mmol/L   Glucose, Bld 99 70 - 99 mg/dL   BUN 8 6 - 20 mg/dL   Creatinine, Ser 3.01 0.61 - 1.24 mg/dL   Calcium 9.3 8.9 - 60.1 mg/dL   Total Protein 7.4 6.5 - 8.1 g/dL   Albumin 4.2 3.5 - 5.0 g/dL   AST 34 15 - 41 U/L   ALT 17 0 - 44 U/L   Alkaline Phosphatase 65 38 - 126 U/L   Total Bilirubin 1.0 0.3 - 1.2 mg/dL   GFR, Estimated >09 >32 mL/min   Anion gap 7 5 - 15  CBC with Differential  Result Value Ref Range   WBC 10.7 (H) 4.0 - 10.5 K/uL   RBC 4.40 4.22 - 5.81 MIL/uL   Hemoglobin 11.3 (L) 13.0 - 17.0 g/dL   HCT 35.5 (L) 73.2 - 20.2 %   MCV 73.2 (L) 80.0 - 100.0 fL   MCH 25.7 (L) 26.0 - 34.0 pg   MCHC 35.1 30.0 - 36.0 g/dL   RDW 54.2 70.6 - 23.7 %   Platelets 190 150 - 400 K/uL   nRBC 0.5 (H) 0.0 - 0.2 %   Neutrophils Relative % 49 %   Neutro Abs 5.1 1.7 - 7.7 K/uL   Lymphocytes Relative 28 %   Lymphs Abs 3.1 0.7 - 4.0 K/uL   Monocytes Relative 18 %   Monocytes Absolute 1.9 (H) 0.1 - 1.0 K/uL   Eosinophils Relative 5 %   Eosinophils  Absolute 0.6 (H) 0.0 - 0.5 K/uL   Basophils Relative 0 %   Basophils Absolute 0.0 0.0 - 0.1 K/uL   Immature Granulocytes 0 %   Abs Immature Granulocytes 0.03 0.00 - 0.07 K/uL   Polychromasia PRESENT    Sickle Cells PRESENT    Target Cells PRESENT   Reticulocytes  Result Value Ref Range   Retic Ct Pct 5.9 (H) 0.4 - 3.1 %   RBC. 4.48 4.22 - 5.81 MIL/uL   Retic Count, Absolute 262.1 (H) 19.0 - 186.0 K/uL   Immature Retic Fract 42.9 (H) 2.3 - 15.9 %   No results found.  MDM   Patient has 60% improvement in pain from initial presentation.  Discussed with [place] Hollis who made recommendations.  Patient is agreeable to plan to discharge will take home pain medications every 4 hours instead of every 6 hours.  [place] states that she is aware of  this depleting his narcotic pain medication sooner and we will adjust his prescriptions.  Patient is tolerating p.o. has received IV hydration here in ER.  Ambulatory and feels much improved.  Will discharge home at this time.  Does have mild decrease in hemoglobin and increased reticulocyte consistent with hemolytic crisis does not experiencing any symptoms of anemia and not significantly decreased.  Suspect dehydration and overheating and then cold air conditioning being the cause of patient's flare.       Gailen Shelter, [place] 08/14/20 1024    Koleen Distance, MD 08/14/20 [phone]

## 2020-08-14 NOTE — ED Triage Notes (Signed)
Pt arriving with sickle cell pain crisis. Pt reports this began  a couple days ago and he was able to manage temporarily at home with his meds but the pain has gotten too bad. Pain is mostly in both arms at this time.

## 2020-08-14 NOTE — Discharge Instructions (Addendum)
Take your home meds every 4 hours for the next 24Hr.  I talked to [place] Hollis who is aware of this plan.   Please take Tylenol and ibuprofen in addition to your home medications for the next 24 hours around-the-clock as discussed below.  Please take 600 mg of ibuprofen every 6 hours and 1000 mg of Tylenol every 6 hours therefore we will be alternating between the 2 and taking a dose of 1 or the other every 3 hours.

## 2020-08-14 NOTE — ED Notes (Signed)
RN had 2 unsuccessful PIV attempts

## 2020-08-16 ENCOUNTER — Encounter: Payer: Self-pay | Admitting: Family Medicine

## 2020-08-18 ENCOUNTER — Encounter (HOSPITAL_COMMUNITY): Payer: Self-pay

## 2020-08-18 ENCOUNTER — Other Ambulatory Visit: Payer: Self-pay

## 2020-08-18 ENCOUNTER — Observation Stay (HOSPITAL_COMMUNITY)
Admission: EM | Admit: 2020-08-18 | Discharge: 2020-08-20 | Disposition: A | Discharge status: Home / Self Care | Payer: Medicaid Other | Attending: Internal Medicine | Admitting: Internal Medicine

## 2020-08-18 DIAGNOSIS — D72829 Elevated white blood cell count, unspecified: Secondary | ICD-10-CM | POA: Diagnosis not present

## 2020-08-18 DIAGNOSIS — Z20822 Contact with and (suspected) exposure to covid-19: Secondary | ICD-10-CM | POA: Diagnosis not present

## 2020-08-18 DIAGNOSIS — G894 Chronic pain syndrome: Secondary | ICD-10-CM | POA: Diagnosis present

## 2020-08-18 DIAGNOSIS — D571 Sickle-cell disease without crisis: Secondary | ICD-10-CM

## 2020-08-18 DIAGNOSIS — D5700 Hb-SS disease with crisis, unspecified: Secondary | ICD-10-CM | POA: Diagnosis not present

## 2020-08-18 DIAGNOSIS — G8929 Other chronic pain: Secondary | ICD-10-CM

## 2020-08-18 DIAGNOSIS — M79662 Pain in left lower leg: Secondary | ICD-10-CM

## 2020-08-18 DIAGNOSIS — M79605 Pain in left leg: Secondary | ICD-10-CM | POA: Diagnosis present

## 2020-08-18 LAB — CBC WITH DIFFERENTIAL/PLATELET
Abs Immature Granulocytes: 0.04 10*3/uL (ref 0.00–0.07)
Basophils Absolute: 0 10*3/uL (ref 0.0–0.1)
Basophils Relative: 0 %
Eosinophils Absolute: 0.5 10*3/uL (ref 0.0–0.5)
Eosinophils Relative: 4 %
HCT: 31 % — ABNORMAL LOW (ref 39.0–52.0)
Hemoglobin: 10.7 g/dL — ABNORMAL LOW (ref 13.0–17.0)
Immature Granulocytes: 0 %
Lymphocytes Relative: 35 %
Lymphs Abs: 4.4 10*3/uL — ABNORMAL HIGH (ref 0.7–4.0)
MCH: 25.4 pg — ABNORMAL LOW (ref 26.0–34.0)
MCHC: 34.5 g/dL (ref 30.0–36.0)
MCV: 73.5 fL — ABNORMAL LOW (ref 80.0–100.0)
Monocytes Absolute: 2.1 10*3/uL — ABNORMAL HIGH (ref 0.1–1.0)
Monocytes Relative: 17 %
Neutro Abs: 5.4 10*3/uL (ref 1.7–7.7)
Neutrophils Relative %: 44 %
Platelets: 186 10*3/uL (ref 150–400)
RBC: 4.22 MIL/uL (ref 4.22–5.81)
RDW: 15.1 % (ref 11.5–15.5)
WBC: 12.5 10*3/uL — ABNORMAL HIGH (ref 4.0–10.5)
nRBC: 0.5 % — ABNORMAL HIGH (ref 0.0–0.2)

## 2020-08-18 LAB — RETICULOCYTES
Immature Retic Fract: 38 % — ABNORMAL HIGH (ref 2.3–15.9)
RBC.: 4.35 MIL/uL (ref 4.22–5.81)
Retic Count, Absolute: 225.3 10*3/uL — ABNORMAL HIGH (ref 19.0–186.0)
Retic Ct Pct: 5.2 % — ABNORMAL HIGH (ref 0.4–3.1)

## 2020-08-18 LAB — COMPREHENSIVE METABOLIC PANEL
ALT: 29 U/L (ref 0–44)
AST: 47 U/L — ABNORMAL HIGH (ref 15–41)
Albumin: 4 g/dL (ref 3.5–5.0)
Alkaline Phosphatase: 59 U/L (ref 38–126)
Anion gap: 7 (ref 5–15)
BUN: 7 mg/dL (ref 6–20)
CO2: 25 mmol/L (ref 22–32)
Calcium: 8.8 mg/dL — ABNORMAL LOW (ref 8.9–10.3)
Chloride: 103 mmol/L (ref 98–111)
Creatinine, Ser: 0.75 mg/dL (ref 0.61–1.24)
GFR, Estimated: >60 mL/min (ref >60)
Glucose, Bld: 113 mg/dL — ABNORMAL HIGH (ref 70–99)
Potassium: 3.6 mmol/L (ref 3.5–5.1)
Sodium: 135 mmol/L (ref 135–145)
Total Bilirubin: 1 mg/dL (ref 0.3–1.2)
Total Protein: 7.4 g/dL (ref 6.5–8.1)

## 2020-08-18 NOTE — ED Triage Notes (Signed)
Pt reports sickle cell pain in left leg.

## 2020-08-18 NOTE — ED Provider Notes (Signed)
Emergency Medicine Provider Triage Evaluation Note  Paul Campos , a 30 y.o. male  was evaluated in triage.  Pt complains of L leg pain due to sicle cell pain crisis.   Pt states pain started yesterday. C/w with typical SCC.   Review of Systems  Positive: Leg pain Negative: numbness  Physical Exam  BP 116/83   Pulse 80   Temp 99.6 F (37.6 C) (Oral)   Resp 16   SpO2 96%  Gen:   Awake, no distress   Resp:  Normal effort  MSK:   Moves extremities without difficulty. Ttp of the L leg   Medical Decision Making  Medically screening exam initiated at 11:04 PM.  Appropriate orders placed.  Paul Campos was informed that the remainder of the evaluation will be completed by another provider, this initial triage assessment does not replace that evaluation, and the importance of remaining in the ED until their evaluation is complete.  SCC labs ordered   Paul Apley, PA-C 08/18/20 2305    Tilden Fossa, MD 08/19/20 [phone]

## 2020-08-19 ENCOUNTER — Inpatient Hospital Stay (HOSPITAL_BASED_OUTPATIENT_CLINIC_OR_DEPARTMENT_OTHER): Payer: Medicaid Other

## 2020-08-19 DIAGNOSIS — D5700 Hb-SS disease with crisis, unspecified: Secondary | ICD-10-CM | POA: Diagnosis not present

## 2020-08-19 DIAGNOSIS — M79605 Pain in left leg: Secondary | ICD-10-CM | POA: Diagnosis not present

## 2020-08-19 LAB — RESP PANEL BY RT-PCR (FLU A&B, COVID) ARPGX2
Influenza A by PCR: NEGATIVE
Influenza B by PCR: NEGATIVE
SARS Coronavirus 2 by RT PCR: NEGATIVE

## 2020-08-19 MED ORDER — ONDANSETRON HCL 4 MG/2ML IJ SOLN
4.0000 mg | INTRAMUSCULAR | Status: DC | PRN
  Administered 2020-08-19: 4 mg via INTRAVENOUS
  Filled 2020-08-19: qty 2

## 2020-08-19 MED ORDER — DIPHENHYDRAMINE HCL 25 MG PO CAPS
25.0000 mg | ORAL_CAPSULE | ORAL | Status: DC | PRN
  Administered 2020-08-19: 25 mg via ORAL
  Filled 2020-08-19: qty 1

## 2020-08-19 MED ORDER — MORPHINE SULFATE (PF) 4 MG/ML IV SOLN
8.0000 mg | Freq: Once | INTRAVENOUS | Status: AC
  Administered 2020-08-19: 8 mg via INTRAVENOUS
  Filled 2020-08-19: qty 2

## 2020-08-19 MED ORDER — SODIUM CHLORIDE 0.45 % IV SOLN: INTRAVENOUS | Status: DC

## 2020-08-19 MED ORDER — SODIUM CHLORIDE 0.9% FLUSH: 9.0000 mL | INTRAVENOUS | Status: DC | PRN

## 2020-08-19 MED ORDER — ENOXAPARIN SODIUM 80 MG/0.8ML IJ SOSY
1.0000 mg/kg | PREFILLED_SYRINGE | Freq: Once | INTRAMUSCULAR | Status: AC
  Administered 2020-08-19: 80 mg via SUBCUTANEOUS
  Filled 2020-08-19: qty 0.8

## 2020-08-19 MED ORDER — NALOXONE HCL 0.4 MG/ML IJ SOLN: 0.4000 mg | INTRAMUSCULAR | Status: DC | PRN

## 2020-08-19 MED ORDER — MORPHINE SULFATE 1 MG/ML IV SOLN PCA
INTRAVENOUS | Status: DC
  Administered 2020-08-19: 8 mg via INTRAVENOUS
  Administered 2020-08-19: 6 mg via INTRAVENOUS
  Administered 2020-08-19: 1 mg via INTRAVENOUS
  Administered 2020-08-20: 22 mg via INTRAVENOUS
  Administered 2020-08-20: 30 mg via INTRAVENOUS
  Administered 2020-08-20: 4 mg via INTRAVENOUS
  Filled 2020-08-19 (×3): qty 30

## 2020-08-19 MED ORDER — MORPHINE SULFATE 1 MG/ML IV SOLN PCA
INTRAVENOUS | Status: DC
  Filled 2020-08-19: qty 30

## 2020-08-19 MED ORDER — KETOROLAC TROMETHAMINE 15 MG/ML IJ SOLN
15.0000 mg | INTRAMUSCULAR | Status: AC
  Administered 2020-08-19: 15 mg via INTRAVENOUS
  Filled 2020-08-19: qty 1

## 2020-08-19 MED ORDER — ONDANSETRON HCL 4 MG/2ML IJ SOLN: 4.0000 mg | Freq: Four times a day (QID) | INTRAMUSCULAR | Status: DC | PRN

## 2020-08-19 MED ORDER — HYDROMORPHONE HCL 1 MG/ML IJ SOLN
1.0000 mg | Freq: Once | INTRAMUSCULAR | Status: AC
  Administered 2020-08-19: 1 mg via INTRAVENOUS
  Filled 2020-08-19: qty 1

## 2020-08-19 MED ORDER — DIPHENHYDRAMINE HCL 25 MG PO CAPS: 25.0000 mg | ORAL_CAPSULE | ORAL | Status: DC | PRN

## 2020-08-19 MED ORDER — SODIUM CHLORIDE 0.9 % IV SOLN
25.0000 mg | INTRAVENOUS | Status: DC | PRN
  Filled 2020-08-19: qty 0.5

## 2020-08-19 MED ORDER — KETOROLAC TROMETHAMINE 30 MG/ML IJ SOLN
15.0000 mg | Freq: Four times a day (QID) | INTRAMUSCULAR | Status: DC
  Administered 2020-08-19 – 2020-08-20 (×4): 15 mg via INTRAVENOUS
  Filled 2020-08-19 (×4): qty 1

## 2020-08-19 MED ORDER — METHOCARBAMOL 500 MG PO TABS
1000.0000 mg | ORAL_TABLET | Freq: Once | ORAL | Status: AC
  Administered 2020-08-19: 1000 mg via ORAL
  Filled 2020-08-19: qty 2

## 2020-08-19 MED ORDER — HYDROMORPHONE HCL 2 MG/ML IJ SOLN
2.0000 mg | Freq: Once | INTRAMUSCULAR | Status: AC
  Administered 2020-08-19: 2 mg via INTRAVENOUS
  Filled 2020-08-19: qty 1

## 2020-08-19 NOTE — Progress Notes (Signed)
Wasted 1 ml of Morphine PCA pump 1mg /83mL; with 0m.

## 2020-08-19 NOTE — Progress Notes (Signed)
Left lower extremity venous duplex has been completed. Preliminary results can be found in CV Proc through chart review.   08/19/20 9:25 AM Olen Cordial RVT

## 2020-08-19 NOTE — H&P (Signed)
H&P  Patient Demographics:  Paul Campos, is a 30 y.o. male  MRN: [id number]   DOB - 05/18/1990  Admit Date - 08/18/2020  Outpatient Primary MD for the patient is Massie Maroon, FNP  Chief Complaint  Patient presents with   Sickle Cell Pain Crisis      HPI:   Paul Campos  is a 30 y.o. male is a 30 year old male with a medical history significant for sickle cell disease, chronic pain syndrome, chronic marijuana use, and opiate dependence and tolerance presented to the emergency department with complaints of left lower extremity pain since yesterday that has been unrelieved by home medications.  Patient has not identified any provocative factors pertaining to current crisis.  He says that he has been taking his home oxycodone consistently, however the pain has been "unbearable".  He says that pain intensity is worse with weightbearing.  He rates pain as 8/10 characterized as constant and throbbing.  He denies any recent injury.  Patient also denies swelling.  No fever, chills, chest pain, urinary symptoms, nausea, vomiting, or diarrhea.  No sick contacts, recent travel, or exposure to COVID-19.  ER course: Vital signs show BP 112/78, pulse 57, temperature 99.6, respirations 16, and oxygen saturation 99% on RA.  WBCs 12.5, hemoglobin 10.7.  Complete metabolic panel unremarkable.  COVID-19 negative.  Patient's pain persists despite IV Dilaudid, IV fluids, and IV Toradol.  Admitted to MedSurg for further management of sickle cell pain crisis.     Review of systems:  Review of Systems  Constitutional:  Negative for chills and fever.  HENT: Negative.    Eyes: Negative.   Respiratory:  Negative for shortness of breath.   Cardiovascular:  Negative for chest pain.  Gastrointestinal: Negative.   Genitourinary: Negative.   Musculoskeletal:  Positive for joint pain (Left leg pain).  Skin: Negative.   Neurological: Negative.  Negative for seizures.  Psychiatric/Behavioral: Negative.      With Past History of the following :   Past Medical History:  Diagnosis Date   Headache    Sickle cell anemia (HCC)       Past Surgical History:  Procedure Laterality Date   APPENDECTOMY       Social History:   Social History   Tobacco Use   Smoking status: Never   Smokeless tobacco: Never  Substance Use Topics   Alcohol use: No     Lives - At home   Family History :   Family History  Problem Relation Age of Onset   Lung cancer Mother    Migraines Mother      Home Medications:   Prior to Admission medications   Medication Sig Start Date End Date Taking? Authorizing Provider  folic acid (FOLVITE) 800 MCG tablet Take 1 tablet (800 mcg total) by mouth daily. 08/06/20  Yes Massie Maroon, FNP  hydroxyurea (HYDREA) 500 MG capsule Take 1 capsule (500 mg total) by mouth daily. May take with food to minimize GI side effects. 08/06/20  Yes Massie Maroon, FNP  ibuprofen (ADVIL) 800 MG tablet Take 1 tablet (800 mg total) by mouth every 8 (eight) hours as needed for moderate pain. 08/06/20  Yes Massie Maroon, FNP  Oxycodone HCl 20 MG TABS Take 1 tablet (20 mg total) by mouth every 4 (four) hours as needed (pain). 08/06/20  Yes Massie Maroon, FNP     Allergies:   Allergies  Allergen Reactions   Dilaudid [Hydromorphone Hcl] Hives   Hydromorphone Itching  Morphine okay.   Cefotaxime Itching and Rash    Hives     Physical Exam:   Vitals:   Vitals:   08/19/20 0630 08/19/20 0722  BP: 111/77 112/78  Pulse: (!) 49 (!) 57  Resp:  16  Temp:    SpO2: 99% 99%    Physical Exam: Constitutional: Patient appears well-developed and well-nourished. Not in obvious distress. HENT: Normocephalic, atraumatic, External right and left ear normal. Oropharynx is clear and moist.  Eyes: Conjunctivae and EOM are normal. PERRLA, no scleral icterus. Neck: Normal ROM. Neck supple. No JVD. No tracheal deviation. No thyromegaly. CVS: RRR, S1/S2 +, no murmurs, no gallops,  no carotid bruit.  Pulmonary: Effort and breath sounds normal, no stridor, rhonchi, wheezes, rales.  Abdominal: Soft. BS +, no distension, tenderness, rebound or guarding.  Musculoskeletal: Normal range of motion. No edema and no tenderness.  Lymphadenopathy: No lymphadenopathy noted, cervical, inguinal or axillary Neuro: Alert. Normal reflexes, muscle tone coordination. No cranial nerve deficit. Skin: Skin is warm and dry. No rash noted. Not diaphoretic. No erythema. No pallor. Psychiatric: Normal mood and affect. Behavior, judgment, thought content normal.   Data Review:   CBC Recent Labs  Lab 08/14/20 0418 08/18/20 2320  WBC 10.7* 12.5*  HGB 11.3* 10.7*  HCT 32.2* 31.0*  PLT 190 186  MCV 73.2* 73.5*  MCH 25.7* 25.4*  MCHC 35.1 34.5  RDW 15.0 15.1  LYMPHSABS 3.1 4.4*  MONOABS 1.9* 2.1*  EOSABS 0.6* 0.5  BASOSABS 0.0 0.0   ------------------------------------------------------------------------------------------------------------------  Chemistries  Recent Labs  Lab 08/14/20 0418 08/18/20 2320  NA 137 135  K 4.0 3.6  CL 102 103  CO2 28 25  GLUCOSE 99 113*  BUN 8 7  CREATININE 0.75 0.75  CALCIUM 9.3 8.8*  AST 34 47*  ALT 17 29  ALKPHOS 65 59  BILITOT 1.0 1.0   ------------------------------------------------------------------------------------------------------------------ estimated creatinine clearance is 150.9 mL/min (by C-G formula based on SCr of 0.75 mg/dL). ------------------------------------------------------------------------------------------------------------------ No results for input(s): TSH, T4TOTAL, T3FREE, THYROIDAB in the last 72 hours.  Invalid input(s): FREET3  Coagulation profile No results for input(s): INR, PROTIME in the last 168 hours. ------------------------------------------------------------------------------------------------------------------- No results for input(s): DDIMER in the last 72  hours. -------------------------------------------------------------------------------------------------------------------  Cardiac Enzymes No results for input(s): CKMB, TROPONINI, MYOGLOBIN in the last 168 hours.  Invalid input(s): CK ------------------------------------------------------------------------------------------------------------------ No results found for: BNP  ---------------------------------------------------------------------------------------------------------------  Urinalysis    Component Value Date/Time   COLORURINE AMBER (A) 08/19/2012 1024   APPEARANCEUR Clear 12/02/2017 0952   LABSPEC 1.020 08/19/2012 1024   PHURINE 6.5 08/19/2012 1024   GLUCOSEU Negative 12/02/2017 0952   HGBUR NEGATIVE 08/19/2012 1024   BILIRUBINUR neg 01/15/2020 0923   BILIRUBINUR Negative 12/02/2017 0952   KETONESUR negative 04/26/2018 0941   KETONESUR NEGATIVE 08/19/2012 1024   PROTEINUR Negative 01/15/2020 0923   PROTEINUR Negative 12/02/2017 0952   PROTEINUR NEGATIVE 08/19/2012 1024   UROBILINOGEN 0.2 01/15/2020 0923   UROBILINOGEN 0.2 08/19/2012 1024   NITRITE neg 01/15/2020 0923   NITRITE Negative 12/02/2017 0952   NITRITE NEGATIVE 08/19/2012 1024   LEUKOCYTESUR Negative 01/15/2020 0923   LEUKOCYTESUR 2+ (A) 12/02/2017 0952    ----------------------------------------------------------------------------------------------------------------   Imaging Results:    No results found.   Assessment & Plan:  Principal Problem:   Sickle cell pain crisis (HCC) Active Problems:   Chronic pain syndrome  Sickle cell disease with pain crisis: Admit, initiate IV fluids, 0.45% saline at 150 mL/h IV morphine PCA, weight-based protocol Toradol 15 mg  IV every 6 hours Hold oxycodone, use PCA Monitor vital signs very closely, reevaluate pain scale regularly, and supplemental oxygen as needed  Anemia of chronic disease: Hemoglobin 10.7, consistent with patient's baseline.  No  clinical indication for blood transfusion at this time.  Continue to follow closely.  Also, continue hydroxyurea and folic acid  Leukocytosis: Mild leukocytosis noted.  Patient afebrile without any signs of infection.  COVID-19 negative.  More than likely related to vaso-occlusive crisis.  Continue to follow closely without antibiotics.  Labs in AM.  Chronic pain syndrome: Hold oxycodone, use PCA.  We will transition to oral opiates as pain intensity improves    DVT Prophylaxis: Subcut Lovenox   AM Labs Ordered, also please review Full Orders  Family Communication: Admission, patient's condition and plan of care including tests being ordered have been discussed with the patient who indicate understanding and agree with the plan and Code Status.  Code Status: Full Code  Consults called: None    Admission status: Inpatient    Time spent in minutes : 35 minutes  Nolon Nations  APRN, MSN, FNP-C Patient Care [facility] [address] [place], [place] [address] [phone]  08/19/2020 at 8:46 AM

## 2020-08-19 NOTE — ED Provider Notes (Signed)
[facility]-EMERGENCY DEPT Provider Note   CSN: [id number] Arrival date & time: 08/18/20  2203     History Chief Complaint  Patient presents with   Sickle Cell Pain Crisis    Paul Campos is a 30 y.o. male with a history of sickle cell anemia, chronic pain syndrome who presents the emergency department with a chief complaint of left leg pain.  The patient reports constant, throbbing pain in the left lower leg that began yesterday.  Pain has been worsening since onset.  Yesterday, pain was controlled with his home pain medications, but tonight despite taking his home pain medications as prescribed, pain was unbearable.  Pain is worse with weightbearing.  No other known aggravating or alleviating factors.  He does report a history of similar pain in his arms and legs with previous sickle cell pain crisis, but states that it is typically in both legs.  However, he does feel as if he is currently having a sickle cell pain crisis.  He denies left knee or ankle pain, numbness, weakness, redness, warmth, fever, chills, chest pain, shortness of breath, cough, leg swelling.  No history of VTE.  He is not anticoagulated.  The history is provided by the patient and medical records. No language interpreter was used.      Past Medical History:  Diagnosis Date   Headache    Sickle cell anemia (HCC)     Patient Active Problem List   Diagnosis Date Noted   Leukocytosis 05/07/2020   Chronic pain syndrome 05/07/2020   Acute sickle cell crisis (HCC) 05/06/2020   Nausea & vomiting 11/05/2018   Sickle cell anemia with crisis (HCC) 11/04/2018   Hb-SS disease without crisis (HCC) 01/02/2018   Hypokalemia 08/21/2012   Thrombocytopenia, unspecified (HCC) 08/19/2012   CAP (community acquired pneumonia) 08/18/2012   Sickle cell pain crisis (HCC) 05/06/2012   Chest pain 05/06/2012    Past Surgical History:  Procedure Laterality Date   APPENDECTOMY         Family History   Problem Relation Age of Onset   Lung cancer Mother    Migraines Mother     Social History   Tobacco Use   Smoking status: Never   Smokeless tobacco: Never  Vaping Use   Vaping Use: Never used  Substance Use Topics   Alcohol use: No   Drug use: No    Home Medications Prior to Admission medications   Medication Sig Start Date End Date Taking? Authorizing Provider  folic acid (FOLVITE) 800 MCG tablet Take 1 tablet (800 mcg total) by mouth daily. 08/06/20   Massie Maroon, FNP  hydroxyurea (HYDREA) 500 MG capsule Take 1 capsule (500 mg total) by mouth daily. May take with food to minimize GI side effects. 08/06/20   Massie Maroon, FNP  ibuprofen (ADVIL) 800 MG tablet Take 1 tablet (800 mg total) by mouth every 8 (eight) hours as needed for moderate pain. 08/06/20   Massie Maroon, FNP  Oxycodone HCl 20 MG TABS Take 1 tablet (20 mg total) by mouth every 4 (four) hours as needed (pain). 08/06/20   Massie Maroon, FNP    Allergies    Dilaudid [hydromorphone hcl], Hydromorphone, and Cefotaxime  Review of Systems   Review of Systems  Constitutional:  Negative for appetite change and fever.  HENT:  Negative for congestion and sore throat.   Respiratory:  Negative for cough, shortness of breath and wheezing.   Cardiovascular:  Negative for chest pain  and palpitations.  Gastrointestinal:  Negative for abdominal pain, constipation, diarrhea, nausea and vomiting.  Genitourinary:  Negative for dysuria.  Musculoskeletal:  Positive for arthralgias and myalgias. Negative for back pain, gait problem, neck pain and neck stiffness.  Skin:  Negative for color change, rash and wound.  Allergic/Immunologic: Negative for immunocompromised state.  Neurological:  Negative for seizures, syncope, weakness, numbness and headaches.  Psychiatric/Behavioral:  Negative for confusion.    Physical Exam Updated Vital Signs BP 103/77 (BP Location: Right Arm)   Pulse 62   Temp 99.6 F (37.6 C)  (Oral)   Resp 17   SpO2 100%   Physical Exam Vitals and nursing note reviewed.  Constitutional:      Appearance: He is well-developed.  HENT:     Head: Normocephalic.  Eyes:     Conjunctiva/sclera: Conjunctivae normal.  Cardiovascular:     Rate and Rhythm: Normal rate and regular rhythm.     Heart sounds: No murmur heard. Pulmonary:     Effort: Pulmonary effort is normal. No respiratory distress.     Breath sounds: No stridor. No wheezing, rhonchi or rales.  Chest:     Chest wall: No tenderness.  Abdominal:     General: There is no distension.     Palpations: Abdomen is soft.  Musculoskeletal:     Cervical back: Neck supple.     Comments: Diffusely tender to palpation of the left lower extremity.  There is diffuse tenderness palpation along the tibia, but there is also significant tenderness to palpation to the calf.  No focal tenderness to palpation to the left knee or ankle.  Full active and passive range of motion of the left ankle, knee, hip.  Good capillary refill.  Moves all digits independently of the bilateral feet.  5 of 5 strength against resistance with dorsiflexion plantarflexion.  DP and PT pulses are 2+ and symmetric.  No erythema, warmth.  Muscular compartments are soft.  Skin:    General: Skin is warm and dry.  Neurological:     Mental Status: He is alert.  Psychiatric:        Behavior: Behavior normal.    ED Results / Procedures / Treatments   Labs (all labs ordered are listed, but only abnormal results are displayed) Labs Reviewed  COMPREHENSIVE METABOLIC PANEL - Abnormal; Notable for the following components:      Result Value   Glucose, Bld 113 (*)    Calcium 8.8 (*)    AST 47 (*)    All other components within normal limits  CBC WITH DIFFERENTIAL/PLATELET - Abnormal; Notable for the following components:   WBC 12.5 (*)    Hemoglobin 10.7 (*)    HCT 31.0 (*)    MCV 73.5 (*)    MCH 25.4 (*)    nRBC 0.5 (*)    Lymphs Abs 4.4 (*)    Monocytes  Absolute 2.1 (*)    All other components within normal limits  RETICULOCYTES - Abnormal; Notable for the following components:   Retic Ct Pct 5.2 (*)    Retic Count, Absolute 225.3 (*)    Immature Retic Fract 38.0 (*)    All other components within normal limits  RESP PANEL BY RT-PCR (FLU A&B, COVID) [id number]    EKG None  Radiology No results found.  Procedures Procedures   Medications Ordered in ED Medications  ondansetron (ZOFRAN) injection 4 mg (4 mg Intravenous Given 08/19/20 0119)  0.45 % sodium chloride infusion ( Intravenous New Bag/Given 08/19/20  0119)  enoxaparin (LOVENOX) injection 80 mg (has no administration in time range)  ketorolac (TORADOL) 15 MG/ML injection 15 mg (15 mg Intravenous Given 08/19/20 0120)  morphine 4 MG/ML injection 8 mg (8 mg Intravenous Given 08/19/20 0120)  HYDROmorphone (DILAUDID) injection 1 mg (1 mg Intravenous Given 08/19/20 0302)  HYDROmorphone (DILAUDID) injection 2 mg (2 mg Intravenous Given 08/19/20 0431)  methocarbamol (ROBAXIN) tablet 1,000 mg (1,000 mg Oral Given 08/19/20 0430)    ED Course  I have reviewed the triage vital signs and the nursing notes.  Pertinent labs & imaging results that were available during my care of the patient were reviewed by me and considered in my medical decision making (see chart for details).    MDM Rules/Calculators/A&P                          30 year old male with a history of sickle cell anemia, chronic pain syndrome who presents to the emergency department with a chief complaint of left lower leg pain.  Vital signs are stable.  Neurovascular intact on exam.  He has diffuse tenderness palpation to the left lower leg.  No swelling, erythema, warmth.  Right lower leg is on remarkable.  Muscular compartments are soft.  No chest pain or shortness of breath, concerning for PE or acute chest.  However, there is concern for DVT.  Labs have been reviewed and independently interpreted by me.  Leukocytosis of  12.5.  No other infectious symptoms.  Hemoglobin is 10.7.  No metabolic derangements.   The patient was given multiple doses of pain medication with no improvement in his symptoms.  He will require admission for pain control due to sickle cell pain crisis.  Given concern for localized pain in the left lower leg, there is also concern for DVT given his history of sickle cell disease.  We will give Lovenox and a venous duplex study of the left lower extremity has been ordered.  Consult to the hospitalist service and Dr. Julian Reil will accept the patient for admission. The patient appears reasonably stabilized for admission considering the current resources, flow, and capabilities available in the ED at this time, and I doubt any other [facility] requiring further screening and/or treatment in the ED prior to admission.   Final Clinical Impression(s) / ED Diagnoses Final diagnoses:  Sickle cell pain crisis (HCC)  Pain in left lower leg    Rx / DC Orders ED Discharge Orders     None        Barkley Boards, PA-C 08/19/20 0520    Zadie Rhine, MD 08/19/20 [phone]

## 2020-08-19 NOTE — Plan of Care (Signed)

## 2020-08-20 DIAGNOSIS — D5700 Hb-SS disease with crisis, unspecified: Secondary | ICD-10-CM | POA: Diagnosis not present

## 2020-08-20 MED ORDER — OXYCODONE HCL 20 MG PO TABS: 20.0000 mg | ORAL_TABLET | Freq: Four times a day (QID) | ORAL | 0 refills | Status: DC | PRN

## 2020-08-20 NOTE — Progress Notes (Signed)
Morphine PCA discontinued at this time d/t patient being discharged to home. 39ml wasted per this nurse, Yvonna Alanis, RN witnessed.

## 2020-08-20 NOTE — Plan of Care (Signed)
Pt ambulates in hallway and in his room; now resting comfortably in bed; no s/s of acute distress or pain reported or observed; pain well controlled with PCA morphine. Call light and cell phone within reach and bed at lowest position for safety.

## 2020-08-21 NOTE — Discharge Summary (Signed)
Physician Discharge Summary  Paul Campos RAX:[id number] DOB: 16-Jan-1991 DOA: 08/18/2020  PCP: Massie Maroon, FNP  Admit date: 08/19/2020  Discharge date: 08/20/2020  Discharge Diagnoses:  Principal Problem:   Sickle cell pain crisis (HCC) Active Problems:   Chronic pain syndrome   Discharge Condition: Stable  Disposition:  Pt is discharged home in good condition and is to follow up with Massie Maroon, FNP this week to have labs evaluated. Paul Campos is instructed to increase activity slowly and balance with rest for the next few days, and use prescribed medication to complete treatment of pain  Diet: Regular Wt Readings from Last 3 Encounters:  08/19/20 79.4 kg  08/14/20 79.4 kg  08/05/20 79.4 kg    History of present illness:  Paul Campos  is a 30 y.o. male is a 30 year old male with a medical history significant for sickle cell disease, chronic pain syndrome, chronic marijuana use, and opiate dependence and tolerance presented to the emergency department with complaints of left lower extremity pain since yesterday that has been unrelieved by home medications.  Patient has not identified any provocative factors pertaining to current crisis.  He says that he has been taking his home oxycodone consistently, however the pain has been "unbearable".  He says that pain intensity is worse with weightbearing.  He rates pain as 8/10 characterized as constant and throbbing.  He denies any recent injury.  Patient also denies swelling.  No fever, chills, chest pain, urinary symptoms, nausea, vomiting, or diarrhea.  No sick contacts, recent travel, or exposure to COVID-19.   ER course: Vital signs show BP 112/78, pulse 57, temperature 99.6, respirations 16, and oxygen saturation 99% on RA.  WBCs 12.5, hemoglobin 10.7.  Complete metabolic panel unremarkable.  COVID-19 negative.  Patient's pain persists despite IV Dilaudid, IV fluids, and IV Toradol.  Admitted to MedSurg for further  management of sickle cell pain crisis.  Hospital Course:  Sickle cell disease with pain crisis: Patient was admitted for sickle cell pain crisis and managed appropriately with IVF, IV Morphine via PCA and IV Toradol, as well as other adjunct therapies per sickle cell pain management protocols.  Pain intensity decreased overnight.  Patient transition to home medications.  He will continue oxycodone 20 mg every 6 hours as needed.  Medication sent to pharmacy #[id number].  Reviewed PDMP prior to prescribing opiate medications, no inconsistencies noted.  Patient says that his pain intensity has decreased to 3/10, which is around his baseline.  Patient is alert, oriented, and ambulating without assistance.  He will discharge hemodynamically stable condition  Patient was therefore discharged home today in a hemodynamically stable condition.   Paul Campos will follow-up with PCP within 1 week of this discharge. Paul Campos was counseled extensively about nonpharmacologic means of pain management, patient verbalized understanding and was appreciative of  the care received during this admission.   We discussed the need for good hydration, monitoring of hydration status, avoidance of heat, cold, stress, and infection triggers. We discussed the need to be adherent with taking Hydrea and other home medications. Patient was reminded of the need to seek medical attention immediately if any symptom of bleeding, anemia, or infection occurs.  Discharge Exam: Vitals:   08/20/20 0941 08/20/20 1037  BP:  116/72  Pulse:  70  Resp: 12 14  Temp:  98.2 F (36.8 C)  SpO2: 98% 99%   Vitals:   08/20/20 0737 08/20/20 0937 08/20/20 0941 08/20/20 1037  BP:    116/72  Pulse:  70  Resp: 14 12 12 14   Temp:    98.2 F (36.8 C)  TempSrc:    Oral  SpO2: 99% 98% 98% 99%  Weight:      Height:        General appearance : Awake, alert, not in any distress. Speech Clear. Not toxic looking HEENT: Atraumatic and Normocephalic, pupils  equally reactive to light and accomodation Neck: Supple, no JVD. No cervical lymphadenopathy.  Chest: Good air entry bilaterally, no added sounds  CVS: S1 S2 regular, no murmurs.  Abdomen: Bowel sounds present, Non tender and not distended with no gaurding, rigidity or rebound. Extremities: B/L Lower Ext shows no edema, both legs are warm to touch Neurology: Awake alert, and oriented X 3, CN II-XII intact, Non focal Skin: No Rash  Discharge Instructions   Allergies as of 08/20/2020       Reactions   Dilaudid [hydromorphone Hcl] Hives   Hydromorphone Itching   Morphine okay.   Cefotaxime Itching, Rash   Hives        Medication List     TAKE these medications    folic acid 800 MCG tablet Commonly known as: FOLVITE Take 1 tablet (800 mcg total) by mouth daily.   hydroxyurea 500 MG capsule Commonly known as: HYDREA Take 1 capsule (500 mg total) by mouth daily. May take with food to minimize GI side effects.   ibuprofen 800 MG tablet Commonly known as: ADVIL Take 1 tablet (800 mg total) by mouth every 8 (eight) hours as needed for moderate pain.   Oxycodone HCl 20 MG Tabs Take 1 tablet (20 mg total) by mouth every 6 (six) hours as needed (pain). What changed: when to take this        The results of significant diagnostics from this hospitalization (including imaging, microbiology, ancillary and laboratory) are listed below for reference.    Significant Diagnostic Studies: VAS 08/22/2020 LOWER EXTREMITY VENOUS (DVT)  Result Date: 08/19/2020  Lower Venous DVT Study Patient Name:  Paul Campos  Date of Exam:   08/19/2020 Medical Rec #: 08/21/2020     Accession #:    [id number] Date of Birth: 1991/01/13    Patient Gender: M Patient Age:   29Y Exam Location:  [facility] Procedure:      [facility] LOWER EXTREMITY VENOUS (DVT) Referring Phys: [place] MIA A MCDONALD --------------------------------------------------------------------------------  Indications: Pain.  Risk Factors:  None identified. Comparison Study: No prior studies. Performing Technologist: [id number]  Examination Guidelines: A complete evaluation includes B-mode imaging, spectral Doppler, color Doppler, and power Doppler as needed of all accessible portions of each vessel. Bilateral testing is considered an integral part of a complete examination. Limited examinations for reoccurring indications may be performed as noted. The reflux portion of the exam is performed with the patient in reverse Trendelenburg.  +-----+---------------+---------+-----------+----------+--------------+ RIGHTCompressibilityPhasicitySpontaneityPropertiesThrombus Aging +-----+---------------+---------+-----------+----------+--------------+ CFV  Full           Yes      Yes                                 +-----+---------------+---------+-----------+----------+--------------+   +---------+---------------+---------+-----------+----------+--------------+ LEFT     CompressibilityPhasicitySpontaneityPropertiesThrombus Aging +---------+---------------+---------+-----------+----------+--------------+ CFV      Full           Yes      Yes                                 +---------+---------------+---------+-----------+----------+--------------+  SFJ      Full                                                        +---------+---------------+---------+-----------+----------+--------------+ FV Prox  Full                                                        +---------+---------------+---------+-----------+----------+--------------+ FV Mid   Full                                                        +---------+---------------+---------+-----------+----------+--------------+ FV DistalFull                                                        +---------+---------------+---------+-----------+----------+--------------+ PFV      Full                                                         +---------+---------------+---------+-----------+----------+--------------+ POP      Full           Yes      Yes                                 +---------+---------------+---------+-----------+----------+--------------+ PTV      Full                                                        +---------+---------------+---------+-----------+----------+--------------+ PERO     Full                                                        +---------+---------------+---------+-----------+----------+--------------+     Summary: RIGHT: - No evidence of common femoral vein obstruction.  LEFT: - There is no evidence of deep vein thrombosis in the lower extremity.  - No cystic structure found in the popliteal fossa.  *See table(s) above for measurements and observations. Electronically signed by Waverly Ferrarihristopher Dickson MD on 08/19/2020 at 12:20:19 PM.    Final     Microbiology: Recent Results (from the past 240 hour(s))  Resp Panel by RT-PCR (Flu A&B, Covid) Nasopharyngeal Swab     Status: None   Collection Time: 08/19/20  5:49 AM   Specimen: Nasopharyngeal Swab; Nasopharyngeal(NP) swabs in vial transport medium  Result Value  Ref Range Status   SARS Coronavirus 2 by RT PCR NEGATIVE NEGATIVE Final    Comment: (NOTE) SARS-CoV-2 target nucleic acids are NOT DETECTED.  The SARS-CoV-2 RNA is generally detectable in upper respiratory specimens during the acute phase of infection. The lowest concentration of SARS-CoV-2 viral copies this assay can detect is 138 copies/mL. A negative result does not preclude SARS-Cov-2 infection and should not be used as the sole basis for treatment or other patient management decisions. A negative result may occur with  improper specimen collection/handling, submission of specimen other than nasopharyngeal swab, presence of viral mutation(s) within the areas targeted by this assay, and inadequate number of viral copies(<138 copies/mL). A negative result must be  combined with clinical observations, patient history, and epidemiological information. The expected result is Negative.  Fact Sheet for Patients:  BloggerCourse.com  Fact Sheet for Healthcare Providers:  SeriousBroker.it  This test is no t yet approved or cleared by the [place] FDA and  has been authorized for detection and/or diagnosis of SARS-CoV-2 by FDA under an Emergency Use Authorization (EUA). This EUA will remain  in effect (meaning this test can be used) for the duration of the COVID-19 declaration under Section 564(b)(1) of the Act, 21 U.S.C.section 360bbb-3(b)(1), unless the authorization is terminated  or revoked sooner.       Influenza A by PCR NEGATIVE NEGATIVE Final   Influenza B by PCR NEGATIVE NEGATIVE Final    Comment: (NOTE) The Xpert Xpress SARS-CoV-2/FLU/RSV plus assay is intended as an aid in the diagnosis of influenza from Nasopharyngeal swab specimens and should not be used as a sole basis for treatment. Nasal washings and aspirates are unacceptable for Xpert Xpress SARS-CoV-2/FLU/RSV testing.  Fact Sheet for Patients: BloggerCourse.com  Fact Sheet for Healthcare Providers: SeriousBroker.it  This test is not yet approved or cleared by the [place] FDA and has been authorized for detection and/or diagnosis of SARS-CoV-2 by FDA under an Emergency Use Authorization (EUA). This EUA will remain in effect (meaning this test can be used) for the duration of the COVID-19 declaration under Section 564(b)(1) of the Act, 21 U.S.C. section 360bbb-3(b)(1), unless the authorization is terminated or revoked.  Performed at [facility], [address], [place], [place] [address]      Labs: Basic Metabolic Panel: Recent Labs  Lab 08/18/20 2320  NA 135  K 3.6  CL 103  CO2 25  GLUCOSE 113*  BUN 7  CREATININE 0.75  CALCIUM 8.8*    Liver Function Tests: Recent Labs  Lab 08/18/20 2320  AST 47*  ALT 29  ALKPHOS 59  BILITOT 1.0  PROT 7.4  ALBUMIN 4.0   No results for input(s): LIPASE, AMYLASE in the last 168 hours. No results for input(s): AMMONIA in the last 168 hours. CBC: Recent Labs  Lab 08/18/20 2320  WBC 12.5*  NEUTROABS 5.4  HGB 10.7*  HCT 31.0*  MCV 73.5*  PLT 186   Cardiac Enzymes: No results for input(s): CKTOTAL, CKMB, CKMBINDEX, TROPONINI in the last 168 hours. BNP: Invalid input(s): POCBNP CBG: No results for input(s): GLUCAP in the last 168 hours.  Time coordinating discharge: 35 minutes  Signed:  Nolon Nations  APRN, MSN, FNP-C Patient Care [facility] [address] [place], [place] [address] [phone]  [facility] 08/21/2020, 5:45 PM

## 2020-09-04 ENCOUNTER — Other Ambulatory Visit: Payer: Self-pay | Admitting: Family Medicine

## 2020-09-04 ENCOUNTER — Other Ambulatory Visit: Payer: Self-pay

## 2020-09-04 DIAGNOSIS — D571 Sickle-cell disease without crisis: Secondary | ICD-10-CM

## 2020-09-04 DIAGNOSIS — G8929 Other chronic pain: Secondary | ICD-10-CM

## 2020-09-04 MED ORDER — OXYCODONE HCL 20 MG PO TABS: 20.0000 mg | ORAL_TABLET | Freq: Four times a day (QID) | ORAL | 0 refills | Status: DC | PRN

## 2020-09-04 NOTE — Progress Notes (Signed)
Reviewed PDMP substance reporting system prior to prescribing opiate medications. No inconsistencies noted.  Meds ordered this encounter  Medications   Oxycodone HCl 20 MG TABS    Sig: Take 1 tablet (20 mg total) by mouth every 6 (six) hours as needed (pain).    Dispense:  60 tablet    Refill:  0    Order Specific Question:   Supervising Provider    Answer:   [name], [name] [[id number]]   Valerio Pinard [name] Lexx Monte  APRN, MSN, FNP-C [facility] [address]  [place], [place] [address] [phone]  

## 2020-09-23 ENCOUNTER — Other Ambulatory Visit: Payer: Self-pay

## 2020-09-23 DIAGNOSIS — D571 Sickle-cell disease without crisis: Secondary | ICD-10-CM

## 2020-09-23 DIAGNOSIS — G8929 Other chronic pain: Secondary | ICD-10-CM

## 2020-09-24 ENCOUNTER — Other Ambulatory Visit: Payer: Self-pay

## 2020-09-24 DIAGNOSIS — G8929 Other chronic pain: Secondary | ICD-10-CM

## 2020-09-24 DIAGNOSIS — D571 Sickle-cell disease without crisis: Secondary | ICD-10-CM

## 2020-09-25 ENCOUNTER — Other Ambulatory Visit: Payer: Self-pay | Admitting: Family Medicine

## 2020-09-25 DIAGNOSIS — D571 Sickle-cell disease without crisis: Secondary | ICD-10-CM

## 2020-09-25 DIAGNOSIS — G8929 Other chronic pain: Secondary | ICD-10-CM

## 2020-09-25 MED ORDER — OXYCODONE HCL 20 MG PO TABS: 20.0000 mg | ORAL_TABLET | Freq: Four times a day (QID) | ORAL | 0 refills | Status: DC | PRN

## 2020-09-25 NOTE — Progress Notes (Signed)
Reviewed PDMP substance reporting system prior to prescribing opiate medications. No inconsistencies noted.  Meds ordered this encounter  Medications   Oxycodone HCl 20 MG TABS    Sig: Take 1 tablet (20 mg total) by mouth every 6 (six) hours as needed (pain).    Dispense:  60 tablet    Refill:  0    Order Specific Question:   Supervising Provider    Answer:   [name], [name] [[id number]]   Paul Campos Paul Vice  APRN, MSN, FNP-C [facility] [address]  [place], [place] [address] [phone]  

## 2020-10-13 ENCOUNTER — Other Ambulatory Visit: Payer: Self-pay

## 2020-10-13 DIAGNOSIS — G8929 Other chronic pain: Secondary | ICD-10-CM

## 2020-10-13 DIAGNOSIS — D571 Sickle-cell disease without crisis: Secondary | ICD-10-CM

## 2020-10-14 ENCOUNTER — Other Ambulatory Visit: Payer: Self-pay

## 2020-10-14 DIAGNOSIS — D571 Sickle-cell disease without crisis: Secondary | ICD-10-CM

## 2020-10-14 DIAGNOSIS — G8929 Other chronic pain: Secondary | ICD-10-CM

## 2020-10-15 MED ORDER — OXYCODONE HCL 20 MG PO TABS: 20.0000 mg | ORAL_TABLET | Freq: Four times a day (QID) | ORAL | 0 refills | Status: DC | PRN

## 2020-10-17 ENCOUNTER — Telehealth: Payer: Self-pay

## 2020-10-17 NOTE — Telephone Encounter (Signed)
Oxycodone prior auth initiated via covermymeds Key: [facility] - PA Case ID: [id number] - Rx #: [id number]

## 2020-10-20 DIAGNOSIS — R778 Other specified abnormalities of plasma proteins: Secondary | ICD-10-CM | POA: Insufficient documentation

## 2020-10-20 NOTE — Telephone Encounter (Signed)
Approvedon August 26 PA Case: [id number], Status: Approved, Coverage Starts on: 10/17/2020 12:00:00 AM, Coverage Ends on: 04/15/2021 12:00:00 AM.

## 2020-10-31 ENCOUNTER — Other Ambulatory Visit: Payer: Self-pay

## 2020-10-31 ENCOUNTER — Other Ambulatory Visit: Payer: Self-pay | Admitting: Family Medicine

## 2020-10-31 DIAGNOSIS — D571 Sickle-cell disease without crisis: Secondary | ICD-10-CM

## 2020-10-31 DIAGNOSIS — G8929 Other chronic pain: Secondary | ICD-10-CM

## 2020-10-31 MED ORDER — OXYCODONE HCL 20 MG PO TABS: 20.0000 mg | ORAL_TABLET | Freq: Four times a day (QID) | ORAL | 0 refills | Status: DC | PRN

## 2020-10-31 NOTE — Progress Notes (Signed)
Reviewed PDMP substance reporting system prior to prescribing opiate medications. No inconsistencies noted.  Meds ordered this encounter  Medications   Oxycodone HCl 20 MG TABS    Sig: Take 1 tablet (20 mg total) by mouth every 6 (six) hours as needed (pain).    Dispense:  60 tablet    Refill:  0    Order Specific Question:   Supervising Provider    Answer:   [name], [name] [[id number]]   Alva Kuenzel [name] Janautica Netzley  APRN, MSN, FNP-C [facility] [address]  [place], [place] [address] [phone]  

## 2020-11-11 ENCOUNTER — Ambulatory Visit: Payer: Medicaid Other | Admitting: Family Medicine

## 2020-11-12 ENCOUNTER — Other Ambulatory Visit: Payer: Self-pay

## 2020-11-12 ENCOUNTER — Other Ambulatory Visit: Payer: Self-pay | Admitting: Family Medicine

## 2020-11-12 DIAGNOSIS — D571 Sickle-cell disease without crisis: Secondary | ICD-10-CM

## 2020-11-12 DIAGNOSIS — G8929 Other chronic pain: Secondary | ICD-10-CM

## 2020-11-12 MED ORDER — OXYCODONE HCL 20 MG PO TABS: 20.0000 mg | ORAL_TABLET | Freq: Four times a day (QID) | ORAL | 0 refills | Status: DC | PRN

## 2020-11-12 NOTE — Progress Notes (Signed)
Reviewed PDMP substance reporting system prior to prescribing opiate medications. No inconsistencies noted.  Meds ordered this encounter  Medications   Oxycodone HCl 20 MG TABS    Sig: Take 1 tablet (20 mg total) by mouth every 6 (six) hours as needed (pain).    Dispense:  60 tablet    Refill:  0    Order Specific Question:   Supervising Provider    Answer:   [name], [name] [[id number]]   Monterrius Cardosa [name] Santita Hunsberger  APRN, MSN, FNP-C [facility] [address]  [place], [place] [address] [phone]  

## 2020-11-13 ENCOUNTER — Encounter: Payer: Self-pay | Admitting: Family Medicine

## 2020-11-13 ENCOUNTER — Ambulatory Visit (INDEPENDENT_AMBULATORY_CARE_PROVIDER_SITE_OTHER): Payer: Medicaid Other | Admitting: Family Medicine

## 2020-11-13 ENCOUNTER — Other Ambulatory Visit: Payer: Self-pay

## 2020-11-13 VITALS — BP 122/81 | HR 62 | Temp 98.7°F | Ht 72.0 in | Wt 175.4 lb

## 2020-11-13 DIAGNOSIS — D571 Sickle-cell disease without crisis: Secondary | ICD-10-CM | POA: Diagnosis not present

## 2020-11-13 DIAGNOSIS — G8929 Other chronic pain: Secondary | ICD-10-CM

## 2020-11-13 DIAGNOSIS — R82998 Other abnormal findings in urine: Secondary | ICD-10-CM

## 2020-11-13 DIAGNOSIS — Z23 Encounter for immunization: Secondary | ICD-10-CM | POA: Diagnosis not present

## 2020-11-13 DIAGNOSIS — E559 Vitamin D deficiency, unspecified: Secondary | ICD-10-CM

## 2020-11-13 LAB — POCT URINALYSIS DIP (CLINITEK)
Bilirubin, UA: NEGATIVE
Blood, UA: NEGATIVE
Glucose, UA: NEGATIVE mg/dL
Ketones, POC UA: NEGATIVE mg/dL
Nitrite, UA: NEGATIVE
POC PROTEIN,UA: NEGATIVE
Spec Grav, UA: 1.02 (ref 1.010–1.025)
Urobilinogen, UA: 0.2 E.U./dL
pH, UA: 5.5 (ref 5.0–8.0)

## 2020-11-13 MED ORDER — HYDROXYUREA 500 MG PO CAPS: 500.0000 mg | ORAL_CAPSULE | Freq: Two times a day (BID) | ORAL | 2 refills | Status: DC

## 2020-11-13 NOTE — Patient Instructions (Signed)
Sickle Cell Anemia, Adult ?Sickle cell anemia is a condition where your red blood cells are shaped like sickles. Red blood cells carry oxygen through the body. Sickle-shaped cells do not live as long as normal red blood cells. They also clump together and block blood from flowing through the blood vessels. This prevents the body from getting enough oxygen. Sickle cell anemia causes organ damage and pain. It also increases the risk of infection. ?Follow these instructions at home: ?Medicines ?Take over-the-counter and prescription medicines only as told by your doctor. ?If you were prescribed an antibiotic medicine, take it as told by your doctor. Do not stop taking the antibiotic even if you start to feel better. ?If you develop a fever, do not take medicines to lower the fever right away. Tell your doctor about the fever. ?Managing pain, stiffness, and swelling ?Try these methods to help with pain: ?Use a heating pad. ?Take a warm bath. ?Distract yourself, such as by watching TV. ?Eating and drinking ?Drink enough fluid to keep your pee (urine) clear or pale yellow. Drink more in hot weather and during exercise. ?Limit or avoid alcohol. ?Eat a healthy diet. Eat plenty of fruits, vegetables, whole grains, and lean protein. ?Take vitamins and supplements as told by your doctor. ?Traveling ?When traveling, keep these with you: ?Your medical information. ?The names of your doctors. ?Your medicines. ?If you need to take an airplane, talk to your doctor first. ?Activity ?Rest often. ?Avoid exercises that make your heart beat much faster, such as jogging. ?General instructions ?Do not use products that have nicotine or tobacco, such as cigarettes and e-cigarettes. If you need help quitting, ask your doctor. ?Consider wearing a medical alert bracelet. ?Avoid being in high places (high altitudes), such as mountains. ?Avoid very hot or cold temperatures. ?Avoid places where the temperature changes a lot. ?Keep all follow-up  visits as told by your doctor. This is important. ?Contact a doctor if: ?A joint hurts. ?Your feet or hands hurt or swell. ?You feel tired (fatigued). ?Get help right away if: ?You have symptoms of infection. These include: ?Fever. ?Chills. ?Being very tired. ?Irritability. ?Poor eating. ?Throwing up (vomiting). ?You feel dizzy or faint. ?You have new stomach pain, especially on the left side. ?You have a an erection (priapism) that lasts more than 4 hours. ?You have numbness in your arms or legs. ?You have a hard time moving your arms or legs. ?You have trouble talking. ?You have pain that does not go away when you take medicine. ?You are short of breath. ?You are breathing fast. ?You have a long-term cough. ?You have pain in your chest. ?You have a bad headache. ?You have a stiff neck. ?Your stomach looks bloated even though you did not eat much. ?Your skin is pale. ?You suddenly cannot see well. ?Summary ?Sickle cell anemia is a condition where your red blood cells are shaped like sickles. ?Follow your doctor's advice on ways to manage pain, food to eat, activities to do, and steps to take for safe travel. ?Get medical help right away if you have any signs of infection, such as a fever. ?This information is not intended to replace advice given to you by your health care provider. Make sure you discuss any questions you have with your health care provider. ?Document Revised: 07/05/2019 Document Reviewed: 07/05/2019 ?Elsevier Patient Education ? 2022 [employer] ? ?

## 2020-11-13 NOTE — Progress Notes (Signed)
[facility]   Established Patient Office Visit  Subjective:  Patient ID: Paul Campos, male    DOB: 12/26/1990  Age: 29 y.o. MRN: [id number]  CC:  Chief Complaint  Patient presents with   Follow-up    Pt is here today for his sickle cell anemia follow up visit. Pt has no concerns or issues to discuss.    HPI Paul Campos is a very pleasant 29-year-old male with a medical history significant for sickle cell disease, chronic pain syndrome, opiate dependence and tolerance, and history of anemia of chronic disease presents for follow-up.  Patient says that he is doing well and is without complaint on today.  Patient was recently discharged from inpatient services at [facility] for sickle cell crisis.  He states that crisis resolved during admission.  Patient is having pain primarily to low back and lower extremities today.  His pain intensity is 5/10 characterized as intermittent and aching.  He last had oxycodone this a.m. with moderate relief.  He denies any headache, chest pain, dizziness, urinary symptoms, nausea, vomiting, or diarrhea.  Past Medical History:  Diagnosis Date   Headache    Sickle cell anemia (HCC)     Past Surgical History:  Procedure Laterality Date   APPENDECTOMY      Family History  Problem Relation Age of Onset   Lung cancer Mother    Migraines Mother     Social History   Socioeconomic History   Marital status: Single    Spouse name: Not on file   Number of children: 1   Years of education: Not on file   Highest education level: High school graduate  Occupational History    Comment: none 09/03/19  Tobacco Use   Smoking status: Never   Smokeless tobacco: Never  Vaping Use   Vaping Use: Never used  Substance and Sexual Activity   Alcohol use: No   Drug use: No   Sexual activity: Not on file  Other Topics Concern   Not on file  Social History Narrative   Caffeine- maybe coffee every other morning    Social Determinants of Health   Financial Resource Strain: Not on file  Food Insecurity: Not on file  Transportation Needs: Not on file  Physical Activity: Not on file  Stress: Not on file  Social Connections: Not on file  Intimate Partner Violence: Not on file    Outpatient Medications Prior to Visit  Medication Sig Dispense Refill   folic acid (FOLVITE) 800 MCG tablet Take 1 tablet (800 mcg total) by mouth daily. 90 tablet 3   hydroxyurea (HYDREA) 500 MG capsule Take 1 capsule (500 mg total) by mouth daily. May take with food to minimize GI side effects. 90 capsule 2   ibuprofen (ADVIL) 800 MG tablet Take 1 tablet (800 mg total) by mouth every 8 (eight) hours as needed for moderate pain. 30 tablet 1   [START ON 11/15/2020] Oxycodone HCl 20 MG TABS Take 1 tablet (20 mg total) by mouth every 6 (six) hours as needed (pain). 60 tablet 0   No facility-administered medications prior to visit.    Allergies  Allergen Reactions   Dilaudid [Hydromorphone Hcl] Hives   Hydromorphone Itching    Morphine okay.   Cefotaxime Itching and Rash    Hives    ROS Review of Systems  Constitutional: Negative.   HENT: Negative.    Eyes: Negative.   Respiratory: Negative.    Gastrointestinal: Negative.     Genitourinary: Negative.   Musculoskeletal:  Positive for arthralgias and back pain.  Skin: Negative.   Neurological: Negative.   Psychiatric/Behavioral: Negative.       Objective:    Physical Exam Constitutional:      Appearance: Normal appearance.  HENT:     Mouth/Throat:     Pharynx: Oropharynx is clear.  Eyes:     Pupils: Pupils are equal, round, and reactive to light.  Cardiovascular:     Rate and Rhythm: Normal rate and regular rhythm.     Pulses: Normal pulses.     Heart sounds: Normal heart sounds.  Pulmonary:     Effort: Pulmonary effort is normal.  Abdominal:     General: Abdomen is flat. Bowel sounds are normal.  Skin:    General: Skin is warm.  Neurological:      General: No focal deficit present.     Mental Status: He is alert. Mental status is at baseline.  Psychiatric:        Mood and Affect: Mood normal.        Behavior: Behavior normal.        Thought Content: Thought content normal.        Judgment: Judgment normal.    BP 122/81   Pulse 62   Temp 98.7 F (37.1 C)   Ht 6' (1.829 m)   Wt 175 lb 6.4 oz (79.6 kg)   SpO2 100%   BMI 23.79 kg/m  Wt Readings from Last 3 Encounters:  11/13/20 175 lb 6.4 oz (79.6 kg)  08/19/20 175 lb (79.4 kg)  08/14/20 175 lb (79.4 kg)    Health Maintenance Due  Topic Date Due   COVID-19 Vaccine (1) Never done   INFLUENZA VACCINE  09/22/2020    There are no preventive care reminders to display for this patient.  No results found for: TSH Lab Results  Component Value Date   WBC 12.5 (H) 08/18/2020   HGB 10.7 (L) 08/18/2020   HCT 31.0 (L) 08/18/2020   MCV 73.5 (L) 08/18/2020   PLT 186 08/18/2020   Lab Results  Component Value Date   NA 135 08/18/2020   K 3.6 08/18/2020   CO2 25 08/18/2020   GLUCOSE 113 (H) 08/18/2020   BUN 7 08/18/2020   CREATININE 0.75 08/18/2020   BILITOT 1.0 08/18/2020   ALKPHOS 59 08/18/2020   AST 47 (H) 08/18/2020   ALT 29 08/18/2020   PROT 7.4 08/18/2020   ALBUMIN 4.0 08/18/2020   CALCIUM 8.8 (L) 08/18/2020   ANIONGAP 7 08/18/2020   EGFR 121 08/05/2020   No results found for: CHOL No results found for: HDL No results found for: LDLCALC No results found for: TRIG No results found for: CHOLHDL No results found for: HGBA1C    Assessment & Plan:   Problem List Items Addressed This Visit       Other   Vitamin D deficiency   Hb-SS disease without crisis ([place]) - Primary   Relevant Orders   POCT URINALYSIS DIP (CLINITEK) (Completed)   Sickle Cell Panel   Chronic pain   Relevant Orders   [id number] 11+Oxyco+Alc+Crt-Bund   Sickle Cell Panel    No orders of the defined types were placed in this encounter.   Follow-up: Return in about 3 months (around  02/12/2021).   Donia Pounds  APRN, MSN, FNP-C Patient Paul Campos, [place] [address] [phone]

## 2020-11-14 LAB — CMP14+CBC/D/PLT+FER+RETIC+V...
ALT: 11 IU/L (ref 0–44)
AST: 16 IU/L (ref 0–40)
Albumin/Globulin Ratio: 1.6 (ref 1.2–2.2)
Albumin: 4.7 g/dL (ref 4.1–5.2)
Alkaline Phosphatase: 80 IU/L (ref 44–121)
BUN/Creatinine Ratio: 6 — ABNORMAL LOW (ref 9–20)
BUN: 5 mg/dL — ABNORMAL LOW (ref 6–20)
Basophils Absolute: 0 10*3/uL (ref 0.0–0.2)
Basos: 1 %
Bilirubin Total: 0.7 mg/dL (ref 0.0–1.2)
CO2: 22 mmol/L (ref 20–29)
Calcium: 9.6 mg/dL (ref 8.7–10.2)
Chloride: 102 mmol/L (ref 96–106)
Creatinine, Ser: 0.84 mg/dL (ref 0.76–1.27)
EOS (ABSOLUTE): 0.3 10*3/uL (ref 0.0–0.4)
Eos: 5 %
Ferritin: 266 ng/mL (ref 30–400)
Globulin, Total: 2.9 g/dL (ref 1.5–4.5)
Glucose: 80 mg/dL (ref 65–99)
Hematocrit: 39.6 % (ref 37.5–51.0)
Hemoglobin: 12.6 g/dL — ABNORMAL LOW (ref 13.0–17.7)
Immature Grans (Abs): 0 10*3/uL (ref 0.0–0.1)
Immature Granulocytes: 0 %
Lymphocytes Absolute: 1.3 10*3/uL (ref 0.7–3.1)
Lymphs: 27 %
MCH: 24.9 pg — ABNORMAL LOW (ref 26.6–33.0)
MCHC: 31.8 g/dL (ref 31.5–35.7)
MCV: 78 fL — ABNORMAL LOW (ref 79–97)
Monocytes Absolute: 0.6 10*3/uL (ref 0.1–0.9)
Monocytes: 11 %
NRBC: 1 % — ABNORMAL HIGH (ref 0–0)
Neutrophils Absolute: 2.8 10*3/uL (ref 1.4–7.0)
Neutrophils: 56 %
Platelets: 202 10*3/uL (ref 150–450)
Potassium: 4.7 mmol/L (ref 3.5–5.2)
RBC: 5.07 x10E6/uL (ref 4.14–5.80)
RDW: 17 % — ABNORMAL HIGH (ref 11.6–15.4)
Retic Ct Pct: 4.3 % — ABNORMAL HIGH (ref 0.6–2.6)
Sodium: 140 mmol/L (ref 134–144)
Total Protein: 7.6 g/dL (ref 6.0–8.5)
Vit D, 25-Hydroxy: 15.7 ng/mL — ABNORMAL LOW (ref 30.0–100.0)
WBC: 5.1 10*3/uL (ref 3.4–10.8)
eGFR: 122 mL/min/{1.73_m2} (ref >59)

## 2020-11-17 LAB — URINE CULTURE: Organism ID, Bacteria: NO GROWTH

## 2020-11-20 LAB — DRUG SCREEN 764883 11+OXYCO+ALC+CRT-BUND
Amphetamines, Urine: NEGATIVE ng/mL
BENZODIAZ UR QL: NEGATIVE ng/mL
Barbiturate: NEGATIVE ng/mL
Cocaine (Metabolite): NEGATIVE ng/mL
Creatinine: 151.8 mg/dL (ref 20.0–300.0)
Ethanol: NEGATIVE %
Meperidine: NEGATIVE ng/mL
Methadone Screen, Urine: NEGATIVE ng/mL
OPIATE SCREEN URINE: NEGATIVE ng/mL
Oxycodone/Oxymorphone, Urine: NEGATIVE ng/mL
Phencyclidine: NEGATIVE ng/mL
Propoxyphene: NEGATIVE ng/mL
Tramadol: NEGATIVE ng/mL
pH, Urine: 6.9 (ref 4.5–8.9)

## 2020-11-20 LAB — CANNABINOID CONFIRMATION, UR
CANNABINOIDS: POSITIVE — AB
Carboxy THC GC/MS Conf: 86 ng/mL

## 2020-11-28 ENCOUNTER — Other Ambulatory Visit: Payer: Self-pay

## 2020-11-28 ENCOUNTER — Other Ambulatory Visit: Payer: Self-pay | Admitting: Family Medicine

## 2020-11-28 DIAGNOSIS — G8929 Other chronic pain: Secondary | ICD-10-CM

## 2020-11-28 DIAGNOSIS — D571 Sickle-cell disease without crisis: Secondary | ICD-10-CM

## 2020-11-28 MED ORDER — OXYCODONE HCL 20 MG PO TABS: 20.0000 mg | ORAL_TABLET | Freq: Four times a day (QID) | ORAL | 0 refills | Status: DC | PRN

## 2020-11-28 NOTE — Progress Notes (Signed)
Reviewed PDMP substance reporting system prior to prescribing opiate medications. No inconsistencies noted.  Meds ordered this encounter  Medications   Oxycodone HCl 20 MG TABS    Sig: Take 1 tablet (20 mg total) by mouth every 6 (six) hours as needed (pain).    Dispense:  60 tablet    Refill:  0    Order Specific Question:   Supervising Provider    Answer:   [name], [name] [[id number]]   Deissy Guilbert [name] Philopater Mucha  APRN, MSN, FNP-C [facility] [address]  [place], [place] [address] [phone]  

## 2020-12-10 ENCOUNTER — Other Ambulatory Visit: Payer: Self-pay

## 2020-12-10 DIAGNOSIS — G8929 Other chronic pain: Secondary | ICD-10-CM

## 2020-12-10 DIAGNOSIS — D571 Sickle-cell disease without crisis: Secondary | ICD-10-CM

## 2020-12-11 ENCOUNTER — Other Ambulatory Visit: Payer: Self-pay | Admitting: Family Medicine

## 2020-12-11 DIAGNOSIS — D571 Sickle-cell disease without crisis: Secondary | ICD-10-CM

## 2020-12-11 DIAGNOSIS — G8929 Other chronic pain: Secondary | ICD-10-CM

## 2020-12-11 MED ORDER — OXYCODONE HCL 20 MG PO TABS: 20.0000 mg | ORAL_TABLET | Freq: Four times a day (QID) | ORAL | 0 refills | Status: DC | PRN

## 2020-12-11 NOTE — Progress Notes (Signed)
Reviewed PDMP substance reporting system prior to prescribing opiate medications. No inconsistencies noted.  Meds ordered this encounter  Medications   Oxycodone HCl 20 MG TABS    Sig: Take 1 tablet (20 mg total) by mouth every 6 (six) hours as needed (pain).    Dispense:  60 tablet    Refill:  0    Order Specific Question:   Supervising Provider    Answer:   [name], [name] [[id number]]   Paul Ketterman [name] Teddy Rebstock  APRN, MSN, FNP-C [facility] [address]  [place], [place] [address] [phone]  

## 2020-12-26 ENCOUNTER — Other Ambulatory Visit: Payer: Self-pay

## 2020-12-26 ENCOUNTER — Other Ambulatory Visit: Payer: Self-pay | Admitting: Family Medicine

## 2020-12-26 DIAGNOSIS — G8929 Other chronic pain: Secondary | ICD-10-CM

## 2020-12-26 DIAGNOSIS — D571 Sickle-cell disease without crisis: Secondary | ICD-10-CM

## 2020-12-26 MED ORDER — OXYCODONE HCL 20 MG PO TABS: 20.0000 mg | ORAL_TABLET | Freq: Four times a day (QID) | ORAL | 0 refills | Status: DC | PRN

## 2020-12-26 NOTE — Progress Notes (Signed)
Reviewed PDMP substance reporting system prior to prescribing opiate medications. No inconsistencies noted.  Meds ordered this encounter  Medications   Oxycodone HCl 20 MG TABS    Sig: Take 1 tablet (20 mg total) by mouth every 6 (six) hours as needed (pain).    Dispense:  60 tablet    Refill:  0    Order Specific Question:   Supervising Provider    Answer:   [name], [name] [[id number]]   Paul Olazabal [name] Gene Glazebrook  APRN, MSN, FNP-C [facility] [address]  [place], [place] [address] [phone]  

## 2021-01-13 ENCOUNTER — Other Ambulatory Visit: Payer: Self-pay | Admitting: Family Medicine

## 2021-01-13 DIAGNOSIS — G8929 Other chronic pain: Secondary | ICD-10-CM

## 2021-01-13 DIAGNOSIS — D571 Sickle-cell disease without crisis: Secondary | ICD-10-CM

## 2021-01-13 MED ORDER — OXYCODONE HCL 20 MG PO TABS: 20.0000 mg | ORAL_TABLET | Freq: Four times a day (QID) | ORAL | 0 refills | Status: DC | PRN

## 2021-01-13 NOTE — Progress Notes (Signed)
Reviewed PDMP substance reporting system prior to prescribing opiate medications. No inconsistencies noted.  Meds ordered this encounter  Medications   Oxycodone HCl 20 MG TABS    Sig: Take 1 tablet (20 mg total) by mouth every 6 (six) hours as needed (pain).    Dispense:  60 tablet    Refill:  0    Order Specific Question:   Supervising Provider    Answer:   [name], [name] [[id number]]   Sadira Standard [name] Kendon Sedeno  APRN, MSN, FNP-C [facility] [address]  [place], [place] [address] [phone]  

## 2021-02-01 ENCOUNTER — Other Ambulatory Visit: Payer: Self-pay | Admitting: Family Medicine

## 2021-02-01 DIAGNOSIS — G8929 Other chronic pain: Secondary | ICD-10-CM

## 2021-02-01 DIAGNOSIS — D571 Sickle-cell disease without crisis: Secondary | ICD-10-CM

## 2021-02-01 MED ORDER — OXYCODONE HCL 20 MG PO TABS: 20.0000 mg | ORAL_TABLET | Freq: Four times a day (QID) | ORAL | 0 refills | Status: DC | PRN

## 2021-02-01 NOTE — Progress Notes (Signed)
Reviewed PDMP substance reporting system prior to prescribing opiate medications. No inconsistencies noted.  Meds ordered this encounter  Medications   Oxycodone HCl 20 MG TABS    Sig: Take 1 tablet (20 mg total) by mouth every 6 (six) hours as needed (pain).    Dispense:  60 tablet    Refill:  0    Order Specific Question:   Supervising Provider    Answer:   [name], [name] [[id number]]   Eimy Plaza [name] Wynne Rozak  APRN, MSN, FNP-C [facility] [address]  [place], [place] [address] [phone]  

## 2021-02-12 ENCOUNTER — Ambulatory Visit: Payer: Medicaid Other | Admitting: Family Medicine

## 2021-02-17 ENCOUNTER — Ambulatory Visit: Payer: Medicaid Other | Admitting: Family Medicine

## 2021-02-20 ENCOUNTER — Other Ambulatory Visit: Payer: Self-pay | Admitting: Family Medicine

## 2021-02-20 DIAGNOSIS — D571 Sickle-cell disease without crisis: Secondary | ICD-10-CM

## 2021-02-20 DIAGNOSIS — G8929 Other chronic pain: Secondary | ICD-10-CM

## 2021-02-20 MED ORDER — OXYCODONE HCL 20 MG PO TABS: 20.0000 mg | ORAL_TABLET | Freq: Four times a day (QID) | ORAL | 0 refills | Status: DC | PRN

## 2021-02-20 NOTE — Progress Notes (Signed)
Reviewed PDMP substance reporting system prior to prescribing opiate medications. No inconsistencies noted.  Meds ordered this encounter  Medications   Oxycodone HCl 20 MG TABS    Sig: Take 1 tablet (20 mg total) by mouth every 6 (six) hours as needed (pain).    Dispense:  60 tablet    Refill:  0    Order Specific Question:   Supervising Provider    Answer:   [name], [name] [[id number]]   Kewanda Poland [name] Ulah Olmo  APRN, MSN, FNP-C [facility] [address]  [place], [place] [address] [phone]  

## 2021-03-06 ENCOUNTER — Other Ambulatory Visit: Payer: Self-pay | Admitting: Family Medicine

## 2021-03-06 DIAGNOSIS — G8929 Other chronic pain: Secondary | ICD-10-CM

## 2021-03-06 DIAGNOSIS — D571 Sickle-cell disease without crisis: Secondary | ICD-10-CM

## 2021-03-06 MED ORDER — OXYCODONE HCL 20 MG PO TABS: 20.0000 mg | ORAL_TABLET | Freq: Four times a day (QID) | ORAL | 0 refills | Status: DC | PRN

## 2021-03-06 NOTE — Progress Notes (Signed)
Reviewed PDMP substance reporting system prior to prescribing opiate medications. No inconsistencies noted.  Meds ordered this encounter  Medications   Oxycodone HCl 20 MG TABS    Sig: Take 1 tablet (20 mg total) by mouth every 6 (six) hours as needed (pain).    Dispense:  60 tablet    Refill:  0    Order Specific Question:   Supervising Provider    Answer:   [name], [name] [[id number]]   Paul Campos Paul Peerson  APRN, MSN, FNP-C [facility] [address]  [place], [place] [address] [phone]  

## 2021-03-17 ENCOUNTER — Other Ambulatory Visit: Payer: Self-pay

## 2021-03-17 ENCOUNTER — Ambulatory Visit: Payer: Medicaid Other | Admitting: Family Medicine

## 2021-03-17 ENCOUNTER — Encounter: Payer: Self-pay | Admitting: Family Medicine

## 2021-03-17 VITALS — BP 122/84 | HR 89 | Temp 97.6°F | Ht 72.0 in | Wt 180.0 lb

## 2021-03-17 DIAGNOSIS — G8929 Other chronic pain: Secondary | ICD-10-CM | POA: Diagnosis not present

## 2021-03-17 DIAGNOSIS — E559 Vitamin D deficiency, unspecified: Secondary | ICD-10-CM | POA: Diagnosis not present

## 2021-03-17 DIAGNOSIS — D571 Sickle-cell disease without crisis: Secondary | ICD-10-CM

## 2021-03-17 LAB — POCT URINALYSIS DIP (CLINITEK)
Bilirubin, UA: NEGATIVE
Blood, UA: NEGATIVE
Glucose, UA: NEGATIVE mg/dL
Ketones, POC UA: NEGATIVE mg/dL
Leukocytes, UA: NEGATIVE
Nitrite, UA: NEGATIVE
POC PROTEIN,UA: NEGATIVE
Spec Grav, UA: 1.02 (ref 1.010–1.025)
Urobilinogen, UA: 1 E.U./dL
pH, UA: 7 (ref 5.0–8.0)

## 2021-03-17 NOTE — Patient Instructions (Signed)
Sickle Cell Anemia, Adult ?Sickle cell anemia is a condition where your red blood cells are shaped like sickles. Red blood cells carry oxygen through the body. Sickle-shaped cells do not live as long as normal red blood cells. They also clump together and block blood from flowing through the blood vessels. This prevents the body from getting enough oxygen. Sickle cell anemia causes organ damage and pain. It also increases the risk of infection. ?Follow these instructions at home: ?Medicines ?Take over-the-counter and prescription medicines only as told by your doctor. ?If you were prescribed an antibiotic medicine, take it as told by your doctor. Do not stop taking the antibiotic even if you start to feel better. ?If you develop a fever, do not take medicines to lower the fever right away. Tell your doctor about the fever. ?Managing pain, stiffness, and swelling ?Try these methods to help with pain: ?Use a heating pad. ?Take a warm bath. ?Distract yourself, such as by watching TV. ?Eating and drinking ?Drink enough fluid to keep your pee (urine) clear or pale yellow. Drink more in hot weather and during exercise. ?Limit or avoid alcohol. ?Eat a healthy diet. Eat plenty of fruits, vegetables, whole grains, and lean protein. ?Take vitamins and supplements as told by your doctor. ?Traveling ?When traveling, keep these with you: ?Your medical information. ?The names of your doctors. ?Your medicines. ?If you need to take an airplane, talk to your doctor first. ?Activity ?Rest often. ?Avoid exercises that make your heart beat much faster, such as jogging. ?General instructions ?Do not use products that have nicotine or tobacco, such as cigarettes and e-cigarettes. If you need help quitting, ask your doctor. ?Consider wearing a medical alert bracelet. ?Avoid being in high places (high altitudes), such as mountains. ?Avoid very hot or cold temperatures. ?Avoid places where the temperature changes a lot. ?Keep all follow-up  visits as told by your doctor. This is important. ?Contact a doctor if: ?A joint hurts. ?Your feet or hands hurt or swell. ?You feel tired (fatigued). ?Get help right away if: ?You have symptoms of infection. These include: ?Fever. ?Chills. ?Being very tired. ?Irritability. ?Poor eating. ?Throwing up (vomiting). ?You feel dizzy or faint. ?You have new stomach pain, especially on the left side. ?You have a an erection (priapism) that lasts more than 4 hours. ?You have numbness in your arms or legs. ?You have a hard time moving your arms or legs. ?You have trouble talking. ?You have pain that does not go away when you take medicine. ?You are short of breath. ?You are breathing fast. ?You have a long-term cough. ?You have pain in your chest. ?You have a bad headache. ?You have a stiff neck. ?Your stomach looks bloated even though you did not eat much. ?Your skin is pale. ?You suddenly cannot see well. ?Summary ?Sickle cell anemia is a condition where your red blood cells are shaped like sickles. ?Follow your doctor's advice on ways to manage pain, food to eat, activities to do, and steps to take for safe travel. ?Get medical help right away if you have any signs of infection, such as a fever. ?This information is not intended to replace advice given to you by your health care provider. Make sure you discuss any questions you have with your health care provider. ?Document Revised: 07/05/2019 Document Reviewed: 07/05/2019 ?Elsevier Patient Education ? 2022 [employer] ? ?

## 2021-03-17 NOTE — Progress Notes (Signed)
[facility]   Established Patient Office Visit  Subjective:  Patient ID: Paul Campos, male    DOB: 1990/12/04  Age: 31 y.o. MRN: [id number]  CC:  Chief Complaint  Patient presents with   Follow-up    Pt is here today for his 3 month follow up visit with no concerns or issues to discuss.    HPI Paul Campos is a 31 year old male with a medical history significant for sickle cell disease, chronic pain syndrome, opiate dependence and tolerance, and history of anemia of chronic disease presents for a 19-monthfollow-up of sickle cell.  Patient generally has well-controlled sickle cell with infrequent pain crisis.  Patient has chronic pain primarily to lower back.  He rates his pain as 5/10 today.  He last had Tylenol this a.m. with some relief.  He has been taking folic acid and hydroxyurea consistently.  He denies any headache, chest pain, urinary symptoms, nausea, vomiting, or diarrhea.  Past Medical History:  Diagnosis Date   Headache    Sickle cell anemia (HCC)     Past Surgical History:  Procedure Laterality Date   APPENDECTOMY      Family History  Problem Relation Age of Onset   Lung cancer Mother    Migraines Mother     Social History   Socioeconomic History   Marital status: Single    Spouse name: Not on file   Number of children: 1   Years of education: Not on file   Highest education level: High school graduate  Occupational History    Comment: none 09/03/19  Tobacco Use   Smoking status: Never   Smokeless tobacco: Never  Vaping Use   Vaping Use: Never used  Substance and Sexual Activity   Alcohol use: No   Drug use: No   Sexual activity: Yes    Birth control/protection: Condom  Other Topics Concern   Not on file  Social History Narrative   Caffeine- maybe coffee every other morning   Social Determinants of Health   Financial Resource Strain: Not on file  Food Insecurity: Not on file  Transportation  Needs: Not on file  Physical Activity: Not on file  Stress: Not on file  Social Connections: Not on file  Intimate Partner Violence: Not on file    Outpatient Medications Prior to Visit  Medication Sig Dispense Refill   folic acid (FOLVITE) 8794MCG tablet Take 1 tablet (800 mcg total) by mouth daily. 90 tablet 3   hydroxyurea (HYDREA) 500 MG capsule Take 1 capsule (500 mg total) by mouth 2 (two) times daily. May take with food to minimize GI side effects. 90 capsule 2   ibuprofen (ADVIL) 800 MG tablet Take 1 tablet (800 mg total) by mouth every 8 (eight) hours as needed for moderate pain. 30 tablet 1   Oxycodone HCl 20 MG TABS Take 1 tablet (20 mg total) by mouth every 6 (six) hours as needed (pain). 60 tablet 0   No facility-administered medications prior to visit.    Allergies  Allergen Reactions   Dilaudid [Hydromorphone Hcl] Hives   Hydromorphone Itching    Morphine okay.   Cefotaxime Itching and Rash    Hives    ROS Review of Systems  Constitutional: Negative.   HENT: Negative.  Negative for congestion.   Eyes: Negative.   Respiratory: Negative.    Cardiovascular: Negative.   Gastrointestinal: Negative.   Genitourinary: Negative.   Musculoskeletal:  Positive for back  Neurological: Negative.   °Hematological: Negative.   ° °  °Objective:  °  °Physical Exam °Constitutional:   °   Appearance: Normal appearance.  °Eyes:  °   Pupils: Pupils are equal, round, and reactive to light.  °Cardiovascular:  °   Rate and Rhythm: Normal rate and regular rhythm.  °Pulmonary:  °   Effort: Pulmonary effort is normal.  °Abdominal:  °   General: Bowel sounds are normal.  °Musculoskeletal:     °   General: Normal range of motion.  °Skin: °   General: Skin is warm.  °Neurological:  °   General: No focal deficit present.  °   Mental Status: Mental status is at baseline.  °Psychiatric:     °   Mood and Affect: Mood normal.     °   Behavior: Behavior normal.     °   Thought Content: Thought  content normal.     °   Judgment: Judgment normal.  ° ° °BP 122/84    Pulse 89    Temp 97.6 °F (36.4 °C)    Ht 6' (1.829 m)    Wt 180 lb (81.6 kg)    SpO2 100%    BMI 24.41 kg/m²  °Wt Readings from Last 3 Encounters:  °03/17/21 180 lb (81.6 kg)  °11/13/20 175 lb 6.4 oz (79.6 kg)  °08/19/20 175 lb (79.4 kg)  ° ° ° °Health Maintenance Due  °Topic Date Due  ° COVID-19 Vaccine (1) Never done  ° ° °There are no preventive care reminders to display for this patient. ° °No results found for: TSH °Lab Results  °Component Value Date  ° WBC 5.1 11/13/2020  ° HGB 12.6 (L) 11/13/2020  ° HCT 39.6 11/13/2020  ° MCV 78 (L) 11/13/2020  ° PLT 202 11/13/2020  ° °Lab Results  °Component Value Date  ° NA 140 11/13/2020  ° K 4.7 11/13/2020  ° CO2 22 11/13/2020  ° GLUCOSE 80 11/13/2020  ° BUN 5 (L) 11/13/2020  ° CREATININE 0.84 11/13/2020  ° BILITOT 0.7 11/13/2020  ° ALKPHOS 80 11/13/2020  ° AST 16 11/13/2020  ° ALT 11 11/13/2020  ° PROT 7.6 11/13/2020  ° ALBUMIN 4.7 11/13/2020  ° CALCIUM 9.6 11/13/2020  ° ANIONGAP 7 08/18/2020  ° EGFR 122 11/13/2020  ° °No results found for: CHOL °No results found for: HDL °No results found for: LDLCALC °No results found for: TRIG °No results found for: CHOLHDL °No results found for: HGBA1C ° °  °Assessment & Plan:  ° °Problem List Items Addressed This Visit   ° °  ° Other  ° Vitamin D deficiency  ° Relevant Orders  ° Sickle Cell Panel  ° Hb-SS disease without crisis (HCC) - Primary  ° Relevant Orders  ° POCT URINALYSIS DIP (CLINITEK) (Completed)  ° 764883 11+Oxyco+Alc+Crt-Bund  ° Sickle Cell Panel  ° Chronic pain  ° Relevant Orders  ° [id number] 11+Oxyco+Alc+Crt-Bund  ° ° °Follow-up: Return in about 3 months (around 06/15/2021) for sickle cell anemia.  ° ° [name]   APRN, MSN, FNP-C °[facility] °[facility] °[address]  °[place], [place] [address] °[phone] ° °

## 2021-03-18 ENCOUNTER — Other Ambulatory Visit: Payer: Medicaid Other

## 2021-03-19 LAB — DRUG SCREEN 764883 11+OXYCO+ALC+CRT-BUND
Amphetamines, Urine: NEGATIVE ng/mL
BENZODIAZ UR QL: NEGATIVE ng/mL
Barbiturate: NEGATIVE ng/mL
Cannabinoid Quant, Ur: NEGATIVE ng/mL
Cocaine (Metabolite): NEGATIVE ng/mL
Creatinine: 136 mg/dL (ref 20.0–300.0)
Ethanol: NEGATIVE %
Meperidine: NEGATIVE ng/mL
Methadone Screen, Urine: NEGATIVE ng/mL
OPIATE SCREEN URINE: NEGATIVE ng/mL
Oxycodone/Oxymorphone, Urine: NEGATIVE ng/mL
Phencyclidine: NEGATIVE ng/mL
Propoxyphene: NEGATIVE ng/mL
Tramadol: NEGATIVE ng/mL
pH, Urine: 6.9 (ref 4.5–8.9)

## 2021-03-20 ENCOUNTER — Other Ambulatory Visit: Payer: Self-pay | Admitting: Family Medicine

## 2021-03-20 DIAGNOSIS — D571 Sickle-cell disease without crisis: Secondary | ICD-10-CM

## 2021-03-20 DIAGNOSIS — G8929 Other chronic pain: Secondary | ICD-10-CM

## 2021-03-20 MED ORDER — OXYCODONE HCL 20 MG PO TABS: 20.0000 mg | ORAL_TABLET | Freq: Four times a day (QID) | ORAL | 0 refills | Status: DC | PRN

## 2021-03-20 NOTE — Progress Notes (Signed)
Reviewed PDMP substance reporting system prior to prescribing opiate medications. No inconsistencies noted.  Meds ordered this encounter  Medications   Oxycodone HCl 20 MG TABS    Sig: Take 1 tablet (20 mg total) by mouth every 6 (six) hours as needed (pain).    Dispense:  60 tablet    Refill:  0    Order Specific Question:   Supervising Provider    Answer:   [name], [name] [[id number]]   Jecenia Leamer [name] Izayah Miner  APRN, MSN, FNP-C [facility] [address]  , [place] [address] [phone]  

## 2021-04-03 ENCOUNTER — Other Ambulatory Visit: Payer: Self-pay | Admitting: Family Medicine

## 2021-04-03 DIAGNOSIS — G8929 Other chronic pain: Secondary | ICD-10-CM

## 2021-04-03 DIAGNOSIS — D571 Sickle-cell disease without crisis: Secondary | ICD-10-CM

## 2021-04-03 MED ORDER — OXYCODONE HCL 20 MG PO TABS: 20.0000 mg | ORAL_TABLET | Freq: Four times a day (QID) | ORAL | 0 refills | Status: DC | PRN

## 2021-04-03 NOTE — Progress Notes (Signed)
Reviewed PDMP substance reporting system prior to prescribing opiate medications. No inconsistencies noted.  Meds ordered this encounter  Medications   Oxycodone HCl 20 MG TABS    Sig: Take 1 tablet (20 mg total) by mouth every 6 (six) hours as needed (pain).    Dispense:  60 tablet    Refill:  0    Order Specific Question:   Supervising Provider    Answer:   [name], [name] [[id number]]   Paul Wilds [name] Caralee Morea  APRN, MSN, FNP-C [facility] [address]  [place], [place] [address] [phone]  

## 2021-04-20 ENCOUNTER — Other Ambulatory Visit: Payer: Self-pay | Admitting: Family Medicine

## 2021-04-20 DIAGNOSIS — D571 Sickle-cell disease without crisis: Secondary | ICD-10-CM

## 2021-04-20 DIAGNOSIS — G8929 Other chronic pain: Secondary | ICD-10-CM

## 2021-04-20 MED ORDER — OXYCODONE HCL 20 MG PO TABS: 20.0000 mg | ORAL_TABLET | Freq: Four times a day (QID) | ORAL | 0 refills | Status: DC | PRN

## 2021-04-20 NOTE — Progress Notes (Signed)
Reviewed PDMP substance reporting system prior to prescribing opiate medications. No inconsistencies noted.  Meds ordered this encounter  Medications   Oxycodone HCl 20 MG TABS    Sig: Take 1 tablet (20 mg total) by mouth every 6 (six) hours as needed (pain).    Dispense:  60 tablet    Refill:  0    Order Specific Question:   Supervising Provider    Answer:   [name], [name] [[id number]]   Paul Campos Paul Snooks  APRN, MSN, FNP-C [facility] [address]  [place], [place] [address] [phone]  

## 2021-05-04 ENCOUNTER — Other Ambulatory Visit: Payer: Self-pay | Admitting: Family Medicine

## 2021-05-04 DIAGNOSIS — G8929 Other chronic pain: Secondary | ICD-10-CM

## 2021-05-04 DIAGNOSIS — D571 Sickle-cell disease without crisis: Secondary | ICD-10-CM

## 2021-05-04 MED ORDER — OXYCODONE HCL 20 MG PO TABS: 20.0000 mg | ORAL_TABLET | Freq: Four times a day (QID) | ORAL | 0 refills | Status: DC | PRN

## 2021-05-04 NOTE — Progress Notes (Signed)
Reviewed PDMP substance reporting system prior to prescribing opiate medications. No inconsistencies noted.  Meds ordered this encounter  Medications   Oxycodone HCl 20 MG TABS    Sig: Take 1 tablet (20 mg total) by mouth every 6 (six) hours as needed (pain).    Dispense:  60 tablet    Refill:  0    Order Specific Question:   Supervising Provider    Answer:   [name], [name] [[id number]]   Paul Exley [name] Helena Sardo  APRN, MSN, FNP-C [facility] [address]  [place], [place] [address] [phone]  

## 2021-05-13 ENCOUNTER — Ambulatory Visit: Payer: Medicaid Other | Admitting: Family Medicine

## 2021-05-18 ENCOUNTER — Other Ambulatory Visit: Payer: Self-pay | Admitting: Family Medicine

## 2021-05-18 DIAGNOSIS — D571 Sickle-cell disease without crisis: Secondary | ICD-10-CM

## 2021-05-18 DIAGNOSIS — G8929 Other chronic pain: Secondary | ICD-10-CM

## 2021-05-18 MED ORDER — OXYCODONE HCL 20 MG PO TABS: 20.0000 mg | ORAL_TABLET | Freq: Four times a day (QID) | ORAL | 0 refills | Status: DC | PRN

## 2021-05-18 NOTE — Progress Notes (Signed)
Reviewed PDMP substance reporting system prior to prescribing opiate medications. No inconsistencies noted.  Meds ordered this encounter  Medications   Oxycodone HCl 20 MG TABS    Sig: Take 1 tablet (20 mg total) by mouth every 6 (six) hours as needed (pain).    Dispense:  60 tablet    Refill:  0    Order Specific Question:   Supervising Provider    Answer:   [name], [name] [[id number]]   Paul Campos Paul Hinze  APRN, MSN, FNP-C [facility] [address]  [place], [place] [address] [phone]  

## 2021-06-01 ENCOUNTER — Other Ambulatory Visit: Payer: Self-pay | Admitting: Internal Medicine

## 2021-06-01 DIAGNOSIS — G8929 Other chronic pain: Secondary | ICD-10-CM

## 2021-06-01 DIAGNOSIS — D571 Sickle-cell disease without crisis: Secondary | ICD-10-CM

## 2021-06-01 MED ORDER — OXYCODONE HCL 20 MG PO TABS: 20.0000 mg | ORAL_TABLET | Freq: Four times a day (QID) | ORAL | 0 refills | Status: DC | PRN

## 2021-06-15 ENCOUNTER — Other Ambulatory Visit: Payer: Self-pay | Admitting: Family Medicine

## 2021-06-15 DIAGNOSIS — G8929 Other chronic pain: Secondary | ICD-10-CM

## 2021-06-15 DIAGNOSIS — D571 Sickle-cell disease without crisis: Secondary | ICD-10-CM

## 2021-06-15 MED ORDER — OXYCODONE HCL 20 MG PO TABS: 20.0000 mg | ORAL_TABLET | Freq: Four times a day (QID) | ORAL | 0 refills | Status: AC | PRN

## 2021-06-15 NOTE — Progress Notes (Unsigned)
Reviewed PDMP substance reporting system prior to prescribing opiate medications. No inconsistencies noted.  Meds ordered this encounter  Medications   Oxycodone HCl 20 MG TABS    Sig: Take 1 tablet (20 mg total) by mouth every 6 (six) hours as needed for up to 15 days (pain).    Dispense:  60 tablet    Refill:  0    Order Specific Question:   Supervising Provider    Answer:   [name], [name] [[id number]]   Paul Drew [name] Siddhi Dornbush  APRN, MSN, FNP-C [facility] [address]  [place], [place] [address] [phone]  

## 2021-06-16 ENCOUNTER — Encounter: Payer: Self-pay | Admitting: Family Medicine

## 2021-06-16 ENCOUNTER — Ambulatory Visit: Payer: Medicaid Other | Admitting: Family Medicine

## 2021-06-16 VITALS — BP 120/84 | HR 64 | Temp 98.4°F | Ht 72.0 in | Wt 182.0 lb

## 2021-06-16 DIAGNOSIS — D571 Sickle-cell disease without crisis: Secondary | ICD-10-CM | POA: Diagnosis not present

## 2021-06-16 DIAGNOSIS — E559 Vitamin D deficiency, unspecified: Secondary | ICD-10-CM | POA: Diagnosis not present

## 2021-06-16 NOTE — Progress Notes (Signed)
?[facility] [place] ?[facility] ? ? ? ? ?Subjective   ?Patient ID: Paul Campos, male    DOB: [birth year]  Age: 31 y.o. MRN: [id number] ? ?Chief Complaint  ?Patient presents with  ? Follow-up  ?  Pt is here for 3 month follow up visit. No issues or concerns  ? ? ?Paul Campos is a 31 year old male with a medical history significant for sickle cell disease, chronic pain syndrome, opiate dependence and tolerance, and anemia of chronic disease presents for a 3 month follow up of chronic conditions. Patient is accompanied by his fiance. Patient is currently preparing for the birth of his son.  ?He says that he is doing very well and is without complaint on today. ? ?Patient has a history of chronic pain. Pain is primarily to low back and lower extremities. Currently, pain intensity is 5/10. Patient last had oxycodone on yesterday with moderate relief.  ? ?He currently denies headache, chest pain, shortness of breath, urinary symptoms, nausea, vomiting, or diarrhea.  ? ? ?Past Medical History:  ?Diagnosis Date  ? Headache   ? Sickle cell anemia (HCC)   ?  ?Social History  ? ?Socioeconomic History  ? Marital status: Single  ?  Spouse name: Not on file  ? Number of children: 1  ? Years of education: Not on file  ? Highest education level: High school graduate  ?Occupational History  ?  Comment: none 09/03/19  ?Tobacco Use  ? Smoking status: Never  ? Smokeless tobacco: Never  ?Vaping Use  ? Vaping Use: Never used  ?Substance and Sexual Activity  ? Alcohol use: No  ? Drug use: No  ? Sexual activity: Yes  ?  Birth control/protection: Condom  ?Other Topics Concern  ? Not on file  ?Social History Narrative  ? Caffeine- maybe coffee every other morning  ? ?Social Determinants of Health  ? ?Financial Resource Strain: Not on file  ?Food Insecurity: Not on file  ?Transportation Needs: Not on file  ?Physical Activity: Not on file  ?Stress: Not on file  ?Social Connections: Not on file  ?Intimate Partner  Violence: Not on file  ? ?Immunization History  ?Administered Date(s) Administered  ? Hepatitis B, ped/adol 07/09/2008  ? HiB (PRP-OMP) 11/01/2011  ? Influenza Split 01/31/2012  ? Influenza,inj,Quad PF,6+ Mos 11/13/2020  ? Influenza,inj,quad, With Preservative 03/05/2013, 01/21/2014  ? Influenza-Unspecified 11/07/2019  ? Meningococcal Conjugate 06/21/2006, 06/27/2007  ? Pneumococcal Polysaccharide-23 03/05/2013, 08/05/2020  ? Pneumococcal-Unspecified 06/27/2007  ? Tdap 04/29/2020  ? ? ?Review of Systems  ?Constitutional: Negative.   ?HENT: Negative.    ?Eyes: Negative.   ?Respiratory: Negative.    ?Cardiovascular: Negative.   ?Gastrointestinal: Negative.   ?Genitourinary: Negative.   ?Musculoskeletal:  Positive for back pain and joint pain.  ?Skin: Negative.   ?Neurological: Negative.   ?Psychiatric/Behavioral: Negative.    ? ?  ?Objective:  ?  ? ?BP 120/84 (BP Location: Right Arm, Patient Position: Sitting, Cuff Size: Normal)   Pulse 64   Temp 98.4 ?F (36.9 ?C)   Ht 6' (1.829 m)   Wt 182 lb (82.6 kg)   SpO2 100%   BMI 24.68 kg/m?  ?BP Readings from Last 3 Encounters:  ?06/16/21 120/84  ?03/17/21 122/84  ?11/13/20 122/81  ? ?Wt Readings from Last 3 Encounters:  ?06/16/21 182 lb (82.6 kg)  ?03/17/21 180 lb (81.6 kg)  ?11/13/20 175 lb 6.4 oz (79.6 kg)  ? ?  ? ?Physical Exam ?Constitutional:   ?  Appearance: Normal appearance.  ?Eyes:  ?   Pupils: Pupils are equal, round, and reactive to light.  ?Cardiovascular:  ?   Rate and Rhythm: Normal rate and regular rhythm.  ?   Pulses: Normal pulses.  ?Pulmonary:  ?   Effort: Pulmonary effort is normal.  ?Abdominal:  ?   General: Bowel sounds are normal.  ?Skin: ?   General: Skin is warm.  ?Neurological:  ?   General: No focal deficit present.  ?   Mental Status: He is alert. Mental status is at baseline.  ?Psychiatric:     ?   Mood and Affect: Mood normal.     ?   Behavior: Behavior normal.     ?   Thought Content: Thought content normal.     ?   Judgment: Judgment  normal.  ? ? ?Last CBC ?Lab Results  ?Component Value Date  ? WBC 5.1 11/13/2020  ? HGB 12.6 (L) 11/13/2020  ? HCT 39.6 11/13/2020  ? MCV 78 (L) 11/13/2020  ? MCH 24.9 (L) 11/13/2020  ? RDW 17.0 (H) 11/13/2020  ? PLT 202 11/13/2020  ? ?Last metabolic panel ?Lab Results  ?Component Value Date  ? GLUCOSE 80 11/13/2020  ? NA 140 11/13/2020  ? K 4.7 11/13/2020  ? CL 102 11/13/2020  ? CO2 22 11/13/2020  ? BUN 5 (L) 11/13/2020  ? CREATININE 0.84 11/13/2020  ? EGFR 122 11/13/2020  ? CALCIUM 9.6 11/13/2020  ? PROT 7.6 11/13/2020  ? ALBUMIN 4.7 11/13/2020  ? LABGLOB 2.9 11/13/2020  ? AGRATIO 1.6 11/13/2020  ? BILITOT 0.7 11/13/2020  ? ALKPHOS 80 11/13/2020  ? AST 16 11/13/2020  ? ALT 11 11/13/2020  ? ANIONGAP 7 08/18/2020  ? ?Last lipids ?No results found for: CHOL, HDL, LDLCALC, LDLDIRECT, TRIG, CHOLHDL ?Last hemoglobin A1c ?No results found for: HGBA1C ?Last thyroid functions ?No results found for: TSH, T3TOTAL, T4TOTAL, THYROIDAB ?Last vitamin D ?Lab Results  ?Component Value Date  ? VD25OH 15.7 (L) 11/13/2020  ? ?  ? ?The ASCVD Risk score (Arnett DK, et al., 2019) failed to calculate for the following reasons: ?  The 2019 ASCVD risk score is only valid for ages 35 to 33 ? ?  ?Assessment & Plan:  ? ?Problem List Items Addressed This Visit   ? ?  ? Other  ? Vitamin D deficiency  ? Hb-SS disease without crisis ([place]) - Primary  ? ? ?1. Hb-SS disease without crisis ([place]) ?Sickle cell disease - Continue Hydrea at current dose.  We discussed the need for good hydration, monitoring of hydration status, avoidance of heat, cold, stress, and infection triggers. We discussed the risks and benefits of Hydrea, including bone marrow suppression, the possibility of GI upset, skin ulcers, hair thinning, and teratogenicity. The patient was reminded of the need to seek medical attention of any symptoms of bleeding, anemia, or infection. Continue folic acid 1 mg daily to prevent aplastic bone marrow crises.  ? ?Pulmonary evaluation -  Patient denies severe recurrent wheezes, shortness of breath with exercise, or persistent cough. If these symptoms develop, pulmonary function tests with spirometry will be ordered, and if abnormal, plan on referral to Pulmonology for further evaluation. ? ?Cardiac - Routine screening for pulmonary hypertension is not recommended. ? ?Eye - High risk of proliferative retinopathy. Annual eye exam with retinal exam recommended to patient. ? ?Immunization status - Patient refuses COVID 19 vaccination.  ? ?- Sickle Cell Panel ? ?2. Vitamin D deficiency ? ?- Sickle Cell Panel ? ? ?  Return in about 3 months (around 09/15/2021) for sickle cell anemia.  ? ? ?Donia Pounds  APRN, MSN, FNP-C ?[facility] [place] ?[facility] ?[address]  ?[place], [place] [address] ?[phone] ? ?

## 2021-06-17 LAB — CMP14+CBC/D/PLT+FER+RETIC+V...
ALT: 11 IU/L (ref 0–44)
AST: 20 IU/L (ref 0–40)
Albumin/Globulin Ratio: 1.8 (ref 1.2–2.2)
Albumin: 4.8 g/dL (ref 4.1–5.2)
Alkaline Phosphatase: 68 IU/L (ref 44–121)
BUN/Creatinine Ratio: 6 — ABNORMAL LOW (ref 9–20)
BUN: 5 mg/dL — ABNORMAL LOW (ref 6–20)
Basophils Absolute: 0.1 10*3/uL (ref 0.0–0.2)
Basos: 1 %
Bilirubin Total: 0.8 mg/dL (ref 0.0–1.2)
CO2: 22 mmol/L (ref 20–29)
Calcium: 9.2 mg/dL (ref 8.7–10.2)
Chloride: 101 mmol/L (ref 96–106)
Creatinine, Ser: 0.89 mg/dL (ref 0.76–1.27)
EOS (ABSOLUTE): 0.8 10*3/uL — ABNORMAL HIGH (ref 0.0–0.4)
Eos: 7 %
Ferritin: 212 ng/mL (ref 30–400)
Globulin, Total: 2.7 g/dL (ref 1.5–4.5)
Glucose: 96 mg/dL (ref 70–99)
Hematocrit: 36.5 % — ABNORMAL LOW (ref 37.5–51.0)
Hemoglobin: 12.6 g/dL — ABNORMAL LOW (ref 13.0–17.7)
Immature Grans (Abs): 0 10*3/uL (ref 0.0–0.1)
Immature Granulocytes: 0 %
Lymphocytes Absolute: 3.1 10*3/uL (ref 0.7–3.1)
Lymphs: 28 %
MCH: 25.7 pg — ABNORMAL LOW (ref 26.6–33.0)
MCHC: 34.5 g/dL (ref 31.5–35.7)
MCV: 75 fL — ABNORMAL LOW (ref 79–97)
Monocytes Absolute: 1.2 10*3/uL — ABNORMAL HIGH (ref 0.1–0.9)
Monocytes: 11 %
Neutrophils Absolute: 6 10*3/uL (ref 1.4–7.0)
Neutrophils: 53 %
Platelets: 225 10*3/uL (ref 150–450)
Potassium: 4.3 mmol/L (ref 3.5–5.2)
RBC: 4.9 x10E6/uL (ref 4.14–5.80)
RDW: 19.3 % — ABNORMAL HIGH (ref 11.6–15.4)
Retic Ct Pct: 4.6 % — ABNORMAL HIGH (ref 0.6–2.6)
Sodium: 140 mmol/L (ref 134–144)
Total Protein: 7.5 g/dL (ref 6.0–8.5)
Vit D, 25-Hydroxy: 20.3 ng/mL — ABNORMAL LOW (ref 30.0–100.0)
WBC: 11.2 10*3/uL — ABNORMAL HIGH (ref 3.4–10.8)
eGFR: 119 mL/min/{1.73_m2} (ref >59)

## 2021-06-22 ENCOUNTER — Ambulatory Visit: Payer: Medicaid Other | Admitting: Family Medicine

## 2021-06-22 ENCOUNTER — Encounter: Payer: Self-pay | Admitting: Family Medicine

## 2021-06-22 VITALS — BP 120/80 | HR 71 | Ht 72.0 in | Wt 186.4 lb

## 2021-06-22 DIAGNOSIS — G43009 Migraine without aura, not intractable, without status migrainosus: Secondary | ICD-10-CM | POA: Diagnosis not present

## 2021-06-22 MED ORDER — NURTEC 75 MG PO TBDP
75.0000 mg | ORAL_TABLET | Freq: Every day | ORAL | 11 refills | Status: DC | PRN
Start: 2021-06-22 — End: 2021-06-30

## 2021-06-22 MED ORDER — TOPIRAMATE 50 MG PO TABS
50.0000 mg | ORAL_TABLET | Freq: Two times a day (BID) | ORAL | 3 refills | Status: AC
Start: 2021-06-22 — End: ?

## 2021-06-22 NOTE — Patient Instructions (Signed)
Below is our plan: ? ?We will restart topiramate 50mg  twice daily. Start with 50mg  at bedtime for 1 week then increase dose to 50mg  twice daily. Use Nurtec as needed for bad headaches. Take 75mg  daily as needed. No more than 8 tablet per month.  ? ?Please make sure you are staying well hydrated. I recommend 50-60 ounces daily. Well balanced diet and regular exercise encouraged. Consistent sleep schedule with 6-8 hours recommended.  ? ?Please continue follow up with care team as directed.  ? ?Follow up with me in 6 months  ? ?You may receive a survey regarding today's visit. I encourage you to leave honest feed back as I do use this information to improve patient care. Thank you for seeing me today!  ? ? ?

## 2021-06-22 NOTE — Progress Notes (Signed)
? ? ?Chief Complaint  ?Patient presents with  ? Follow-up  ?  Rm 8, with GF and son. Pt was last seen on 09/03/19, here to revisit for HA. Pt reports daily HA. Advil or aleve will only work for an hr. Pt no longer on topiramate, has been out of refills. Frontal or L side headaches.   ? ? ?HISTORY OF PRESENT ILLNESS: ? ?06/22/21 ALL:  ?Paul Campos is a 31 y.o. male here today for follow up for headaches. He was last seen by Dr Marjory Lies 08/2019 and started on topiramate and Nurtec. He is having daily headaches. He reports having left sided pulsating headaches about 20 days a month. Topriamate and Nurtec were effective but he has been out of medication for about a year. He is followed by hematology for sickle cell. Usually 2-3 crisis events per year, last one was yesterday.  ? ? ?HISTORY (copied from Dr Richrd Humbles previous note) ? ?31 year old male with sickle cell disease, here for evaluation of headaches. ?  ?Patient reports new onset of pulsating, throbbing and pressure headache since December 2020.  Headaches are in the frontal region but radiate to the sides of his head and back of his head.  No nausea or vomiting.  Sometimes he sees spots and blurred vision.  Headaches can last hours or days at a time.  He has 3 headaches per week.  No specific triggering factors.  He has tried Fioricet and over-the-counter medicines without relief.  Patient has family history of migraine in his mother.  No visual aura, numbness or tingling sensations. ? ? ?REVIEW OF SYSTEMS: Out of a complete 14 system review of symptoms, the patient complains only of the following symptoms, chronic pain, headaches and all other reviewed systems are negative. ? ? ?ALLERGIES: ?Allergies  ?Allergen Reactions  ? Dilaudid [Hydromorphone Hcl] Hives  ? Hydromorphone Itching  ?  Morphine okay.  ? Cefotaxime Itching and Rash  ?  Hives  ? ? ? ?HOME MEDICATIONS: ?Outpatient Medications Prior to Visit  ?Medication Sig Dispense Refill  ? folic acid  (FOLVITE) 800 MCG tablet Take 1 tablet (800 mcg total) by mouth daily. 90 tablet 3  ? hydroxyurea (HYDREA) 500 MG capsule Take 1 capsule (500 mg total) by mouth 2 (two) times daily. May take with food to minimize GI side effects. 90 capsule 2  ? ibuprofen (ADVIL) 800 MG tablet Take 1 tablet (800 mg total) by mouth every 8 (eight) hours as needed for moderate pain. 30 tablet 1  ? Oxycodone HCl 20 MG TABS Take 1 tablet (20 mg total) by mouth every 6 (six) hours as needed for up to 15 days (pain). 60 tablet 0  ? ?No facility-administered medications prior to visit.  ? ? ? ?PAST MEDICAL HISTORY: ?Past Medical History:  ?Diagnosis Date  ? Headache   ? Sickle cell anemia (HCC)   ? ? ? ?PAST SURGICAL HISTORY: ?Past Surgical History:  ?Procedure Laterality Date  ? APPENDECTOMY    ? ? ? ?FAMILY HISTORY: ?Family History  ?Problem Relation Age of Onset  ? Lung cancer Mother   ? Migraines Mother   ? ? ? ?SOCIAL HISTORY: ?Social History  ? ?Socioeconomic History  ? Marital status: Single  ?  Spouse name: Not on file  ? Number of children: 1  ? Years of education: Not on file  ? Highest education level: High school graduate  ?Occupational History  ?  Comment: none 09/03/19  ?Tobacco Use  ? Smoking status: Never  ?  Smokeless tobacco: Never  ?Vaping Use  ? Vaping Use: Never used  ?Substance and Sexual Activity  ? Alcohol use: No  ? Drug use: No  ? Sexual activity: Yes  ?  Birth control/protection: Condom  ?Other Topics Concern  ? Not on file  ?Social History Narrative  ? Caffeine- maybe coffee every other morning  ? ?Social Determinants of Health  ? ?Financial Resource Strain: Not on file  ?Food Insecurity: Not on file  ?Transportation Needs: Not on file  ?Physical Activity: Not on file  ?Stress: Not on file  ?Social Connections: Not on file  ?Intimate Partner Violence: Not on file  ? ? ? ?PHYSICAL EXAM ? ?Vitals:  ? 06/22/21 1427  ?BP: 120/80  ?Pulse: 71  ?Weight: 186 lb 6.4 oz (84.6 kg)  ?Height: 6' (1.829 m)  ? ?Body mass index  is 25.28 kg/m?. ? ?Generalized: Well developed, in no acute distress ? ?Cardiology: normal rate and rhythm, no murmur auscultated  ?Respiratory: clear to auscultation bilaterally   ? ?Neurological examination  ?Mentation: Alert oriented to time, place, history taking. Follows all commands speech and language fluent ?Cranial nerve II-XII: Pupils were equal round reactive to light. Extraocular movements were full, visual field were full on confrontational test. Facial sensation and strength were normal. Head turning and shoulder shrug  were normal and symmetric. ?Motor: The motor testing reveals 5 over 5 strength of all 4 extremities. Good symmetric motor tone is noted throughout.  ?Gait and station: Gait is normal.  ? ? ?DIAGNOSTIC DATA (LABS, IMAGING, TESTING) ?- I reviewed patient records, labs, notes, testing and imaging myself where available. ? ?Lab Results  ?Component Value Date  ? WBC 11.2 (H) 06/16/2021  ? HGB 12.6 (L) 06/16/2021  ? HCT 36.5 (L) 06/16/2021  ? MCV 75 (L) 06/16/2021  ? PLT 225 06/16/2021  ? ?   ?Component Value Date/Time  ? NA 140 06/16/2021 1249  ? K 4.3 06/16/2021 1249  ? CL 101 06/16/2021 1249  ? CO2 22 06/16/2021 1249  ? GLUCOSE 96 06/16/2021 1249  ? GLUCOSE 113 (H) 08/18/2020 2320  ? BUN 5 (L) 06/16/2021 1249  ? CREATININE 0.89 06/16/2021 1249  ? CALCIUM 9.2 06/16/2021 1249  ? PROT 7.5 06/16/2021 1249  ? ALBUMIN 4.8 06/16/2021 1249  ? AST 20 06/16/2021 1249  ? ALT 11 06/16/2021 1249  ? ALKPHOS 68 06/16/2021 1249  ? BILITOT 0.8 06/16/2021 1249  ? GFRNONAA >60 08/18/2020 2320  ? GFRAA 139 01/15/2020 1015  ? ?No results found for: CHOL, HDL, LDLCALC, LDLDIRECT, TRIG, CHOLHDL ?No results found for: HGBA1C ?No results found for: VITAMINB12 ?No results found for: TSH ? ?   ? View : No data to display.  ?  ?  ?  ? ? ? ?   ? View : No data to display.  ?  ?  ?  ? ? ? ?ASSESSMENT AND PLAN ? ?31 y.o. year old male  has a past medical history of Headache and Sickle cell anemia (HCC). here with   ? ? ?Migraine without aura and without status migrainosus, not intractable - Plan: topiramate (TOPAMAX) 50 MG tablet, Rimegepant Sulfate (NURTEC) 75 MG TBDP ? ?Khayri Tengan reports headaches have returned over the last year. He was previously doing well on topiramate and Nurtec. We will restart topiramate 50mg  BID and Nurtec as needed. Triptan avoided d/t sickle cell. Healthy lifestyle habits encouraged. he will follow up with PCP as directed. He will return to see me in 6 months,  sooner if needed. He verbalizes understanding and agreement with this plan.  ? ?No orders of the defined types were placed in this encounter. ? ? ? ?Meds ordered this encounter  ?Medications  ? topiramate (TOPAMAX) 50 MG tablet  ?  Sig: Take 1 tablet (50 mg total) by mouth 2 (two) times daily.  ?  Dispense:  180 tablet  ?  Refill:  3  ?  Order Specific Question:   Supervising Provider  ?  AnswerAnson Fret [[id number]]  ? Rimegepant Sulfate (NURTEC) 75 MG TBDP  ?  Sig: Take 75 mg by mouth daily as needed (take for abortive therapy of migraine, no more than 1 tablet in 24 hours or 10 per month).  ?  Dispense:  8 tablet  ?  Refill:  11  ?  Order Specific Question:   Supervising Provider  ?  AnswerAnson Fret [[id number]]  ? ? ? ?Shawnie Dapper, MSN, FNP-C 06/22/2021, 3:10 PM ? ?[facility] ?[address], [address] ?[place], [place] [address] ?[phone] ? ?

## 2021-06-23 ENCOUNTER — Telehealth: Payer: Self-pay | Admitting: *Deleted

## 2021-06-23 DIAGNOSIS — G43009 Migraine without aura, not intractable, without status migrainosus: Secondary | ICD-10-CM

## 2021-06-23 NOTE — Telephone Encounter (Signed)
Received denial letter for Nurtec d/t pt not having less than 15 headache days per month during the prior 6 months.  ?He reported 20 headache days. I will reach out to pt via mychart to clarify.  ?

## 2021-06-23 NOTE — Telephone Encounter (Signed)
Submitted PA Nurtec on CMM. Key: [id number]. Waiting on determination from [employer] [facility]. ?

## 2021-06-26 ENCOUNTER — Other Ambulatory Visit: Payer: Self-pay | Admitting: Family Medicine

## 2021-06-26 DIAGNOSIS — E559 Vitamin D deficiency, unspecified: Secondary | ICD-10-CM

## 2021-06-26 MED ORDER — ERGOCALCIFEROL 1.25 MG (50000 UT) PO CAPS: 50000.0000 [IU] | ORAL_CAPSULE | ORAL | 3 refills | Status: DC

## 2021-06-26 NOTE — Progress Notes (Signed)
Please inform Marlowe Lawes that vitamin D is decreased at 20.3, the goal is to be greater than 30.  Recommend starting weekly vitamin D over the next 3 months.  We will recheck his levels at follow-up appointment.  Otherwise, laboratory values consistent with his baseline.  Recommend that patient continues to hydrate consistently.  Continue both hydroxyurea and folic acid.  Also, recommend vitamin D fortified foods. ? ?Nolon Nations  APRN, MSN, FNP-C ?[facility] ?[facility] ?[address]  ?[place], [place] [address] ?[phone] ? ?

## 2021-06-26 NOTE — Progress Notes (Signed)
Meds ordered this encounter  Medications   ergocalciferol (VITAMIN D2) 1.25 MG (50000 UT) capsule    Sig: Take 1 capsule (50,000 Units total) by mouth once a week.    Dispense:  12 capsule    Refill:  3    Order Specific Question:   Supervising Provider    Answer:   [name], [name] [[id number]]   Korbyn Chopin [name] Masyn Fullam  APRN, MSN, FNP-C Patient Care Center [facility] [address]  [place], [place] [address] [phone]  

## 2021-06-30 MED ORDER — UBRELVY 100 MG PO TABS: 1.0000 | ORAL_TABLET | ORAL | 3 refills | Status: DC | PRN

## 2021-06-30 NOTE — Addendum Note (Signed)
Addended by: Wyvonnia Lora on: 06/30/2021 11:27 AM ? ? Modules accepted: Orders ? ?

## 2021-07-01 ENCOUNTER — Other Ambulatory Visit: Payer: Self-pay | Admitting: Family Medicine

## 2021-07-01 DIAGNOSIS — G8929 Other chronic pain: Secondary | ICD-10-CM

## 2021-07-01 DIAGNOSIS — D571 Sickle-cell disease without crisis: Secondary | ICD-10-CM

## 2021-07-01 MED ORDER — OXYCODONE HCL 20 MG PO TABS: 20.0000 mg | ORAL_TABLET | Freq: Four times a day (QID) | ORAL | 0 refills | Status: DC | PRN

## 2021-07-01 NOTE — Progress Notes (Signed)
Reviewed PDMP substance reporting system prior to prescribing opiate medications. No inconsistencies noted.   Meds ordered this encounter  Medications   Oxycodone HCl 20 MG TABS    Sig: Take 1 tablet (20 mg total) by mouth every 6 (six) hours as needed.    Dispense:  60 tablet    Refill:  0    Order Specific Question:   Supervising Provider    Answer:   [name], [name] [[id number]]      Paul Mattos [name] Ilay Capshaw  APRN, MSN, FNP-C Patient Care Center [facility] [address]  , [place] [address] [phone]  

## 2021-07-14 ENCOUNTER — Other Ambulatory Visit: Payer: Self-pay | Admitting: Family Medicine

## 2021-07-14 DIAGNOSIS — G8929 Other chronic pain: Secondary | ICD-10-CM

## 2021-07-14 DIAGNOSIS — D571 Sickle-cell disease without crisis: Secondary | ICD-10-CM

## 2021-07-14 MED ORDER — OXYCODONE HCL 20 MG PO TABS: 20.0000 mg | ORAL_TABLET | Freq: Four times a day (QID) | ORAL | 0 refills | Status: DC | PRN

## 2021-07-14 NOTE — Progress Notes (Signed)
Reviewed PDMP substance reporting system prior to prescribing opiate medications. No inconsistencies noted.   Meds ordered this encounter  Medications   Oxycodone HCl 20 MG TABS    Sig: Take 1 tablet (20 mg total) by mouth every 6 (six) hours as needed.    Dispense:  60 tablet    Refill:  0    Order Specific Question:   Supervising Provider    Answer:   [name], [name] [[id number]]      Paul Campos Paul Bramlett  APRN, MSN, FNP-C Patient Care Center [facility] [address]  [place], [place] [address] [phone]  

## 2021-07-27 ENCOUNTER — Other Ambulatory Visit: Payer: Self-pay | Admitting: Family Medicine

## 2021-07-27 DIAGNOSIS — D571 Sickle-cell disease without crisis: Secondary | ICD-10-CM

## 2021-07-27 DIAGNOSIS — G8929 Other chronic pain: Secondary | ICD-10-CM

## 2021-07-27 MED ORDER — OXYCODONE HCL 20 MG PO TABS: 20.0000 mg | ORAL_TABLET | Freq: Four times a day (QID) | ORAL | 0 refills | Status: DC | PRN

## 2021-07-27 NOTE — Progress Notes (Signed)
Reviewed PDMP substance reporting system prior to prescribing opiate medications. No inconsistencies noted.   Meds ordered this encounter  Medications   Oxycodone HCl 20 MG TABS    Sig: Take 1 tablet (20 mg total) by mouth every 6 (six) hours as needed.    Dispense:  60 tablet    Refill:  0    Order Specific Question:   Supervising Provider    Answer:   [name], [name] [[id number]]      Paul Kring [name] Shimshon Narula  APRN, MSN, FNP-C [facility] [address]  [place], [place] [address] [phone]  

## 2021-08-17 ENCOUNTER — Other Ambulatory Visit: Payer: Self-pay | Admitting: Family Medicine

## 2021-08-17 DIAGNOSIS — G8929 Other chronic pain: Secondary | ICD-10-CM

## 2021-08-17 DIAGNOSIS — D571 Sickle-cell disease without crisis: Secondary | ICD-10-CM

## 2021-08-17 MED ORDER — OXYCODONE HCL 20 MG PO TABS: 20.0000 mg | ORAL_TABLET | Freq: Four times a day (QID) | ORAL | 0 refills | Status: DC | PRN

## 2021-08-31 ENCOUNTER — Other Ambulatory Visit: Payer: Self-pay | Admitting: Family Medicine

## 2021-08-31 DIAGNOSIS — G8929 Other chronic pain: Secondary | ICD-10-CM

## 2021-08-31 DIAGNOSIS — D571 Sickle-cell disease without crisis: Secondary | ICD-10-CM

## 2021-08-31 MED ORDER — OXYCODONE HCL 20 MG PO TABS: 20.0000 mg | ORAL_TABLET | Freq: Four times a day (QID) | ORAL | 0 refills | Status: DC | PRN

## 2021-08-31 NOTE — Progress Notes (Signed)
Reviewed PDMP substance reporting system prior to prescribing opiate medications. No inconsistencies noted.   Meds ordered this encounter  Medications   Oxycodone HCl 20 MG TABS    Sig: Take 1 tablet (20 mg total) by mouth every 6 (six) hours as needed.    Dispense:  60 tablet    Refill:  0    Order Specific Question:   Supervising Provider    Answer:   [name], [name] [[id number]]      Paul Forney [name] Grabiel Schmutz  APRN, MSN, FNP-C Patient Care Center [facility] [address]  [place], [place] [address] [phone]  

## 2021-09-09 ENCOUNTER — Telehealth: Payer: Self-pay | Admitting: Family Medicine

## 2021-09-09 NOTE — Telephone Encounter (Signed)
Radiology department from [facility] called stating pt had a chest CT on 08/09/21 at [facility] ER. It showed lung opacities and lymph nodes lymphadenopathy  Reccomending a 6-12 month follow up chest CT

## 2021-09-11 ENCOUNTER — Other Ambulatory Visit: Payer: Self-pay | Admitting: Family Medicine

## 2021-09-11 DIAGNOSIS — G8929 Other chronic pain: Secondary | ICD-10-CM

## 2021-09-11 DIAGNOSIS — D571 Sickle-cell disease without crisis: Secondary | ICD-10-CM

## 2021-09-11 MED ORDER — OXYCODONE HCL 20 MG PO TABS: 20.0000 mg | ORAL_TABLET | Freq: Four times a day (QID) | ORAL | 0 refills | Status: DC | PRN

## 2021-09-11 NOTE — Progress Notes (Signed)
Reviewed PDMP substance reporting system prior to prescribing opiate medications. No inconsistencies noted.   Meds ordered this encounter  Medications   Oxycodone HCl 20 MG TABS    Sig: Take 1 tablet (20 mg total) by mouth every 6 (six) hours as needed.    Dispense:  60 tablet    Refill:  0    Order Specific Question:   Supervising Provider    Answer:   [name], [name] [[id number]]      Norma Ignasiak [name] Aoife Bold  APRN, MSN, FNP-C Patient Care Center [facility] [address]  [place], [place] [address] [phone]  

## 2021-09-22 ENCOUNTER — Ambulatory Visit: Payer: Medicaid Other | Admitting: Family Medicine

## 2021-09-22 VITALS — BP 121/82 | HR 96 | Temp 97.5°F | Resp 18 | Ht 72.0 in | Wt 179.8 lb

## 2021-09-22 DIAGNOSIS — G8929 Other chronic pain: Secondary | ICD-10-CM

## 2021-09-22 DIAGNOSIS — R82998 Other abnormal findings in urine: Secondary | ICD-10-CM

## 2021-09-22 DIAGNOSIS — E559 Vitamin D deficiency, unspecified: Secondary | ICD-10-CM

## 2021-09-22 DIAGNOSIS — D571 Sickle-cell disease without crisis: Secondary | ICD-10-CM

## 2021-09-22 LAB — POCT URINALYSIS DIP (CLINITEK)
Bilirubin, UA: NEGATIVE
Blood, UA: NEGATIVE
Glucose, UA: NEGATIVE mg/dL
Ketones, POC UA: NEGATIVE mg/dL
Nitrite, UA: NEGATIVE
POC PROTEIN,UA: NEGATIVE
Spec Grav, UA: 1.015 (ref 1.010–1.025)
Urobilinogen, UA: 0.2 E.U./dL
pH, UA: 6.5 (ref 5.0–8.0)

## 2021-09-23 ENCOUNTER — Encounter: Payer: Self-pay | Admitting: Family Medicine

## 2021-09-23 LAB — CMP14+CBC/D/PLT+FER+RETIC+V...
ALT: 21 IU/L (ref 0–44)
AST: 36 IU/L (ref 0–40)
Albumin/Globulin Ratio: 1.4 (ref 1.2–2.2)
Albumin: 5 g/dL (ref 4.3–5.2)
Alkaline Phosphatase: 85 IU/L (ref 44–121)
BUN/Creatinine Ratio: 15 (ref 9–20)
BUN: 14 mg/dL (ref 6–20)
Basophils Absolute: 0.1 10*3/uL (ref 0.0–0.2)
Basos: 1 %
Bilirubin Total: 1.1 mg/dL (ref 0.0–1.2)
CO2: 18 mmol/L — ABNORMAL LOW (ref 20–29)
Calcium: 9.8 mg/dL (ref 8.7–10.2)
Chloride: 104 mmol/L (ref 96–106)
Creatinine, Ser: 0.95 mg/dL (ref 0.76–1.27)
EOS (ABSOLUTE): 0.5 10*3/uL — ABNORMAL HIGH (ref 0.0–0.4)
Eos: 4 %
Ferritin: 1358 ng/mL — ABNORMAL HIGH (ref 30–400)
Globulin, Total: 3.5 g/dL (ref 1.5–4.5)
Glucose: 96 mg/dL (ref 70–99)
Hematocrit: 35 % — ABNORMAL LOW (ref 37.5–51.0)
Hemoglobin: 11.5 g/dL — ABNORMAL LOW (ref 13.0–17.7)
Immature Grans (Abs): 0 10*3/uL (ref 0.0–0.1)
Immature Granulocytes: 0 %
Lymphocytes Absolute: 2.2 10*3/uL (ref 0.7–3.1)
Lymphs: 17 %
MCH: 25.4 pg — ABNORMAL LOW (ref 26.6–33.0)
MCHC: 32.9 g/dL (ref 31.5–35.7)
MCV: 77 fL — ABNORMAL LOW (ref 79–97)
Monocytes Absolute: 2.3 10*3/uL — ABNORMAL HIGH (ref 0.1–0.9)
Monocytes: 18 %
Neutrophils Absolute: 7.9 10*3/uL — ABNORMAL HIGH (ref 1.4–7.0)
Neutrophils: 60 %
Platelets: 182 10*3/uL (ref 150–450)
Potassium: 5 mmol/L (ref 3.5–5.2)
RBC: 4.52 x10E6/uL (ref 4.14–5.80)
RDW: 19.6 % — ABNORMAL HIGH (ref 11.6–15.4)
Retic Ct Pct: 4.2 % — ABNORMAL HIGH (ref 0.6–2.6)
Sodium: 138 mmol/L (ref 134–144)
Total Protein: 8.5 g/dL (ref 6.0–8.5)
Vit D, 25-Hydroxy: 20.1 ng/mL — ABNORMAL LOW (ref 30.0–100.0)
WBC: 13.1 10*3/uL — ABNORMAL HIGH (ref 3.4–10.8)
eGFR: 111 mL/min/{1.73_m2} (ref >59)

## 2021-09-23 NOTE — Progress Notes (Signed)
Patient [facility]    Subjective   Patient ID: Paul Campos, male    DOB: November 11, 1990  Age: 31 y.o. MRN: [id number]  Chief Complaint  Patient presents with   Follow-up    Pt  39mh f/u  Pt has a new med for migraines Nurtec.    Paul Campos a 31year old male with a medical history significant for sickle cell disease, chronic pain syndrome, opiate dependence and tolerance, and history of anemia of chronic disease that presents for 320-monthollow-up of his chronic conditions.  Patient states that he has been doing well over the past several weeks and is without complaint.  He was last hospitalized on 08/07/2021 for sickle cell pain crisis at [facility] He says that he has been doing well since discharge.  Patient's chronic pain is well controlled on current medication regimen.  He is not having any pain on today.  Patient also denies shortness of breath, fever, chills, chest pain.  No urinary symptoms, nausea, vomiting, or diarrhea.    Patient Active Problem List   Diagnosis Date Noted   Elevated troponin 10/20/2020   Leukocytosis 05/07/2020   Chronic pain syndrome 05/07/2020   Acute sickle cell crisis (HCPat[place]03/15/2022   Nausea & vomiting 11/05/2018   Sickle cell anemia with crisis (HCAul[place]09/01/2019   Hb-SS disease without crisis (HCPioneer11/12/2017   Acute pain of left shoulder 07/29/2016   Migraine without aura and without status migrainosus, not intractable 11/04/2015   Episodic headache 07/17/2015   Fever 10/01/2012   Sepsis (HCCoopers Plains08/11/2012   Anemia 09/30/2012   Hypokalemia 08/21/2012   Thrombocytopenia, unspecified (HCUtica06/28/2014   CAP (community acquired pneumonia) 08/18/2012   Pneumonia, unspecified organism 08/18/2012   Thrombocytopenia (HCShannon04/21/2014   Sickle cell pain crisis (HCTerrebonne03/15/2014   Chest pain 05/06/2012   Vitamin D deficiency 11/02/2011   Cholelithiasis 10/14/2011   Hereditary persistence of  fetal hemoglobin (HCKalaheo08/22/2013   Splenomegaly 10/14/2011   Chronic pain 10/14/2011   Sickle cell anemia (HCRoyston08/22/2013   Past Medical History:  Diagnosis Date   Headache    Sickle cell anemia (HCC)    Past Surgical History:  Procedure Laterality Date   APPENDECTOMY     Social History   Tobacco Use   Smoking status: Never   Smokeless tobacco: Never  Vaping Use   Vaping Use: Never used  Substance Use Topics   Alcohol use: No   Drug use: No   Social History   Socioeconomic History   Marital status: Single    Spouse name: Not on file   Number of children: 1   Years of education: Not on file   Highest education level: High school graduate  Occupational History    Comment: none 09/03/19  Tobacco Use   Smoking status: Never   Smokeless tobacco: Never  Vaping Use   Vaping Use: Never used  Substance and Sexual Activity   Alcohol use: No   Drug use: No   Sexual activity: Yes    Birth control/protection: Condom  Other Topics Concern   Not on file  Social History Narrative   Caffeine- maybe coffee every other morning   Social Determinants of Health   Financial Resource Strain: Not on file  Food Insecurity: Not on file  Transportation Needs: Not on file  Physical Activity: Not on file  Stress: Not on file  Social Connections: Not on file  Intimate Partner Violence: Not on  file   Family Status  Relation Name Status   Mother  Deceased   Father  Alive   Sister  Alive   Brother  Alive   Brother  Alive   Brother  Alive   Brother  Alive   MGM  Alive   MGF  Deceased   Hume  Deceased   PGF  Deceased   Family History  Problem Relation Age of Onset   Lung cancer Mother    Migraines Mother    Allergies  Allergen Reactions   Dilaudid [Hydromorphone Hcl] Hives   Hydromorphone Itching    Morphine okay.   Cefotaxime Itching and Rash    Hives      Review of Systems  Constitutional: Negative.   HENT: Negative.    Eyes: Negative.   Respiratory:  Negative.    Gastrointestinal: Negative.   Genitourinary: Negative.   Musculoskeletal: Negative.   Skin: Negative.   Neurological: Negative.   Endo/Heme/Allergies: Negative.   Psychiatric/Behavioral: Negative.        Objective:     BP 121/82 (BP Location: Left Arm, Patient Position: Sitting, Cuff Size: Normal)   Pulse 96   Temp (!) 97.5 F (36.4 C)   Resp 18   Ht 6' (1.829 m)   Wt 179 lb 12.8 oz (81.6 kg)   SpO2 100%   BMI 24.39 kg/m  BP Readings from Last 3 Encounters:  09/22/21 121/82  06/22/21 120/80  06/16/21 120/84   Wt Readings from Last 3 Encounters:  09/22/21 179 lb 12.8 oz (81.6 kg)  06/22/21 186 lb 6.4 oz (84.6 kg)  06/16/21 182 lb (82.6 kg)      Physical Exam Constitutional:      Appearance: Normal appearance.  Cardiovascular:     Rate and Rhythm: Normal rate and regular rhythm.     Pulses: Normal pulses.  Pulmonary:     Effort: Pulmonary effort is normal.  Abdominal:     General: Bowel sounds are normal.  Musculoskeletal:        General: Normal range of motion.  Skin:    General: Skin is warm.  Neurological:     General: No focal deficit present.     Mental Status: He is alert. Mental status is at baseline.  Psychiatric:        Mood and Affect: Mood normal.        Behavior: Behavior normal.        Thought Content: Thought content normal.        Judgment: Judgment normal.      Results for orders placed or performed in visit on 09/22/21  [id number] 11+Oxyco+Alc+Crt-Bund  Result Value Ref Range   Ethanol WILL FOLLOW    Amphetamines, Urine WILL FOLLOW    Barbiturate WILL FOLLOW    BENZODIAZ UR QL WILL FOLLOW    Cannabinoid Quant, Ur WILL FOLLOW    Cocaine (Metabolite) WILL FOLLOW    OPIATE SCREEN URINE WILL FOLLOW    Oxycodone/Oxymorphone, Urine WILL FOLLOW    OXYCODONE/OXYMORPH WILL FOLLOW    OXYCODONE WILL FOLLOW    OXYCODONE WILL FOLLOW    OXYMORPHONE WILL FOLLOW    OXYMORPHONE (GC/MS) WILL FOLLOW    Phencyclidine WILL FOLLOW     Methadone Screen, Urine WILL FOLLOW    Propoxyphene WILL FOLLOW    Meperidine WILL FOLLOW    Tramadol WILL FOLLOW    Creatinine WILL FOLLOW    pH, Urine WILL FOLLOW   Sickle Cell Panel  Result Value Ref Range  Glucose 96 70 - 99 mg/dL   BUN 14 6 - 20 mg/dL   Creatinine, Ser 0.95 0.76 - 1.27 mg/dL   eGFR 111 >59 mL/min/1.73   BUN/Creatinine Ratio 15 9 - 20   Sodium 138 134 - 144 mmol/L   Potassium 5.0 3.5 - 5.2 mmol/L   Chloride 104 96 - 106 mmol/L   CO2 18 (L) 20 - 29 mmol/L   Calcium 9.8 8.7 - 10.2 mg/dL   Total Protein 8.5 6.0 - 8.5 g/dL   Albumin 5.0 4.3 - 5.2 g/dL   Globulin, Total 3.5 1.5 - 4.5 g/dL   Albumin/Globulin Ratio 1.4 1.2 - 2.2   Bilirubin Total 1.1 0.0 - 1.2 mg/dL   Alkaline Phosphatase 85 44 - 121 IU/L   AST 36 0 - 40 IU/L   ALT 21 0 - 44 IU/L   Ferritin 1,358 (H) 30 - 400 ng/mL   Vit D, 25-Hydroxy 20.1 (L) 30.0 - 100.0 ng/mL   WBC 13.1 (H) 3.4 - 10.8 x10E3/uL   RBC 4.52 4.14 - 5.80 x10E6/uL   Hemoglobin 11.5 (L) 13.0 - 17.7 g/dL   Hematocrit 35.0 (L) 37.5 - 51.0 %   MCV 77 (L) 79 - 97 fL   MCH 25.4 (L) 26.6 - 33.0 pg   MCHC 32.9 31.5 - 35.7 g/dL   RDW 19.6 (H) 11.6 - 15.4 %   Platelets 182 150 - 450 x10E3/uL   Neutrophils 60 Not Estab. %   Lymphs 17 Not Estab. %   Monocytes 18 Not Estab. %   Eos 4 Not Estab. %   Basos 1 Not Estab. %   Neutrophils Absolute 7.9 (H) 1.4 - 7.0 x10E3/uL   Lymphocytes Absolute 2.2 0.7 - 3.1 x10E3/uL   Monocytes Absolute 2.3 (H) 0.1 - 0.9 x10E3/uL   EOS (ABSOLUTE) 0.5 (H) 0.0 - 0.4 x10E3/uL   Basophils Absolute 0.1 0.0 - 0.2 x10E3/uL   Immature Granulocytes 0 Not Estab. %   Immature Grans (Abs) 0.0 0.0 - 0.1 x10E3/uL   Retic Ct Pct 4.2 (H) 0.6 - 2.6 %  POCT URINALYSIS DIP (CLINITEK)  Result Value Ref Range   Color, UA yellow yellow   Clarity, UA clear clear   Glucose, UA negative negative mg/dL   Bilirubin, UA negative negative   Ketones, POC UA negative negative mg/dL   Spec Grav, UA 1.015 1.010 - 1.025   Blood,  UA negative negative   pH, UA 6.5 5.0 - 8.0   POC PROTEIN,UA negative negative, trace   Urobilinogen, UA 0.2 0.2 or 1.0 E.U./dL   Nitrite, UA Negative Negative   Leukocytes, UA Small (1+) (A) Negative    Last CBC Lab Results  Component Value Date   WBC 13.1 (H) 09/22/2021   HGB 11.5 (L) 09/22/2021   HCT 35.0 (L) 09/22/2021   MCV 77 (L) 09/22/2021   MCH 25.4 (L) 09/22/2021   RDW 19.6 (H) 09/22/2021   PLT 182 63/02/6008   Last metabolic panel Lab Results  Component Value Date   GLUCOSE 96 09/22/2021   NA 138 09/22/2021   K 5.0 09/22/2021   CL 104 09/22/2021   CO2 18 (L) 09/22/2021   BUN 14 09/22/2021   CREATININE 0.95 09/22/2021   EGFR 111 09/22/2021   CALCIUM 9.8 09/22/2021   PROT 8.5 09/22/2021   ALBUMIN 5.0 09/22/2021   LABGLOB 3.5 09/22/2021   AGRATIO 1.4 09/22/2021   BILITOT 1.1 09/22/2021   ALKPHOS 85 09/22/2021   AST 36 09/22/2021   ALT  21 09/22/2021   ANIONGAP 7 08/18/2020   Last lipids No results found for: "CHOL", "HDL", "LDLCALC", "LDLDIRECT", "TRIG", "CHOLHDL" Last hemoglobin A1c No results found for: "HGBA1C" Last thyroid functions No results found for: "TSH", "T3TOTAL", "T4TOTAL", "THYROIDAB" Last vitamin D Lab Results  Component Value Date   VD25OH 20.1 (L) 09/22/2021   Last vitamin B12 and Folate No results found for: "VITAMINB12", "FOLATE"    The ASCVD Risk score (Arnett DK, et al., 2019) failed to calculate for the following reasons:   The 2019 ASCVD risk score is only valid for ages 95 to 42    Assessment & Plan:   Problem List Items Addressed This Visit       Other   Vitamin D deficiency   Relevant Orders   Sickle Cell Panel (Completed)   Hb-SS disease without crisis ([place]) - Primary   Relevant Orders   POCT URINALYSIS DIP (CLINITEK) (Completed)   Urine Culture   Sickle Cell Panel (Completed)   Chronic pain   Relevant Orders   [id number] 11+Oxyco+Alc+Crt-Bund (Completed)   There will be no medication changes for Mr. Klausing on  today.  He is doing well on current medication regimen.  No hematology referral at this time.  We will continue hydroxyurea and folic acid.  Discussed hydrating consistently with 64 ounces of fluids per day. Urine leukocytes present, will add urine culture for review.   Return in about 3 months (around 12/23/2021).   Donia Pounds  APRN, MSN, FNP-C [facility] [address] [place], [place] [address] [phone]

## 2021-09-24 LAB — URINE CULTURE

## 2021-09-25 LAB — DRUG SCREEN 764883 11+OXYCO+ALC+CRT-BUND
Amphetamines, Urine: NEGATIVE ng/mL
BENZODIAZ UR QL: NEGATIVE ng/mL
Barbiturate: NEGATIVE ng/mL
Cannabinoid Quant, Ur: NEGATIVE ng/mL
Cocaine (Metabolite): NEGATIVE ng/mL
Creatinine: 112.9 mg/dL (ref 20.0–300.0)
Ethanol: NEGATIVE %
Meperidine: NEGATIVE ng/mL
Methadone Screen, Urine: NEGATIVE ng/mL
OPIATE SCREEN URINE: NEGATIVE ng/mL
Phencyclidine: NEGATIVE ng/mL
Propoxyphene: NEGATIVE ng/mL
Tramadol: NEGATIVE ng/mL
pH, Urine: 6.2 (ref 4.5–8.9)

## 2021-09-25 LAB — OXYCODONE/OXYMORPHONE, CONFIRM
OXYCODONE/OXYMORPH: POSITIVE — AB
OXYCODONE: NEGATIVE
OXYMORPHONE (GC/MS): 627 ng/mL
OXYMORPHONE: POSITIVE — AB

## 2021-09-29 ENCOUNTER — Other Ambulatory Visit: Payer: Self-pay | Admitting: Family Medicine

## 2021-09-29 DIAGNOSIS — D571 Sickle-cell disease without crisis: Secondary | ICD-10-CM

## 2021-09-29 DIAGNOSIS — G8929 Other chronic pain: Secondary | ICD-10-CM

## 2021-09-29 MED ORDER — OXYCODONE HCL 20 MG PO TABS: 20.0000 mg | ORAL_TABLET | Freq: Four times a day (QID) | ORAL | 0 refills | Status: DC | PRN

## 2021-09-29 NOTE — Progress Notes (Signed)
Reviewed PDMP substance reporting system prior to prescribing opiate medications. No inconsistencies noted.   Meds ordered this encounter  Medications   Oxycodone HCl 20 MG TABS    Sig: Take 1 tablet (20 mg total) by mouth every 6 (six) hours as needed.    Dispense:  60 tablet    Refill:  0    Order Specific Question:   Supervising Provider    Answer:   [name], [name] [[id number]]      Tin Engram [name] Emilianna Barlowe  APRN, MSN, FNP-C [facility] [address]  [place], [place] [address] [phone]  

## 2021-10-15 ENCOUNTER — Other Ambulatory Visit: Payer: Self-pay | Admitting: Internal Medicine

## 2021-10-15 DIAGNOSIS — G8929 Other chronic pain: Secondary | ICD-10-CM

## 2021-10-15 DIAGNOSIS — D571 Sickle-cell disease without crisis: Secondary | ICD-10-CM

## 2021-10-15 MED ORDER — OXYCODONE HCL 20 MG PO TABS: 20.0000 mg | ORAL_TABLET | Freq: Four times a day (QID) | ORAL | 0 refills | Status: AC | PRN

## 2021-10-29 ENCOUNTER — Other Ambulatory Visit: Payer: Self-pay | Admitting: Family Medicine

## 2021-10-29 DIAGNOSIS — D571 Sickle-cell disease without crisis: Secondary | ICD-10-CM

## 2021-10-29 DIAGNOSIS — G8929 Other chronic pain: Secondary | ICD-10-CM

## 2021-10-29 NOTE — Telephone Encounter (Signed)
Last refill 10/15/21, #60, 0 refills Sig: Take 1 tablet (20 mg total) by mouth every 6 (six) hours as needed for up to 15 days.  Please review refill request. Thanks!

## 2021-10-30 ENCOUNTER — Other Ambulatory Visit: Payer: Self-pay | Admitting: Family Medicine

## 2021-11-02 ENCOUNTER — Other Ambulatory Visit: Payer: Self-pay | Admitting: Family Medicine

## 2021-11-02 ENCOUNTER — Telehealth: Payer: Self-pay

## 2021-11-02 DIAGNOSIS — G8929 Other chronic pain: Secondary | ICD-10-CM

## 2021-11-02 DIAGNOSIS — D571 Sickle-cell disease without crisis: Secondary | ICD-10-CM

## 2021-11-02 MED ORDER — OXYCODONE HCL 20 MG PO TABS: 20.0000 mg | ORAL_TABLET | Freq: Four times a day (QID) | ORAL | 0 refills | Status: DC | PRN

## 2021-11-02 NOTE — Progress Notes (Signed)
Reviewed PDMP substance reporting system prior to prescribing opiate medications. No inconsistencies noted.   Meds ordered this encounter  Medications   Oxycodone HCl 20 MG TABS    Sig: Take 1 tablet (20 mg total) by mouth every 6 (six) hours as needed.    Dispense:  60 tablet    Refill:  0    Order Specific Question:   Supervising Provider    Answer:   [name], [name] [[id number]]      Eimi Viney [name] Arthelia Callicott  APRN, MSN, FNP-C [facility] [address]  , [place] [address] [phone]  

## 2021-11-02 NOTE — Telephone Encounter (Signed)
PA has been submitted for Oxycodone. PA case ID: [id number] KEY: [id number].  Waiting on ins decision.

## 2021-11-02 NOTE — Telephone Encounter (Signed)
PA approved until 05/01/2022.  [facility] has been notified and claim processed.

## 2021-11-16 ENCOUNTER — Other Ambulatory Visit: Payer: Self-pay | Admitting: Family Medicine

## 2021-11-16 DIAGNOSIS — G8929 Other chronic pain: Secondary | ICD-10-CM

## 2021-11-16 DIAGNOSIS — D571 Sickle-cell disease without crisis: Secondary | ICD-10-CM

## 2021-11-16 MED ORDER — OXYCODONE HCL 20 MG PO TABS: 20.0000 mg | ORAL_TABLET | Freq: Four times a day (QID) | ORAL | 0 refills | Status: DC | PRN

## 2021-11-16 NOTE — Progress Notes (Signed)
Reviewed PDMP substance reporting system prior to prescribing opiate medications. No inconsistencies noted.   Meds ordered this encounter  Medications   Oxycodone HCl 20 MG TABS    Sig: Take 1 tablet (20 mg total) by mouth every 6 (six) hours as needed.    Dispense:  60 tablet    Refill:  0    Order Specific Question:   Supervising Provider    Answer:   [name], [name] [[id number]]      Wayburn Shaler [name] Seniah Lawrence  APRN, MSN, FNP-C [facility] [address]  [place], [place] [address] [phone]  

## 2021-12-01 ENCOUNTER — Other Ambulatory Visit: Payer: Self-pay | Admitting: Family Medicine

## 2021-12-01 DIAGNOSIS — D571 Sickle-cell disease without crisis: Secondary | ICD-10-CM

## 2021-12-01 DIAGNOSIS — G8929 Other chronic pain: Secondary | ICD-10-CM

## 2021-12-01 MED ORDER — OXYCODONE HCL 20 MG PO TABS: 20.0000 mg | ORAL_TABLET | Freq: Four times a day (QID) | ORAL | 0 refills | Status: DC | PRN

## 2021-12-01 NOTE — Progress Notes (Signed)
Reviewed PDMP substance reporting system prior to prescribing opiate medications. No inconsistencies noted.   Meds ordered this encounter  Medications   Oxycodone HCl 20 MG TABS    Sig: Take 1 tablet (20 mg total) by mouth every 6 (six) hours as needed.    Dispense:  60 tablet    Refill:  0    Order Specific Question:   Supervising Provider    Answer:   [name], [name] [[id number]]      Jannet Calip [name] Trenika Hudson  APRN, MSN, FNP-C [facility] [address]  [place], [place] [address] [phone]  

## 2021-12-15 ENCOUNTER — Other Ambulatory Visit: Payer: Self-pay | Admitting: Family Medicine

## 2021-12-15 DIAGNOSIS — G8929 Other chronic pain: Secondary | ICD-10-CM

## 2021-12-15 DIAGNOSIS — D571 Sickle-cell disease without crisis: Secondary | ICD-10-CM

## 2021-12-15 MED ORDER — OXYCODONE HCL 20 MG PO TABS: 20.0000 mg | ORAL_TABLET | Freq: Four times a day (QID) | ORAL | 0 refills | Status: DC | PRN

## 2021-12-15 NOTE — Telephone Encounter (Signed)
Reviewed PDMP substance reporting system prior to prescribing opiate medications. No inconsistencies noted.   Meds ordered this encounter  Medications   Oxycodone HCl 20 MG TABS    Sig: Take 1 tablet (20 mg total) by mouth every 6 (six) hours as needed.    Dispense:  60 tablet    Refill:  0    Order Specific Question:   Supervising Provider    Answer:   [name], [name] [[id number]]      Paul Campos Paul Lambert  APRN, MSN, FNP-C [facility] [address]  [place], [place] [address] [phone]  

## 2021-12-17 NOTE — Progress Notes (Deleted)
No chief complaint on file.   HISTORY OF PRESENT ILLNESS:  12/17/21 ALL:  Paul Campos returns for follow up for headaches. He was last seen 06/2021 and reported worsening headaches over the past year. He had ran out of medications. We restarted topiramate 50mg  BID and Nurtec as needed.   06/22/2021 ALL: Paul Campos is a 31 y.o. male here today for follow up for headaches. He was last seen by Dr Leta Baptist 08/2019 and started on topiramate and Nurtec. He is having daily headaches. He reports having left sided pulsating headaches about 20 days a month. Topriamate and Nurtec were effective but he has been out of medication for about a year. He is followed by hematology for sickle cell. Usually 2-3 crisis events per year, last one was yesterday.   HISTORY (copied from Dr Gladstone Lighter previous note)  31 year old male with sickle cell disease, here for evaluation of headaches.   Patient reports new onset of pulsating, throbbing and pressure headache since December 2020.  Headaches are in the frontal region but radiate to the sides of his head and back of his head.  No nausea or vomiting.  Sometimes he sees spots and blurred vision.  Headaches can last hours or days at a time.  He has 3 headaches per week.  No specific triggering factors.  He has tried Fioricet and over-the-counter medicines without relief.  Patient has family history of migraine in his mother.  No visual aura, numbness or tingling sensations.   REVIEW OF SYSTEMS: Out of a complete 14 system review of symptoms, the patient complains only of the following symptoms, chronic pain, headaches and all other reviewed systems are negative.   ALLERGIES: Allergies  Allergen Reactions   Dilaudid [Hydromorphone Hcl] Hives   Hydromorphone Itching    Morphine okay.   Cefotaxime Itching and Rash    Hives     HOME MEDICATIONS: Outpatient Medications Prior to Visit  Medication Sig Dispense Refill   ergocalciferol (VITAMIN D2) 1.25 MG (50000  UT) capsule Take 1 capsule (50,000 Units total) by mouth once a week. (Patient not taking: Reported on 09/22/2021) 12 capsule 3   folic acid (FOLVITE) 902 MCG tablet Take 1 tablet (800 mcg total) by mouth daily. 90 tablet 3   hydroxyurea (HYDREA) 500 MG capsule Take 1 capsule (500 mg total) by mouth 2 (two) times daily. May take with food to minimize GI side effects. 90 capsule 2   ibuprofen (ADVIL) 800 MG tablet Take 1 tablet (800 mg total) by mouth every 8 (eight) hours as needed for moderate pain. 30 tablet 1   Oxycodone HCl 20 MG TABS Take 1 tablet (20 mg total) by mouth every 6 (six) hours as needed. 60 tablet 0   topiramate (TOPAMAX) 50 MG tablet Take 1 tablet (50 mg total) by mouth 2 (two) times daily. 180 tablet 3   Ubrogepant (UBRELVY) 100 MG TABS Take 1 tablet by mouth as needed. At onset of migraine. Take orally, with or without food. A second dose can be taken at least 2 hours after the initial dose, if needed. The maximum daily dose is 200 mg (2 tablets). No more than 16 tabs per month. (Patient not taking: Reported on 09/22/2021) 16 tablet 3   No facility-administered medications prior to visit.     PAST MEDICAL HISTORY: Past Medical History:  Diagnosis Date   Headache    Sickle cell anemia ([place])      PAST SURGICAL HISTORY: Past Surgical History:  Procedure Laterality Date  APPENDECTOMY       FAMILY HISTORY: Family History  Problem Relation Age of Onset   Lung cancer Mother    Migraines Mother      SOCIAL HISTORY: Social History   Socioeconomic History   Marital status: Single    Spouse name: Not on file   Number of children: 1   Years of education: Not on file   Highest education level: High school graduate  Occupational History    Comment: none 09/03/19  Tobacco Use   Smoking status: Never   Smokeless tobacco: Never  Vaping Use   Vaping Use: Never used  Substance and Sexual Activity   Alcohol use: No   Drug use: No   Sexual activity: Yes    Birth  control/protection: Condom  Other Topics Concern   Not on file  Social History Narrative   Caffeine- maybe coffee every other morning   Social Determinants of Health   Financial Resource Strain: Not on file  Food Insecurity: Not on file  Transportation Needs: Not on file  Physical Activity: Not on file  Stress: Not on file  Social Connections: Not on file  Intimate Partner Violence: Not on file     PHYSICAL EXAM  There were no vitals filed for this visit.  There is no height or weight on file to calculate BMI.  Generalized: Well developed, in no acute distress  Cardiology: normal rate and rhythm, no murmur auscultated  Respiratory: clear to auscultation bilaterally    Neurological examination  Mentation: Alert oriented to time, place, history taking. Follows all commands speech and language fluent Cranial nerve II-XII: Pupils were equal round reactive to light. Extraocular movements were full, visual field were full on confrontational test. Facial sensation and strength were normal. Head turning and shoulder shrug  were normal and symmetric. Motor: The motor testing reveals 5 over 5 strength of all 4 extremities. Good symmetric motor tone is noted throughout.  Gait and station: Gait is normal.    DIAGNOSTIC DATA (LABS, IMAGING, TESTING) - I reviewed patient records, labs, notes, testing and imaging myself where available.  Lab Results  Component Value Date   WBC 13.1 (H) 09/22/2021   HGB 11.5 (L) 09/22/2021   HCT 35.0 (L) 09/22/2021   MCV 77 (L) 09/22/2021   PLT 182 09/22/2021      Component Value Date/Time   NA 138 09/22/2021 1255   K 5.0 09/22/2021 1255   CL 104 09/22/2021 1255   CO2 18 (L) 09/22/2021 1255   GLUCOSE 96 09/22/2021 1255   GLUCOSE 113 (H) 08/18/2020 2320   BUN 14 09/22/2021 1255   CREATININE 0.95 09/22/2021 1255   CALCIUM 9.8 09/22/2021 1255   PROT 8.5 09/22/2021 1255   ALBUMIN 5.0 09/22/2021 1255   AST 36 09/22/2021 1255   ALT 21  09/22/2021 1255   ALKPHOS 85 09/22/2021 1255   BILITOT 1.1 09/22/2021 1255   GFRNONAA >60 08/18/2020 2320   GFRAA 139 01/15/2020 1015   No results found for: "CHOL", "HDL", "LDLCALC", "LDLDIRECT", "TRIG", "CHOLHDL" No results found for: "HGBA1C" No results found for: "VITAMINB12" No results found for: "TSH"      No data to display               No data to display           ASSESSMENT AND PLAN  31 y.o. year old male  has a past medical history of Headache and Sickle cell anemia (HCC). here with  No diagnosis found.  Paul Campos reports headaches have returned over the last year. He was previously doing well on topiramate and Nurtec. We will restart topiramate 50mg  BID and Nurtec as needed. Triptan avoided d/t sickle cell. Healthy lifestyle habits encouraged. he will follow up with PCP as directed. He will return to see me in 6 months, sooner if needed. He verbalizes understanding and agreement with this plan.   No orders of the defined types were placed in this encounter.    No orders of the defined types were placed in this encounter.    , MSN, FNP-C 12/17/2021, 9:39 AM  [facility] [address] [place], [place] [phone]

## 2021-12-17 NOTE — Patient Instructions (Incomplete)

## 2021-12-24 ENCOUNTER — Encounter: Payer: Self-pay | Admitting: Family Medicine

## 2021-12-24 ENCOUNTER — Ambulatory Visit: Payer: Medicaid Other | Admitting: Family Medicine

## 2021-12-24 DIAGNOSIS — G43009 Migraine without aura, not intractable, without status migrainosus: Secondary | ICD-10-CM

## 2021-12-29 ENCOUNTER — Encounter: Payer: Self-pay | Admitting: Family Medicine

## 2021-12-29 ENCOUNTER — Ambulatory Visit: Payer: Medicaid Other | Admitting: Family Medicine

## 2021-12-29 VITALS — BP 111/71 | HR 69 | Temp 97.6°F | Ht 72.0 in | Wt 177.6 lb

## 2021-12-29 DIAGNOSIS — G8929 Other chronic pain: Secondary | ICD-10-CM | POA: Diagnosis not present

## 2021-12-29 DIAGNOSIS — E559 Vitamin D deficiency, unspecified: Secondary | ICD-10-CM

## 2021-12-29 DIAGNOSIS — Z23 Encounter for immunization: Secondary | ICD-10-CM | POA: Diagnosis not present

## 2021-12-29 DIAGNOSIS — D571 Sickle-cell disease without crisis: Secondary | ICD-10-CM

## 2021-12-29 MED ORDER — OXYCODONE HCL 20 MG PO TABS: 20.0000 mg | ORAL_TABLET | Freq: Four times a day (QID) | ORAL | 0 refills | Status: DC | PRN

## 2021-12-29 NOTE — Patient Instructions (Signed)
Living With Sickle Cell Disease Living with a long-term condition, such as sickle cell disease, can be a challenge. It can affect both your physical and mental health. You may not have total control over your condition. But proper care and treatment can help manage the effects of the disease so you can feel good and lead an active life. You can take steps to manage your condition and stay as healthy as possible. How does sickle cell disease affect me? Sickle cell disease can cause challenges that affect your quality of life. You may get sick more often as a result of organ damage and infections. Sometimes you may need to stay in the hospital. Learn how to recognize that you are not feeling well and that you may be getting sick. What actions can I take to manage my condition?  The goals of treatment are to control your symptoms and prevent and treat problems. Work with your health care provider to create a treatment plan that works for you. Taking an active role in managing your condition can help you feel more in control of your situation. Ask about possible side effects of medicines that your health care provider recommends. Discuss how you feel about having those side effects. Keeping a healthy lifestyle can help you manage your condition. This includes eating a healthy diet, getting enough sleep, and getting regular exercise. Sickle cell disease may affect your ability to take care of your basic needs. Tell your health care provider if you have concerns about any of these needs: Access to food. Housing. Safe drinking water and other utilities. Safety in your home and community. Work or school. Transportation. Paying for health care. Your health care provider may be able to connect you with community resources that can help you. How to manage stress  Living with sickle cell disease can be stressful. This disease can have a big impact on your mental health. Talk with your health care provider  about ways to reduce your stress or if you have concerns about your mental health.  To cope with stress, try: Keeping a stress diary. This can help you learn what causes your stress to start (figure out your triggers) and how to control your response to those triggers. Spending time doing things that you enjoy, such as: Hobbies. Being outdoors. Spending time with friends and people who make you laugh. Doing yoga, muscle relaxation, deep breathing, or mindfulness practices. Expressing yourself through journal writing, art, crafting, poetry, or playing music. Staying positive about your health. Try to accept that you cannot control your condition perfectly. Follow these instructions at home: Medicines Take over-the-counter and prescription medicines only as told by your health care provider. If you were prescribed antibiotics, take them as told by your health care provider. Do not stop taking them even if you start to feel better. If you develop a fever, do not take medicines to reduce the fever right away. This could cover up another problem. Contact your health care provider. Eating and drinking Drink enough fluid to keep your urine pale yellow. Drink more in hot weather and during exercise. Limit or avoid drinking alcohol. Eat a balanced and nutritious diet. Eat plenty of fruits, vegetables, whole grains, and lean protein. Take vitamins and supplements as told by your health care provider. Traveling When traveling, keep these with you: Your medical information. The names of your health care providers. Your medicines. If you have to travel by air, ask about precautions you should take. Managing pain Work with   your health care provider to create a pain management plan that works for you. The plan may include: Ways to reduce or manage your pain at home, such as: Using a heating pad. Taking a warm bath. Using healthy ways to distract you from the pain, such as hobbies or  reading. Practicing ways to relax, such as doing yoga or listening to music. Getting massages. Doing exercises or stretches as told by a physical therapist. Tracking how pain affects your daily life functions. When to seek help. Who to contact and what to do in case of a pain emergency. General instructions Do not use any products that contain nicotine or tobacco. These products include cigarettes, chewing tobacco, and vaping devices, such as e-cigarettes. These lower blood oxygen levels. If you need help quitting, ask your health care provider. Consider wearing a medical alert bracelet. Use an app or journal to track your symptoms, assess your level of pain and fatigue, and keep track of your medicines. Avoid the following: High altitudes. Very high or low temperatures and big changes in temperature. Activities that will lower your oxygen levels, such as mountain climbing or doing exercise that takes a lot of effort. Stay up to date on: Your treatment plan. Learn as much as you can about your condition. Health screenings. This will help prevent problems or catch them early on. Vaccines. This will help prevent infection. Wash your hands often with soap and water to help prevent infections. Wash them for at least 20 seconds each time. Keep all follow-up visits. Regular follow-up with your health care provider can help you better manage your condition. Where to find support You can find help and support through: Talking with a therapist or taking part in support groups. Sickle Cell Disease Foundation of [place]: www.sicklecelldisease.org Where to find more information Centers for Disease Control and Prevention: www.cdc.gov American Society of Hematology: www.hematology.org Contact a health care provider if: Your symptoms get worse. You have new symptoms. You have a fever. Get help right away if: You have a painful erection of the penis that lasts a long time (priapism). You become  short of breath or are having trouble breathing. You have pain that cannot be controlled with medicine. You have any signs of a stroke. "BE FAST" is an easy way to remember the main warning signs: B - Balance. Dizziness, sudden trouble walking, or loss of balance. E - Eyes. Trouble seeing or a change in how you see. F - Face. Sudden weakness or loss of feeling of the face. The face or eyelid may droop on one side. A - Arms. Weakness or loss of feeling in an arm. This happens all of a sudden and most often on one side of the body. S - Speech. Sudden trouble speaking, slurred speech, or trouble understanding what people say. T - Time. Time to call emergency services. Write down what time symptoms started. You have other signs of a stroke, such as: A sudden, very bad headache with no known cause. Feeling like you may vomit (nausea). Vomiting. Seizure. These symptoms may be an emergency. Get help right away. Call 911. Do not wait to see if the symptoms will go away. Do not drive yourself to the hospital. Also, get help right away if: You have strong feelings of sadness or loss of hope, or you have thoughts about hurting yourself or others. Take one of these steps if you feel like you may hurt yourself or others, or have thoughts about taking your own life:   Go to your nearest emergency room. Call 911. Call the National Suicide Prevention Lifeline at 1-800-273-8255 or 988. This is open 24 hours a day. Text the Crisis Text Line at 741741. Summary Proper care and treatment can help manage the effects of sickle cell disease so you can feel good and lead an active life. The goals of treatment are to control your symptoms and prevent and treat problems. Taking an active role in managing your condition can help you feel more in control of your situation. Work with your health care provider to create a pain management plan that works for you. Get medical help right away as told by your health care  provider. This information is not intended to replace advice given to you by your health care provider. Make sure you discuss any questions you have with your health care provider. Document Revised: 05/18/2021 Document Reviewed: 05/18/2021 Elsevier Patient Education  2023 Elsevier Inc.  

## 2021-12-29 NOTE — Progress Notes (Signed)
Established Patient Office Visit  Subjective   Patient ID: Paul Campos, male    DOB: Aug 24, 1990  Age: 31 y.o. MRN: [id number]  Chief Complaint  Patient presents with   Follow-up    Per pt he has been fine     Paul Campos is a 31 year old male with a medical history significant for sickle cell disease, chronic pain syndrome, opiate dependence and tolerance, and history of anemia of chronic disease presents for follow-up of chronic conditions.  Patient states that he has been doing well and is without complaint on today.  He has a long history of chronic pain that is primarily to his low back.  He rates pain as 5/10.  He last had oxycodone on last night with maximum relief.  He denies headache, chest pain, fever, chills, urinary symptoms, nausea, vomiting, or diarrhea.    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/02/2018   Acute pain of left shoulder 07/29/2016   Migraine without aura and without status migrainosus, not intractable 11/04/2015   Episodic headache 07/17/2015   Fever 10/01/2012   Sepsis ([place]) 10/01/2012   Anemia 09/30/2012   Hypokalemia 08/21/2012   Thrombocytopenia, unspecified ([place]) 08/19/2012   CAP (community acquired pneumonia) 08/18/2012   Pneumonia, unspecified organism 08/18/2012   Thrombocytopenia ([place]) 06/12/2012   Sickle cell pain crisis ([place]) 05/06/2012   Chest pain 05/06/2012   Vitamin D deficiency 11/02/2011   Cholelithiasis 10/14/2011   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   Splenomegaly 10/14/2011   Chronic pain 10/14/2011   Sickle cell anemia ([place]) 10/14/2011   Past Medical History:  Diagnosis Date   Headache    Sickle cell anemia (HCC)    Past  Surgical History:  Procedure Laterality Date   APPENDECTOMY     Social History   Tobacco Use   Smoking status: Never   Smokeless tobacco: Never  Vaping Use   Vaping Use: Never used  Substance Use Topics   Alcohol use: No   Drug use: No   Social History   Socioeconomic History   Marital status: Single    Spouse name: Not on file   Number of children: 1   Years of education: Not on file   Highest education level: High school graduate  Occupational History    Comment: none 09/03/19  Tobacco Use   Smoking status: Never   Smokeless tobacco: Never  Vaping Use   Vaping Use: Never used  Substance and Sexual Activity   Alcohol use: No   Drug use: No   Sexual activity: Yes    Birth control/protection: Condom  Other Topics Concern   Not on file  Social History Narrative   Caffeine- maybe coffee every other morning   Social Determinants of Health   Financial Resource Strain: Not on file  Food Insecurity: Not on file  Transportation Needs: Not on file  Physical Activity: Not on file  Stress: Not on file  Social Connections: Not on file  Intimate Partner Violence: Not on file   Family Status  Relation Name Status   Mother  Deceased   Father  Alive   Sister  Alive   Brother  [place]  Brother  Alive   Brother  Alive   MGM  Alive   MGF  Deceased   PGM  Deceased   PGF  Deceased   Family History  Problem Relation Age of Onset   Lung cancer Mother    Migraines Mother       Review of Systems  Constitutional: Negative.   HENT: Negative.    Eyes: Negative.   Respiratory: Negative.    Cardiovascular: Negative.   Gastrointestinal: Negative.   Genitourinary: Negative.   Musculoskeletal:  Positive for joint pain.  Skin: Negative.   Neurological: Negative.   Psychiatric/Behavioral: Negative.        Objective:     BP 111/71   Pulse 69   Temp 97.6 F (36.4 C)   Wt 177 lb 9.6 oz (80.6 kg)   SpO2 100%   BMI 24.09 kg/m  BP Readings from  Last 3 Encounters:  12/29/21 111/71  09/22/21 121/82  06/22/21 120/80   Wt Readings from Last 3 Encounters:  12/29/21 177 lb 9.6 oz (80.6 kg)  09/22/21 179 lb 12.8 oz (81.6 kg)  06/22/21 186 lb 6.4 oz (84.6 kg)      Physical Exam Constitutional:      Appearance: Normal appearance.  Eyes:     Pupils: Pupils are equal, round, and reactive to light.  Cardiovascular:     Rate and Rhythm: Normal rate and regular rhythm.     Pulses: Normal pulses.  Pulmonary:     Effort: Pulmonary effort is normal.  Skin:    General: Skin is warm.  Neurological:     General: No focal deficit present.     Mental Status: He is alert. Mental status is at baseline.  Psychiatric:        Mood and Affect: Mood normal.        Behavior: Behavior normal.        Thought Content: Thought content normal.        Judgment: Judgment normal.      No results found for any visits on 12/29/21.  Last CBC Lab Results  Component Value Date   WBC 13.1 (H) 09/22/2021   HGB 11.5 (L) 09/22/2021   HCT 35.0 (L) 09/22/2021   MCV 77 (L) 09/22/2021   MCH 25.4 (L) 09/22/2021   RDW 19.6 (H) 09/22/2021   PLT 182 76/28/3151   Last metabolic panel Lab Results  Component Value Date   GLUCOSE 96 09/22/2021   NA 138 09/22/2021   K 5.0 09/22/2021   CL 104 09/22/2021   CO2 18 (L) 09/22/2021   BUN 14 09/22/2021   CREATININE 0.95 09/22/2021   EGFR 111 09/22/2021   CALCIUM 9.8 09/22/2021   PROT 8.5 09/22/2021   ALBUMIN 5.0 09/22/2021   LABGLOB 3.5 09/22/2021   AGRATIO 1.4 09/22/2021   BILITOT 1.1 09/22/2021   ALKPHOS 85 09/22/2021   AST 36 09/22/2021   ALT 21 09/22/2021   ANIONGAP 7 08/18/2020   Last lipids No results found for: "CHOL", "HDL", "LDLCALC", "LDLDIRECT", "TRIG", "CHOLHDL" Last hemoglobin A1c No results found for: "HGBA1C" Last thyroid functions No results found for: "TSH", "T3TOTAL", "T4TOTAL", "THYROIDAB" Last vitamin D Lab Results  Component Value Date   VD25OH 20.1 (L) 09/22/2021       The ASCVD Risk score (Arnett DK, et al., 2019) failed to calculate for the following reasons:   The 2019 ASCVD risk score is only valid for ages 28 to 58    Assessment & Plan:   Problem List  Items Addressed This Visit       Other   Vitamin D deficiency   Relevant Orders   Sickle Cell Panel   Hb-SS disease without crisis ([place])   Relevant Medications   Oxycodone HCl 20 MG TABS   Other Relevant Orders   [id number] 11+Oxyco+Alc+Crt-Bund   Sickle Cell Panel   Chronic pain   Relevant Medications   Oxycodone HCl 20 MG TABS   Other Relevant Orders   [id number] 11+Oxyco+Alc+Crt-Bund   Other Visit Diagnoses     Need for influenza vaccination    -  Primary   Relevant Orders   Flu Vaccine QUAD 69moIM (Fluarix, Fluzone & Alfiuria Quad PF) (Completed)     1. Hb-SS disease without crisis ([place] For sickle cell disease, will continue hydroxyurea and folic acid.  No medication changes today.  Patient is up-to-date with vaccinations. -- [id number]+Oxyco+Alc+Crt-Bund - Sickle Cell Panel - Oxycodone HCl 20 MG TABS; Take 1 tablet (20 mg total) by mouth every 6 (six) hours as needed.  Dispense: 60 tablet; Refill: 0  2. Need for influenza vaccination Patient received influenza vaccination without complication. - Flu Vaccine QUAD 640moM (Fluarix, Fluzone & Alfiuria Quad PF)  3. Other chronic pain We will continue oxycodone 20 mg every 6 hours as needed for chronic pain.  Review UDS as results become available. - - [id number]+Oxyco+Alc+Crt-Bund - Oxycodone HCl 20 MG TABS; Take 1 tablet (20 mg total) by mouth every 6 (six) hours as needed.  Dispense: 60 tablet; Refill: 0  4. Vitamin D deficiency  - Sickle Cell Panel   Return in about 3 months (around 03/31/2022) for sickle cell anemia.   LaDonia PoundsAPRN, MSN, FNP-C Patient [address] [phone]

## 2021-12-30 LAB — CMP14+CBC/D/PLT+FER+RETIC+V...
ALT: 20 IU/L (ref 0–44)
AST: 24 IU/L (ref 0–40)
Albumin/Globulin Ratio: 1.4 (ref 1.2–2.2)
Albumin: 4.6 g/dL (ref 4.3–5.2)
Alkaline Phosphatase: 80 IU/L (ref 44–121)
BUN/Creatinine Ratio: 8 — ABNORMAL LOW (ref 9–20)
BUN: 7 mg/dL (ref 6–20)
Basophils Absolute: 0.1 10*3/uL (ref 0.0–0.2)
Basos: 1 %
Bilirubin Total: 0.8 mg/dL (ref 0.0–1.2)
CO2: 20 mmol/L (ref 20–29)
Calcium: 9.7 mg/dL (ref 8.7–10.2)
Chloride: 103 mmol/L (ref 96–106)
Creatinine, Ser: 0.91 mg/dL (ref 0.76–1.27)
EOS (ABSOLUTE): 0.4 10*3/uL (ref 0.0–0.4)
Eos: 5 %
Ferritin: 323 ng/mL (ref 30–400)
Globulin, Total: 3.4 g/dL (ref 1.5–4.5)
Glucose: 96 mg/dL (ref 70–99)
Hematocrit: 38.7 % (ref 37.5–51.0)
Hemoglobin: 12.5 g/dL — ABNORMAL LOW (ref 13.0–17.7)
Immature Grans (Abs): 0 10*3/uL (ref 0.0–0.1)
Immature Granulocytes: 0 %
Lymphocytes Absolute: 2.2 10*3/uL (ref 0.7–3.1)
Lymphs: 24 %
MCH: 24.5 pg — ABNORMAL LOW (ref 26.6–33.0)
MCHC: 32.3 g/dL (ref 31.5–35.7)
MCV: 76 fL — ABNORMAL LOW (ref 79–97)
Monocytes Absolute: 1.1 10*3/uL — ABNORMAL HIGH (ref 0.1–0.9)
Monocytes: 12 %
Neutrophils Absolute: 5.4 10*3/uL (ref 1.4–7.0)
Neutrophils: 58 %
Platelets: 222 10*3/uL (ref 150–450)
Potassium: 4.8 mmol/L (ref 3.5–5.2)
RBC: 5.1 x10E6/uL (ref 4.14–5.80)
RDW: 17.6 % — ABNORMAL HIGH (ref 11.6–15.4)
Retic Ct Pct: 3.5 % — ABNORMAL HIGH (ref 0.6–2.6)
Sodium: 138 mmol/L (ref 134–144)
Total Protein: 8 g/dL (ref 6.0–8.5)
Vit D, 25-Hydroxy: 24.6 ng/mL — ABNORMAL LOW (ref 30.0–100.0)
WBC: 9.2 10*3/uL (ref 3.4–10.8)
eGFR: 117 mL/min/{1.73_m2} (ref >59)

## 2022-01-02 LAB — CANNABINOID CONFIRMATION, UR
CANNABINOIDS: POSITIVE — AB
Carboxy THC GC/MS Conf: 19 ng/mL

## 2022-01-02 LAB — DRUG SCREEN 764883 11+OXYCO+ALC+CRT-BUND
Amphetamines, Urine: NEGATIVE ng/mL
BENZODIAZ UR QL: NEGATIVE ng/mL
Barbiturate: NEGATIVE ng/mL
Cocaine (Metabolite): NEGATIVE ng/mL
Creatinine: 166.6 mg/dL (ref 20.0–300.0)
Ethanol: NEGATIVE %
Meperidine: NEGATIVE ng/mL
Methadone Screen, Urine: NEGATIVE ng/mL
OPIATE SCREEN URINE: NEGATIVE ng/mL
Oxycodone/Oxymorphone, Urine: NEGATIVE ng/mL
Phencyclidine: NEGATIVE ng/mL
Propoxyphene: NEGATIVE ng/mL
Tramadol: NEGATIVE ng/mL
pH, Urine: 6.6 (ref 4.5–8.9)

## 2022-01-11 ENCOUNTER — Other Ambulatory Visit: Payer: Self-pay | Admitting: Family Medicine

## 2022-01-11 ENCOUNTER — Other Ambulatory Visit: Payer: Self-pay

## 2022-01-11 DIAGNOSIS — D571 Sickle-cell disease without crisis: Secondary | ICD-10-CM

## 2022-01-11 DIAGNOSIS — G8929 Other chronic pain: Secondary | ICD-10-CM

## 2022-01-11 MED ORDER — OXYCODONE HCL 20 MG PO TABS: 20.0000 mg | ORAL_TABLET | Freq: Four times a day (QID) | ORAL | 0 refills | Status: DC | PRN

## 2022-01-11 NOTE — Progress Notes (Signed)
Reviewed PDMP substance reporting system prior to prescribing opiate medications. No inconsistencies noted.  Meds ordered this encounter  Medications   Oxycodone HCl 20 MG TABS    Sig: Take 1 tablet (20 mg total) by mouth every 6 (six) hours as needed.    Dispense:  30 tablet    Refill:  0    Order Specific Question:   Supervising Provider    Answer:   [name], [name] [[id number]]   Paul Mejorado [name] Farron Lafond  APRN, MSN, FNP-C [facility] [address]  [place], [place] [address] [phone]  

## 2022-01-15 ENCOUNTER — Telehealth: Payer: Self-pay

## 2022-01-15 NOTE — Telephone Encounter (Signed)
Prior authorization for Oxycodone 20mg  submitted today via covermymeds key: [id number]

## 2022-01-18 ENCOUNTER — Other Ambulatory Visit: Payer: Self-pay

## 2022-01-18 NOTE — Telephone Encounter (Signed)
Approved until 07/15/22

## 2022-01-23 ENCOUNTER — Encounter (HOSPITAL_COMMUNITY): Payer: Self-pay | Admitting: Emergency Medicine

## 2022-01-23 ENCOUNTER — Other Ambulatory Visit: Payer: Self-pay

## 2022-01-23 ENCOUNTER — Emergency Department (HOSPITAL_COMMUNITY)
Admission: EM | Admit: 2022-01-23 | Discharge: 2022-01-24 | Disposition: A | Discharge status: Home / Self Care | Payer: Medicaid Other | Attending: Emergency Medicine | Admitting: Emergency Medicine

## 2022-01-23 DIAGNOSIS — M545 Low back pain, unspecified: Secondary | ICD-10-CM | POA: Insufficient documentation

## 2022-01-23 DIAGNOSIS — D5700 Hb-SS disease with crisis, unspecified: Secondary | ICD-10-CM | POA: Insufficient documentation

## 2022-01-23 DIAGNOSIS — M79661 Pain in right lower leg: Secondary | ICD-10-CM | POA: Diagnosis not present

## 2022-01-23 LAB — RETICULOCYTES
Immature Retic Fract: 32 % — ABNORMAL HIGH (ref 2.3–15.9)
RBC.: 5.07 MIL/uL (ref 4.22–5.81)
Retic Count, Absolute: 234.2 10*3/uL — ABNORMAL HIGH (ref 19.0–186.0)
Retic Ct Pct: 4.6 % — ABNORMAL HIGH (ref 0.4–3.1)

## 2022-01-23 NOTE — ED Triage Notes (Signed)
Pt via POV c/o sickle cell pain x 3 days in right leg and back. Pain currently 10/10 with mild nausea and vomiting earlier.

## 2022-01-23 NOTE — ED Provider Triage Note (Signed)
Emergency Medicine Provider Triage Evaluation Note  Paul Campos , a 31 y.o. male was evaluated in triage.  Pt complains of sickle cell pain crisis.  Reports 3 days of right leg pain and mid to lower back pain.  This is typical for him.  He has been trying to manage at home with as needed ibuprofen and oxycodone 1 mg.  Again he only takes this as needed.  Denies chest pain or shortness of breath.  No identifiable triggers.  No recent fever or other illness.  Review of Systems  Positive: Right leg pain, back pain Negative: Chest pain, fever, shortness of breath  Physical Exam  BP 113/83 (BP Location: Left Arm)   Pulse 61   Temp 98.1 F (36.7 C) (Oral)   Resp 16   Ht 6' (1.829 m)   Wt 81.6 kg   SpO2 95%   BMI 24.41 kg/m  Gen:   Awake, no distress   Resp:  Normal effort  MSK:   Moves extremities without difficulty  Other:  Tenderness to palpation over the mid to lower back bilaterally, no obvious joint swelling  Medical Decision Making  Medically screening exam initiated at 10:39 PM.  Appropriate orders placed.  Kash Davie was informed that the remainder of the evaluation will be completed by another provider, this initial triage assessment does not replace that evaluation, and the importance of remaining in the ED until their evaluation is complete.     Renne Crigler, PA-C 01/23/22 2241

## 2022-01-24 LAB — URINALYSIS, ROUTINE W REFLEX MICROSCOPIC
Bilirubin Urine: NEGATIVE
Glucose, UA: NEGATIVE mg/dL
Hgb urine dipstick: NEGATIVE
Ketones, ur: NEGATIVE mg/dL
Leukocytes,Ua: NEGATIVE
Nitrite: NEGATIVE
Protein, ur: NEGATIVE mg/dL
Specific Gravity, Urine: 1.01 (ref 1.005–1.030)
pH: 7 (ref 5.0–8.0)

## 2022-01-24 LAB — COMPREHENSIVE METABOLIC PANEL
ALT: 14 U/L (ref 0–44)
AST: 23 U/L (ref 15–41)
Albumin: 4.4 g/dL (ref 3.5–5.0)
Alkaline Phosphatase: 63 U/L (ref 38–126)
Anion gap: 9 (ref 5–15)
BUN: 10 mg/dL (ref 6–20)
CO2: 23 mmol/L (ref 22–32)
Calcium: 9.3 mg/dL (ref 8.9–10.3)
Chloride: 105 mmol/L (ref 98–111)
Creatinine, Ser: 0.74 mg/dL (ref 0.61–1.24)
GFR, Estimated: >60 mL/min (ref >60)
Glucose, Bld: 103 mg/dL — ABNORMAL HIGH (ref 70–99)
Potassium: 4.1 mmol/L (ref 3.5–5.1)
Sodium: 137 mmol/L (ref 135–145)
Total Bilirubin: 0.9 mg/dL (ref 0.3–1.2)
Total Protein: 8.7 g/dL — ABNORMAL HIGH (ref 6.5–8.1)

## 2022-01-24 LAB — CBC WITH DIFFERENTIAL/PLATELET
Abs Immature Granulocytes: 0 10*3/uL (ref 0.00–0.07)
Basophils Absolute: 0 10*3/uL (ref 0.0–0.1)
Basophils Relative: 0 %
Eosinophils Absolute: 0.4 10*3/uL (ref 0.0–0.5)
Eosinophils Relative: 5 %
HCT: 36.9 % — ABNORMAL LOW (ref 39.0–52.0)
Hemoglobin: 12.6 g/dL — ABNORMAL LOW (ref 13.0–17.0)
Lymphocytes Relative: 29 %
Lymphs Abs: 2.4 10*3/uL (ref 0.7–4.0)
MCH: 24.9 pg — ABNORMAL LOW (ref 26.0–34.0)
MCHC: 34.1 g/dL (ref 30.0–36.0)
MCV: 72.8 fL — ABNORMAL LOW (ref 80.0–100.0)
Monocytes Absolute: 1.1 10*3/uL — ABNORMAL HIGH (ref 0.1–1.0)
Monocytes Relative: 14 %
Neutro Abs: 4.3 10*3/uL (ref 1.7–7.7)
Neutrophils Relative %: 52 %
Platelets: 232 10*3/uL (ref 150–400)
RBC: 5.07 MIL/uL (ref 4.22–5.81)
RDW: 15.6 % — ABNORMAL HIGH (ref 11.5–15.5)
WBC: 8.2 10*3/uL (ref 4.0–10.5)
nRBC: 0.2 % (ref 0.0–0.2)
nRBC: 1 /100 WBC — ABNORMAL HIGH

## 2022-01-24 MED ORDER — MORPHINE SULFATE (PF) 4 MG/ML IV SOLN
8.0000 mg | Freq: Once | INTRAVENOUS | Status: AC
  Administered 2022-01-24: 8 mg via INTRAVENOUS
  Filled 2022-01-24: qty 2

## 2022-01-24 MED ORDER — DEXTROSE-NACL 5-0.45 % IV SOLN: INTRAVENOUS | Status: DC

## 2022-01-24 MED ORDER — MORPHINE SULFATE (PF) 4 MG/ML IV SOLN
4.0000 mg | Freq: Once | INTRAVENOUS | Status: AC
  Administered 2022-01-24: 4 mg via INTRAVENOUS
  Filled 2022-01-24: qty 1

## 2022-01-24 MED ORDER — KETOROLAC TROMETHAMINE 15 MG/ML IJ SOLN
15.0000 mg | Freq: Once | INTRAMUSCULAR | Status: AC
  Administered 2022-01-24: 15 mg via INTRAVENOUS
  Filled 2022-01-24: qty 1

## 2022-01-24 MED ORDER — ONDANSETRON HCL 4 MG/2ML IJ SOLN
4.0000 mg | Freq: Once | INTRAMUSCULAR | Status: AC
  Administered 2022-01-24: 4 mg via INTRAVENOUS
  Filled 2022-01-24: qty 2

## 2022-01-24 MED ORDER — DIPHENHYDRAMINE HCL 25 MG PO CAPS
50.0000 mg | ORAL_CAPSULE | Freq: Four times a day (QID) | ORAL | Status: DC | PRN
  Filled 2022-01-24: qty 2

## 2022-01-24 NOTE — ED Provider Notes (Signed)
[facility] DEPT Provider Note   CSN: [id number] Arrival date & time: 01/23/22  2221     History  Chief Complaint  Patient presents with   Sickle Cell Pain Crisis    Paul Campos is a 31 y.o. male.  31 y/o male with hx of sickle cell disease, chronic pain syndrome, opiate dependence and tolerance, and history of anemia of chronic disease presents to the emergency department for complaints of 3 days of right lower extremity and mid to low back pain.  Describes an aching pain which has been constant and worsening.  Reports taking his 20 mg oxycodone as well as ibuprofen for symptoms without relief.  He has had similar pain in the past attributed to sickle cell crisis.  Denies chest pain, shortness of breath, fevers, bowel changes, lower extremity swelling.   The history is provided by the patient. No language interpreter was used.  Sickle Cell Pain Crisis      Home Medications Prior to Admission medications   Medication Sig Start Date End Date Taking? Authorizing Provider  ergocalciferol (VITAMIN D2) 1.25 MG (50000 UT) capsule Take 1 capsule (50,000 Units total) by mouth once a week. 06/26/21  Yes Dorena Dew, FNP  folic acid (FOLVITE) [id number] MCG tablet Take 1 tablet (800 mcg total) by mouth daily. 08/06/20  Yes Dorena Dew, FNP  hydroxyurea (HYDREA) 500 MG capsule Take 1 capsule (500 mg total) by mouth 2 (two) times daily. May take with food to minimize GI side effects. Patient taking differently: Take 1,000 mg by mouth daily. May take with food to minimize GI side effects. 11/13/20  Yes Dorena Dew, FNP  ibuprofen (ADVIL) 800 MG tablet Take 1 tablet (800 mg total) by mouth every 8 (eight) hours as needed for moderate pain. 08/06/20  Yes Dorena Dew, FNP  Oxycodone HCl 20 MG TABS Take 1 tablet (20 mg total) by mouth every 6 (six) hours as needed. Patient taking differently: Take 20 mg by mouth every 6 (six) hours as needed (pain).  01/12/22  Yes Dorena Dew, FNP  topiramate (TOPAMAX) 50 MG tablet Take 1 tablet (50 mg total) by mouth 2 (two) times daily. Patient taking differently: Take 50 mg by mouth 2 (two) times daily as needed (migraines). 06/22/21  Yes Lomax, Amy, NP  Ubrogepant (UBRELVY) 100 MG TABS Take 1 tablet by mouth as needed. At onset of migraine. Take orally, with or without food. A second dose can be taken at least 2 hours after the initial dose, if needed. The maximum daily dose is 200 mg (2 tablets). No more than 16 tabs per month. Patient not taking: Reported on 01/24/2022 06/30/21   Debbora Presto, NP      Allergies    Dilaudid [hydromorphone hcl], Hydromorphone, and Cefotaxime    Review of Systems   Review of Systems Ten systems reviewed and are negative for acute change, except as noted in the HPI.    Physical Exam Updated Vital Signs BP 114/82   Pulse 60   Temp 98.2 F (36.8 C)   Resp 17   Ht 6' (1.829 m)   Wt 81.6 kg   SpO2 96%   BMI 24.41 kg/m   Physical Exam Vitals and nursing note reviewed.  Constitutional:      General: He is not in acute distress.    Appearance: He is well-developed. He is not diaphoretic.     Comments: Appears uncomfortable, grunting. Nontoxic.   HENT:  Head: Normocephalic and atraumatic.  Eyes:     General: No scleral icterus.    Conjunctiva/sclera: Conjunctivae normal.  Cardiovascular:     Rate and Rhythm: Normal rate and regular rhythm.     Pulses: Normal pulses.  Pulmonary:     Effort: Pulmonary effort is normal. No respiratory distress.     Breath sounds: No stridor. No wheezing.     Comments: Respirations even and unlabored Abdominal:     Palpations: Abdomen is soft.     Tenderness: There is no abdominal tenderness. There is no guarding.     Comments: Soft, nondistended, nontender.  Musculoskeletal:        General: Normal range of motion.     Cervical back: Normal range of motion.     Comments: No BLE edema or asymmetry.   Skin:     General: Skin is warm and dry.     Coloration: Skin is not pale.     Findings: No erythema or rash.  Neurological:     Mental Status: He is alert and oriented to person, place, and time.     Coordination: Coordination normal.  Psychiatric:        Behavior: Behavior normal.     ED Results / Procedures / Treatments   Labs (all labs ordered are listed, but only abnormal results are displayed) Labs Reviewed  CBC WITH DIFFERENTIAL/PLATELET - Abnormal; Notable for the following components:      Result Value   Hemoglobin 12.6 (*)    HCT 36.9 (*)    MCV 72.8 (*)    MCH 24.9 (*)    RDW 15.6 (*)    Monocytes Absolute 1.1 (*)    nRBC 1 (*)    All other components within normal limits  COMPREHENSIVE METABOLIC PANEL - Abnormal; Notable for the following components:   Glucose, Bld 103 (*)    Total Protein 8.7 (*)    All other components within normal limits  RETICULOCYTES - Abnormal; Notable for the following components:   Retic Ct Pct 4.6 (*)    Retic Count, Absolute 234.2 (*)    Immature Retic Fract 32.0 (*)    All other components within normal limits  URINALYSIS, ROUTINE W REFLEX MICROSCOPIC - Abnormal; Notable for the following components:   Color, Urine YELLOW (*)    APPearance CLEAR (*)    All other components within normal limits    EKG None  Radiology No results found.  Procedures Procedures    Medications Ordered in ED Medications  diphenhydrAMINE (BENADRYL) capsule 50 mg (has no administration in time range)  dextrose 5 %-0.45 % sodium chloride infusion (0 mLs Intravenous Stopped 01/24/22 0411)  ketorolac (TORADOL) 15 MG/ML injection 15 mg (15 mg Intravenous Given 01/24/22 0034)  ondansetron (ZOFRAN) injection 4 mg (4 mg Intravenous Given 01/24/22 0036)  morphine (PF) 4 MG/ML injection 8 mg (8 mg Intravenous Given 01/24/22 0038)  morphine (PF) 4 MG/ML injection 4 mg (4 mg Intravenous Given 01/24/22 0207)  morphine (PF) 4 MG/ML injection 8 mg (8 mg Intravenous Given  01/24/22 0332)    ED Course/ Medical Decision Making/ A&P Clinical Course as of 01/24/22 0422  Sun Jan 24, 2022  0200 Pain rated 8/10 down from 10/10 on arrival. Second dose of pain medication ordered. [KH]  0401 Pain improved to 6/10. Feels well enough for discharge.  Labs are reviewed and are consistent with baseline.  Vital signs stable. [KH]    Clinical Course User Index [KH] Antony Madura, PA-C  Medical Decision Making Risk Prescription drug management.   This patient presents to the ED for concern of RLE and low back pain, this involves an extensive number of treatment options, and is a complaint that carries with it a high risk of complications and morbidity.  The differential diagnosis includes sickle cell anemia vs rhabdomyolysis vs aplastic crisis vs VTE   Co morbidities that complicate the patient evaluation  Sickle cell anemia Opiate dependence   Additional history obtained:  External records from outside source obtained and reviewed including historical H/H values for trending   Lab Tests:  I Ordered, and personally interpreted labs.  The pertinent results include:  Hgb 12.6 (c/w baseline), normal CMP, negative UA.  Reticulocyte count not presently concerning for aplastic crisis.   Cardiac Monitoring:  The patient was maintained on a cardiac monitor.  I personally viewed and interpreted the cardiac monitored which showed an underlying rhythm of: NSR   Medicines ordered and prescription drug management:  I ordered medication including Morphine for pain  Reevaluation of the patient after these medicines showed that the patient improved I have reviewed the patients home medicines and have made adjustments as needed   Test Considered:  CK   Problem List / ED Course:  As above   Reevaluation:  After the interventions noted above, I reevaluated the patient and found that they have :improved   Social Determinants of  Health:  Insured patient   Dispostion:  After consideration of the diagnostic results and the patients response to treatment, I feel that the patent would benefit from outpatient hematologic follow-up.  Patient encouraged to continue his prescribed medications for pain control. Return precautions discussed and provided. Patient discharged in stable condition with no unaddressed concerns.          Final Clinical Impression(s) / ED Diagnoses Final diagnoses:  Sickle-cell disease with pain [facility])    Rx / DC Orders ED Discharge Orders     None         Antonietta Breach, PA-C 01/24/22 Harolyn Rutherford, MD 01/24/22 [phone]

## 2022-01-24 NOTE — ED Notes (Signed)
Urinal at bedside for pt for U/A collection per MD order. [employer]

## 2022-01-24 NOTE — Discharge Instructions (Signed)
Continue your prescribed medications for additional pain control.  Follow-up with your hematologist.  If pain persists, you may also seek care at the sickle cell day hospital.

## 2022-01-27 ENCOUNTER — Other Ambulatory Visit: Payer: Self-pay | Admitting: Family Medicine

## 2022-01-27 ENCOUNTER — Other Ambulatory Visit: Payer: Self-pay

## 2022-01-27 DIAGNOSIS — G8929 Other chronic pain: Secondary | ICD-10-CM

## 2022-01-27 DIAGNOSIS — D571 Sickle-cell disease without crisis: Secondary | ICD-10-CM

## 2022-01-27 MED ORDER — OXYCODONE HCL 20 MG PO TABS: 20.0000 mg | ORAL_TABLET | Freq: Four times a day (QID) | ORAL | 0 refills | Status: DC | PRN

## 2022-01-27 NOTE — Progress Notes (Signed)
Reviewed PDMP substance reporting system prior to prescribing opiate medications. No inconsistencies noted.  Meds ordered this encounter  Medications   Oxycodone HCl 20 MG TABS    Sig: Take 1 tablet (20 mg total) by mouth every 6 (six) hours as needed.    Dispense:  30 tablet    Refill:  0    Order Specific Question:   Supervising Provider    Answer:   [name], [name] [[id number]]   Verline Kong [name] Korion Cuevas  APRN, MSN, FNP-C Patient Care Center [facility] [address]  [place], [place] [address] [phone]  

## 2022-02-02 ENCOUNTER — Ambulatory Visit: Payer: Medicaid Other | Admitting: Family Medicine

## 2022-02-05 ENCOUNTER — Other Ambulatory Visit: Payer: Self-pay

## 2022-02-05 DIAGNOSIS — G8929 Other chronic pain: Secondary | ICD-10-CM

## 2022-02-05 DIAGNOSIS — D571 Sickle-cell disease without crisis: Secondary | ICD-10-CM

## 2022-02-05 NOTE — Telephone Encounter (Signed)
From: [name] To: Office of Julianne Handler, [place] Sent: 02/05/2022 10:07 AM EST Subject: Medication Renewal Request  Refills have been requested for the following medications:   Oxycodone HCl 20 MG TABS [Lachina Hollis]  Preferred pharmacy: [facility] #[id number] - [place], [place] - [address] Delivery method: [employer]

## 2022-02-10 ENCOUNTER — Other Ambulatory Visit: Payer: Self-pay | Admitting: Family Medicine

## 2022-02-10 ENCOUNTER — Other Ambulatory Visit: Payer: Self-pay

## 2022-02-10 DIAGNOSIS — D571 Sickle-cell disease without crisis: Secondary | ICD-10-CM

## 2022-02-10 DIAGNOSIS — G8929 Other chronic pain: Secondary | ICD-10-CM

## 2022-02-10 NOTE — Telephone Encounter (Signed)
From: [name] To: Office of Julianne Handler, [place] Sent: 02/10/2022 9:38 AM EST Subject: Medication Renewal Request  Refills have been requested for the following medications:   Oxycodone HCl 20 MG TABS [Lachina Hollis]  Preferred pharmacy: [facility] #[id number] - [place], [place] - [address] Delivery method: [employer]

## 2022-02-10 NOTE — Progress Notes (Signed)
Orders Placed This Encounter  Procedures   [id number] 11+Oxyco+Alc+Crt-Bund    Standing Status:   Future    Standing Expiration Date:   02/11/2023   Nolon Nations  APRN, MSN, FNP-C Patient Care [facility] [address] [place], [place] [address] [phone]

## 2022-03-01 ENCOUNTER — Other Ambulatory Visit: Payer: Self-pay | Admitting: Family Medicine

## 2022-03-01 ENCOUNTER — Other Ambulatory Visit: Payer: Self-pay

## 2022-03-01 DIAGNOSIS — D571 Sickle-cell disease without crisis: Secondary | ICD-10-CM

## 2022-03-01 DIAGNOSIS — G8929 Other chronic pain: Secondary | ICD-10-CM

## 2022-03-01 MED ORDER — OXYCODONE HCL 20 MG PO TABS: 20.0000 mg | ORAL_TABLET | Freq: Four times a day (QID) | ORAL | 0 refills | Status: DC | PRN

## 2022-03-01 NOTE — Telephone Encounter (Signed)
From: Marylin Crosby To: Office of Cammie Sickle, [place] Sent: 02/27/2022 1:01 PM EST Subject: Medication Renewal Request  Refills have been requested for the following medications:   Oxycodone HCl 20 MG TABS [Lachina Hollis]  Preferred pharmacy: [facility] #[id number] - [place], [place] - [address] Delivery method: [employer]

## 2022-03-01 NOTE — Progress Notes (Signed)
Reviewed PDMP substance reporting system prior to prescribing opiate medications. No inconsistencies noted.  Meds ordered this encounter  Medications   Oxycodone HCl 20 MG TABS    Sig: Take 1 tablet (20 mg total) by mouth every 6 (six) hours as needed.    Dispense:  30 tablet    Refill:  0    Order Specific Question:   Supervising Provider    Answer:   [name], [name] [[id number]]   Airon Sahni [name] Yaritzi Craun  APRN, MSN, FNP-C Patient Care Center [facility] [address]  [place], [place] [address] [phone]  

## 2022-03-01 NOTE — Telephone Encounter (Signed)
Please advise Kh 

## 2022-03-12 ENCOUNTER — Other Ambulatory Visit: Payer: Self-pay

## 2022-03-12 DIAGNOSIS — D571 Sickle-cell disease without crisis: Secondary | ICD-10-CM

## 2022-03-12 DIAGNOSIS — G8929 Other chronic pain: Secondary | ICD-10-CM

## 2022-03-12 MED ORDER — OXYCODONE HCL 20 MG PO TABS: 20.0000 mg | ORAL_TABLET | Freq: Four times a day (QID) | ORAL | 0 refills | Status: DC | PRN

## 2022-03-12 NOTE — Telephone Encounter (Signed)
From: Marylin Crosby To: Office of Cammie Sickle, [place] Sent: 03/12/2022 10:23 AM EST Subject: Medication Renewal Request  Refills have been requested for the following medications:   Oxycodone HCl 20 MG TABS [Lachina Hollis]  Preferred pharmacy: [facility] #[id number] - [place], [place] - [address] Delivery method: [employer]

## 2022-03-24 ENCOUNTER — Other Ambulatory Visit: Payer: Self-pay

## 2022-03-24 DIAGNOSIS — G8929 Other chronic pain: Secondary | ICD-10-CM

## 2022-03-24 DIAGNOSIS — D571 Sickle-cell disease without crisis: Secondary | ICD-10-CM

## 2022-03-24 NOTE — Telephone Encounter (Signed)
From: Marylin Crosby To: Office of Cammie Sickle, [place] Sent: 03/24/2022 10:36 AM EST Subject: Medication Renewal Request  Refills have been requested for the following medications:   Oxycodone HCl 20 MG TABS [Lachina Hollis]  Preferred pharmacy: [facility] #[id number] - [place], [place] - [address] Delivery method: [employer]

## 2022-03-24 NOTE — Telephone Encounter (Signed)
Please advise Kh 

## 2022-03-25 ENCOUNTER — Other Ambulatory Visit: Payer: Self-pay | Admitting: Family Medicine

## 2022-03-25 ENCOUNTER — Other Ambulatory Visit: Payer: Self-pay

## 2022-03-25 DIAGNOSIS — D571 Sickle-cell disease without crisis: Secondary | ICD-10-CM

## 2022-03-25 DIAGNOSIS — G8929 Other chronic pain: Secondary | ICD-10-CM

## 2022-03-25 MED ORDER — OXYCODONE HCL 20 MG PO TABS: 20.0000 mg | ORAL_TABLET | Freq: Four times a day (QID) | ORAL | 0 refills | Status: DC | PRN

## 2022-03-25 NOTE — Telephone Encounter (Signed)
From: Marylin Crosby To: Office of Cammie Sickle, [place] Sent: 03/25/2022 12:32 PM EST Subject: Medication Renewal Request  Refills have been requested for the following medications:   Oxycodone HCl 20 MG TABS [Lachina Hollis]  Preferred pharmacy: [facility] #[id number] - [place], [place] - [address] Delivery method: [employer]

## 2022-03-25 NOTE — Progress Notes (Signed)
Reviewed PDMP substance reporting system prior to prescribing opiate medications. No inconsistencies noted.  Meds ordered this encounter  Medications   Oxycodone HCl 20 MG TABS    Sig: Take 1 tablet (20 mg total) by mouth every 6 (six) hours as needed.    Dispense:  30 tablet    Refill:  0    Order Specific Question:   Supervising Provider    Answer:   [name], [name] [[id number]]   Eutimio Gharibian [name] Carmisha Larusso  APRN, MSN, FNP-C Patient Care Center [facility] [address]  [place], [place] [address] [phone]  

## 2022-03-30 ENCOUNTER — Ambulatory Visit: Payer: Medicaid Other | Admitting: Family Medicine

## 2022-03-30 VITALS — BP 110/71 | HR 89 | Temp 97.8°F | Wt 185.8 lb

## 2022-03-30 DIAGNOSIS — D571 Sickle-cell disease without crisis: Secondary | ICD-10-CM | POA: Diagnosis not present

## 2022-03-30 DIAGNOSIS — G8929 Other chronic pain: Secondary | ICD-10-CM | POA: Diagnosis not present

## 2022-03-30 DIAGNOSIS — E559 Vitamin D deficiency, unspecified: Secondary | ICD-10-CM

## 2022-03-30 MED ORDER — HYDROXYUREA 500 MG PO CAPS: 1000.0000 mg | ORAL_CAPSULE | Freq: Every day | ORAL | 3 refills | Status: DC

## 2022-03-30 MED ORDER — ERGOCALCIFEROL 1.25 MG (50000 UT) PO CAPS: 50000.0000 [IU] | ORAL_CAPSULE | ORAL | 3 refills | Status: DC

## 2022-03-30 NOTE — Progress Notes (Signed)
Established Patient Office Visit  Subjective   Patient ID: Paul Campos, male    DOB: [birth year]  Age: 32 y.o. MRN: [id number]  Chief Complaint  Patient presents with   Follow-up    Sickle cell     Paul Campos is a very pleasant 32 year old male with a medical history significant for sickle cell disease, chronic pain syndrome, opiate dependence and tolerance and anemia of chronic disease presents for a follow up of chronic conditions. Patient says that Paul Campos is doing well and is without complaint on today.  Patient was last hospitalized with sickle cell crisis 2 months ago. Paul Campos has fairly well controlled sickle cell disease with infrequent pain crisis.  Paul Campos has a history of chronic pain primarily to low back and lower extremities. Paul Campos last had oxycodone on yesterday with some relief. Paul Campos denies headache, chest pain, urinary symptoms, nausea, vomiting, or diarrhea.     Patient Active Problem List   Diagnosis Date Noted   Elevated troponin 10/20/2020   Leukocytosis 05/07/2020   Chronic pain syndrome 05/07/2020   Acute sickle cell crisis ([place]) 05/06/2020   Nausea & vomiting 11/05/2018   Sickle cell anemia with crisis ([place]) 11/04/2018   Hb-SS disease without crisis ([place]) 01/02/2018   Acute pain of left shoulder 07/29/2016   Migraine without aura and without status migrainosus, not intractable 11/04/2015   Episodic headache 07/17/2015   Fever 10/01/2012   Sepsis ([place]) 10/01/2012   Anemia 09/30/2012   Hypokalemia 08/21/2012   Thrombocytopenia, unspecified ([place]) 08/19/2012   CAP (community acquired pneumonia) 08/18/2012   Pneumonia, unspecified organism 08/18/2012   Thrombocytopenia ([place]) 06/12/2012   Sickle cell pain crisis ([place]) 05/06/2012   Chest pain 05/06/2012   Vitamin D deficiency 11/02/2011   Cholelithiasis 10/14/2011   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   Splenomegaly 10/14/2011   Chronic pain 10/14/2011   Sickle cell anemia ([facility]) 10/14/2011    Past Medical History:  Diagnosis Date   Headache    Sickle cell anemia (HCC)    Past Surgical History:  Procedure Laterality Date   APPENDECTOMY     Social History   Tobacco Use   Smoking status: Never   Smokeless tobacco: Never  Vaping Use   Vaping Use: Never used  Substance Use Topics   Alcohol use: No   Drug use: No   Social History   Socioeconomic History   Marital status: Single    Spouse name: Not on file   Number of children: 1   Years of education: Not on file   Highest education level: High school graduate  Occupational History    Comment: none 09/03/19  Tobacco Use   Smoking status: Never   Smokeless tobacco: Never  Vaping Use   Vaping Use: Never used  Substance and Sexual Activity   Alcohol use: No   Drug use: No   Sexual activity: Yes    Birth control/protection: Condom  Other Topics Concern   Not on file  Social History Narrative   Caffeine- maybe coffee every other morning   Social Determinants of Health   Financial Resource Strain: Not on file  Food Insecurity: Not on file  Transportation Needs: Not on file  Physical Activity: Not on file  Stress: Not on file  Social Connections: Not on file  Intimate Partner Violence: Not on file   Family Status  Relation Name Status   Mother  Deceased   Father  Alive   Sister  Alive   Brother  Alive   Brother  Alive   Brother  Alive   Brother  Alive   MGM  Alive   MGF  Deceased   PGM  Deceased   PGF  Deceased   Family History  Problem Relation Age of Onset   Lung cancer Mother    Migraines Mother    Allergies  Allergen Reactions   Dilaudid [Hydromorphone Hcl] Hives   Hydromorphone Itching    Morphine okay.   Cefotaxime Itching and Rash    Hives      Review of Systems  Constitutional: Negative.   HENT: Negative.    Eyes: Negative.   Respiratory: Negative.    Cardiovascular: Negative.   Gastrointestinal: Negative.   Genitourinary: Negative.   Musculoskeletal:  Positive for  back pain and joint pain.  Skin: Negative.   Neurological: Negative.   Psychiatric/Behavioral: Negative.        Objective:     BP 110/71   Pulse 89   Temp 97.8 F (36.6 C)   Wt 185 lb 12.8 oz (84.3 kg)   SpO2 98%   BMI 25.20 kg/m  BP Readings from Last 3 Encounters:  03/30/22 110/71  01/24/22 114/82  12/29/21 111/71   Wt Readings from Last 3 Encounters:  03/30/22 185 lb 12.8 oz (84.3 kg)  01/23/22 180 lb (81.6 kg)  12/29/21 177 lb 9.6 oz (80.6 kg)      Physical Exam Constitutional:      Appearance: Normal appearance.  Eyes:     Pupils: Pupils are equal, round, and reactive to light.  Cardiovascular:     Rate and Rhythm: Normal rate and regular rhythm.     Pulses: Normal pulses.  Pulmonary:     Effort: Pulmonary effort is normal.  Abdominal:     General: Bowel sounds are normal.  Musculoskeletal:        General: Normal range of motion.  Skin:    General: Skin is warm.  Neurological:     General: No focal deficit present.     Mental Status: Paul Campos is alert. Mental status is at baseline.  Psychiatric:        Mood and Affect: Mood normal.        Behavior: Behavior normal.        Thought Content: Thought content normal.      No results found for any visits on 03/30/22.  Last CBC Lab Results  Component Value Date   WBC 8.2 01/23/2022   HGB 12.6 (L) 01/23/2022   HCT 36.9 (L) 01/23/2022   MCV 72.8 (L) 01/23/2022   MCH 24.9 (L) 01/23/2022   RDW 15.6 (H) 01/23/2022   PLT 232 [id number]   Last metabolic panel Lab Results  Component Value Date   GLUCOSE 103 (H) 01/23/2022   NA 137 01/23/2022   K 4.1 01/23/2022   CL 105 01/23/2022   CO2 23 01/23/2022   BUN 10 01/23/2022   CREATININE 0.74 01/23/2022   GFRNONAA >60 01/23/2022   CALCIUM 9.3 01/23/2022   PROT 8.7 (H) 01/23/2022   ALBUMIN 4.4 01/23/2022   LABGLOB 3.4 12/29/2021   AGRATIO 1.4 12/29/2021   BILITOT 0.9 01/23/2022   ALKPHOS 63 01/23/2022   AST 23 01/23/2022   ALT 14 01/23/2022    ANIONGAP 9 01/23/2022   Last lipids No results found for: "CHOL", "HDL", "LDLCALC", "LDLDIRECT", "TRIG", "CHOLHDL" Last hemoglobin A1c No results found for: "HGBA1C" Last thyroid functions No results found for: "TSH", "T3TOTAL", "T4TOTAL", "THYROIDAB" Last vitamin D Lab Results  Component Value Date   VD25OH 24.6 (L) 12/29/2021   Last vitamin B12 and Folate No results found for: "VITAMINB12", "FOLATE"    The ASCVD Risk score (Arnett DK, et al., 2019) failed to calculate for the following reasons:   The 2019 ASCVD risk score is only valid for ages 60 to 19    Assessment & Plan:   Problem List Items Addressed This Visit       Other   Vitamin D deficiency   Hb-SS disease without crisis ([place]) - Primary   Relevant Medications   hydroxyurea (HYDREA) 500 MG capsule   Other Relevant Orders   Ambulatory referral to Hematology / Oncology   Ambulatory referral to Ophthalmology   Chronic pain   1. Hb-SS disease without crisis [facility]) Discussed gene therapy at length, will send a referral to hematology.  - Ambulatory referral to Hematology / Oncology - Ambulatory referral to Ophthalmology - hydroxyurea (HYDREA) 500 MG capsule; Take 2 capsules (1,000 mg total) by mouth daily. May take with food to minimize GI side effects.  Dispense: 90 capsule; Refill: 3 - Sickle Cell Panel  2. Vitamin D deficiency  - Sickle Cell Panel - ergocalciferol (VITAMIN D2) 1.25 MG (50000 UT) capsule; Take 1 capsule (50,000 Units total) by mouth once a week.  Dispense: 12 capsule; Refill: 3  3. Other chronic pain Reviewed PDMP substance reporting system prior to prescribing opiate medications. No inconsistencies noted.   Return in about 3 months (around 06/28/2022) for sickle cell anemia.    Donia Pounds  APRN, MSN, FNP-C [facility] [address] [place], [place] [address] [phone]

## 2022-03-31 LAB — CMP14+CBC/D/PLT+FER+RETIC+V...
ALT: 11 IU/L (ref 0–44)
AST: 18 IU/L (ref 0–40)
Albumin/Globulin Ratio: 1.4 (ref 1.2–2.2)
Albumin: 4.2 g/dL — ABNORMAL LOW (ref 4.3–5.2)
Alkaline Phosphatase: 76 IU/L (ref 44–121)
BUN/Creatinine Ratio: 6 — ABNORMAL LOW (ref 9–20)
BUN: 5 mg/dL — ABNORMAL LOW (ref 6–20)
Basophils Absolute: 0 10*3/uL (ref 0.0–0.2)
Basos: 0 %
Bilirubin Total: 0.6 mg/dL (ref 0.0–1.2)
CO2: 24 mmol/L (ref 20–29)
Calcium: 9.8 mg/dL (ref 8.7–10.2)
Chloride: 99 mmol/L (ref 96–106)
Creatinine, Ser: 0.89 mg/dL (ref 0.76–1.27)
EOS (ABSOLUTE): 0.6 10*3/uL — ABNORMAL HIGH (ref 0.0–0.4)
Eos: 5 %
Ferritin: 157 ng/mL (ref 30–400)
Globulin, Total: 2.9 g/dL (ref 1.5–4.5)
Glucose: 87 mg/dL (ref 70–99)
Hematocrit: 37.5 % (ref 37.5–51.0)
Hemoglobin: 12 g/dL — ABNORMAL LOW (ref 13.0–17.7)
Immature Grans (Abs): 0 10*3/uL (ref 0.0–0.1)
Immature Granulocytes: 0 %
Lymphocytes Absolute: 2.6 10*3/uL (ref 0.7–3.1)
Lymphs: 24 %
MCH: 24.5 pg — ABNORMAL LOW (ref 26.6–33.0)
MCHC: 32 g/dL (ref 31.5–35.7)
MCV: 77 fL — ABNORMAL LOW (ref 79–97)
Monocytes Absolute: 1.5 10*3/uL — ABNORMAL HIGH (ref 0.1–0.9)
Monocytes: 14 %
NRBC: 1 % — ABNORMAL HIGH (ref 0–0)
Neutrophils Absolute: 6 10*3/uL (ref 1.4–7.0)
Neutrophils: 57 %
Platelets: 230 10*3/uL (ref 150–450)
Potassium: 4.2 mmol/L (ref 3.5–5.2)
RBC: 4.9 x10E6/uL (ref 4.14–5.80)
RDW: 17.7 % — ABNORMAL HIGH (ref 11.6–15.4)
Retic Ct Pct: 5.7 % — ABNORMAL HIGH (ref 0.6–2.6)
Sodium: 137 mmol/L (ref 134–144)
Total Protein: 7.1 g/dL (ref 6.0–8.5)
Vit D, 25-Hydroxy: 16.1 ng/mL — ABNORMAL LOW (ref 30.0–100.0)
WBC: 10.8 10*3/uL (ref 3.4–10.8)
eGFR: 118 mL/min/{1.73_m2} (ref >59)

## 2022-04-05 ENCOUNTER — Other Ambulatory Visit: Payer: Self-pay

## 2022-04-05 DIAGNOSIS — D571 Sickle-cell disease without crisis: Secondary | ICD-10-CM

## 2022-04-05 DIAGNOSIS — G8929 Other chronic pain: Secondary | ICD-10-CM

## 2022-04-05 NOTE — Telephone Encounter (Signed)
From: Marylin Crosby To: Office of Cammie Sickle, [place] Sent: 04/04/2022 1:46 PM EST Subject: Medication Renewal Request  Refills have been requested for the following medications:   Oxycodone HCl 20 MG TABS [Lachina Hollis]  Preferred pharmacy: [facility] [id number] - [place], [place] - [address] Delivery method: [employer]

## 2022-04-05 NOTE — Telephone Encounter (Signed)
Please advise KH 

## 2022-04-06 ENCOUNTER — Other Ambulatory Visit: Payer: Self-pay

## 2022-04-06 DIAGNOSIS — D571 Sickle-cell disease without crisis: Secondary | ICD-10-CM

## 2022-04-06 DIAGNOSIS — G8929 Other chronic pain: Secondary | ICD-10-CM

## 2022-04-06 MED ORDER — OXYCODONE HCL 20 MG PO TABS: 20.0000 mg | ORAL_TABLET | Freq: Four times a day (QID) | ORAL | 0 refills | Status: DC | PRN

## 2022-04-07 ENCOUNTER — Other Ambulatory Visit: Payer: Self-pay | Admitting: Family Medicine

## 2022-04-21 ENCOUNTER — Other Ambulatory Visit: Payer: Self-pay | Admitting: Family Medicine

## 2022-04-21 ENCOUNTER — Encounter: Payer: Self-pay | Admitting: Family Medicine

## 2022-04-21 DIAGNOSIS — D571 Sickle-cell disease without crisis: Secondary | ICD-10-CM

## 2022-04-21 DIAGNOSIS — G8929 Other chronic pain: Secondary | ICD-10-CM

## 2022-04-21 MED ORDER — OXYCODONE HCL 20 MG PO TABS: 20.0000 mg | ORAL_TABLET | Freq: Four times a day (QID) | ORAL | 0 refills | Status: DC | PRN

## 2022-04-21 NOTE — Telephone Encounter (Signed)
Reviewed PDMP substance reporting system prior to prescribing opiate medications. No inconsistencies noted.  Meds ordered this encounter  Medications   Oxycodone HCl 20 MG TABS    Sig: Take 1 tablet (20 mg total) by mouth every 6 (six) hours as needed.    Dispense:  30 tablet    Refill:  0    Order Specific Question:   Supervising Provider    Answer:   Tresa Garter [id number]   Donia Pounds  APRN, MSN, FNP-C [facility] [address] [place], [place] [address] [phone]

## 2022-04-21 NOTE — Telephone Encounter (Signed)
From: Marylin Crosby To: Office of Cammie Sickle, [place] Sent: 04/20/2022 12:37 PM EST Subject: Medication Renewal Request  Refills have been requested for the following medications:   Oxycodone HCl 20 MG TABS [Algenis Ballin]  Preferred pharmacy: [facility] [id number] - [place], [place] - [address] Delivery method: [employer]

## 2022-05-05 ENCOUNTER — Other Ambulatory Visit: Payer: Self-pay

## 2022-05-05 DIAGNOSIS — G8929 Other chronic pain: Secondary | ICD-10-CM

## 2022-05-05 DIAGNOSIS — D571 Sickle-cell disease without crisis: Secondary | ICD-10-CM

## 2022-05-05 NOTE — Telephone Encounter (Signed)
From: Marylin Crosby To: Office of Cammie Sickle, [place] Sent: 05/01/2022 5:17 PM EST Subject: Medication Renewal Request  Refills have been requested for the following medications:   Oxycodone HCl 20 MG TABS [Lachina Hollis]  Preferred pharmacy: [facility] [id number] - [place], [place] - [address] Delivery method: [employer]

## 2022-05-05 NOTE — Telephone Encounter (Signed)
Due to [place] beng out of the office please advise if either of you will fill this medication on her behalf. Thank you.   kh

## 2022-05-06 ENCOUNTER — Other Ambulatory Visit: Payer: Self-pay

## 2022-05-06 DIAGNOSIS — G8929 Other chronic pain: Secondary | ICD-10-CM

## 2022-05-06 DIAGNOSIS — D571 Sickle-cell disease without crisis: Secondary | ICD-10-CM

## 2022-05-06 MED ORDER — OXYCODONE HCL 20 MG PO TABS: 20.0000 mg | ORAL_TABLET | Freq: Four times a day (QID) | ORAL | 0 refills | Status: DC | PRN

## 2022-05-06 NOTE — Telephone Encounter (Signed)
Please advise KH 

## 2022-05-06 NOTE — Telephone Encounter (Signed)
Med sent in by [facility]

## 2022-05-06 NOTE — Telephone Encounter (Signed)
From: Marylin Crosby To: Office of Cammie Sickle, [place] Sent: 05/05/2022 11:25 PM EDT Subject: Medication Renewal Request  Refills have been requested for the following medications:   Oxycodone HCl 20 MG TABS [Lachina Hollis]  Preferred pharmacy: [facility] [id number] - [place], [place] - [address] Delivery method: [employer]

## 2022-05-15 ENCOUNTER — Other Ambulatory Visit: Payer: Self-pay | Admitting: Family Medicine

## 2022-05-15 DIAGNOSIS — G8929 Other chronic pain: Secondary | ICD-10-CM

## 2022-05-15 DIAGNOSIS — D571 Sickle-cell disease without crisis: Secondary | ICD-10-CM

## 2022-05-15 MED ORDER — OXYCODONE HCL 20 MG PO TABS: 20.0000 mg | ORAL_TABLET | Freq: Four times a day (QID) | ORAL | 0 refills | Status: DC | PRN

## 2022-05-15 NOTE — Progress Notes (Signed)
Reviewed PDMP substance reporting system prior to prescribing opiate medications. No inconsistencies noted.  Meds ordered this encounter  Medications   Oxycodone HCl 20 MG TABS    Sig: Take 1 tablet (20 mg total) by mouth every 6 (six) hours as needed.    Dispense:  30 tablet    Refill:  0    Order Specific Question:   Supervising Provider    Answer:   [name], [name] [[id number]]   Kenyia Wambolt [name] Thurmon Mizell  APRN, MSN, FNP-C [facility] [address]  [place], [place] [address] [phone]  

## 2022-05-18 ENCOUNTER — Other Ambulatory Visit: Payer: Self-pay

## 2022-05-25 ENCOUNTER — Other Ambulatory Visit: Payer: Self-pay

## 2022-05-27 ENCOUNTER — Other Ambulatory Visit: Payer: Self-pay

## 2022-05-27 DIAGNOSIS — G8929 Other chronic pain: Secondary | ICD-10-CM

## 2022-05-27 DIAGNOSIS — D571 Sickle-cell disease without crisis: Secondary | ICD-10-CM

## 2022-05-27 NOTE — Telephone Encounter (Signed)
Please advise due to [place] being out of the office .

## 2022-05-27 NOTE — Telephone Encounter (Signed)
From: Marylin Crosby To: Office of Cammie Sickle, [place] Sent: 05/27/2022 10:06 AM EDT Subject: Medication Renewal Request  Refills have been requested for the following medications:   Oxycodone HCl 20 MG TABS [Lachina Hollis]  Preferred pharmacy: [id number] - [place], [place] - [address] Delivery method: [employer]

## 2022-05-31 ENCOUNTER — Other Ambulatory Visit: Payer: Self-pay | Admitting: Family Medicine

## 2022-05-31 ENCOUNTER — Other Ambulatory Visit: Payer: Self-pay

## 2022-05-31 DIAGNOSIS — G8929 Other chronic pain: Secondary | ICD-10-CM

## 2022-05-31 DIAGNOSIS — D571 Sickle-cell disease without crisis: Secondary | ICD-10-CM

## 2022-05-31 MED ORDER — OXYCODONE HCL 20 MG PO TABS: 20.0000 mg | ORAL_TABLET | Freq: Four times a day (QID) | ORAL | 0 refills | Status: DC | PRN

## 2022-05-31 NOTE — Telephone Encounter (Signed)
Please advise Kh 

## 2022-05-31 NOTE — Telephone Encounter (Signed)
From: [name] To: Office of Julianne Handler, [place] Sent: 05/29/2022 11:40 AM EDT Subject: Medication Renewal Request  Refills have been requested for the following medications:   Oxycodone HCl 20 MG TABS [Lachina Hollis]  Preferred pharmacy: [facility] #[id number] - [place], [place] - [address] Delivery method: [employer]

## 2022-05-31 NOTE — Progress Notes (Signed)
Reviewed PDMP substance reporting system prior to prescribing opiate medications. No inconsistencies noted.  Meds ordered this encounter  Medications   Oxycodone HCl 20 MG TABS    Sig: Take 1 tablet (20 mg total) by mouth every 6 (six) hours as needed.    Dispense:  30 tablet    Refill:  0    Order Specific Question:   Supervising Provider    Answer:   [name], [name] [[id number]]   Sharyon Peitz [name] Quavis Klutz  APRN, MSN, FNP-C [facility] [address]  , [place] [address] [phone]  

## 2022-06-09 ENCOUNTER — Other Ambulatory Visit: Payer: Self-pay

## 2022-06-09 DIAGNOSIS — D571 Sickle-cell disease without crisis: Secondary | ICD-10-CM

## 2022-06-09 DIAGNOSIS — G8929 Other chronic pain: Secondary | ICD-10-CM

## 2022-06-09 NOTE — Telephone Encounter (Signed)
From: [name] To: Office of Julianne Handler, [place] Sent: 06/09/2022 9:46 AM EDT Subject: Medication Renewal Request  Refills have been requested for the following medications:   Oxycodone HCl 20 MG TABS [Paul Campos]  Preferred pharmacy: [facility] STORE #[id number] - [place], [place] - [address] Delivery method: [employer]

## 2022-06-09 NOTE — Telephone Encounter (Signed)
Please advise KH 

## 2022-06-10 ENCOUNTER — Other Ambulatory Visit: Payer: Self-pay | Admitting: Family Medicine

## 2022-06-10 DIAGNOSIS — G8929 Other chronic pain: Secondary | ICD-10-CM

## 2022-06-10 DIAGNOSIS — D571 Sickle-cell disease without crisis: Secondary | ICD-10-CM

## 2022-06-10 MED ORDER — OXYCODONE HCL 20 MG PO TABS: 20.0000 mg | ORAL_TABLET | Freq: Four times a day (QID) | ORAL | 0 refills | Status: DC | PRN

## 2022-06-10 NOTE — Progress Notes (Signed)
Reviewed PDMP substance reporting system prior to prescribing opiate medications. No inconsistencies noted.  Meds ordered this encounter  Medications   Oxycodone HCl 20 MG TABS    Sig: Take 1 tablet (20 mg total) by mouth every 6 (six) hours as needed.    Dispense:  30 tablet    Refill:  0    Order Specific Question:   Supervising Provider    Answer:   [name], [name] [[id number]]   Dniya Neuhaus [name] Bindi Klomp  APRN, MSN, FNP-C [facility] [address]  [place], [place] [address] [phone]  

## 2022-06-21 ENCOUNTER — Other Ambulatory Visit: Payer: Self-pay

## 2022-06-21 DIAGNOSIS — D571 Sickle-cell disease without crisis: Secondary | ICD-10-CM

## 2022-06-21 DIAGNOSIS — G8929 Other chronic pain: Secondary | ICD-10-CM

## 2022-06-21 NOTE — Telephone Encounter (Signed)
Please advise KH 

## 2022-06-21 NOTE — Telephone Encounter (Signed)
From: [name] To: Office of Julianne Handler, [place] Sent: 06/19/2022 11:37 AM EDT Subject: Medication Renewal Request  Refills have been requested for the following medications:   Oxycodone HCl 20 MG TABS [Lachina Hollis]  Preferred pharmacy: [facility] DR[facility] STORE #[id number] - [place], [place] - [address] Delivery method: [employer]

## 2022-06-22 ENCOUNTER — Other Ambulatory Visit: Payer: Self-pay | Admitting: Family Medicine

## 2022-06-22 DIAGNOSIS — G8929 Other chronic pain: Secondary | ICD-10-CM

## 2022-06-22 DIAGNOSIS — D571 Sickle-cell disease without crisis: Secondary | ICD-10-CM

## 2022-06-22 MED ORDER — OXYCODONE HCL 20 MG PO TABS: 20.0000 mg | ORAL_TABLET | Freq: Four times a day (QID) | ORAL | 0 refills | Status: DC | PRN

## 2022-06-22 NOTE — Progress Notes (Signed)
Reviewed PDMP substance reporting system prior to prescribing opiate medications. No inconsistencies noted.  Meds ordered this encounter  Medications   Oxycodone HCl 20 MG TABS    Sig: Take 1 tablet (20 mg total) by mouth every 6 (six) hours as needed.    Dispense:  30 tablet    Refill:  0    Order Specific Question:   Supervising Provider    Answer:   [name], [name] [[id number]]   Retina Bernardy [name] Emmalena Canny  APRN, MSN, FNP-C [facility] [address]  [place], [place] [address] [phone]  

## 2022-06-29 ENCOUNTER — Ambulatory Visit: Payer: Medicaid Other | Admitting: Family Medicine

## 2022-06-29 ENCOUNTER — Encounter: Payer: Self-pay | Admitting: Family Medicine

## 2022-06-29 VITALS — BP 104/76 | HR 68 | Temp 97.2°F | Ht 72.0 in | Wt 188.4 lb

## 2022-06-29 DIAGNOSIS — G8929 Other chronic pain: Secondary | ICD-10-CM

## 2022-06-29 DIAGNOSIS — E559 Vitamin D deficiency, unspecified: Secondary | ICD-10-CM | POA: Diagnosis not present

## 2022-06-29 DIAGNOSIS — D571 Sickle-cell disease without crisis: Secondary | ICD-10-CM

## 2022-06-29 NOTE — Progress Notes (Unsigned)
Established Patient Office Visit  Subjective   Patient ID: Paul Campos, male    DOB: 23-Nov-1990  Age: 32 y.o. 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   Acute pain of left shoulder 07/29/2016   Migraine without aura and without status migrainosus, not intractable 11/04/2015   Episodic headache 07/17/2015   Fever 10/01/2012   Sepsis (HCC) 10/01/2012   Anemia 09/30/2012   Hypokalemia 08/21/2012   Thrombocytopenia, unspecified (HCC) 08/19/2012   CAP (community acquired pneumonia) 08/18/2012   Pneumonia, unspecified organism 08/18/2012   Thrombocytopenia (HCC) 06/12/2012   Sickle cell pain crisis (HCC) 05/06/2012   Chest pain 05/06/2012   Vitamin D deficiency 11/02/2011   Cholelithiasis 10/14/2011   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   Splenomegaly 10/14/2011   Chronic pain 10/14/2011   Sickle cell anemia (HCC) 10/14/2011   Past Medical History:  Diagnosis Date   Headache    Sickle cell anemia (HCC)    Past Surgical History:  Procedure Laterality Date   APPENDECTOMY     Social History   Tobacco Use   Smoking status: Never   Smokeless tobacco: Never  Vaping Use   Vaping Use: Never used  Substance Use Topics   Alcohol use: No   Drug use: No   Social History   Socioeconomic History   Marital status: Single    Spouse name: Not on file   Number of children: 1   Years of education: Not on file   Highest education level: GED or equivalent  Occupational History    Comment: none 09/03/19  Tobacco Use   Smoking status: Never   Smokeless  tobacco: Never  Vaping Use   Vaping Use: Never used  Substance and Sexual Activity   Alcohol use: No   Drug use: No   Sexual activity: Yes    Birth control/protection: Condom  Other Topics Concern   Not on file  Social History Narrative   Caffeine- maybe coffee every other morning   Social Determinants of Health   Financial Resource Strain: Low Risk  (06/28/2022)   Overall Financial Resource Strain (CARDIA)    Difficulty of Paying Living Expenses: Not hard at all  Food Insecurity: No Food Insecurity (06/28/2022)   Hunger Vital Sign    Worried About Running Out of Food in the Last Year: Never true    Ran Out of Food in the Last Year: Never true  Transportation Needs: No Transportation Needs (06/28/2022)   PRAPARE - Administrator, Civil Service (Medical): No    Lack of Transportation (Non-Medical): No  Physical Activity: Insufficiently Active (06/28/2022)   Exercise Vital Sign    Days of Exercise per Week: 2 days    Minutes of Exercise per Session: 30 min  Stress: No Stress Concern Present (06/28/2022)   [employer] of Occupational Health - Occupational Stress Questionnaire    Feeling of Stress : Not at all  Social Connections: Socially Isolated (06/28/2022)   Social Connection and Isolation Panel [NHANES]    Frequency of Communication with  Friends and Family: Twice a week    Frequency of Social Gatherings with Friends and Family: Once a week    Attends Religious Services: Never    Database administrator or Organizations: No    Attends Engineer, structural: Not on file    Marital Status: Never married  Catering manager Violence: Not on file   Family Status  Relation Name Status   Mother  Deceased   Father  Alive   Sister  Alive   Brother  Alive   Brother  Alive   Brother  Alive   Brother  Alive   MGM  Alive   MGF  Deceased   PGM  Deceased   PGF  Deceased   Family History  Problem Relation Age of Onset   Lung cancer Mother    Migraines Mother     Allergies  Allergen Reactions   Dilaudid [Hydromorphone Hcl] Hives   Hydromorphone Itching    Morphine okay.   Cefotaxime Itching and Rash    Hives      ROS    Objective:     BP 104/76   Pulse 68   Temp (!) 97.2 F (36.2 C)   Ht 6' (1.829 m)   Wt 188 lb 6.4 oz (85.5 kg)   SpO2 100%   BMI 25.55 kg/m  BP Readings from Last 3 Encounters:  06/29/22 104/76  03/30/22 110/71  01/24/22 114/82   Wt Readings from Last 3 Encounters:  06/29/22 188 lb 6.4 oz (85.5 kg)  03/30/22 185 lb 12.8 oz (84.3 kg)  01/23/22 180 lb (81.6 kg)      Physical Exam Constitutional:      Appearance: Normal appearance.  Eyes:     Pupils: Pupils are equal, round, and reactive to light.  Cardiovascular:     Rate and Rhythm: Normal rate and regular rhythm.     Pulses: Normal pulses.  Pulmonary:     Effort: Pulmonary effort is normal.  Abdominal:     General: Bowel sounds are normal.  Musculoskeletal:        General: Normal range of motion.  Skin:    General: Skin is warm.  Neurological:     General: No focal deficit present.     Mental Status: He is alert. Mental status is at baseline.  Psychiatric:        Mood and Affect: Mood normal.        Behavior: Behavior normal.        Thought Content: Thought content normal.        Judgment: Judgment normal.      No results found for any visits on 06/29/22.  Last CBC Lab Results  Component Value Date   WBC 10.8 03/30/2022   HGB 12.0 (L) 03/30/2022   HCT 37.5 03/30/2022   MCV 77 (L) 03/30/2022   MCH 24.5 (L) 03/30/2022   RDW 17.7 (H) 03/30/2022   PLT 230 03/30/2022   Last metabolic panel Lab Results  Component Value Date   GLUCOSE 87 03/30/2022   NA 137 03/30/2022   K 4.2 03/30/2022   CL 99 03/30/2022   CO2 24 03/30/2022   BUN 5 (L) 03/30/2022   CREATININE 0.89 03/30/2022   EGFR 118 03/30/2022   CALCIUM 9.8 03/30/2022   PROT 7.1 03/30/2022   ALBUMIN 4.2 (L) 03/30/2022   LABGLOB 2.9 03/30/2022   AGRATIO 1.4 03/30/2022    BILITOT 0.6 03/30/2022   ALKPHOS 76 03/30/2022   AST 18 03/30/2022  ALT 11 03/30/2022   ANIONGAP 9 01/23/2022   Last lipids No results found for: "CHOL", "HDL", "LDLCALC", "LDLDIRECT", "TRIG", "CHOLHDL" Last hemoglobin A1c No results found for: "HGBA1C" Last thyroid functions No results found for: "TSH", "T3TOTAL", "T4TOTAL", "THYROIDAB" Last vitamin D Lab Results  Component Value Date   VD25OH 16.1 (L) 03/30/2022   Last vitamin B12 and Folate No results found for: "VITAMINB12", "FOLATE"    The ASCVD Risk score (Arnett DK, et al., 2019) failed to calculate for the following reasons:   The 2019 ASCVD risk score is only valid for ages 54 to 34    Assessment & Plan:   Problem List Items Addressed This Visit       Other   Vitamin D deficiency   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     Nolon Nations  APRN, MSN, FNP-C Patient Care [facility] [address] [place], [place] [address] [phone]

## 2022-06-30 ENCOUNTER — Other Ambulatory Visit: Payer: Self-pay

## 2022-06-30 DIAGNOSIS — G8929 Other chronic pain: Secondary | ICD-10-CM

## 2022-06-30 DIAGNOSIS — D571 Sickle-cell disease without crisis: Secondary | ICD-10-CM

## 2022-06-30 LAB — CMP14+CBC/D/PLT+FER+RETIC+V...
ALT: 13 IU/L (ref 0–44)
AST: 19 IU/L (ref 0–40)
Albumin/Globulin Ratio: 1.3 (ref 1.2–2.2)
Albumin: 4.2 g/dL — ABNORMAL LOW (ref 4.3–5.2)
Alkaline Phosphatase: 75 IU/L (ref 44–121)
BUN/Creatinine Ratio: 7 — ABNORMAL LOW (ref 9–20)
BUN: 6 mg/dL (ref 6–20)
Basophils Absolute: 0 10*3/uL (ref 0.0–0.2)
Basos: 1 %
Bilirubin Total: 0.6 mg/dL (ref 0.0–1.2)
CO2: 19 mmol/L — ABNORMAL LOW (ref 20–29)
Calcium: 9.2 mg/dL (ref 8.7–10.2)
Chloride: 104 mmol/L (ref 96–106)
Creatinine, Ser: 0.84 mg/dL (ref 0.76–1.27)
EOS (ABSOLUTE): 0.4 10*3/uL (ref 0.0–0.4)
Eos: 6 %
Ferritin: 192 ng/mL (ref 30–400)
Globulin, Total: 3.2 g/dL (ref 1.5–4.5)
Glucose: 90 mg/dL (ref 70–99)
Hematocrit: 39.7 % (ref 37.5–51.0)
Hemoglobin: 12.7 g/dL — ABNORMAL LOW (ref 13.0–17.7)
Immature Grans (Abs): 0 10*3/uL (ref 0.0–0.1)
Immature Granulocytes: 0 %
Lymphocytes Absolute: 2 10*3/uL (ref 0.7–3.1)
Lymphs: 30 %
MCH: 25 pg — ABNORMAL LOW (ref 26.6–33.0)
MCHC: 32 g/dL (ref 31.5–35.7)
MCV: 78 fL — ABNORMAL LOW (ref 79–97)
Monocytes Absolute: 0.9 10*3/uL (ref 0.1–0.9)
Monocytes: 14 %
Neutrophils Absolute: 3.3 10*3/uL (ref 1.4–7.0)
Neutrophils: 49 %
Platelets: 202 10*3/uL (ref 150–450)
Potassium: 4.4 mmol/L (ref 3.5–5.2)
RBC: 5.09 x10E6/uL (ref 4.14–5.80)
RDW: 17.6 % — ABNORMAL HIGH (ref 11.6–15.4)
Retic Ct Pct: 4.5 % — ABNORMAL HIGH (ref 0.6–2.6)
Sodium: 140 mmol/L (ref 134–144)
Total Protein: 7.4 g/dL (ref 6.0–8.5)
Vit D, 25-Hydroxy: 14.5 ng/mL — ABNORMAL LOW (ref 30.0–100.0)
WBC: 6.7 10*3/uL (ref 3.4–10.8)
eGFR: 120 mL/min/{1.73_m2} (ref >59)

## 2022-06-30 MED ORDER — OXYCODONE HCL 20 MG PO TABS
20.0000 mg | ORAL_TABLET | Freq: Four times a day (QID) | ORAL | 0 refills | Status: DC | PRN
Start: 2022-06-30 — End: 2022-07-08

## 2022-06-30 NOTE — Telephone Encounter (Signed)
Please advise KH 

## 2022-06-30 NOTE — Telephone Encounter (Signed)
From: [name] To: Office of Julianne Handler, [place] Sent: 06/30/2022 11:30 AM EDT Subject: Medication Renewal Request  Refills have been requested for the following medications:   Oxycodone HCl 20 MG TABS [Lachina Hollis]  Preferred pharmacy: [facility] #[id number] - [place], [place] - [address] Delivery method: [employer]

## 2022-07-02 ENCOUNTER — Other Ambulatory Visit: Payer: Self-pay | Admitting: Family Medicine

## 2022-07-02 DIAGNOSIS — E559 Vitamin D deficiency, unspecified: Secondary | ICD-10-CM

## 2022-07-02 MED ORDER — ERGOCALCIFEROL 1.25 MG (50000 UT) PO CAPS: 50000.0000 [IU] | ORAL_CAPSULE | ORAL | 3 refills | Status: DC

## 2022-07-02 NOTE — Progress Notes (Signed)
Reviewed all laboratory values, patient's vitamin D is markedly decreased.  Recommend that patient continues weekly vitamin D at 50,000 IUs.  Also, recommend vitamin D fortified diet that includes foods such as yogurt, almond milk, mackerel, salmon, etc.  Will recheck at 46-month follow-up.  Please ensure that patient has a 58-month follow-up appointment scheduled.  Nolon Nations  APRN, MSN, FNP-C Patient Care [facility] [address] [place], [place] [address] [phone]

## 2022-07-03 LAB — DRUG SCREEN 764883 11+OXYCO+ALC+CRT-BUND
Amphetamines, Urine: NEGATIVE ng/mL
BENZODIAZ UR QL: NEGATIVE ng/mL
Barbiturate: NEGATIVE ng/mL
Cannabinoid Quant, Ur: NEGATIVE ng/mL
Cocaine (Metabolite): NEGATIVE ng/mL
Creatinine: 81.6 mg/dL (ref 20.0–300.0)
Ethanol: NEGATIVE %
Meperidine: NEGATIVE ng/mL
Methadone Screen, Urine: NEGATIVE ng/mL
OPIATE SCREEN URINE: NEGATIVE ng/mL
Phencyclidine: NEGATIVE ng/mL
Propoxyphene: NEGATIVE ng/mL
Tramadol: NEGATIVE ng/mL
pH, Urine: 6.4 (ref 4.5–8.9)

## 2022-07-03 LAB — OXYCODONE/OXYMORPHONE, CONFIRM
OXYCODONE/OXYMORPH: POSITIVE — AB
OXYCODONE: NEGATIVE
OXYMORPHONE (GC/MS): 407 ng/mL
OXYMORPHONE: POSITIVE — AB

## 2022-07-08 ENCOUNTER — Other Ambulatory Visit: Payer: Self-pay | Admitting: Family Medicine

## 2022-07-08 ENCOUNTER — Other Ambulatory Visit: Payer: Self-pay

## 2022-07-08 DIAGNOSIS — D571 Sickle-cell disease without crisis: Secondary | ICD-10-CM

## 2022-07-08 DIAGNOSIS — G8929 Other chronic pain: Secondary | ICD-10-CM

## 2022-07-08 MED ORDER — OXYCODONE HCL 20 MG PO TABS
20.0000 mg | ORAL_TABLET | Freq: Four times a day (QID) | ORAL | 0 refills | Status: DC | PRN
Start: 2022-07-09 — End: 2022-07-20

## 2022-07-08 NOTE — Telephone Encounter (Signed)
From: [name] To: Office of Julianne Handler, [place] Sent: 07/08/2022 11:17 AM EDT Subject: Medication Renewal Request  Refills have been requested for the following medications:   Oxycodone HCl 20 MG TABS [Lachina Hollis]  Preferred pharmacy: [facility] #[id number] - [place], [place] - [address] AT [facility] [address] Delivery method: [employer]

## 2022-07-08 NOTE — Progress Notes (Signed)
Reviewed PDMP substance reporting system prior to prescribing opiate medications. No inconsistencies noted.  Meds ordered this encounter  Medications   Oxycodone HCl 20 MG TABS    Sig: Take 1 tablet (20 mg total) by mouth every 6 (six) hours as needed.    Dispense:  30 tablet    Refill:  0    Order Specific Question:   Supervising Provider    Answer:   [name], [name] [[id number]]   Paul Kozloski [name] Amorette Charrette  APRN, MSN, FNP-C [facility] [address]  [place], [place] [address] [phone]  

## 2022-07-08 NOTE — Telephone Encounter (Signed)
Please advise KH 

## 2022-07-15 ENCOUNTER — Other Ambulatory Visit: Payer: Self-pay

## 2022-07-15 DIAGNOSIS — G8929 Other chronic pain: Secondary | ICD-10-CM

## 2022-07-15 DIAGNOSIS — D571 Sickle-cell disease without crisis: Secondary | ICD-10-CM

## 2022-07-15 NOTE — Telephone Encounter (Signed)
Please advise in [place] absence. KH 

## 2022-07-15 NOTE — Telephone Encounter (Signed)
From: [name] To: Office of Julianne Handler, [place] Sent: 07/15/2022 9:20 AM EDT Subject: Medication Renewal Request  Refills have been requested for the following medications:   Oxycodone HCl 20 MG TABS [Paul Campos]  Preferred pharmacy: [facility]ORE #[id number] - [place], [place] - [address] Delivery method: [employer]

## 2022-07-20 ENCOUNTER — Other Ambulatory Visit: Payer: Self-pay

## 2022-07-20 DIAGNOSIS — G8929 Other chronic pain: Secondary | ICD-10-CM

## 2022-07-20 DIAGNOSIS — D571 Sickle-cell disease without crisis: Secondary | ICD-10-CM

## 2022-07-20 MED ORDER — OXYCODONE HCL 20 MG PO TABS
20.0000 mg | ORAL_TABLET | Freq: Four times a day (QID) | ORAL | 0 refills | Status: DC | PRN
Start: 2022-07-24 — End: 2022-08-03

## 2022-07-20 NOTE — Telephone Encounter (Signed)
Reviewed PDMP substance reporting system prior to prescribing opiate medications. No inconsistencies noted.  Meds ordered this encounter  Medications   Oxycodone HCl 20 MG TABS    Sig: Take 1 tablet (20 mg total) by mouth every 6 (six) hours as needed.    Dispense:  30 tablet    Refill:  0   Nolon Nations  APRN, MSN, FNP-C Patient Care [facility] [address] [place], [place] [address] [phone]

## 2022-07-20 NOTE — Telephone Encounter (Signed)
From: [name] To: Office of Julianne Handler, [place] Sent: 07/20/2022 10:43 AM EDT Subject: Medication Renewal Request  Refills have been requested for the following medications:   Oxycodone HCl 20 MG TABS [Lachina Hollis]  Preferred pharmacy: [facility] #[id number] - [place], [place] - [address] Delivery method: [employer]

## 2022-07-20 NOTE — Telephone Encounter (Signed)
Please advise Kh 

## 2022-08-02 ENCOUNTER — Other Ambulatory Visit: Payer: Self-pay

## 2022-08-02 DIAGNOSIS — G8929 Other chronic pain: Secondary | ICD-10-CM

## 2022-08-02 DIAGNOSIS — D571 Sickle-cell disease without crisis: Secondary | ICD-10-CM

## 2022-08-02 NOTE — Telephone Encounter (Signed)
Please advise KH 

## 2022-08-02 NOTE — Telephone Encounter (Signed)
From: [name] To: Office of Julianne Handler, [place] Sent: 08/01/2022 8:40 PM EDT Subject: Medication Renewal Request  Refills have been requested for the following medications:   Oxycodone HCl 20 MG TABS [Paul Campos]  Preferred pharmacy: [facility]ORE #[id number] - [place], [place] - [address] Delivery method: [employer]

## 2022-08-03 ENCOUNTER — Other Ambulatory Visit: Payer: Self-pay | Admitting: Family Medicine

## 2022-08-03 DIAGNOSIS — D571 Sickle-cell disease without crisis: Secondary | ICD-10-CM

## 2022-08-03 DIAGNOSIS — G8929 Other chronic pain: Secondary | ICD-10-CM

## 2022-08-03 MED ORDER — OXYCODONE HCL 20 MG PO TABS
20.0000 mg | ORAL_TABLET | Freq: Four times a day (QID) | ORAL | 0 refills | Status: DC | PRN
Start: 2022-08-03 — End: 2022-08-12

## 2022-08-03 NOTE — Progress Notes (Signed)
Reviewed PDMP substance reporting system prior to prescribing opiate medications. No inconsistencies noted.  Meds ordered this encounter  Medications   Oxycodone HCl 20 MG TABS    Sig: Take 1 tablet (20 mg total) by mouth every 6 (six) hours as needed.    Dispense:  30 tablet    Refill:  0    Order Specific Question:   Supervising Provider    Answer:   [name], [name] [[id number]]   Jarred Purtee [name] [place] Holdsworth  APRN, MSN, FNP-C [facility] [address]  [place], [place] [address] [phone]  

## 2022-08-11 ENCOUNTER — Other Ambulatory Visit: Payer: Self-pay

## 2022-08-11 DIAGNOSIS — G8929 Other chronic pain: Secondary | ICD-10-CM

## 2022-08-11 DIAGNOSIS — D571 Sickle-cell disease without crisis: Secondary | ICD-10-CM

## 2022-08-11 NOTE — Telephone Encounter (Signed)
From: [name] To: Office of Julianne Handler, [place] Sent: 08/11/2022 4:29 PM EDT Subject: Medication Renewal Request  Refills have been requested for the following medications:   Oxycodone HCl 20 MG TABS [Lachina Hollis]  Preferred pharmacy: [facility] DRUG STORE #[id number] - [place], [place] - [address] Delivery method: [employer]

## 2022-08-11 NOTE — Telephone Encounter (Signed)
Please advise KH 

## 2022-08-12 ENCOUNTER — Other Ambulatory Visit: Payer: Self-pay | Admitting: Family Medicine

## 2022-08-12 DIAGNOSIS — D571 Sickle-cell disease without crisis: Secondary | ICD-10-CM

## 2022-08-12 DIAGNOSIS — G8929 Other chronic pain: Secondary | ICD-10-CM

## 2022-08-12 MED ORDER — OXYCODONE HCL 20 MG PO TABS
20.0000 mg | ORAL_TABLET | Freq: Four times a day (QID) | ORAL | 0 refills | Status: DC | PRN
Start: 2022-08-12 — End: 2022-08-23

## 2022-08-12 NOTE — Progress Notes (Signed)
Reviewed PDMP substance reporting system prior to prescribing opiate medications. No inconsistencies noted.  Meds ordered this encounter  Medications   Oxycodone HCl 20 MG TABS    Sig: Take 1 tablet (20 mg total) by mouth every 6 (six) hours as needed.    Dispense:  30 tablet    Refill:  0    Order Specific Question:   Supervising Provider    Answer:   [name], [name] [[id number]]   Ronya Gilcrest [name] Abron Neddo  APRN, MSN, FNP-C [facility] [address]  [place], [place] [address] [phone]  

## 2022-08-20 ENCOUNTER — Other Ambulatory Visit: Payer: Self-pay | Admitting: Family Medicine

## 2022-08-20 ENCOUNTER — Other Ambulatory Visit: Payer: Self-pay

## 2022-08-20 DIAGNOSIS — G8929 Other chronic pain: Secondary | ICD-10-CM

## 2022-08-20 DIAGNOSIS — D571 Sickle-cell disease without crisis: Secondary | ICD-10-CM

## 2022-08-20 NOTE — Telephone Encounter (Signed)
From: [name] To: Office of Julianne Handler, [place] Sent: 08/19/2022 4:51 PM EDT Subject: Medication Renewal Request  Refills have been requested for the following medications:   Oxycodone HCl 20 MG TABS [Lachina Hollis]  Preferred pharmacy: [facility] STORE #[id number] - [place], [place] - [address] Delivery method: [employer]

## 2022-08-20 NOTE — Telephone Encounter (Signed)
Please advised. KH 

## 2022-08-23 ENCOUNTER — Other Ambulatory Visit: Payer: Self-pay

## 2022-08-23 ENCOUNTER — Other Ambulatory Visit: Payer: Self-pay | Admitting: Family Medicine

## 2022-08-23 DIAGNOSIS — G8929 Other chronic pain: Secondary | ICD-10-CM

## 2022-08-23 DIAGNOSIS — D571 Sickle-cell disease without crisis: Secondary | ICD-10-CM

## 2022-08-23 MED ORDER — OXYCODONE HCL 20 MG PO TABS
20.00 mg | ORAL_TABLET | Freq: Four times a day (QID) | ORAL | 0 refills | Status: DC | PRN
Start: 2022-08-23 — End: 2022-08-31

## 2022-08-23 NOTE — Telephone Encounter (Signed)
Please advise KH 

## 2022-08-23 NOTE — Telephone Encounter (Signed)
From: [name] To: Office of Julianne Handler, [place] Sent: 08/22/2022 9:26 AM EDT Subject: Medication Renewal Request  Refills have been requested for the following medications:   Oxycodone HCl 20 MG TABS [Lachina Hollis]  Preferred pharmacy: [facility] STORE #[id number] - [place], [place] - [address] Delivery method: [employer]

## 2022-08-23 NOTE — Progress Notes (Signed)
Reviewed PDMP substance reporting system prior to prescribing opiate medications. No inconsistencies noted.  Meds ordered this encounter  Medications   Oxycodone HCl 20 MG TABS    Sig: Take 1 tablet (20 mg total) by mouth every 6 (six) hours as needed.    Dispense:  30 tablet    Refill:  0    Order Specific Question:   Supervising Provider    Answer:   [name], [name] [[id number]]   Paul Nehme [name] Cheyenne Bordeaux  APRN, MSN, FNP-C [facility] [address]  [place], [place] [address] [phone]  

## 2022-08-31 ENCOUNTER — Other Ambulatory Visit: Payer: Self-pay | Admitting: Family Medicine

## 2022-08-31 ENCOUNTER — Other Ambulatory Visit: Payer: Self-pay

## 2022-08-31 DIAGNOSIS — G8929 Other chronic pain: Secondary | ICD-10-CM

## 2022-08-31 DIAGNOSIS — D571 Sickle-cell disease without crisis: Secondary | ICD-10-CM

## 2022-08-31 MED ORDER — OXYCODONE HCL 20 MG PO TABS
20.0000 mg | ORAL_TABLET | Freq: Four times a day (QID) | ORAL | 0 refills | Status: DC | PRN
Start: 2022-08-31 — End: 2022-09-17

## 2022-08-31 NOTE — Progress Notes (Signed)
Reviewed PDMP substance reporting system prior to prescribing opiate medications. No inconsistencies noted.   Meds ordered this encounter  Medications   Oxycodone HCl 20 MG TABS    Sig: Take 1 tablet (20 mg total) by mouth every 6 (six) hours as needed.    Dispense:  60 tablet    Refill:  0    Order Specific Question:   Supervising Provider    Answer:   [name], [name] [[id number]]      Alexios Keown [name] Rebecah Dangerfield  APRN, MSN, FNP-C [facility] [address]  , [place] [address] [phone]  

## 2022-08-31 NOTE — Telephone Encounter (Signed)
From: [name] To: Office of Julianne Handler, [place] Sent: 08/31/2022 9:36 AM EDT Subject: Medication Renewal Request  Refills have been requested for the following medications:   Oxycodone HCl 20 MG TABS [Lachina Hollis]  Preferred pharmacy: [facility] STORE #[id number] - [place], [place] - [address] Delivery method: [employer]

## 2022-08-31 NOTE — Telephone Encounter (Signed)
Please advise KH 

## 2022-09-02 ENCOUNTER — Other Ambulatory Visit: Payer: Self-pay

## 2022-09-02 ENCOUNTER — Telehealth: Payer: Self-pay

## 2022-09-02 NOTE — Telephone Encounter (Signed)
A prior authorization request for Oxycodone has been submitted to insurance today via CoverMyMeds Key: [id number]

## 2022-09-03 ENCOUNTER — Other Ambulatory Visit: Payer: Self-pay

## 2022-09-03 ENCOUNTER — Telehealth: Payer: Self-pay

## 2022-09-03 NOTE — Telephone Encounter (Signed)
A prior authorization request for Oxycodone has been approved by insurance until 03/01/2023.

## 2022-09-17 ENCOUNTER — Other Ambulatory Visit: Payer: Self-pay

## 2022-09-17 DIAGNOSIS — G8929 Other chronic pain: Secondary | ICD-10-CM

## 2022-09-17 DIAGNOSIS — D571 Sickle-cell disease without crisis: Secondary | ICD-10-CM

## 2022-09-17 MED ORDER — OXYCODONE HCL 20 MG PO TABS
20.0000 mg | ORAL_TABLET | Freq: Four times a day (QID) | ORAL | 0 refills | Status: DC | PRN
Start: 2022-09-17 — End: 2022-09-23

## 2022-09-17 NOTE — Telephone Encounter (Signed)
Please advise Kh 

## 2022-09-23 ENCOUNTER — Other Ambulatory Visit: Payer: Self-pay | Admitting: Family Medicine

## 2022-09-23 DIAGNOSIS — D571 Sickle-cell disease without crisis: Secondary | ICD-10-CM

## 2022-09-23 DIAGNOSIS — G8929 Other chronic pain: Secondary | ICD-10-CM

## 2022-09-23 MED ORDER — OXYCODONE HCL 20 MG PO TABS
20.0000 mg | ORAL_TABLET | Freq: Four times a day (QID) | ORAL | 0 refills | Status: DC | PRN
Start: 2022-09-23 — End: 2022-11-04

## 2022-09-23 NOTE — Telephone Encounter (Signed)
Reviewed PDMP substance reporting system prior to prescribing opiate medications. No inconsistencies noted.   Meds ordered this encounter  Medications   Oxycodone HCl 20 MG TABS    Sig: Take 1 tablet (20 mg total) by mouth every 6 (six) hours as needed.    Dispense:  60 tablet    Refill:  0    Order Specific Question:   Supervising Provider    Answer:   [name], [name] [[id number]]      Lachina [name] Hollis  APRN, MSN, FNP-C Patient Care [facility] [address]  , [place] [address] [phone]  

## 2022-09-28 ENCOUNTER — Other Ambulatory Visit: Payer: Self-pay

## 2022-09-28 ENCOUNTER — Encounter: Payer: Self-pay | Admitting: Family Medicine

## 2022-09-28 ENCOUNTER — Ambulatory Visit: Payer: Medicaid Other | Admitting: Family Medicine

## 2022-09-28 VITALS — BP 107/68 | HR 84 | Temp 97.9°F | Ht 72.0 in | Wt 183.6 lb

## 2022-09-28 DIAGNOSIS — E559 Vitamin D deficiency, unspecified: Secondary | ICD-10-CM

## 2022-09-28 DIAGNOSIS — D571 Sickle-cell disease without crisis: Secondary | ICD-10-CM

## 2022-09-28 DIAGNOSIS — G8929 Other chronic pain: Secondary | ICD-10-CM | POA: Diagnosis not present

## 2022-09-28 NOTE — Progress Notes (Signed)
Established Patient Office Visit  Subjective   Patient ID: Paul Campos, male    DOB: 06/05/1990  Age: 32 y.o. MRN: [id number]  Chief Complaint  Patient presents with   Sickle Cell Anemia    Paul Campos is a 32 year old male with a medical history significant for sickle cell disease, chronic pain syndrome, opiate dependence and tolerance, and anemia of chronic disease that was admitted for sickle cell pain crisis.  Patient has chronic pain primarily to his low back and lower extremities.  Today, patient rates pain as 5/10 and he last had oxycodone this a.m. with some relief.  He currently denies any headache, chest pain, urinary symptoms, nausea, vomiting, or diarrhea.  Patient was recently hospitalized for sickle cell pain crisis several weeks ago at [facility].  He states that pain intensity has improved since previous hospital admission.    Patient Active Problem List   Diagnosis Date Noted   Elevated troponin 10/20/2020   Leukocytosis 05/07/2020   Chronic pain syndrome 05/07/2020   Acute sickle cell crisis (HCC) 05/06/2020   Nausea & vomiting 11/05/2018   Sickle cell anemia with crisis (HCC) 11/04/2018   Hb-SS disease without crisis (HCC) 01/02/2018   Acute pain of left shoulder 07/29/2016   Migraine without aura and without status migrainosus, not intractable 11/04/2015   Episodic headache 07/17/2015   Fever 10/01/2012   Sepsis (HCC) 10/01/2012   Anemia 09/30/2012   Hypokalemia 08/21/2012   Thrombocytopenia, unspecified (HCC) 08/19/2012   CAP (community acquired pneumonia) 08/18/2012   Pneumonia, unspecified organism 08/18/2012   Thrombocytopenia (HCC) 06/12/2012   Sickle cell pain crisis (HCC) 05/06/2012   Chest pain 05/06/2012   Vitamin D deficiency 11/02/2011   Cholelithiasis 10/14/2011   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   Splenomegaly 10/14/2011   Chronic pain 10/14/2011   Sickle cell anemia (HCC) 10/14/2011   Past  Medical History:  Diagnosis Date   Headache    Sickle cell anemia (HCC)    Past Surgical History:  Procedure Laterality Date   APPENDECTOMY     Social History   Tobacco Use   Smoking status: Never   Smokeless tobacco: Never  Vaping Use   Vaping status: Never Used  Substance Use Topics   Alcohol use: No   Drug use: No   Social History   Socioeconomic History   Marital status: Single    Spouse name: Not on file   Number of children: 1   Years of education: Not on file   Highest education level: GED or equivalent  Occupational History    Comment: none 09/03/19  Tobacco Use   Smoking status: Never   Smokeless tobacco: Never  Vaping Use   Vaping status: Never Used  Substance and Sexual Activity   Alcohol use: No   Drug use: No   Sexual activity: Yes    Birth control/protection: Condom  Other Topics Concern   Not on file  Social History Narrative   Caffeine- maybe coffee every other morning   Social Determinants of Health   Financial Resource Strain: Low Risk  (08/21/2022)   Received from [facility], [facility]   Overall Financial Resource Strain (CARDIA)    Difficulty of Paying Living Expenses: Not hard at all  Food Insecurity: No Food Insecurity (08/21/2022)   Received from [facility], [facility]   Hunger Vital Sign    Worried About Running Out of Food in the Last Year: Never  true    Ran Out of Food in the Last Year: Never true  Transportation Needs: No Transportation Needs (08/21/2022)   Received from [facility], [facility]   [facility] - Transportation    In the past 12 months, has lack of transportation kept you from medical appointments or from getting medications?: No    Lack of Transportation (Non-Medical): No  Physical Activity: Insufficiently Active (06/28/2022)   Exercise Vital Sign    Days of Exercise per Week: 2 days    Minutes of Exercise per  Session: 30 min  Stress: No Stress Concern Present (06/28/2022)   [employer] of Occupational Health - Occupational Stress Questionnaire    Feeling of Stress : Not at all  Social Connections: Socially Isolated (06/28/2022)   Social Connection and Isolation Panel [NHANES]    Frequency of Communication with Friends and Family: Twice a week    Frequency of Social Gatherings with Friends and Family: Once a week    Attends Religious Services: Never    Database administrator or Organizations: No    Attends Engineer, structural: Not on file    Marital Status: Never married  Intimate Partner Violence: Not At Risk (06/26/2021)   Received from [facility], [facility]   Humiliation, Afraid, Rape, and Kick questionnaire    Fear of Current or Ex-Partner: No    Emotionally Abused: No    Physically Abused: No    Sexually Abused: No   Family Status  Relation Name Status   Mother  Deceased   Father  Alive   Sister  Alive   Brother  Alive   Brother  Alive   Brother  Alive   Brother  Alive   MGM  Alive   MGF  Deceased   PGM  Deceased   PGF  Deceased  No partnership data on file   Family History  Problem Relation Age of Onset   Lung cancer Mother    Migraines Mother    Allergies  Allergen Reactions   Dilaudid [Hydromorphone Hcl] Hives   Hydromorphone Itching    Morphine okay.   Cefotaxime Itching and Rash    Hives      Review of Systems  Constitutional: Negative.   HENT: Negative.    Eyes: Negative.   Respiratory: Negative.    Cardiovascular: Negative.   Genitourinary: Negative.   Musculoskeletal:  Positive for back pain and joint pain.  Skin: Negative.   Neurological: Negative.   Psychiatric/Behavioral: Negative.        Objective:     BP 107/68   Pulse 84   Temp 97.9 F (36.6 C)   Ht 6' (1.829 m)   Wt 183 lb 9.6 oz (83.3 kg)   SpO2 100%   BMI 24.90 kg/m  BP Readings from Last 3 Encounters:  09/28/22 107/68  06/29/22 104/76  03/30/22 110/71    Wt Readings from Last 3 Encounters:  09/28/22 183 lb 9.6 oz (83.3 kg)  06/29/22 188 lb 6.4 oz (85.5 kg)  03/30/22 185 lb 12.8 oz (84.3 kg)      Physical Exam Constitutional:      Appearance: Normal appearance.  Eyes:     Pupils: Pupils are equal, round, and reactive to light.  Cardiovascular:     Rate and Rhythm: Normal rate and regular rhythm.  Pulmonary:     Effort: Pulmonary effort is normal.  Musculoskeletal:        General: Normal range of  motion.  Skin:    General: Skin is warm.  Neurological:     General: No focal deficit present.     Mental Status: He is alert. Mental status is at baseline.  Psychiatric:        Mood and Affect: Mood normal.        Behavior: Behavior normal.        Thought Content: Thought content normal.        Judgment: Judgment normal.      No results found for any visits on 09/28/22.  Last CBC Lab Results  Component Value Date   WBC 6.7 06/29/2022   HGB 12.7 (L) 06/29/2022   HCT 39.7 06/29/2022   MCV 78 (L) 06/29/2022   MCH 25.0 (L) 06/29/2022   RDW 17.6 (H) 06/29/2022   PLT 202 06/29/2022   Last metabolic panel Lab Results  Component Value Date   GLUCOSE 90 06/29/2022   NA 140 06/29/2022   K 4.4 06/29/2022   CL 104 06/29/2022   CO2 19 (L) 06/29/2022   BUN 6 06/29/2022   CREATININE 0.84 06/29/2022   EGFR 120 06/29/2022   CALCIUM 9.2 06/29/2022   PROT 7.4 06/29/2022   ALBUMIN 4.2 (L) 06/29/2022   LABGLOB 3.2 06/29/2022   AGRATIO 1.3 06/29/2022   BILITOT 0.6 06/29/2022   ALKPHOS 75 06/29/2022   AST 19 06/29/2022   ALT 13 06/29/2022   ANIONGAP 9 01/23/2022   Last lipids No results found for: "CHOL", "HDL", "LDLCALC", "LDLDIRECT", "TRIG", "CHOLHDL" Last hemoglobin A1c No results found for: "HGBA1C" Last thyroid functions No results found for: "TSH", "T3TOTAL", "T4TOTAL", "THYROIDAB" Last vitamin D Lab Results  Component Value Date   VD25OH 14.5 (L) 06/29/2022   Last vitamin B12 and Folate No results found for:  "VITAMINB12", "FOLATE"    The ASCVD Risk score (Arnett DK, et al., 2019) failed to calculate for the following reasons:   The 2019 ASCVD risk score is only valid for ages 61 to 36    Assessment & Plan:   Problem List Items Addressed This Visit       Other   Vitamin D deficiency   Relevant Orders   Sickle Cell Panel   Hb-SS disease without crisis (HCC) - Primary   Relevant Orders   Sickle Cell Panel   Chronic pain   Relevant Orders   [id number] 11+Oxyco+Alc+Crt-Bund   1. Hb-SS disease without crisis (HCC)  - Sickle Cell Panel  2. Vitamin D deficiency  - Sickle Cell Panel  3. Other chronic pain Reviewed PDMP substance reporting system prior to prescribing opiate medications. No inconsistencies noted.   - [id number] 11+Oxyco+Alc+Crt-Bund  Return in about 3 months (around 12/29/2022) for sickle cell anemia.   Nolon Nations  APRN, MSN, FNP-C Patient Care [facility] [address] [place], [place] [address] [phone]

## 2022-09-28 NOTE — Telephone Encounter (Signed)
Please advise KH 

## 2022-10-05 ENCOUNTER — Other Ambulatory Visit: Payer: Self-pay

## 2022-10-05 DIAGNOSIS — G8929 Other chronic pain: Secondary | ICD-10-CM

## 2022-10-05 DIAGNOSIS — D571 Sickle-cell disease without crisis: Secondary | ICD-10-CM

## 2022-10-05 NOTE — Telephone Encounter (Signed)
Please advise Paul Campos 

## 2022-11-04 ENCOUNTER — Other Ambulatory Visit: Payer: Self-pay | Admitting: Family Medicine

## 2022-11-04 DIAGNOSIS — D571 Sickle-cell disease without crisis: Secondary | ICD-10-CM

## 2022-11-04 DIAGNOSIS — G8929 Other chronic pain: Secondary | ICD-10-CM

## 2022-11-04 MED ORDER — OXYCODONE HCL 20 MG PO TABS: 20.0000 mg | ORAL_TABLET | Freq: Four times a day (QID) | ORAL | 0 refills | Status: DC | PRN

## 2022-11-04 NOTE — Telephone Encounter (Signed)
Reviewed PDMP substance reporting system prior to prescribing opiate medications. No inconsistencies noted.  Meds ordered this encounter  Medications   Oxycodone HCl 20 MG TABS    Sig: Take 1 tablet (20 mg total) by mouth every 6 (six) hours as needed.    Dispense:  30 tablet    Refill:  0   Nolon Nations  APRN, MSN, FNP-C Patient Care [facility] [address] [place], [place] [address] [phone]

## 2022-11-22 ENCOUNTER — Other Ambulatory Visit: Payer: Self-pay | Admitting: Family Medicine

## 2022-11-22 DIAGNOSIS — D571 Sickle-cell disease without crisis: Secondary | ICD-10-CM

## 2022-11-22 DIAGNOSIS — G8929 Other chronic pain: Secondary | ICD-10-CM

## 2022-11-22 MED ORDER — OXYCODONE HCL 20 MG PO TABS: 20.0000 mg | ORAL_TABLET | Freq: Four times a day (QID) | ORAL | 0 refills | Status: DC | PRN

## 2022-11-22 NOTE — Telephone Encounter (Signed)
Reviewed PDMP substance reporting system prior to prescribing opiate medications. No inconsistencies noted.  Meds ordered this encounter  Medications   Oxycodone HCl 20 MG TABS    Sig: Take 1 tablet (20 mg total) by mouth every 6 (six) hours as needed.    Dispense:  30 tablet    Refill:  0    Order Specific Question:   Supervising Provider    Answer:   [name], [name] [[id number]]   Paul Sahni [name] Yaritzi Craun  APRN, MSN, FNP-C [facility] [address]  [place], [place] [address] [phone]  

## 2022-12-06 ENCOUNTER — Other Ambulatory Visit: Payer: Self-pay | Admitting: Family Medicine

## 2022-12-06 ENCOUNTER — Other Ambulatory Visit: Payer: Self-pay

## 2022-12-06 DIAGNOSIS — D571 Sickle-cell disease without crisis: Secondary | ICD-10-CM

## 2022-12-06 DIAGNOSIS — G8929 Other chronic pain: Secondary | ICD-10-CM

## 2022-12-06 MED ORDER — OXYCODONE HCL 20 MG PO TABS: 20.0000 mg | ORAL_TABLET | Freq: Four times a day (QID) | ORAL | 0 refills | Status: DC | PRN

## 2022-12-06 NOTE — Telephone Encounter (Signed)
Please advise KH 

## 2022-12-06 NOTE — Progress Notes (Signed)
Reviewed PDMP substance reporting system prior to prescribing opiate medications. No inconsistencies noted.  Meds ordered this encounter  Medications   Oxycodone HCl 20 MG TABS    Sig: Take 1 tablet (20 mg total) by mouth every 6 (six) hours as needed.    Dispense:  30 tablet    Refill:  0    Order Specific Question:   Supervising Provider    Answer:   Quentin Angst [[id number]]   Nolon Nations  APRN, MSN, FNP-C Patient Care [facility] [address] [place], [place] [address] [phone]

## 2022-12-31 ENCOUNTER — Other Ambulatory Visit: Payer: Self-pay | Admitting: Family Medicine

## 2022-12-31 DIAGNOSIS — D571 Sickle-cell disease without crisis: Secondary | ICD-10-CM

## 2022-12-31 DIAGNOSIS — G8929 Other chronic pain: Secondary | ICD-10-CM

## 2022-12-31 MED ORDER — OXYCODONE HCL 20 MG PO TABS: 20.0000 mg | ORAL_TABLET | Freq: Four times a day (QID) | ORAL | 0 refills | Status: DC | PRN

## 2022-12-31 NOTE — Telephone Encounter (Signed)
Reviewed PDMP substance reporting system prior to prescribing opiate medications. No inconsistencies noted.  Meds ordered this encounter  Medications   Oxycodone HCl 20 MG TABS    Sig: Take 1 tablet (20 mg total) by mouth every 6 (six) hours as needed.    Dispense:  30 tablet    Refill:  0    Order Specific Question:   Supervising Provider    Answer:   [name], [name] [[id number]]   Paul Sahni [name] Yaritzi Craun  APRN, MSN, FNP-C [facility] [address]  [place], [place] [address] [phone]  

## 2023-01-04 ENCOUNTER — Encounter: Payer: Self-pay | Admitting: Family Medicine

## 2023-01-04 ENCOUNTER — Ambulatory Visit: Payer: Medicaid Other | Admitting: Family Medicine

## 2023-01-04 VITALS — BP 111/73 | HR 65 | Temp 97.4°F | Wt 181.0 lb

## 2023-01-04 DIAGNOSIS — E559 Vitamin D deficiency, unspecified: Secondary | ICD-10-CM

## 2023-01-04 DIAGNOSIS — D5700 Hb-SS disease with crisis, unspecified: Secondary | ICD-10-CM

## 2023-01-04 DIAGNOSIS — G8929 Other chronic pain: Secondary | ICD-10-CM | POA: Diagnosis not present

## 2023-01-04 NOTE — Patient Instructions (Signed)
Living With Sickle Cell Disease Living with a long-term condition, such as sickle cell disease, can be a challenge. It can affect both your physical and mental health. You may not have total control over your condition. But proper care and treatment can help manage the effects of the disease so you can feel good and lead an active life. You can take steps to manage your condition and stay as healthy as possible. How does sickle cell disease affect me? Sickle cell disease can cause challenges that affect your quality of life. You may get sick more often as a result of organ damage and infections. Sometimes you may need to stay in the hospital. Learn how to recognize that you are not feeling well and that you may be getting sick. What actions can I take to manage my condition?  The goals of treatment are to control your symptoms and prevent and treat problems. Work with your health care provider to create a treatment plan that works for you. Taking an active role in managing your condition can help you feel more in control of your situation. Ask about possible side effects of medicines that your health care provider recommends. Discuss how you feel about having those side effects. Keeping a healthy lifestyle can help you manage your condition. This includes eating a healthy diet, getting enough sleep, and getting regular exercise. Sickle cell disease may affect your ability to take care of your basic needs. Tell your health care provider if you have concerns about any of these needs: Access to food. Housing. Safe drinking water and other utilities. Safety in your home and community. Work or school. Transportation. Paying for health care. Your health care provider may be able to connect you with community resources that can help you. How to manage stress  Living with sickle cell disease can be stressful. This disease can have a big impact on your mental health. Talk with your health care provider  about ways to reduce your stress or if you have concerns about your mental health.  To cope with stress, try: Keeping a stress diary. This can help you learn what causes your stress to start (figure out your triggers) and how to control your response to those triggers. Spending time doing things that you enjoy, such as: Hobbies. Being outdoors. Spending time with friends and people who make you laugh. Doing yoga, muscle relaxation, deep breathing, or mindfulness practices. Expressing yourself through journal writing, art, crafting, poetry, or playing music. Staying positive about your health. Try to accept that you cannot control your condition perfectly. Follow these instructions at home: Medicines Take over-the-counter and prescription medicines only as told by your health care provider. If you were prescribed antibiotics, take them as told by your health care provider. Do not stop taking them even if you start to feel better. If you develop a fever, do not take medicines to reduce the fever right away. This could cover up another problem. Contact your health care provider. Eating and drinking Drink enough fluid to keep your urine pale yellow. Drink more in hot weather and during exercise. Limit or avoid drinking alcohol. Eat a balanced and nutritious diet. Eat plenty of fruits, vegetables, whole grains, and lean protein. Take vitamins and supplements as told by your health care provider. Traveling When traveling, keep these with you: Your medical information. The names of your health care providers. Your medicines. If you have to travel by air, ask about precautions you should take. Managing pain Work with  your health care provider to create a pain management plan that works for you. The plan may include: Ways to reduce or manage your pain at home, such as: Using a heating pad. Taking a warm bath. Using healthy ways to distract you from the pain, such as hobbies or  reading. Practicing ways to relax, such as doing yoga or listening to music. Getting massages. Doing exercises or stretches as told by a physical therapist. Tracking how pain affects your daily life functions. When to seek help. Who to contact and what to do in case of a pain emergency. General instructions Do not use any products that contain nicotine or tobacco. These products include cigarettes, chewing tobacco, and vaping devices, such as e-cigarettes. These lower blood oxygen levels. If you need help quitting, ask your health care provider. Consider wearing a medical alert bracelet. Use an app or journal to track your symptoms, assess your level of pain and fatigue, and keep track of your medicines. Avoid the following: High altitudes. Very high or low temperatures and big changes in temperature. Activities that will lower your oxygen levels, such as mountain climbing or doing exercise that takes a lot of effort. Stay up to date on: Your treatment plan. Learn as much as you can about your condition. Health screenings. This will help prevent problems or catch them early on. Vaccines. This will help prevent infection. Wash your hands often with soap and water to help prevent infections. Wash them for at least 20 seconds each time. Keep all follow-up visits. Regular follow-up with your health care provider can help you better manage your condition. Where to find support You can find help and support through: Talking with a therapist or taking part in support groups. Sickle Cell Disease Foundation of [place]: www.sicklecelldisease.org Where to find more information Centers for Disease Control and Prevention: FootballExhibition.com.br American Society of Hematology: www.hematology.org Contact a health care provider if: Your symptoms get worse. You have new symptoms. You have a fever. Get help right away if: You have a painful erection of the penis that lasts a long time (priapism). You become  short of breath or are having trouble breathing. You have pain that cannot be controlled with medicine. You have any signs of a stroke. "BE FAST" is an easy way to remember the main warning signs: B - Balance. Dizziness, sudden trouble walking, or loss of balance. E - Eyes. Trouble seeing or a change in how you see. F - Face. Sudden weakness or loss of feeling of the face. The face or eyelid may droop on one side. A - Arms. Weakness or loss of feeling in an arm. This happens all of a sudden and most often on one side of the body. S - Speech. Sudden trouble speaking, slurred speech, or trouble understanding what people say. T - Time. Time to call emergency services. Write down what time symptoms started. You have other signs of a stroke, such as: A sudden, very bad headache with no known cause. Feeling like you may vomit (nausea). Vomiting. Seizure. These symptoms may be an emergency. Get help right away. Call 911. Do not wait to see if the symptoms will go away. Do not drive yourself to the hospital. Also, get help right away if: You have strong feelings of sadness or loss of hope, or you have thoughts about hurting yourself or others. Take one of these steps if you feel like you may hurt yourself or others, or have thoughts about taking your own life:  Go to your nearest emergency room. Call 911. Call the National Suicide Prevention Lifeline at (380) 276-8495 or 988. This is open 24 hours a day. Text the Crisis Text Line at 425-206-2876. Summary Proper care and treatment can help manage the effects of sickle cell disease so you can feel good and lead an active life. The goals of treatment are to control your symptoms and prevent and treat problems. Taking an active role in managing your condition can help you feel more in control of your situation. Work with your health care provider to create a pain management plan that works for you. Get medical help right away as told by your health care  provider. This information is not intended to replace advice given to you by your health care provider. Make sure you discuss any questions you have with your health care provider. Document Revised: 05/18/2021 Document Reviewed: 05/18/2021 Elsevier Patient Education  2024 [employer].

## 2023-01-04 NOTE — Progress Notes (Signed)
Established Patient Office Visit  Subjective   Patient ID: Paul Campos, male    DOB: January 03, 1991  Age: 31 y.o. MRN: [id number]  Chief Complaint  Patient presents with   Medical Management of Chronic Issues   Sickle Cell Anemia    Paul Campos is a very pleasant 32 year old male with a medical history significant for sickle cell disease, chronic pain syndrome, opiate dependence and tolerance, and anemia of chronic disease presents for follow-up of chronic conditions.  Patient states that he is doing well today and does not have any new complaints.  He has a history of chronic pain that is primarily to his low back and lower extremities. Today, patient rates pain at 5/10.  He states that he is continue daily folic acid and hydroxyurea for his sickle cell disease. Patient denies any fever, chills, chest pain, shortness of breath, urinary symptoms, nausea, vomiting, or diarrhea.    Patient Active Problem List   Diagnosis Date Noted   Elevated troponin 10/20/2020   Leukocytosis 05/07/2020   Chronic pain syndrome 05/07/2020   Acute sickle cell crisis (HCC) 05/06/2020   Nausea & vomiting 11/05/2018   Sickle cell anemia with crisis (HCC) 11/04/2018   Hb-SS disease without crisis (HCC) 01/02/2018   Acute pain of left shoulder 07/29/2016   Migraine without aura and without status migrainosus, not intractable 11/04/2015   Episodic headache 07/17/2015   Fever 10/01/2012   Sepsis (HCC) 10/01/2012   Anemia 09/30/2012   Hypokalemia 08/21/2012   Thrombocytopenia, unspecified (HCC) 08/19/2012   CAP (community acquired pneumonia) 08/18/2012   Pneumonia, unspecified organism 08/18/2012   Thrombocytopenia (HCC) 06/12/2012   Sickle cell pain crisis (HCC) 05/06/2012   Chest pain 05/06/2012   Vitamin D deficiency 11/02/2011   Cholelithiasis 10/14/2011   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   Splenomegaly 10/14/2011   Chronic pain 10/14/2011   Sickle cell anemia (HCC)  10/14/2011   Past Medical History:  Diagnosis Date   Headache    Sickle cell anemia (HCC)    Past Surgical History:  Procedure Laterality Date   APPENDECTOMY     Social History   Tobacco Use   Smoking status: Never   Smokeless tobacco: Never  Vaping Use   Vaping status: Never Used  Substance Use Topics   Alcohol use: No   Drug use: No   Social History   Socioeconomic History   Marital status: Single    Spouse name: Not on file   Number of children: 1   Years of education: Not on file   Highest education level: GED or equivalent  Occupational History    Comment: none 09/03/19  Tobacco Use   Smoking status: Never   Smokeless tobacco: Never  Vaping Use   Vaping status: Never Used  Substance and Sexual Activity   Alcohol use: No   Drug use: No   Sexual activity: Yes    Birth control/protection: Condom  Other Topics Concern   Not on file  Social History Narrative   Caffeine- maybe coffee every other morning   Social Determinants of Health   Financial Resource Strain: Low Risk  (12/29/2022)   Overall Financial Resource Strain (CARDIA)    Difficulty of Paying Living Expenses: Not very hard  Food Insecurity: No Food Insecurity (12/29/2022)   Hunger Vital Sign    Worried About Running Out of Food in the Last Year: Never true    Ran Out of Food in the Last Year: Never true  Transportation Needs: No  Transportation Needs (12/29/2022)   PRAPARE - Administrator, Civil Service (Medical): No    Lack of Transportation (Non-Medical): No  Physical Activity: Insufficiently Active (12/29/2022)   Exercise Vital Sign    Days of Exercise per Week: 3 days    Minutes of Exercise per Session: 20 min  Stress: No Stress Concern Present (12/29/2022)   [employer] of Occupational Health - Occupational Stress Questionnaire    Feeling of Stress : Not at all  Social Connections: Socially Isolated (12/29/2022)   Social Connection and Isolation Panel [NHANES]    Frequency  of Communication with Friends and Family: Three times a week    Frequency of Social Gatherings with Friends and Family: Once a week    Attends Religious Services: Never    Database administrator or Organizations: No    Attends Engineer, structural: Not on file    Marital Status: Never married  Intimate Partner Violence: Not At Risk (06/26/2021)   Received from [facility], [facility]   Humiliation, Afraid, Rape, and Kick questionnaire    Fear of Current or Ex-Partner: No    Emotionally Abused: No    Physically Abused: No    Sexually Abused: No   Family Status  Relation Name Status   Mother  Deceased   Father  Alive   Sister  Alive   Brother  Alive   Brother  Alive   Brother  Alive   Brother  Alive   MGM  Alive   MGF  Deceased   PGM  Deceased   PGF  Deceased  No partnership data on file   Family History  Problem Relation Age of Onset   Lung cancer Mother    Migraines Mother    Allergies  Allergen Reactions   Dilaudid [Hydromorphone Hcl] Hives   Hydromorphone Itching    Morphine okay.   Cefotaxime Itching and Rash    Hives      ROS    Objective:     BP 111/73   Pulse 65   Temp (!) 97.4 F (36.3 C)   Wt 181 lb (82.1 kg)   SpO2 100%   BMI 24.55 kg/m  BP Readings from Last 3 Encounters:  01/04/23 111/73  09/28/22 107/68  06/29/22 104/76   Wt Readings from Last 3 Encounters:  01/04/23 181 lb (82.1 kg)  09/28/22 183 lb 9.6 oz (83.3 kg)  06/29/22 188 lb 6.4 oz (85.5 kg)      Physical Exam Constitutional:      Appearance: Normal appearance.  Eyes:     Pupils: Pupils are equal, round, and reactive to light.  Cardiovascular:     Rate and Rhythm: Normal rate and regular rhythm.  Pulmonary:     Effort: Pulmonary effort is normal.  Abdominal:     General: Bowel sounds are normal.  Musculoskeletal:        General: Normal range of motion.  Neurological:     Mental Status: He is alert.      No results found for any visits on  01/04/23.  Last CBC Lab Results  Component Value Date   WBC 9.2 09/28/2022   HGB 11.1 (L) 09/28/2022   HCT 35.4 (L) 09/28/2022   MCV 76 (L) 09/28/2022   MCH 23.7 (L) 09/28/2022   RDW 18.8 (H) 09/28/2022   PLT 207 09/28/2022   Last metabolic panel Lab Results  Component Value Date   GLUCOSE 99 09/28/2022   NA 142  09/28/2022   K 4.2 09/28/2022   CL 104 09/28/2022   CO2 23 09/28/2022   BUN 7 09/28/2022   CREATININE 0.91 09/28/2022   EGFR 116 09/28/2022   CALCIUM 9.7 09/28/2022   PROT 7.5 09/28/2022   ALBUMIN 4.5 09/28/2022   LABGLOB 3.0 09/28/2022   AGRATIO 1.3 06/29/2022   BILITOT 0.9 09/28/2022   ALKPHOS 69 09/28/2022   AST 29 09/28/2022   ALT 19 09/28/2022   ANIONGAP 9 01/23/2022   Last lipids No results found for: "CHOL", "HDL", "LDLCALC", "LDLDIRECT", "TRIG", "CHOLHDL" Last hemoglobin A1c No results found for: "HGBA1C" Last thyroid functions No results found for: "TSH", "T3TOTAL", "T4TOTAL", "THYROIDAB" Last vitamin D Lab Results  Component Value Date   VD25OH 26.3 (L) 09/28/2022   Last vitamin B12 and Folate No results found for: "VITAMINB12", "FOLATE"    The ASCVD Risk score (Arnett DK, et al., 2019) failed to calculate for the following reasons:   The 2019 ASCVD risk score is only valid for ages 40 to 29    Assessment & Plan:   Problem List Items Addressed This Visit       Other   Vitamin D deficiency   Relevant Orders   Sickle Cell Panel   Chronic pain   Relevant Orders   [id number] 11+Oxyco+Alc+Crt-Bund   Other Visit Diagnoses     Sickle cell anemia with pain (HCC)    -  Primary   Relevant Orders   Sickle Cell Panel      1. Sickle cell anemia with pain [facility]) Patient will continue hydroxyurea 500 mg daily.  Medication will not be increased at that time due to nonadherence to medication regimen.  Discussed the importance of taking medication consistently in order to achieve positive outcomes.  Will follow-up by phone with any abnormal lab  results. - Sickle Cell Panel  2. Vitamin D deficiency  - Sickle Cell Panel  3. Other chronic pain  - [id number] 11+Oxyco+Alc+Crt-Bund  Return in about 3 months (around 04/06/2023) for sickle cell anemia.   Nolon Nations  APRN, MSN, FNP-C [facility] [address] [place], [place] [address] [phone]

## 2023-01-05 LAB — CMP14+CBC/D/PLT+FER+RETIC+V...
ALT: 10 [IU]/L (ref 0–44)
AST: 22 [IU]/L (ref 0–40)
Albumin: 4.4 g/dL (ref 4.3–5.2)
Alkaline Phosphatase: 77 [IU]/L (ref 44–121)
BUN/Creatinine Ratio: 5 — ABNORMAL LOW (ref 9–20)
BUN: 4 mg/dL — ABNORMAL LOW (ref 6–20)
Basophils Absolute: 0.1 10*3/uL (ref 0.0–0.2)
Basos: 1 %
Bilirubin Total: 0.8 mg/dL (ref 0.0–1.2)
CO2: 23 mmol/L (ref 20–29)
Calcium: 9.6 mg/dL (ref 8.7–10.2)
Chloride: 103 mmol/L (ref 96–106)
Creatinine, Ser: 0.88 mg/dL (ref 0.76–1.27)
EOS (ABSOLUTE): 0.5 10*3/uL — ABNORMAL HIGH (ref 0.0–0.4)
Eos: 8 %
Ferritin: 241 ng/mL (ref 30–400)
Globulin, Total: 2.9 g/dL (ref 1.5–4.5)
Glucose: 81 mg/dL (ref 70–99)
Hematocrit: 37.4 % — ABNORMAL LOW (ref 37.5–51.0)
Hemoglobin: 11.5 g/dL — ABNORMAL LOW (ref 13.0–17.7)
Immature Grans (Abs): 0 10*3/uL (ref 0.0–0.1)
Immature Granulocytes: 0 %
Lymphocytes Absolute: 2.6 10*3/uL (ref 0.7–3.1)
Lymphs: 38 %
MCH: 25.2 pg — ABNORMAL LOW (ref 26.6–33.0)
MCHC: 30.7 g/dL — ABNORMAL LOW (ref 31.5–35.7)
MCV: 82 fL (ref 79–97)
Monocytes Absolute: 1.3 10*3/uL — ABNORMAL HIGH (ref 0.1–0.9)
Monocytes: 19 %
Neutrophils Absolute: 2.3 10*3/uL (ref 1.4–7.0)
Neutrophils: 34 %
Platelets: 256 10*3/uL (ref 150–450)
Potassium: 4.3 mmol/L (ref 3.5–5.2)
RBC: 4.57 x10E6/uL (ref 4.14–5.80)
RDW: 19.8 % — ABNORMAL HIGH (ref 11.6–15.4)
Retic Ct Pct: 3.2 % — ABNORMAL HIGH (ref 0.6–2.6)
Sodium: 143 mmol/L (ref 134–144)
Total Protein: 7.3 g/dL (ref 6.0–8.5)
Vit D, 25-Hydroxy: 16.9 ng/mL — ABNORMAL LOW (ref 30.0–100.0)
WBC: 6.9 10*3/uL (ref 3.4–10.8)
eGFR: 119 mL/min/{1.73_m2} (ref >59)

## 2023-01-08 LAB — DRUG SCREEN 764883 11+OXYCO+ALC+CRT-BUND
Amphetamines, Urine: NEGATIVE ng/mL
BENZODIAZ UR QL: NEGATIVE ng/mL
Barbiturate: NEGATIVE ng/mL
Cannabinoid Quant, Ur: NEGATIVE ng/mL
Cocaine (Metabolite): NEGATIVE ng/mL
Creatinine: 157.7 mg/dL (ref 20.0–300.0)
Ethanol: NEGATIVE %
Meperidine: NEGATIVE ng/mL
Methadone Screen, Urine: NEGATIVE ng/mL
Phencyclidine: NEGATIVE ng/mL
Propoxyphene: NEGATIVE ng/mL
Tramadol: NEGATIVE ng/mL
pH, Urine: 5.6 (ref 4.5–8.9)

## 2023-01-08 LAB — OXYCODONE/OXYMORPHONE, CONFIRM
OXYCODONE/OXYMORPH: POSITIVE — AB
OXYCODONE: >3000 ng/mL
OXYCODONE: POSITIVE — AB
OXYMORPHONE (GC/MS): 2864 ng/mL
OXYMORPHONE: POSITIVE — AB

## 2023-01-08 LAB — OPIATES CONFIRMATION, URINE: Opiates: NEGATIVE ng/mL

## 2023-01-13 ENCOUNTER — Other Ambulatory Visit: Payer: Self-pay | Admitting: Family Medicine

## 2023-01-13 DIAGNOSIS — D571 Sickle-cell disease without crisis: Secondary | ICD-10-CM

## 2023-01-13 DIAGNOSIS — G8929 Other chronic pain: Secondary | ICD-10-CM

## 2023-01-13 MED ORDER — OXYCODONE HCL 20 MG PO TABS: 20.0000 mg | ORAL_TABLET | Freq: Four times a day (QID) | ORAL | 0 refills | Status: DC | PRN

## 2023-01-13 NOTE — Telephone Encounter (Signed)
Reviewed PDMP substance reporting system prior to prescribing opiate medications. No inconsistencies noted.  Meds ordered this encounter  Medications   Oxycodone HCl 20 MG TABS    Sig: Take 1 tablet (20 mg total) by mouth every 6 (six) hours as needed.    Dispense:  30 tablet    Refill:  0    Order Specific Question:   Supervising Provider    Answer:   [name], [name] [[id number]]   Paul Sahni [name] Yaritzi Craun  APRN, MSN, FNP-C [facility] [address]  [place], [place] [address] [phone]  

## 2023-01-26 ENCOUNTER — Other Ambulatory Visit: Payer: Self-pay | Admitting: Family Medicine

## 2023-01-26 DIAGNOSIS — G8929 Other chronic pain: Secondary | ICD-10-CM

## 2023-01-26 DIAGNOSIS — D571 Sickle-cell disease without crisis: Secondary | ICD-10-CM

## 2023-01-26 MED ORDER — OXYCODONE HCL 20 MG PO TABS: 20.0000 mg | ORAL_TABLET | Freq: Four times a day (QID) | ORAL | 0 refills | Status: DC | PRN

## 2023-01-26 NOTE — Telephone Encounter (Signed)
Reviewed PDMP substance reporting system prior to prescribing opiate medications. No inconsistencies noted.  Meds ordered this encounter  Medications   Oxycodone HCl 20 MG TABS    Sig: Take 1 tablet (20 mg total) by mouth every 6 (six) hours as needed.    Dispense:  30 tablet    Refill:  0    Order Specific Question:   Supervising Provider    Answer:   [name], [name] [[id number]]   Airon Sahni [name] Yaritzi Craun  APRN, MSN, FNP-C [facility] [address]  [place], [place] [address] [phone]  

## 2023-02-02 ENCOUNTER — Other Ambulatory Visit: Payer: Self-pay | Admitting: Family Medicine

## 2023-02-02 DIAGNOSIS — D571 Sickle-cell disease without crisis: Secondary | ICD-10-CM

## 2023-02-02 DIAGNOSIS — G8929 Other chronic pain: Secondary | ICD-10-CM

## 2023-02-02 MED ORDER — OXYCODONE HCL 20 MG PO TABS: 20.0000 mg | ORAL_TABLET | Freq: Four times a day (QID) | ORAL | 0 refills | Status: DC | PRN

## 2023-02-02 NOTE — Telephone Encounter (Signed)
Reviewed PDMP substance reporting system prior to prescribing opiate medications. No inconsistencies noted.  Meds ordered this encounter  Medications   Oxycodone HCl 20 MG TABS    Sig: Take 1 tablet (20 mg total) by mouth every 6 (six) hours as needed.    Dispense:  30 tablet    Refill:  0    Order Specific Question:   Supervising Provider    Answer:   [name], [name] [[id number]]   Airon Sahni [name] Yaritzi Craun  APRN, MSN, FNP-C [facility] [address]  [place], [place] [address] [phone]  

## 2023-02-17 ENCOUNTER — Other Ambulatory Visit: Payer: Self-pay

## 2023-02-17 DIAGNOSIS — D571 Sickle-cell disease without crisis: Secondary | ICD-10-CM

## 2023-02-17 DIAGNOSIS — G8929 Other chronic pain: Secondary | ICD-10-CM

## 2023-02-17 MED ORDER — OXYCODONE HCL 20 MG PO TABS: 20.0000 mg | ORAL_TABLET | Freq: Four times a day (QID) | ORAL | 0 refills | Status: DC | PRN

## 2023-02-17 NOTE — Telephone Encounter (Signed)
Please advise KH 

## 2023-03-07 ENCOUNTER — Other Ambulatory Visit: Payer: Self-pay

## 2023-03-07 DIAGNOSIS — D571 Sickle-cell disease without crisis: Secondary | ICD-10-CM

## 2023-03-07 DIAGNOSIS — G8929 Other chronic pain: Secondary | ICD-10-CM

## 2023-03-07 MED ORDER — OXYCODONE HCL 20 MG PO TABS: 20.0000 mg | ORAL_TABLET | Freq: Four times a day (QID) | ORAL | 0 refills | Status: DC | PRN

## 2023-03-07 NOTE — Telephone Encounter (Signed)
 Pt has upcoming appt with Fola. Thank you.

## 2023-03-18 ENCOUNTER — Other Ambulatory Visit: Payer: Self-pay | Admitting: Internal Medicine

## 2023-03-18 DIAGNOSIS — G8929 Other chronic pain: Secondary | ICD-10-CM

## 2023-03-18 DIAGNOSIS — D571 Sickle-cell disease without crisis: Secondary | ICD-10-CM

## 2023-03-18 NOTE — Telephone Encounter (Signed)
Please advise until he sees [place].

## 2023-03-20 ENCOUNTER — Other Ambulatory Visit: Payer: Self-pay | Admitting: Internal Medicine

## 2023-03-20 DIAGNOSIS — G8929 Other chronic pain: Secondary | ICD-10-CM

## 2023-03-20 DIAGNOSIS — D571 Sickle-cell disease without crisis: Secondary | ICD-10-CM

## 2023-03-22 ENCOUNTER — Other Ambulatory Visit: Payer: Self-pay | Admitting: Internal Medicine

## 2023-03-22 DIAGNOSIS — G8929 Other chronic pain: Secondary | ICD-10-CM

## 2023-03-22 DIAGNOSIS — D571 Sickle-cell disease without crisis: Secondary | ICD-10-CM

## 2023-03-23 MED ORDER — OXYCODONE HCL 20 MG PO TABS
20.0000 mg | ORAL_TABLET | Freq: Four times a day (QID) | ORAL | 0 refills | Status: DC | PRN
Start: 2023-03-23 — End: 2023-04-26

## 2023-03-23 NOTE — Telephone Encounter (Signed)
Please advise pt has upcoming appt 04/06/23 with Fola. Thanks [employer]

## 2023-03-29 ENCOUNTER — Other Ambulatory Visit: Payer: Self-pay | Admitting: Internal Medicine

## 2023-03-29 DIAGNOSIS — G8929 Other chronic pain: Secondary | ICD-10-CM

## 2023-03-29 DIAGNOSIS — D571 Sickle-cell disease without crisis: Secondary | ICD-10-CM

## 2023-03-31 ENCOUNTER — Other Ambulatory Visit: Payer: Self-pay | Admitting: Internal Medicine

## 2023-03-31 DIAGNOSIS — D571 Sickle-cell disease without crisis: Secondary | ICD-10-CM

## 2023-03-31 DIAGNOSIS — G8929 Other chronic pain: Secondary | ICD-10-CM

## 2023-04-04 ENCOUNTER — Other Ambulatory Visit: Payer: Self-pay | Admitting: Internal Medicine

## 2023-04-04 DIAGNOSIS — D571 Sickle-cell disease without crisis: Secondary | ICD-10-CM

## 2023-04-04 DIAGNOSIS — G8929 Other chronic pain: Secondary | ICD-10-CM

## 2023-04-04 NOTE — Telephone Encounter (Signed)
 Please advise pt will see Fola 04/06/23. Please advise [facility]

## 2023-04-06 ENCOUNTER — Ambulatory Visit: Payer: Self-pay | Admitting: Nurse Practitioner

## 2023-04-11 ENCOUNTER — Other Ambulatory Visit: Payer: Self-pay | Admitting: Internal Medicine

## 2023-04-11 DIAGNOSIS — G8929 Other chronic pain: Secondary | ICD-10-CM

## 2023-04-11 DIAGNOSIS — D571 Sickle-cell disease without crisis: Secondary | ICD-10-CM

## 2023-04-12 NOTE — Telephone Encounter (Signed)
 Please advise [facility]

## 2023-04-26 ENCOUNTER — Other Ambulatory Visit: Payer: Self-pay | Admitting: Internal Medicine

## 2023-04-26 DIAGNOSIS — D571 Sickle-cell disease without crisis: Secondary | ICD-10-CM

## 2023-04-26 DIAGNOSIS — G8929 Other chronic pain: Secondary | ICD-10-CM

## 2023-04-26 MED ORDER — OXYCODONE HCL 20 MG PO TABS
20.0000 mg | ORAL_TABLET | Freq: Four times a day (QID) | ORAL | 0 refills | Status: DC | PRN
Start: 2023-04-26 — End: 2023-05-18

## 2023-04-27 NOTE — Telephone Encounter (Signed)
 Same as before. Can't decline. Thanks [employer]

## 2023-04-27 NOTE — Telephone Encounter (Signed)
 Please refuse. I do not have clearance to refuse. Thank you. KH

## 2023-04-29 ENCOUNTER — Ambulatory Visit: Payer: Self-pay | Admitting: Nurse Practitioner

## 2023-05-17 ENCOUNTER — Other Ambulatory Visit: Payer: Self-pay | Admitting: Internal Medicine

## 2023-05-17 DIAGNOSIS — D571 Sickle-cell disease without crisis: Secondary | ICD-10-CM

## 2023-05-17 DIAGNOSIS — G8929 Other chronic pain: Secondary | ICD-10-CM

## 2023-05-18 ENCOUNTER — Other Ambulatory Visit: Payer: Self-pay | Admitting: Nurse Practitioner

## 2023-05-18 DIAGNOSIS — G8929 Other chronic pain: Secondary | ICD-10-CM

## 2023-05-18 DIAGNOSIS — D571 Sickle-cell disease without crisis: Secondary | ICD-10-CM

## 2023-05-18 MED ORDER — OXYCODONE HCL 20 MG PO TABS: 20.0000 mg | ORAL_TABLET | Freq: Four times a day (QID) | ORAL | 0 refills | Status: DC | PRN

## 2023-05-20 ENCOUNTER — Other Ambulatory Visit: Payer: Self-pay | Admitting: Nurse Practitioner

## 2023-05-20 DIAGNOSIS — D571 Sickle-cell disease without crisis: Secondary | ICD-10-CM

## 2023-05-20 DIAGNOSIS — G8929 Other chronic pain: Secondary | ICD-10-CM

## 2023-05-23 ENCOUNTER — Encounter: Payer: Self-pay | Admitting: Nurse Practitioner

## 2023-05-23 ENCOUNTER — Ambulatory Visit: Payer: Medicaid Other | Admitting: Nurse Practitioner

## 2023-05-23 VITALS — BP 98/70 | HR 73 | Temp 97.0°F | Ht 73.0 in | Wt 180.6 lb

## 2023-05-23 DIAGNOSIS — D571 Sickle-cell disease without crisis: Secondary | ICD-10-CM

## 2023-05-23 DIAGNOSIS — G43009 Migraine without aura, not intractable, without status migrainosus: Secondary | ICD-10-CM | POA: Diagnosis not present

## 2023-05-23 DIAGNOSIS — E559 Vitamin D deficiency, unspecified: Secondary | ICD-10-CM | POA: Diagnosis not present

## 2023-05-23 MED ORDER — HYDROXYUREA 500 MG PO CAPS
1000.0000 mg | ORAL_CAPSULE | Freq: Every day | ORAL | 2 refills | Status: AC
Start: 2023-05-23 — End: ?

## 2023-05-23 MED ORDER — IBUPROFEN 800 MG PO TABS: 800.0000 mg | ORAL_TABLET | Freq: Three times a day (TID) | ORAL | 1 refills | Status: DC | PRN

## 2023-05-23 MED ORDER — ERGOCALCIFEROL 1.25 MG (50000 UT) PO CAPS: 50000.0000 [IU] | ORAL_CAPSULE | ORAL | 3 refills | Status: DC

## 2023-05-23 MED ORDER — FOLIC ACID 800 MCG PO TABS: 800.0000 ug | ORAL_TABLET | Freq: Every day | ORAL | 3 refills | Status: DC

## 2023-05-23 NOTE — Assessment & Plan Note (Addendum)
 Last vitamin D Lab Results  Component Value Date   VD25OH 16.9 (L) 01/04/2023    Was on  vitamin D 50,000 units once weekly Checking labs

## 2023-05-23 NOTE — Progress Notes (Signed)
 New Patient Office Visit  Subjective:  Patient ID: Paul Campos, male    DOB: 01/22/1991  Age: 33 y.o. MRN: [id number]  CC:  Chief Complaint  Patient presents with   Sickle Cell Anemia    HPI Paul Campos is a 33 y.o. male  has a past medical history of Headache and Sickle cell anemia (HCC).  Patient presented establish care for his chronic medical conditions.  Previous PCP Paul Handler NP  Sickle cell disease.  Currently on folic acid 800 mcg daily, hydroxyurea 1000 mg daily.  For chronic pain takes ibuprofen 800 mg every 8 hours as needed for mild to moderate pain, oxycodone 20 mg every 6 hours as needed for moderate pain.  Stated that oxycodone was last taking 2 days ago.  Patient denies fever, chills, chest pain, shortness of breath, abdominal pain, nausea, vomiting.  His sickle cell pain is currently rated 0.  Will have pain in the legs arms and back when it occurs.  He was referred to hematologist at [facility] for gene therapy by previous PCP but he has not been seen.  Has had multiple visits to the emergency department this year.    Past Medical History:  Diagnosis Date   Headache    Sickle cell anemia (HCC)     Past Surgical History:  Procedure Laterality Date   APPENDECTOMY      Family History  Problem Relation Age of Onset   Lung cancer Mother    Migraines Mother     Social History   Socioeconomic History   Marital status: Single    Spouse name: Not on file   Number of children: 1   Years of education: Not on file   Highest education level: GED or equivalent  Occupational History    Comment: none 09/03/19  Tobacco Use   Smoking status: Never   Smokeless tobacco: Never  Vaping Use   Vaping status: Never Used  Substance and Sexual Activity   Alcohol use: No   Drug use: No   Sexual activity: Yes    Birth control/protection: Condom  Other Topics Concern   Not on file  Social History Narrative      Lives with his aunt    Social Drivers of  Health   Financial Resource Strain: Low Risk  (05/19/2023)   Overall Financial Resource Strain (CARDIA)    Difficulty of Paying Living Expenses: Not hard at all  Food Insecurity: No Food Insecurity (05/19/2023)   Hunger Vital Sign    Worried About Running Out of Food in the Last Year: Never true    Ran Out of Food in the Last Year: Never true  Transportation Needs: No Transportation Needs (05/19/2023)   PRAPARE - Administrator, Civil Service (Medical): No    Lack of Transportation (Non-Medical): No  Physical Activity: Sufficiently Active (05/19/2023)   Exercise Vital Sign    Days of Exercise per Week: 5 days    Minutes of Exercise per Session: 150+ min  Stress: No Stress Concern Present (05/19/2023)   [employer] of Occupational Health - Occupational Stress Questionnaire    Feeling of Stress : Not at all  Social Connections: Socially Isolated (05/19/2023)   Social Connection and Isolation Panel [NHANES]    Frequency of Communication with Friends and Family: More than three times a week    Frequency of Social Gatherings with Friends and Family: Once a week    Attends Religious Services: Never    Active Member  of Clubs or Organizations: No    Attends Banker Meetings: Not on file    Marital Status: Never married  Intimate Partner Violence: Not At Risk (06/26/2021)   Received from [facility], [facility]   Humiliation, Afraid, Rape, and Kick questionnaire    Fear of Current or Ex-Partner: No    Emotionally Abused: No    Physically Abused: No    Sexually Abused: No    ROS Review of Systems  Constitutional:  Negative for appetite change, chills, fatigue and fever.  HENT:  Negative for congestion, postnasal drip, rhinorrhea and sneezing.   Respiratory:  Negative for cough, shortness of breath and wheezing.   Cardiovascular:  Negative for chest pain, palpitations and leg swelling.  Gastrointestinal:  Negative for abdominal pain, constipation, nausea  and vomiting.  Genitourinary:  Negative for difficulty urinating, dysuria, flank pain and frequency.  Musculoskeletal:  Negative for arthralgias, back pain, joint swelling and myalgias.  Skin:  Negative for color change, pallor, rash and wound.  Neurological:  Negative for dizziness, facial asymmetry, weakness, numbness and headaches.  Psychiatric/Behavioral:  Negative for behavioral problems, confusion, self-injury and suicidal ideas.     Objective:   Today's Vitals: BP 98/70   Pulse 73   Temp (!) 97 F (36.1 C)   Ht 6\' 1"  (1.854 m)   Wt 180 lb 9.6 oz (81.9 kg)   SpO2 97%   BMI 23.83 kg/m   Physical Exam Vitals and nursing note reviewed.  Constitutional:      General: He is not in acute distress.    Appearance: Normal appearance. He is not ill-appearing, toxic-appearing or diaphoretic.  HENT:     Mouth/Throat:     Mouth: Mucous membranes are moist.     Pharynx: Oropharynx is clear. No oropharyngeal exudate or posterior oropharyngeal erythema.  Eyes:     General: No scleral icterus.       Right eye: No discharge.        Left eye: No discharge.     Extraocular Movements: Extraocular movements intact.     Conjunctiva/sclera: Conjunctivae normal.  Cardiovascular:     Rate and Rhythm: Normal rate and regular rhythm.     Pulses: Normal pulses.     Heart sounds: Normal heart sounds. No murmur heard.    No friction rub. No gallop.  Pulmonary:     Effort: Pulmonary effort is normal. No respiratory distress.     Breath sounds: Normal breath sounds. No stridor. No wheezing, rhonchi or rales.  Chest:     Chest wall: No tenderness.  Abdominal:     General: There is no distension.     Palpations: Abdomen is soft.     Tenderness: There is no abdominal tenderness. There is no right CVA tenderness, left CVA tenderness or guarding.  Musculoskeletal:        General: No swelling, tenderness, deformity or signs of injury.     Right lower leg: No edema.     Left lower leg: No edema.   Skin:    General: Skin is warm and dry.     Capillary Refill: Capillary refill takes less than 2 seconds.     Coloration: Skin is not jaundiced or pale.     Findings: No bruising, erythema or lesion.  Neurological:     Mental Status: He is alert and oriented to person, place, and time.     Motor: No weakness.     Coordination: Coordination normal.  Gait: Gait normal.  Psychiatric:        Mood and Affect: Mood normal.        Behavior: Behavior normal.        Thought Content: Thought content normal.        Judgment: Judgment normal.     Assessment & Plan:   Problem List Items Addressed This Visit       Cardiovascular and Mediastinum   Migraine without aura and without status migrainosus, not intractable   Currently controlled without medication Was on Ubrelvy and Topamax      Relevant Medications   ibuprofen (ADVIL) 800 MG tablet     Other   Hb-SS disease without crisis (HCC) - Primary   Sickle cell disease - Continue Hydrea 1000 mg daily. Folic acid 800 mcg daily to prevent aplastic bone marrow crises.  We discussed the need for good hydration, monitoring of hydration status, avoidance of heat, cold, stress, and infection triggers.. The patient was reminded of the need to seek medical attention of any symptoms of bleeding, anemia, or infection.    Pulmonary evaluation - Patient denies severe recurrent wheezes, shortness of breath with exercise, or persistent cough. If these symptoms develop, pulmonary function tests with spirometry will be ordered, and if abnormal, plan on referral to Pulmonology for further evaluation.   Eye - High risk of proliferative retinopathy. Annual eye exam with retinal exam recommended to patient.  Referral placed  Immunization status - Patient declined influenza vaccine.  Encouraged to consider getting the vaccine and pneumococcal vaccine  Acute and chronic painful episodes -  Pt states that she primarily utilizes Ibuprofen for mild pain  related to sickle cell anemia, takes oxycodone 20 mg every 6 hours as needed for moderate pain we discussed that pt is to receive her Schedule II prescriptions only from [place]. Pt is also aware that the prescription history is available to [place] online through the [facility] CSRS. We reminded the patient that all patients receiving Schedule II narcotics must be seen for follow within the 3 months in the office.  We reviewed the terms of our pain agreement, including the need to keep medicines in a safe locked location away from children or pets, and the need to report excess sedation or constipation, measures to avoid constipation, and policies related to early refills and stolen prescriptions. According to the [place] [facility] program, we have reviewed details related to analgesia, adverse effects, aberrant behaviors.  Follow-up in 3 months  - Sickle Cell Panel - Ambulatory referral to Hematology / Oncology - Ambulatory referral to Ophthalmology - folic acid (FOLVITE) 800 MCG tablet; Take 1 tablet (800 mcg total) by mouth daily.  Dispense: 90 tablet; Refill: 3 - ibuprofen (ADVIL) 800 MG tablet; Take 1 tablet (800 mg total) by mouth every 8 (eight) hours as needed for moderate pain (pain score 4-6).  Dispense: 30 tablet; Refill: 1 - ToxAssure Flex 15, Ur       Relevant Medications   folic acid (FOLVITE) 800 MCG tablet   ibuprofen (ADVIL) 800 MG tablet   hydroxyurea (HYDREA) 500 MG capsule   Other Relevant Orders   Sickle Cell Panel   Ambulatory referral to Hematology / Oncology   Ambulatory referral to Ophthalmology   ToxAssure Flex 15, Ur   Vitamin D deficiency   Last vitamin D Lab Results  Component Value Date   VD25OH 16.9 (L) 01/04/2023    Was on  vitamin D 50,000 units once weekly Checking labs  Relevant Medications   ergocalciferol (VITAMIN D2) 1.25 MG (50000 UT) capsule    Outpatient Encounter Medications as of 05/23/2023  Medication Sig   Oxycodone HCl 20 MG TABS Take 1  tablet (20 mg total) by mouth every 6 (six) hours as needed.   [DISCONTINUED] folic acid (FOLVITE) 800 MCG tablet Take 1 tablet (800 mcg total) by mouth daily.   [DISCONTINUED] hydroxyurea (HYDREA) 500 MG capsule Take 2 capsules (1,000 mg total) by mouth daily. May take with food to minimize GI side effects.   [DISCONTINUED] ibuprofen (ADVIL) 800 MG tablet Take 1 tablet (800 mg total) by mouth every 8 (eight) hours as needed for moderate pain.   ergocalciferol (VITAMIN D2) 1.25 MG (50000 UT) capsule Take 1 capsule (50,000 Units total) by mouth once a week.   folic acid (FOLVITE) 800 MCG tablet Take 1 tablet (800 mcg total) by mouth daily.   hydroxyurea (HYDREA) 500 MG capsule Take 2 capsules (1,000 mg total) by mouth daily. May take with food to minimize GI side effects.   ibuprofen (ADVIL) 800 MG tablet Take 1 tablet (800 mg total) by mouth every 8 (eight) hours as needed for moderate pain (pain score 4-6).   topiramate (TOPAMAX) 50 MG tablet Take 1 tablet (50 mg total) by mouth 2 (two) times daily. (Patient not taking: Reported on 01/04/2023)   Ubrogepant (UBRELVY) 100 MG TABS Take 1 tablet by mouth as needed. At onset of migraine. Take orally, with or without food. A second dose can be taken at least 2 hours after the initial dose, if needed. The maximum daily dose is 200 mg (2 tablets). No more than 16 tabs per month. (Patient not taking: Reported on 05/23/2023)   [DISCONTINUED] ergocalciferol (VITAMIN D2) 1.25 MG (50000 UT) capsule Take 1 capsule (50,000 Units total) by mouth once a week. (Patient not taking: Reported on 05/23/2023)   No facility-administered encounter medications on file as of 05/23/2023.    Follow-up: No follow-ups on file.   Donell Beers, FNP

## 2023-05-23 NOTE — Assessment & Plan Note (Signed)
 Currently controlled without medication Was on Ubrelvy and Topamax

## 2023-05-23 NOTE — Patient Instructions (Signed)
At a minimum, adult patients with SCD should complete the following primary series (per 2014 SCD Evidence Based Guidelines):  One Dose of PCV13 (Prevnar) followed by PPSV23 (Pneumovax) at least 8 weeks later   One Dose of Haemophilus Influenzae type B (HIB), followed by One Dose of MenACWY (Menveo) one month later  Two Doses of MenB (Bexero) one month apart (this can be given at the same time as HIB and/or MenACWY visits)  We also recommend that patients receive:  Annual Influenza Vaccine  Booster of PPSV23 (Pneumovax) every 5 years  Booster of MenACWY (Menveo) every 5 years  We also suggest for specific patients:  Immunization for Hepatitis B (Twinrix at 0, 1 and 6 month intervals) for patients receiving regular blood transfusions, history of HIV, current or recent drug use or incarceration  Tdap booster every 10 years  Tdap booster for all pregnant women  If no history of primary vaccination series for tetanus, diphtheria, or pertussis:   At least 1 dose Tdap followed by 1 dose Td or Tdap at least 4 weeks later, another dose Td or Tdap 6-12 months later, and a booster every 10 years  1 dose Tdap during each pregnancy      It is important that you exercise regularly at least 30 minutes 5 times a week as tolerated  Think about what you will eat, plan ahead. Choose " clean, green, fresh or frozen" over canned, processed or packaged foods which are more sugary, salty and fatty. 70 to 75% of food eaten should be vegetables and fruit. Three meals at set times with snacks allowed between meals, but they must be fruit or vegetables. Aim to eat over a 12 hour period , example 7 am to 7 pm, and STOP after  your last meal of the day. Drink water,generally about 64 ounces per day, no other drink is as healthy. Fruit juice is best enjoyed in a healthy way, by EATING the fruit.  Thanks for choosing [facility] we consider it a privelige to serve you.

## 2023-05-23 NOTE — Assessment & Plan Note (Signed)
 Sickle cell disease - Continue Hydrea 1000 mg daily. Folic acid 800 mcg daily to prevent aplastic bone marrow crises.  We discussed the need for good hydration, monitoring of hydration status, avoidance of heat, cold, stress, and infection triggers.. The patient was reminded of the need to seek medical attention of any symptoms of bleeding, anemia, or infection.    Pulmonary evaluation - Patient denies severe recurrent wheezes, shortness of breath with exercise, or persistent cough. If these symptoms develop, pulmonary function tests with spirometry will be ordered, and if abnormal, plan on referral to Pulmonology for further evaluation.   Eye - High risk of proliferative retinopathy. Annual eye exam with retinal exam recommended to patient.  Referral placed  Immunization status - Patient declined influenza vaccine.  Encouraged to consider getting the vaccine and pneumococcal vaccine  Acute and chronic painful episodes -  Pt states that she primarily utilizes Ibuprofen for mild pain related to sickle cell anemia, takes oxycodone 20 mg every 6 hours as needed for moderate pain we discussed that pt is to receive her Schedule II prescriptions only from [place]. Pt is also aware that the prescription history is available to [place] online through the [facility] CSRS. We reminded the patient that all patients receiving Schedule II narcotics must be seen for follow within the 3 months in the office.  We reviewed the terms of our pain agreement, including the need to keep medicines in a safe locked location away from children or pets, and the need to report excess sedation or constipation, measures to avoid constipation, and policies related to early refills and stolen prescriptions. According to the [place] [facility] program, we have reviewed details related to analgesia, adverse effects, aberrant behaviors.  Follow-up in 3 months  - Sickle Cell Panel - Ambulatory referral to Hematology / Oncology - Ambulatory  referral to Ophthalmology - folic acid (FOLVITE) 800 MCG tablet; Take 1 tablet (800 mcg total) by mouth daily.  Dispense: 90 tablet; Refill: 3 - ibuprofen (ADVIL) 800 MG tablet; Take 1 tablet (800 mg total) by mouth every 8 (eight) hours as needed for moderate pain (pain score 4-6).  Dispense: 30 tablet; Refill: 1 - ToxAssure Flex 15, Ur

## 2023-05-24 LAB — CMP14+CBC/D/PLT+FER+RETIC+V...
ALT: 12 IU/L (ref 0–44)
AST: 22 IU/L (ref 0–40)
Albumin: 4.1 g/dL (ref 4.1–5.1)
Alkaline Phosphatase: 70 IU/L (ref 44–121)
BUN/Creatinine Ratio: 5 — ABNORMAL LOW (ref 9–20)
BUN: 4 mg/dL — ABNORMAL LOW (ref 6–20)
Basophils Absolute: 0 10*3/uL (ref 0.0–0.2)
Basos: 1 %
Bilirubin Total: 0.5 mg/dL (ref 0.0–1.2)
CO2: 25 mmol/L (ref 20–29)
Calcium: 9 mg/dL (ref 8.7–10.2)
Chloride: 103 mmol/L (ref 96–106)
Creatinine, Ser: 0.86 mg/dL (ref 0.76–1.27)
EOS (ABSOLUTE): 0.5 10*3/uL — ABNORMAL HIGH (ref 0.0–0.4)
Eos: 6 %
Ferritin: 150 ng/mL (ref 30–400)
Globulin, Total: 2.7 g/dL (ref 1.5–4.5)
Glucose: 81 mg/dL (ref 70–99)
Hematocrit: 37.7 % (ref 37.5–51.0)
Hemoglobin: 11.7 g/dL — ABNORMAL LOW (ref 13.0–17.7)
Immature Grans (Abs): 0 10*3/uL (ref 0.0–0.1)
Immature Granulocytes: 0 %
Lymphocytes Absolute: 3.1 10*3/uL (ref 0.7–3.1)
Lymphs: 40 %
MCH: 24.8 pg — ABNORMAL LOW (ref 26.6–33.0)
MCHC: 31 g/dL — ABNORMAL LOW (ref 31.5–35.7)
MCV: 80 fL (ref 79–97)
Monocytes Absolute: 1 10*3/uL — ABNORMAL HIGH (ref 0.1–0.9)
Monocytes: 13 %
Neutrophils Absolute: 3 10*3/uL (ref 1.4–7.0)
Neutrophils: 40 %
Platelets: 226 10*3/uL (ref 150–450)
Potassium: 4.2 mmol/L (ref 3.5–5.2)
RBC: 4.72 x10E6/uL (ref 4.14–5.80)
RDW: 18.5 % — ABNORMAL HIGH (ref 11.6–15.4)
Retic Ct Pct: 3.9 % — ABNORMAL HIGH (ref 0.6–2.6)
Sodium: 141 mmol/L (ref 134–144)
Total Protein: 6.8 g/dL (ref 6.0–8.5)
Vit D, 25-Hydroxy: 16.9 ng/mL — ABNORMAL LOW (ref 30.0–100.0)
WBC: 7.6 10*3/uL (ref 3.4–10.8)
eGFR: 119 mL/min/{1.73_m2} (ref >59)

## 2023-05-26 LAB — OPIATE CLASS, MS, UR RFX
Codeine: NOT DETECTED ng/mg{creat}
Dihydrocodeine: NOT DETECTED ng/mg{creat}
Hydrocodone: NOT DETECTED ng/mg{creat}
Hydromorphone: NOT DETECTED ng/mg{creat}
Morphine: NOT DETECTED ng/mg{creat}
Norcodeine: NOT DETECTED ng/mg{creat}
Norhydrocodone: NOT DETECTED ng/mg{creat}
Normorphine: NOT DETECTED ng/mg{creat}
Opiate Class Confirmation: NEGATIVE

## 2023-05-26 LAB — TOXASSURE FLEX 15, UR
6-ACETYLMORPHINE IA: NEGATIVE ng/mL
AMPHETAMINES IA: NEGATIVE ng/mL
BARBITURATES IA: NEGATIVE ng/mL
COCAINE METABOLITE IA: NEGATIVE ng/mL
Creatinine: 90 mg/dL
ETHYL ALCOHOL Enzymatic: NEGATIVE g/dL
METHADONE IA: NEGATIVE ng/mL
METHADONE MTB IA: NEGATIVE ng/mL
PHENCYCLIDINE IA: NEGATIVE ng/mL
TAPENTADOL, IA: NEGATIVE ng/mL
TRAMADOL IA: NEGATIVE ng/mL

## 2023-05-26 LAB — OXYCODONE CLASS, MS, UR RFX
Noroxycodone: 10077 ng/mg{creat}
Noroxymorphone: 1100 ng/mg{creat}
Oxycodone Class Confirmation: POSITIVE
Oxycodone: 2928 ng/mg{creat}
Oxymorphone: 2310 ng/mg{creat}

## 2023-05-26 LAB — CANNABINOIDS, MS, UR RFX
Cannabinoids Confirmation: POSITIVE
Carboxy-THC: 30 ng/mg{creat}

## 2023-06-06 ENCOUNTER — Telehealth: Payer: Self-pay

## 2023-06-06 ENCOUNTER — Other Ambulatory Visit: Payer: Self-pay | Admitting: Nurse Practitioner

## 2023-06-06 DIAGNOSIS — D571 Sickle-cell disease without crisis: Secondary | ICD-10-CM

## 2023-06-06 DIAGNOSIS — G8929 Other chronic pain: Secondary | ICD-10-CM

## 2023-06-06 MED ORDER — OXYCODONE HCL 20 MG PO TABS: 20.0000 mg | ORAL_TABLET | Freq: Four times a day (QID) | ORAL | 0 refills | Status: DC | PRN

## 2023-06-06 NOTE — Telephone Encounter (Signed)
 Pharmacy Patient Advocate Encounter   Received notification from CoverMyMeds that prior authorization for OXYCODONE is required/requested.   Insurance verification completed.   The patient is insured through [employer] .   Per test claim: PA required; PA submitted to above mentioned insurance via CoverMyMeds Key/confirmation #/EOC [id number] Status is pending

## 2023-06-06 NOTE — Progress Notes (Signed)
 Reviewed PDMP substance reporting system prior to prescribing opiate medications. No inconsistencies noted.   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  Dispense: 60 tablet; Refill: 0  2. Other chronic pain  - Oxycodone HCl 20 MG TABS; Take 1 tablet (20 mg total) by mouth every 6 (six) hours as needed.  Dispense: 60 tablet; Refill: 0

## 2023-06-08 ENCOUNTER — Other Ambulatory Visit: Payer: Self-pay

## 2023-06-08 DIAGNOSIS — D571 Sickle-cell disease without crisis: Secondary | ICD-10-CM

## 2023-06-08 DIAGNOSIS — G8929 Other chronic pain: Secondary | ICD-10-CM

## 2023-06-08 NOTE — Telephone Encounter (Signed)
 Please advise [facility]

## 2023-06-22 ENCOUNTER — Other Ambulatory Visit: Payer: Self-pay | Admitting: Nurse Practitioner

## 2023-06-22 ENCOUNTER — Other Ambulatory Visit: Payer: Self-pay

## 2023-06-22 DIAGNOSIS — D571 Sickle-cell disease without crisis: Secondary | ICD-10-CM

## 2023-06-22 DIAGNOSIS — G8929 Other chronic pain: Secondary | ICD-10-CM

## 2023-06-22 MED ORDER — OXYCODONE HCL 20 MG PO TABS
20.0000 mg | ORAL_TABLET | Freq: Four times a day (QID) | ORAL | 0 refills | Status: DC | PRN
Start: 2023-06-22 — End: 2023-07-05

## 2023-06-22 NOTE — Telephone Encounter (Signed)
 Please advise [facility]

## 2023-06-22 NOTE — Progress Notes (Signed)
 Reviewed PDMP substance reporting system prior to prescribing opiate medications. No inconsistencies noted.   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  Dispense: 60 tablet; Refill: 0  2. Other chronic pain  - Oxycodone HCl 20 MG TABS; Take 1 tablet (20 mg total) by mouth every 6 (six) hours as needed.  Dispense: 60 tablet; Refill: 0

## 2023-07-05 ENCOUNTER — Other Ambulatory Visit: Payer: Self-pay | Admitting: Nurse Practitioner

## 2023-07-05 DIAGNOSIS — D571 Sickle-cell disease without crisis: Secondary | ICD-10-CM

## 2023-07-05 DIAGNOSIS — G8929 Other chronic pain: Secondary | ICD-10-CM

## 2023-07-05 MED ORDER — OXYCODONE HCL 20 MG PO TABS: 20.0000 mg | ORAL_TABLET | Freq: Four times a day (QID) | ORAL | 0 refills | Status: DC | PRN

## 2023-07-05 NOTE — Progress Notes (Unsigned)
 Reviewed PDMP substance reporting system prior to prescribing opiate medications. No inconsistencies noted.   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  Dispense: 60 tablet; Refill: 0  2. Other chronic pain  - Oxycodone HCl 20 MG TABS; Take 1 tablet (20 mg total) by mouth every 6 (six) hours as needed.  Dispense: 60 tablet; Refill: 0

## 2023-07-25 ENCOUNTER — Telehealth: Payer: Self-pay | Admitting: Nurse Practitioner

## 2023-07-25 ENCOUNTER — Other Ambulatory Visit: Payer: Self-pay | Admitting: Nurse Practitioner

## 2023-07-25 DIAGNOSIS — G8929 Other chronic pain: Secondary | ICD-10-CM

## 2023-07-25 DIAGNOSIS — D571 Sickle-cell disease without crisis: Secondary | ICD-10-CM

## 2023-07-25 MED ORDER — OXYCODONE HCL 20 MG PO TABS: 20.0000 mg | ORAL_TABLET | Freq: Four times a day (QID) | ORAL | 0 refills | Status: DC | PRN

## 2023-07-25 NOTE — Telephone Encounter (Signed)
 Caller & Relationship to patient:   MRN #  [id number]   Call Back Number:   Date of Last Office Visit: 06/22/2023     Date of Next Office Visit: 08/08/2023    Medication(s) to be Refilled: Oxycodone   Preferred Pharmacy:   ** Please notify patient to allow 48-72 hours to process** **Let patient know to contact pharmacy at the end of the day to make sure medication is ready. ** **If patient has not been seen in a year or longer, book an appointment **Advise to use MyChart for refill requests OR to contact their pharmacy

## 2023-07-25 NOTE — Progress Notes (Signed)
 Reviewed PDMP substance reporting system prior to prescribing opiate medications. No inconsistencies noted.   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  Dispense: 60 tablet; Refill: 0  2. Other chronic pain  - Oxycodone HCl 20 MG TABS; Take 1 tablet (20 mg total) by mouth every 6 (six) hours as needed.  Dispense: 60 tablet; Refill: 0

## 2023-08-08 ENCOUNTER — Ambulatory Visit (INDEPENDENT_AMBULATORY_CARE_PROVIDER_SITE_OTHER): Payer: Self-pay | Admitting: Nurse Practitioner

## 2023-08-08 ENCOUNTER — Encounter: Payer: Self-pay | Admitting: Nurse Practitioner

## 2023-08-08 VITALS — BP 108/69 | HR 71 | Temp 97.4°F | Wt 180.0 lb

## 2023-08-08 DIAGNOSIS — F1290 Cannabis use, unspecified, uncomplicated: Secondary | ICD-10-CM | POA: Diagnosis not present

## 2023-08-08 DIAGNOSIS — D571 Sickle-cell disease without crisis: Secondary | ICD-10-CM | POA: Diagnosis not present

## 2023-08-08 DIAGNOSIS — E559 Vitamin D deficiency, unspecified: Secondary | ICD-10-CM

## 2023-08-08 DIAGNOSIS — G43009 Migraine without aura, not intractable, without status migrainosus: Secondary | ICD-10-CM | POA: Diagnosis not present

## 2023-08-08 HISTORY — DX: Cannabis use, unspecified, uncomplicated: F12.90

## 2023-08-08 MED ORDER — HYDROXYUREA 500 MG PO CAPS: 1000.0000 mg | ORAL_CAPSULE | Freq: Every day | ORAL | 2 refills | Status: DC

## 2023-08-08 MED ORDER — IBUPROFEN 800 MG PO TABS: 800.0000 mg | ORAL_TABLET | Freq: Three times a day (TID) | ORAL | 3 refills | Status: DC | PRN

## 2023-08-08 MED ORDER — FOLIC ACID 800 MCG PO TABS: 800.0000 ug | ORAL_TABLET | Freq: Every day | ORAL | 3 refills | Status: DC

## 2023-08-08 MED ORDER — ERGOCALCIFEROL 1.25 MG (50000 UT) PO CAPS: 50000.0000 [IU] | ORAL_CAPSULE | ORAL | 3 refills | Status: DC

## 2023-08-08 NOTE — Assessment & Plan Note (Signed)
 Currently not taking medication Encouraged to take vitamin D  50,000 units once weekly Last vitamin D  Lab Results  Component Value Date   VD25OH 16.9 (L) 05/23/2023

## 2023-08-08 NOTE — Assessment & Plan Note (Signed)
 Cessation encouraged

## 2023-08-08 NOTE — Assessment & Plan Note (Signed)
 Currently controlled without medication Will discontinue medication and restart if needed

## 2023-08-08 NOTE — Progress Notes (Signed)
 Established Patient Office Visit  Subjective:  Patient ID: Paul Campos, male    DOB: 01-08-1991  Age: 33 y.o. MRN: [id number]  CC:  Chief Complaint  Patient presents with   Sickle Cell Anemia    Follow up     Paul Campos is a 33 y.o. male  has a past medical history of Headache and Sickle cell anemia (HCC).  Patient presents for follow-up for his chronic medical conditions  Sickle cell disease.  Currently on hydroxyurea  1000 mg daily, folic acid  800 mcg daily.  For chronic pain takes ibuprofen  800 mg 3 times daily as needed for mild pain, oxycodone  20 mg every 6 hours as needed moderate pain.  Oxycodone  was last taken almost a week ago.  He is pain is currently rated 2/10 mainly in his legs and arms.  Currently denies fever, chills, chest pain, shortness of breath, abdominal pain, nausea, vomiting.   Has had  1 ED visit since his last visit     Past Medical History:  Diagnosis Date   Headache    Sickle cell anemia (HCC)     Past Surgical History:  Procedure Laterality Date   APPENDECTOMY      Family History  Problem Relation Age of Onset   Lung cancer Mother    Migraines Mother     Social History   Socioeconomic History   Marital status: Single    Spouse name: Not on file   Number of children: 1   Years of education: Not on file   Highest education level: GED or equivalent  Occupational History    Comment: none 09/03/19  Tobacco Use   Smoking status: Never   Smokeless tobacco: Never  Vaping Use   Vaping status: Never Used  Substance and Sexual Activity   Alcohol use: No   Drug use: No   Sexual activity: Yes    Birth control/protection: Condom  Other Topics Concern   Not on file  Social History Narrative      Lives with his aunt    Social Drivers of Health   Financial Resource Strain: Low Risk  (08/06/2023)   Overall Financial Resource Strain (CARDIA)    Difficulty of Paying Living Expenses: Not hard at all  Food Insecurity: No Food  Insecurity (08/06/2023)   Hunger Vital Sign    Worried About Running Out of Food in the Last Year: Never true    Ran Out of Food in the Last Year: Never true  Transportation Needs: No Transportation Needs (08/06/2023)   PRAPARE - Administrator, Civil Service (Medical): No    Lack of Transportation (Non-Medical): No  Physical Activity: Insufficiently Active (08/06/2023)   Exercise Vital Sign    Days of Exercise per Week: 4 days    Minutes of Exercise per Session: 30 min  Stress: No Stress Concern Present (08/06/2023)   [employer] of Occupational Health - Occupational Stress Questionnaire    Feeling of Stress: Not at all  Social Connections: Moderately Isolated (08/06/2023)   Social Connection and Isolation Panel    Frequency of Communication with Friends and Family: Three times a week    Frequency of Social Gatherings with Friends and Family: Once a week    Attends Religious Services: 1 to 4 times per year    Active Member of [employer] or Organizations: No    Attends Engineer, structural: Not on file    Marital Status: Never married  Intimate Partner Violence: Not At Risk (  06/26/2021)   [facility]   Humiliation, Afraid, Rape, and Kick questionnaire    Within the last year, have you been afraid of your partner or ex-partner?: No    Within the last year, have you been humiliated or emotionally abused in other ways by your partner or ex-partner?: No    Within the last year, have you been kicked, hit, slapped, or otherwise physically hurt by your partner or ex-partner?: No    Within the last year, have you been raped or forced to have any kind of sexual activity by your partner or ex-partner?: No    Outpatient Medications Prior to Visit  Medication Sig Dispense Refill   Oxycodone  HCl 20 MG TABS Take 1 tablet (20 mg total) by mouth every 6 (six) hours as needed. 60 tablet 0   folic acid  (FOLVITE ) 800 MCG tablet Take 1 tablet (800 mcg total) by mouth  daily. 90 tablet 3   hydroxyurea  (HYDREA ) 500 MG capsule Take 2 capsules (1,000 mg total) by mouth daily. May take with food to minimize GI side effects. 180 capsule 2   ibuprofen  (ADVIL ) 800 MG tablet Take 1 tablet (800 mg total) by mouth every 8 (eight) hours as needed for moderate pain (pain score 4-6). 30 tablet 1   ergocalciferol  (VITAMIN D2) 1.25 MG (50000 UT) capsule Take 1 capsule (50,000 Units total) by mouth once a week. (Patient not taking: Reported on 08/08/2023) 12 capsule 3   topiramate  (TOPAMAX ) 50 MG tablet Take 1 tablet (50 mg total) by mouth 2 (two) times daily. (Patient not taking: Reported on 08/08/2023) 180 tablet 3   Ubrogepant  (UBRELVY ) 100 MG TABS Take 1 tablet by mouth as needed. At onset of migraine. Take orally, with or without food. A second dose can be taken at least 2 hours after the initial dose, if needed. The maximum daily dose is 200 mg (2 tablets). No more than 16 tabs per month. (Patient not taking: Reported on 08/08/2023) 16 tablet 3   No facility-administered medications prior to visit.    Allergies  Allergen Reactions   Dilaudid  [Hydromorphone  Hcl] Hives   Hydromorphone  Itching    Morphine  okay.   Cefotaxime Itching and Rash    Hives    ROS Review of Systems  Constitutional:  Negative for appetite change, chills, fatigue and fever.  HENT:  Negative for congestion, postnasal drip, rhinorrhea and sneezing.   Respiratory:  Negative for cough, shortness of breath and wheezing.   Cardiovascular:  Negative for chest pain, palpitations and leg swelling.  Gastrointestinal:  Negative for abdominal pain, constipation, nausea and vomiting.  Genitourinary:  Negative for difficulty urinating, dysuria, flank pain and frequency.  Musculoskeletal:  Negative for arthralgias, back pain, joint swelling and myalgias.  Skin:  Negative for color change, pallor, rash and wound.  Neurological:  Negative for dizziness, facial asymmetry, weakness, numbness and headaches.   Psychiatric/Behavioral:  Negative for behavioral problems, confusion, self-injury and suicidal ideas.       Objective:    Physical Exam Vitals and nursing note reviewed.  Constitutional:      General: He is not in acute distress.    Appearance: Normal appearance. He is not ill-appearing, toxic-appearing or diaphoretic.   Eyes:     General: No scleral icterus.       Right eye: No discharge.        Left eye: No discharge.     Extraocular Movements: Extraocular movements intact.     Conjunctiva/sclera: Conjunctivae normal.  Cardiovascular:     Rate and Rhythm: Normal rate and regular rhythm.     Pulses: Normal pulses.     Heart sounds: Normal heart sounds. No murmur heard.    No friction rub. No gallop.  Pulmonary:     Effort: Pulmonary effort is normal. No respiratory distress.     Breath sounds: Normal breath sounds. No stridor. No wheezing, rhonchi or rales.  Chest:     Chest wall: No tenderness.  Abdominal:     General: There is no distension.     Palpations: Abdomen is soft.     Tenderness: There is no abdominal tenderness. There is no right CVA tenderness, left CVA tenderness or guarding.   Musculoskeletal:        General: No swelling, tenderness, deformity or signs of injury.     Right lower leg: No edema.     Left lower leg: No edema.   Skin:    General: Skin is warm and dry.     Capillary Refill: Capillary refill takes less than 2 seconds.     Coloration: Skin is not jaundiced or pale.     Findings: No bruising, erythema or lesion.   Neurological:     Mental Status: He is alert and oriented to person, place, and time.     Motor: No weakness.     Gait: Gait normal.   Psychiatric:        Mood and Affect: Mood normal.        Behavior: Behavior normal.        Thought Content: Thought content normal.        Judgment: Judgment normal.     BP 108/69   Pulse 71   Temp (!) 97.4 F (36.3 C)   Wt 180 lb (81.6 kg)   SpO2 100%   BMI 23.75 kg/m  Wt  Readings from Last 3 Encounters:  08/08/23 180 lb (81.6 kg)  05/23/23 180 lb 9.6 oz (81.9 kg)  01/04/23 181 lb (82.1 kg)    No results found for: TSH Lab Results  Component Value Date   WBC 7.6 05/23/2023   HGB 11.7 (L) 05/23/2023   HCT 37.7 05/23/2023   MCV 80 05/23/2023   PLT 226 05/23/2023   Lab Results  Component Value Date   NA 141 05/23/2023   K 4.2 05/23/2023   CO2 25 05/23/2023   GLUCOSE 81 05/23/2023   BUN 4 (L) 05/23/2023   CREATININE 0.86 05/23/2023   BILITOT 0.5 05/23/2023   ALKPHOS 70 05/23/2023   AST 22 05/23/2023   ALT 12 05/23/2023   PROT 6.8 05/23/2023   ALBUMIN 4.1 05/23/2023   CALCIUM 9.0 05/23/2023   ANIONGAP 9 01/23/2022   EGFR 119 05/23/2023   No results found for: CHOL No results found for: HDL No results found for: LDLCALC No results found for: TRIG No results found for: CHOLHDL No results found for: ZOXW9U    Assessment & Plan:   Problem List Items Addressed This Visit       Cardiovascular and Mediastinum   Migraine without aura and without status migrainosus, not intractable   Currently controlled without medication Will discontinue medication and restart if needed      Relevant Medications   ibuprofen  (ADVIL ) 800 MG tablet     Other   Hb-SS disease without crisis (HCC)   Sickle cell disease - Continue Hydrea  1000 mg daily. Folic acid  800 mcg daily to prevent aplastic bone marrow crises.  Medications refilled We  discussed the need for good hydration, monitoring of hydration status, avoidance of heat, cold, stress, and infection triggers.. The patient was reminded of the need to seek medical attention of any symptoms of bleeding, anemia, or infection.     Pulmonary evaluation - Patient denies severe recurrent wheezes, shortness of breath with exercise, or persistent cough. If these symptoms develop, pulmonary function tests with spirometry will be ordered, and if abnormal, plan on referral to Pulmonology for further  evaluation.   Eye - High risk of proliferative retinopathy. Annual eye exam with retinal exam recommended to patient.  Encouraged to call the ophthalmology office to schedule an appointment   Immunization status -encouraged to get pneumococcal vaccine at the pharmacy  Acute and chronic painful episodes -  Pt states that he primarily utilizes Ibuprofen  for mild pain related to sickle cell anemia, takes oxycodone  20 mg every 6 hours as needed for moderate pain we discussed that pt is to receive her Schedule II prescriptions only from us . Pt is also aware that the prescription history is available to us  online through the [facility] CSRS. We reminded the patient that all patients receiving Schedule II narcotics must be seen for follow within the 3 months in the office.  We reviewed the terms of our pain agreement, including the need to keep medicines in a safe locked location away from children or pets, and the need to report excess sedation or constipation, measures to avoid constipation, we have reviewed details related to analgesia, adverse effects, aberrant behaviors.  He is still interested in gene therapy.  Encouraged to call [facility] hematology  - ToxAssure Flex 15, Ur - Sickle Cell Panel - hydroxyurea  (HYDREA ) 500 MG capsule; Take 2 capsules (1,000 mg total) by mouth daily. May take with food to minimize GI side effects.  Dispense: 180 capsule; Refill: 2 - ibuprofen  (ADVIL ) 800 MG tablet; Take 1 tablet (800 mg total) by mouth every 8 (eight) hours as needed for moderate pain (pain score 4-6).  Dispense: 30 tablet; Refill: 3        Relevant Medications   hydroxyurea  (HYDREA ) 500 MG capsule   ibuprofen  (ADVIL ) 800 MG tablet   folic acid  (FOLVITE ) 800 MCG tablet   Other Relevant Orders   ToxAssure Flex 15, Ur   Sickle Cell Panel   Vitamin D  deficiency - Primary   Currently not taking medication Encouraged to take vitamin D  50,000 units once weekly Last vitamin D  Lab Results  Component Value  Date   VD25OH 16.9 (L) 05/23/2023         Relevant Medications   ergocalciferol  (VITAMIN D2) 1.25 MG (50000 UT) capsule   Marijuana use   Cessation encouraged       Meds ordered this encounter  Medications   ergocalciferol  (VITAMIN D2) 1.25 MG (50000 UT) capsule    Sig: Take 1 capsule (50,000 Units total) by mouth once a week.    Dispense:  12 capsule    Refill:  3   hydroxyurea  (HYDREA ) 500 MG capsule    Sig: Take 2 capsules (1,000 mg total) by mouth daily. May take with food to minimize GI side effects.    Dispense:  180 capsule    Refill:  2   ibuprofen  (ADVIL ) 800 MG tablet    Sig: Take 1 tablet (800 mg total) by mouth every 8 (eight) hours as needed for moderate pain (pain score 4-6).    Dispense:  30 tablet    Refill:  3   folic acid  (  FOLVITE ) 800 MCG tablet    Sig: Take 1 tablet (800 mcg total) by mouth daily.    Dispense:  90 tablet    Refill:  3    Follow-up: Return in about 3 months (around 11/08/2023) for CPE.    Rainier Feuerborn R Tyree Fluharty, FNP

## 2023-08-08 NOTE — Assessment & Plan Note (Signed)
 Sickle cell disease - Continue Hydrea  1000 mg daily. Folic acid  800 mcg daily to prevent aplastic bone marrow crises.  Medications refilled We discussed the need for good hydration, monitoring of hydration status, avoidance of heat, cold, stress, and infection triggers.. The patient was reminded of the need to seek medical attention of any symptoms of bleeding, anemia, or infection.     Pulmonary evaluation - Patient denies severe recurrent wheezes, shortness of breath with exercise, or persistent cough. If these symptoms develop, pulmonary function tests with spirometry will be ordered, and if abnormal, plan on referral to Pulmonology for further evaluation.   Eye - High risk of proliferative retinopathy. Annual eye exam with retinal exam recommended to patient.  Encouraged to call the ophthalmology office to schedule an appointment   Immunization status -encouraged to get pneumococcal vaccine at the pharmacy  Acute and chronic painful episodes -  Pt states that he primarily utilizes Ibuprofen  for mild pain related to sickle cell anemia, takes oxycodone  20 mg every 6 hours as needed for moderate pain we discussed that pt is to receive her Schedule II prescriptions only from us . Pt is also aware that the prescription history is available to us  online through the [facility] CSRS. We reminded the patient that all patients receiving Schedule II narcotics must be seen for follow within the 3 months in the office.  We reviewed the terms of our pain agreement, including the need to keep medicines in a safe locked location away from children or pets, and the need to report excess sedation or constipation, measures to avoid constipation, we have reviewed details related to analgesia, adverse effects, aberrant behaviors.  He is still interested in gene therapy.  Encouraged to call [facility]  - ToxAssure Flex 15, Ur - Sickle Cell Panel - hydroxyurea  (HYDREA ) 500 MG capsule; Take 2 capsules (1,000 mg  total) by mouth daily. May take with food to minimize GI side effects.  Dispense: 180 capsule; Refill: 2 - ibuprofen  (ADVIL ) 800 MG tablet; Take 1 tablet (800 mg total) by mouth every 8 (eight) hours as needed for moderate pain (pain score 4-6).  Dispense: 30 tablet; Refill: 3

## 2023-08-08 NOTE — Patient Instructions (Addendum)
 [facility] - [facility] [address] [place] [address]  P:  [phone] F:  [phone]   [facility] [address] [place] [address]  P:  [phone] F:  [phone] .  It is important that you exercise regularly at least 30 minutes 5 times a week as tolerated  Think about what you will eat, plan ahead. Choose  clean, green, fresh or frozen over canned, processed or packaged foods which are more sugary, salty and fatty. 70 to 75% of food eaten should be vegetables and fruit. Three meals at set times with snacks allowed between meals, but they must be fruit or vegetables. Aim to eat over a 12 hour period , example 7 am to 7 pm, and STOP after  your last meal of the day. Drink water,generally about 64 ounces per day, no other drink is as healthy. Fruit juice is best enjoyed in a healthy way, by EATING the fruit.  Thanks for choosing [facility] we consider it a privelige to serve you.

## 2023-08-09 ENCOUNTER — Other Ambulatory Visit: Payer: Self-pay | Admitting: Nurse Practitioner

## 2023-08-09 ENCOUNTER — Other Ambulatory Visit: Payer: Self-pay

## 2023-08-09 DIAGNOSIS — G8929 Other chronic pain: Secondary | ICD-10-CM

## 2023-08-09 DIAGNOSIS — D571 Sickle-cell disease without crisis: Secondary | ICD-10-CM

## 2023-08-09 LAB — CMP14+CBC/D/PLT+FER+RETIC+V...

## 2023-08-10 LAB — CMP14+CBC/D/PLT+FER+RETIC+V...
ALT: 12 IU/L (ref 0–44)
AST: 22 IU/L (ref 0–40)
Albumin: 4.6 g/dL (ref 4.1–5.1)
Alkaline Phosphatase: 75 IU/L (ref 44–121)
BUN/Creatinine Ratio: 9 (ref 9–20)
BUN: 7 mg/dL (ref 6–20)
Basos: 1 %
Bilirubin Total: 0.7 mg/dL (ref 0.0–1.2)
CO2: 22 mmol/L (ref 20–29)
Calcium: 9.6 mg/dL (ref 8.7–10.2)
Chloride: 100 mmol/L (ref 96–106)
Creatinine, Ser: 0.79 mg/dL (ref 0.76–1.27)
EOS (ABSOLUTE): 0 10*3/uL (ref 0.0–0.2)
Eos: 8 %
Ferritin: 193 ng/mL (ref 30–400)
Globulin, Total: 3.6 g/dL (ref 1.5–4.5)
Glucose: 71 mg/dL (ref 70–99)
Hematocrit: 40.8 % (ref 37.5–51.0)
Hemoglobin: 12.4 g/dL — ABNORMAL LOW (ref 13.0–17.7)
Immature Granulocytes: 0 %
Immature Granulocytes: 0 10*3/uL (ref 0.0–0.1)
Lymphs: 26 %
MCH: 24.5 pg — ABNORMAL LOW (ref 26.6–33.0)
MCHC: 30.4 g/dL — ABNORMAL LOW (ref 31.5–35.7)
MCV: 81 fL (ref 79–97)
Monocytes Absolute: 0.5 10*3/uL — ABNORMAL HIGH (ref 0.0–0.4)
Monocytes Absolute: 1.3 10*3/uL — ABNORMAL HIGH (ref 0.1–0.9)
Monocytes: 20 %
Neutrophils Absolute: 1.7 10*3/uL (ref 0.7–3.1)
Neutrophils Absolute: 2.9 10*3/uL (ref 1.4–7.0)
Neutrophils: 45 %
Platelets: 270 10*3/uL (ref 150–450)
Potassium: 4.5 mmol/L (ref 3.5–5.2)
RBC: 5.06 x10E6/uL (ref 4.14–5.80)
RDW: 18.4 % — ABNORMAL HIGH (ref 11.6–15.4)
Retic Ct Pct: 3.2 % — ABNORMAL HIGH (ref 0.6–2.6)
Sodium: 137 mmol/L (ref 134–144)
Total Protein: 8.2 g/dL (ref 6.0–8.5)
Vit D, 25-Hydroxy: 16.6 ng/mL — ABNORMAL LOW (ref 30.0–100.0)
WBC: 6.3 10*3/uL (ref 3.4–10.8)
eGFR: 122 mL/min/{1.73_m2} (ref >59)

## 2023-08-11 LAB — TOXASSURE FLEX 15, UR
6-ACETYLMORPHINE IA: NEGATIVE ng/mL
7-aminoclonazepam: NOT DETECTED ng/mg{creat}
AMPHETAMINES IA: NEGATIVE ng/mL
Alpha-hydroxyalprazolam: NOT DETECTED ng/mg{creat}
Alpha-hydroxymidazolam: NOT DETECTED ng/mg{creat}
Alpha-hydroxytriazolam: NOT DETECTED ng/mg{creat}
Alprazolam: NOT DETECTED ng/mg{creat}
BARBITURATES IA: NEGATIVE ng/mL
BUPRENORPHINE: NEGATIVE
Benzodiazepines: NEGATIVE
Buprenorphine: NOT DETECTED ng/mg{creat}
CANNABINOIDS IA: NEGATIVE ng/mL
COCAINE METABOLITE IA: NEGATIVE ng/mL
Clonazepam: NOT DETECTED ng/mg{creat}
Creatinine: 80 mg/dL (ref >=20)
Desalkylflurazepam: NOT DETECTED ng/mg{creat}
Desmethyldiazepam: NOT DETECTED ng/mg{creat}
Desmethylflunitrazepam: NOT DETECTED ng/mg{creat}
Diazepam: NOT DETECTED ng/mg{creat}
ETHYL ALCOHOL Enzymatic: NEGATIVE g/dL
FENTANYL: NEGATIVE
Fentanyl: NOT DETECTED ng/mg{creat}
Flunitrazepam: NOT DETECTED ng/mg{creat}
Lorazepam: NOT DETECTED ng/mg{creat}
METHADONE IA: NEGATIVE ng/mL
METHADONE MTB IA: NEGATIVE ng/mL
Midazolam: NOT DETECTED ng/mg{creat}
Norbuprenorphine: NOT DETECTED ng/mg{creat}
Norfentanyl: NOT DETECTED ng/mg{creat}
OPIATE CLASS IA: NEGATIVE ng/mL
OXYCODONE CLASS IA: NEGATIVE ng/mL
Oxazepam: NOT DETECTED ng/mg{creat}
PHENCYCLIDINE IA: NEGATIVE ng/mL
TAPENTADOL, IA: NEGATIVE ng/mL
TRAMADOL IA: NEGATIVE ng/mL
Temazepam: NOT DETECTED ng/mg{creat}

## 2023-08-15 ENCOUNTER — Other Ambulatory Visit: Payer: Self-pay

## 2023-08-15 ENCOUNTER — Other Ambulatory Visit: Payer: Self-pay | Admitting: Nurse Practitioner

## 2023-08-15 DIAGNOSIS — D571 Sickle-cell disease without crisis: Secondary | ICD-10-CM

## 2023-08-15 DIAGNOSIS — G8929 Other chronic pain: Secondary | ICD-10-CM

## 2023-08-15 MED ORDER — OXYCODONE HCL 20 MG PO TABS: 20.0000 mg | ORAL_TABLET | Freq: Four times a day (QID) | ORAL | 0 refills | Status: DC | PRN

## 2023-08-15 MED ORDER — HYDROXYUREA 500 MG PO CAPS: 1000.0000 mg | ORAL_CAPSULE | Freq: Every day | ORAL | 2 refills | Status: DC

## 2023-08-15 MED ORDER — FOLIC ACID 800 MCG PO TABS: 800.0000 ug | ORAL_TABLET | Freq: Every day | ORAL | 3 refills | Status: DC

## 2023-08-15 NOTE — Telephone Encounter (Signed)
 Please advise [facility]

## 2023-08-15 NOTE — Progress Notes (Signed)
 Reviewed PDMP substance reporting system prior to prescribing opiate medications. No inconsistencies noted.   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  Dispense: 40 tablet; Refill: 0   2. Other chronic pain  - Oxycodone  HCl 20 MG TABS; Take 1 tablet (20 mg total) by mouth every 6 (six) hours as needed.  Dispense: 40 tablet; Refill: 0

## 2023-08-16 ENCOUNTER — Telehealth: Payer: Self-pay

## 2023-08-16 NOTE — Telephone Encounter (Signed)
 Error

## 2023-08-31 NOTE — Progress Notes (Signed)
 Case Manager Discharge Summary / Closing Note  Expected Discharge Date & Time: 08/31/2023 at   Discharge Plan:  The patient has been involved in the development of this plan and is in agreement.    Post-Acute Services Coordinated: No resources indicated at this time       Transportation: Arrangements: patient arranged Service Arranged: private vehicle  Final ADT:  Final ADT Discharge Disposition: Home Based  Home Based: Home or Self Care                 Second IMM Received: Not indicated (08/31/23 0920)  Final Summary: Pt will dc home.   Paul Campos

## 2023-08-31 NOTE — Discharge Summary (Signed)
 [facility] Discharge Summary  Admit Date: 08/29/2023 Discharge Date: 08/31/2023  Admitting Physician: Brian Stage, MD Discharge Physician: Marylene Andrea Doffing, MD  Primary Care Provider: Tilford Bertram HERO, NP, Phone [phone]  Discharge Destination: Home  Admission Diagnoses:  Sickle cell pain crisis (CMS/HHS-HCC) [D57.00] Sickle cell crisis (CMS/HHS-HCC) [D57.00]  Discharge Diagnoses:  Principal Problem:   Sickle cell pain crisis (CMS/HHS-HCC) Resolved Problems:   * No resolved hospital problems. *  Primary Diagnosis: Admitted for sickle cell crisis  Changes Made (with rationale):  No new med changes  To-Do List (incidental findings, follow-up studies, etc.): none  Anticipatory Guidance for Outpatient Care:  Referral placed for hematology to establish care with [facility]    Results Pending at Discharge:  None Please see phone numbers at end of this summary for lab contact information.   Follow-up/Care Transition Plan: Sched. appts: No future appointments.  Follow-up info: No follow-up provider specified.     Allergies/Intolerances:  Allergies  Allergen Reactions  . Cefotaxime Hives, Itching, Other (See Comments) and Rash    Hives  Hives, tolerated rocephin previously  Tolerated rocephin previously  . Hydromorphone  Hives and Itching    Morphine  okay.  Tolerates morphine      New Adverse Drug Events: none  Medications:     Current Discharge Medication List     START taking these medications      Instructions  acetaminophen  325 MG tablet Refills: 0 Stop taking on: September 10, 2023  Commonly known as: TYLENOL  Take 3 tablets (975 mg total) by mouth every 6 (six) hours as needed for Pain or Fever for up to 10 days Last time this was given: 975 mg on August 31, 2023  6:13 AM       CONTINUE taking these medications      Instructions  folic acid  400 MCG tablet Refills: 0  Commonly known as: FOLVITE  Take 800 mcg  by mouth once daily Last time this was given: 800 mcg on August 31, 2023  8:05 AM   hydroxyurea  500 mg capsule Refills: 0  Commonly known as: HYDREA  Take 1 capsule (500 mg total) by mouth 2 (two) times daily with meals Last time this was given: 500 mg on August 31, 2023  8:06 AM   ibuprofen  800 MG tablet Refills: 0  Commonly known as: MOTRIN  Take 800 mg by mouth every 8 (eight) hours as needed for Pain   oxyCODONE  20 mg immediate release tablet Quantity: 40 tablet Refills: 0  Commonly known as: OxyIR Take 1 tablet (20 mg total) by mouth every 6 (six) hours as needed Last time this was given: 20 mg on August 31, 2023  6:12 AM         Brief History of Present Illness: (per H&P) Paul Campos is a 33 y.o. male with hx of sickle cell anemia followed previously at [facility] presents today with 4-day history of back pain and left leg pain.  He has been taking his medications including hydroxyurea  and folic acid  daily.  He reports taking ibuprofen  and oxycodone  but pain was not getting controlled prompting him to come to the ED.  He denies any recent illnesses, current fevers, chills, chest pain, shortness of breath, nausea, vomiting, diarrhea.  He denies any sick contacts.  In the ED workup included retake percentage of 4.78, hemoglobin 11.5.  Lactate was 1.4.  LFTs ok. He was given Toradol  and morphine  4 mg IV x 3 and still reported pain of 8 out of  10.  _____________________   Hospital Course by Problem:  Acute sickle cell pain crisis Sickle cell anemia Treated with IV fluids and IV morphine  as below plus scheduled tylenol  and toradol . Transitioned to PO oxycodone  and doing well.  Continued home hydoxyurea. Placed referral to heme/sickle cell clinic here to establish care   SICKLE CELL INPATIENT SUMMARY - Medication: Morphine  PCA settings: 3mg  every with 15mg  one hour lockout. No continuous dosing - Transfusions: none - Complications: none - Discharge meds: IV antibiotics: none;  opioids prescribed: refilled oxycodone  while getting established with hematology here     Surgeries and Procedures Performed:  none  _____________________  Discharge Exam:  BP 125/85 (BP Location: Left upper arm, Patient Position: Sitting)   Pulse 75   Temp 37.1 C (98.8 F) (Oral)   Resp 16   Ht 182.9 cm (6')   Wt 80 kg (176 lb 6.4 oz)   SpO2 97%   BMI 23.92 kg/m    General: alert, cooperative, in NAD Eyes: conjunctiva clear, anicteric sclera HENT: oropharynx clear, moist mucous membranes Neck: no JVD and supple, symmetrical, trachea midline CV: regular rate and rhythm, without murmurs, rubs or gallops Resp: clear to auscultation, good air exchange Abd: soft, nontender, nondistended, normoactive bowel sounds  Ext: no lower extremity edema Skin: no rashes or lesions Psych: oriented to time, place and person, mood and affect are appropriate Neuro: Grossly normal  Pertinent Lab Testing: Recent Labs  Lab 08/29/23 0646 08/30/23 0410 08/31/23 0405  NA 138 140 141  K 3.9  --  3.6  CL 108 108 108  CO2 22 25 24   BUN 13 11 7   CREATININE 0.8 0.8 0.8  GLUCOSE 89 115 111  CALCIUM 8.8 8.5* 8.6*   Recent Labs  Lab 08/29/23 0051  AST 26  ALT 19  ALKPHOS 49  TBILI 1.2    Recent Labs  Lab 08/29/23 0646 08/30/23 0410 08/31/23 0405  WBC 9.6 9.8 9.8  HGB 10.0* 9.5* 8.8*  HCT 28.7* 26.9* 25.0*  PLT 220 186 215   No results for input(s): APTT, INR in the last 168 hours.   Other Pertinent Labs:  Lactate Dehydrogenase (LDH)  Date Value Ref Range Status  08/30/2023 240 (H) 100 - 200 U/L Final         Reticulocyte %  Date Value Ref Range Status  08/30/2023 4.24 (H) 0.70 - 2.10 % Final  08/29/2023 4.78 (H) 0.70 - 2.10 % Final         Reticulocyte Count /L  Date Value Ref Range Status  08/30/2023 162.8 (H) 28.0 - 134.0 x10^9/L Final  08/29/2023 220.8 (H) 28.0 - 134.0 x10^9/L Final     Pertinent Imaging:  X-ray chest PA and lateral  08/29/2023 FINDINGS/IMPRESSION: Heart/mediastinum: Normal size. Lungs: No focal consolidation. Pleura: No effusions. No pneumothorax. Bones: No acute osseous abnormality.  H shaped vertebrae. Electronically Signed by:  Joesph JONETTA Luis, MD, [facility] Electronically Signed on:  08/29/2023 7:55 AM  _____________________  Code Status: Full Code Goals of care were addressed during this admission: confirmed.   Status on Discharge:  Current activity: Walks frequently (08/30/23 2059) Current mobility: No limitation (08/30/23 2059)  Activity Recommendation: activity as tolerated  Other Discharge Instructions: Services setup at discharge: None Tubes/lines at discharge: None  Diet: Diet regular  Wound Care Order Instructions     None       _____________________  Time spent on discharge process: 35 minutes    EMILY LYNN SCHWARTZ, PA [facility]  08/31/2023   Hospital Contact Information:  [facility]) [facility]) [facility])  Pending tests:  Laboratory: [phone] Microbiology: [phone] Pathology: [phone] Radiology: [phone]  General questions: [phone] Pending tests: Laboratory: [phone] Microbiology: [phone] Pathology: [phone] Radiology: [phone]  General questions:  [phone] Pending tests:  Laboratory: [phone] Microbiology: [phone] Pathology: [phone] Radiology: [phone]  General questions:  [phone]    Effective July 24, 2022, [facility], [facility], and/or [facility] refer to the [facility] campus of [facility].

## 2023-09-01 ENCOUNTER — Telehealth: Payer: Self-pay

## 2023-09-01 NOTE — Patient Instructions (Signed)
 Visit Information  Thank you for taking time to visit with me today. Please don't hesitate to contact me if I can be of assistance to you   Patient instructions: Stay hydrated - drink plenty of water Avoid extreme temperatures - heat/ cold ( avoid sudden changes in temperature such as jumping into cold water. Eat a balanced diet rich in fruits, vegetables, whole grains, and lean proteins Take medications as prescribed Follow up with your providers regularly and as recommended Regular gentle exercise such as walking   Patient verbalizes understanding of instructions and care plan provided today and agrees to view in MyChart. Active MyChart status and patient understanding of how to access instructions and care plan via MyChart confirmed with patient.     The patient has been provided with contact information for the care management team and has been advised to call with any health related questions or concerns.   Please call the care guide team at [phone] if you need to cancel or reschedule your appointment.   Please call the Suicide and Crisis Lifeline: 988 call 1-800-273-TALK (toll free, 24 hour hotline) if you are experiencing a Mental Health or Behavioral Health Crisis or need someone to talk to.  Arvin Seip RN, BSN, CCM [employer], Population Health Case Manager Phone: [phone]

## 2023-09-01 NOTE — Transitions of Care (Post Inpatient/ED Visit) (Signed)
 09/01/2023  Name: Paul Campos MRN: [id number] DOB: 10-13-1990  Today's TOC FU Call Status: Today's TOC FU Call Status:: Successful TOC FU Call Completed TOC FU Call Complete Date: 09/01/23 Patient's Name and Date of Birth confirmed.  Transition Care Management Follow-up Telephone Call Date of Discharge: 08/31/23 Discharge Facility: Other ([facility]) Name of Other ([facility]) [facility]charge Facility: [facility] Type of Discharge: Inpatient Admission Primary Inpatient Discharge Diagnosis:: Sickle Cell Pain Crisis How have you been since you were released from the hospital?:  (Patient states he is feeling better.  He states he is having mild aches however home pain medications are managing.) Any questions or concerns?: No  Items Reviewed: Did you receive and understand the discharge instructions provided?: Yes Medications obtained,verified, and reconciled?: Yes (Medications Reviewed) Any new allergies since your discharge?: No Dietary orders reviewed?: Yes Type of Diet Ordered:: regular diet Do you have support at home?: Yes People in Home [RPT]: significant other Name of Support/Comfort Primary Source: Paul Campos  Medications Reviewed Today: Medications Reviewed Today     Reviewed by Paul Alaimo E, RN (Registered Nurse) on 09/01/23 at [phone]  Med List Status: <None>   Medication Order Taking? Sig Documenting Provider Last Dose Status Informant  ergocalciferol  (VITAMIN D2) 1.25 MG (50000 UT) capsule [id number] Yes Take 1 capsule (50,000 Units total) by mouth once a week. Paseda, Folashade R, FNP  Active   folic acid  (FOLVITE ) 800 MCG tablet [id number] Yes Take 1 tablet (800 mcg total) by mouth daily. Paseda, Folashade R, FNP  Active   hydroxyurea  (HYDREA ) 500 MG capsule [id number] Yes Take 2 capsules (1,000 mg total) by mouth daily. May take with food to minimize GI side effects. Paseda, Folashade R, FNP  Active   ibuprofen  (ADVIL ) 800 MG tablet [id number] Yes Take 1  tablet (800 mg total) by mouth every 8 (eight) hours as needed for moderate pain (pain score 4-6). Paseda, Folashade R, FNP  Active   Oxycodone  HCl 20 MG TABS [id number] Yes Take 1 tablet (20 mg total) by mouth every 6 (six) hours as needed. Paseda, Folashade R, FNP  Active             Home Care and Equipment/Supplies: Were Home Health Services Ordered?: No Any new equipment or medical supplies ordered?: No  Functional Questionnaire: Do you need assistance with bathing/showering or dressing?: No Do you need assistance with meal preparation?: No Do you need assistance with eating?: No Do you have difficulty maintaining continence: No Do you need assistance with getting out of bed/getting out of a chair/moving?: No Do you have difficulty managing or taking your medications?: No  Follow up appointments reviewed: PCP Follow-up appointment confirmed?:  (RN case manager offered to assist patient with primary provider follow up appointment.  Patient states he will call and schedule follow up appointment with primary care provider) MD Provider Line [phone] Given: No (patient states he has the contact phone number for his primary care provider office and day hospital) Specialist Hospital Follow-up appointment confirmed?: NA Do you need transportation to your follow-up appointment?: No Do you understand care options if your condition(s) worsen?: Yes-patient verbalized understanding  SDOH Interventions Today    Flowsheet Row Most Recent Value  SDOH Interventions   Food Insecurity Interventions Intervention Not Indicated  Housing Interventions Intervention Not Indicated  Transportation Interventions Intervention Not Indicated  Utilities Interventions Intervention Not Indicated    Paul Seip RN, BSN, CCM [facility]  [facility], Population Health Case Manager Phone: [phone]

## 2023-09-05 ENCOUNTER — Other Ambulatory Visit: Payer: Self-pay

## 2023-09-12 ENCOUNTER — Other Ambulatory Visit: Payer: Self-pay | Admitting: Nurse Practitioner

## 2023-09-12 DIAGNOSIS — G8929 Other chronic pain: Secondary | ICD-10-CM

## 2023-09-12 DIAGNOSIS — D571 Sickle-cell disease without crisis: Secondary | ICD-10-CM

## 2023-09-12 MED ORDER — IBUPROFEN 800 MG PO TABS: 800.0000 mg | ORAL_TABLET | Freq: Three times a day (TID) | ORAL | 3 refills | Status: DC | PRN

## 2023-09-12 MED ORDER — OXYCODONE HCL 20 MG PO TABS: 20.0000 mg | ORAL_TABLET | Freq: Four times a day (QID) | ORAL | 0 refills | Status: DC | PRN

## 2023-09-12 NOTE — Progress Notes (Signed)
 Reviewed PDMP substance reporting system prior to prescribing opiate medications. No inconsistencies noted.    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  Dispense: 60 tablet; Refill: 0 - ibuprofen  (ADVIL ) 800 MG tablet; Take 1 tablet (800 mg total) by mouth every 8 (eight) hours as needed for moderate pain (pain score 4-6).  Dispense: 30 tablet; Refill: 3  2. Other chronic pain  - Oxycodone  HCl 20 MG TABS; Take 1 tablet (20 mg total) by mouth every 6 (six) hours as needed.  Dispense: 60 tablet; Refill: 0 - ibuprofen  (ADVIL ) 800 MG tablet; Take 1 tablet (800 mg total) by mouth every 8 (eight) hours as needed for moderate pain (pain score 4-6).  Dispense: 30 tablet; Refill: 3

## 2023-09-26 ENCOUNTER — Other Ambulatory Visit: Payer: Self-pay

## 2023-09-26 DIAGNOSIS — G8929 Other chronic pain: Secondary | ICD-10-CM

## 2023-09-26 DIAGNOSIS — D571 Sickle-cell disease without crisis: Secondary | ICD-10-CM

## 2023-09-26 MED ORDER — OXYCODONE HCL 20 MG PO TABS: 20.0000 mg | ORAL_TABLET | Freq: Four times a day (QID) | ORAL | 0 refills | Status: DC | PRN

## 2023-09-26 MED ORDER — IBUPROFEN 800 MG PO TABS: 800.0000 mg | ORAL_TABLET | Freq: Three times a day (TID) | ORAL | 3 refills | Status: DC | PRN

## 2023-09-26 NOTE — Telephone Encounter (Signed)
 Please advise [facility]

## 2023-10-11 ENCOUNTER — Other Ambulatory Visit: Payer: Self-pay

## 2023-10-11 DIAGNOSIS — D571 Sickle-cell disease without crisis: Secondary | ICD-10-CM

## 2023-10-11 DIAGNOSIS — G8929 Other chronic pain: Secondary | ICD-10-CM

## 2023-10-11 MED ORDER — OXYCODONE HCL 20 MG PO TABS: 20.0000 mg | ORAL_TABLET | Freq: Four times a day (QID) | ORAL | 0 refills | Status: DC | PRN

## 2023-10-11 MED ORDER — IBUPROFEN 800 MG PO TABS: 800.0000 mg | ORAL_TABLET | Freq: Three times a day (TID) | ORAL | 3 refills | Status: DC | PRN

## 2023-10-11 NOTE — Telephone Encounter (Signed)
 Reviewed PDMP substance reporting system prior to prescribing opiate medications. No inconsistencies noted.    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  Dispense: 60 tablet; Refill: 0 - ibuprofen  (ADVIL ) 800 MG tablet; Take 1 tablet (800 mg total) by mouth every 8 (eight) hours as needed for moderate pain (pain score 4-6).  Dispense: 30 tablet; Refill: 3  2. Other chronic pain  - Oxycodone  HCl 20 MG TABS; Take 1 tablet (20 mg total) by mouth every 6 (six) hours as needed.  Dispense: 60 tablet; Refill: 0 - ibuprofen  (ADVIL ) 800 MG tablet; Take 1 tablet (800 mg total) by mouth every 8 (eight) hours as needed for moderate pain (pain score 4-6).  Dispense: 30 tablet; Refill: 3

## 2023-10-11 NOTE — Telephone Encounter (Signed)
 Please advise [facility]

## 2023-10-27 ENCOUNTER — Other Ambulatory Visit: Payer: Self-pay

## 2023-10-27 DIAGNOSIS — G8929 Other chronic pain: Secondary | ICD-10-CM

## 2023-10-27 DIAGNOSIS — D571 Sickle-cell disease without crisis: Secondary | ICD-10-CM

## 2023-10-27 NOTE — Telephone Encounter (Signed)
 Please advise [facility]

## 2023-10-28 MED ORDER — IBUPROFEN 800 MG PO TABS
800.0000 mg | ORAL_TABLET | Freq: Three times a day (TID) | ORAL | 3 refills | Status: DC | PRN
Start: 2023-10-28 — End: 2023-11-30

## 2023-10-28 MED ORDER — OXYCODONE HCL 20 MG PO TABS: 20.0000 mg | ORAL_TABLET | Freq: Four times a day (QID) | ORAL | 0 refills | Status: DC | PRN

## 2023-11-08 ENCOUNTER — Ambulatory Visit (INDEPENDENT_AMBULATORY_CARE_PROVIDER_SITE_OTHER): Payer: Self-pay | Admitting: Nurse Practitioner

## 2023-11-08 ENCOUNTER — Encounter: Payer: Self-pay | Admitting: Nurse Practitioner

## 2023-11-08 VITALS — BP 112/69 | HR 71 | Temp 97.3°F | Wt 181.0 lb

## 2023-11-08 DIAGNOSIS — E559 Vitamin D deficiency, unspecified: Secondary | ICD-10-CM | POA: Diagnosis not present

## 2023-11-08 DIAGNOSIS — Z0000 Encounter for general adult medical examination without abnormal findings: Secondary | ICD-10-CM

## 2023-11-08 DIAGNOSIS — Z13220 Encounter for screening for lipoid disorders: Secondary | ICD-10-CM

## 2023-11-08 DIAGNOSIS — D571 Sickle-cell disease without crisis: Secondary | ICD-10-CM

## 2023-11-08 DIAGNOSIS — G894 Chronic pain syndrome: Secondary | ICD-10-CM

## 2023-11-08 DIAGNOSIS — G8929 Other chronic pain: Secondary | ICD-10-CM

## 2023-11-08 MED ORDER — HYDROXYUREA 500 MG PO CAPS: 1000.0000 mg | ORAL_CAPSULE | Freq: Every day | ORAL | 2 refills | Status: DC

## 2023-11-08 NOTE — Assessment & Plan Note (Signed)
 Last vitamin D  Lab Results  Component Value Date   VD25OH 16.6 (L) 08/08/2023   Vitamin D  deficiency managed with vitamin D  supplementation. - Continue vitamin D  supplementation at 50,000 units once a week.

## 2023-11-08 NOTE — Progress Notes (Signed)
 Complete physical exam  Patient: Paul Campos   DOB: 1990/10/25   32 y.o. Male  MRN: [id number]  Subjective:    Chief Complaint  Patient presents with   Annual Exam      Discussed the use of AI scribe software for clinical note transcription with the patient, who gave verbal consent to proceed.  History of Present Illness Paul Campos is a 33 year old male  has a past medical history of Anemia (1993), Chronic pain syndrome (05/07/2020), Episodic headache (07/17/2015), Headache, Marijuana use (08/08/2023), Sickle cell anemia (HCC), and Vitamin D  deficiency (11/02/2011).  who presents for an annual physical exam.  He has a history of sickle cell disease and was recently hospitalized for a sickle cell crisis. Since then, he experiences intermittent pain, which he attributes to changes in the weather. The pain occurs in his arms, legs, and sometimes his back, with current pain localized to his left shoulder, rated at a level of 3 out of 10. He has had 4 hospital visit with one requiring admission since his last visit    For pain management, he uses ibuprofen  800 mg every eight hours as needed for mild pain and oxycodone  20 mg every six hours as needed for more severe pain. He last took oxycodone  two days prior to this visit and confirms that he does not use it daily, only when the pain is severe. Also uses tylenol  as needed  He is currently taking folic acid  800 mcg daily and hydroxyurea , although he is unsure of the exact dosage, he takes two pills daily. He also takes vitamin D  50,000 units once a week.  No fever, chills, chest pain, cough, wheezing, shortness of breath, abdominal pain, nausea, or vomiting.  Socially, he stays active by taking care of his children, a 72 year old, a nearly 48-year-old, and a nearly 56-year-old. He follows a general diet with some influence from an alkaline diet introduced by his brother. He does not smoke or vape, consumes alcohol only on special occasions,  and occasionally uses marijuana edibles.    Assessment and Plan Assessment & Plan  Follow-Up Follow-up every three months for ongoing management of conditions. Discussed referral to hematologist and eye doctor. - Schedule follow-up every three months. - Provide phone number for hematologist referral. - Provide phone number for eye doctor referral.    Most recent fall risk assessment:    09/01/2023    9:19 AM  Fall Risk   Falls in the past year? 0  Number falls in past yr: 0  Injury with Fall? 0     Most recent depression screenings:    11/08/2023    8:05 AM 09/01/2023    9:57 AM  PHQ 2/9 Scores  PHQ - 2 Score 0 0  PHQ- 9 Score 0         Patient Care Team: Jaslin Novitski R, FNP as PCP - General (Nurse Practitioner) Tilford Bertram HERO, FNP as Referring Physician (Family Medicine)   Outpatient Medications Prior to Visit  Medication Sig   acetaminophen  (TYLENOL ) 500 MG tablet Take 1,000 mg by mouth.   ergocalciferol  (VITAMIN D2) 1.25 MG (50000 UT) capsule Take 1 capsule (50,000 Units total) by mouth once a week.   folic acid  (FOLVITE ) 800 MCG tablet Take 1 tablet (800 mcg total) by mouth daily.   ibuprofen  (ADVIL ) 800 MG tablet Take 1 tablet (800 mg total) by mouth every 8 (eight) hours as needed for moderate pain (pain score 4-6).   Oxycodone  HCl 20  MG TABS Take 1 tablet (20 mg total) by mouth every 6 (six) hours as needed.   [DISCONTINUED] hydroxyurea  (HYDREA ) 500 MG capsule Take 2 capsules (1,000 mg total) by mouth daily. May take with food to minimize GI side effects.   No facility-administered medications prior to visit.    Review of Systems     Objective:     BP 112/69   Pulse 71   Temp (!) 97.3 F (36.3 C)   Wt 181 lb (82.1 kg)   SpO2 100%   BMI 23.88 kg/m    Physical Exam Vitals and nursing note reviewed.  Constitutional:      General: He is not in acute distress.    Appearance: Normal appearance. He is not ill-appearing, toxic-appearing or  diaphoretic.  HENT:     Right Ear: Tympanic membrane, ear canal and external ear normal. There is no impacted cerumen.     Left Ear: Tympanic membrane, ear canal and external ear normal. There is no impacted cerumen.     Nose: Nose normal. No congestion or rhinorrhea.     Mouth/Throat:     Mouth: Mucous membranes are moist.     Pharynx: Oropharynx is clear. No oropharyngeal exudate or posterior oropharyngeal erythema.  Eyes:     General: No scleral icterus.       Right eye: No discharge.        Left eye: No discharge.     Extraocular Movements: Extraocular movements intact.     Conjunctiva/sclera: Conjunctivae normal.  Neck:     Vascular: No carotid bruit.  Cardiovascular:     Rate and Rhythm: Normal rate and regular rhythm.     Pulses: Normal pulses.     Heart sounds: Normal heart sounds. No murmur heard.    No friction rub. No gallop.  Pulmonary:     Effort: Pulmonary effort is normal. No respiratory distress.     Breath sounds: Normal breath sounds. No stridor. No wheezing, rhonchi or rales.  Chest:     Chest wall: No tenderness.  Abdominal:     General: Bowel sounds are normal. There is no distension.     Palpations: Abdomen is soft. There is no mass.     Tenderness: There is no abdominal tenderness. There is no right CVA tenderness, left CVA tenderness, guarding or rebound.     Hernia: No hernia is present.  Musculoskeletal:        General: Tenderness present. No swelling, deformity or signs of injury.     Cervical back: Normal range of motion and neck supple. No rigidity or tenderness.     Right lower leg: No edema.     Left lower leg: No edema.     Comments: Tenderness on palpation and range of motion of left shoulder, skin is warm and dry no redness or swelling noted,has palpable radial pulse  Lymphadenopathy:     Cervical: No cervical adenopathy.  Skin:    General: Skin is warm and dry.     Capillary Refill: Capillary refill takes less than 2 seconds.      Coloration: Skin is not jaundiced or pale.     Findings: No bruising, erythema, lesion or rash.  Neurological:     Mental Status: He is alert and oriented to person, place, and time.     Cranial Nerves: No cranial nerve deficit.     Motor: No weakness.     Gait: Gait normal.  Psychiatric:        Mood  and Affect: Mood normal.        Behavior: Behavior normal.        Thought Content: Thought content normal.        Judgment: Judgment normal.     No results found for any visits on 11/08/23.     Assessment & Plan:    Routine Health Maintenance and Physical Exam  Immunization History  Administered Date(s) Administered   Fluzone Influenza virus vaccine,trivalent (IIV3), split virus 01/21/2014   HIB (PRP-OMP) 11/01/2011   HIB (PRP-T) 11/01/2011   Hepatitis B, PED/ADOLESCENT 07/09/2008   Influenza Split 01/31/2012   Influenza, Seasonal, Injecte, Preservative Fre 03/05/2013   Influenza,inj,Quad PF,6+ Mos 11/13/2020, 12/29/2021   Influenza,inj,quad, With Preservative 03/05/2013, 01/21/2014   Influenza-Unspecified 11/07/2019   Meningococcal Conjugate 06/21/2006, 06/27/2007   Pneumococcal Conjugate PCV 7 06/27/2007   Pneumococcal Polysaccharide-23 03/05/2013, 08/05/2020   Pneumococcal-Unspecified 06/27/2007   Tdap 04/29/2020    Health Maintenance  Topic Date Due   COVID-19 Vaccine (1) Never done   Hepatitis B Vaccines 19-59 Average Risk (2 of 3 - 3-dose series) 08/06/2008   HPV VACCINES (1 - 3-dose SCDM series) Never done   Pneumococcal Vaccine (3 of 3 - PCV) 08/05/2021   Influenza Vaccine  05/22/2024 (Originally 09/23/2023)   DTaP/Tdap/Td (2 - Td or Tdap) 04/30/2030   Hepatitis C Screening  Completed   HIV Screening  Completed   Meningococcal B Vaccine  Aged Out    Discussed health benefits of physical activity, and encouraged him to engage in regular exercise appropriate for his age and condition.  Problem List Items Addressed This Visit       Other   Hb-SS disease  without crisis (HCC)   Sickle-cell disease with chronic pain Chronic pain associated with sickle-cell disease, with recent hospitalization for sickle cell crisis. Pain is intermittent and exacerbated by weather changes. Current pain level is 3/10, with pain in the left shoulder. Continue ibuprofen  800 mg 3 times daily as needed mild pain, oxycodone  20 mg every 6 hours as needed for moderate pain, advised to keep medication in a safe and secured cabinet.  Discussed potential side effects of oxycodone , including constipation, and advised on preventive measures. Referral to hematologist for potential gene therapy discussed. - Ensure adherence to hydroxyurea  1000 mg daily and folic acid  800 mcg daily.  Benefits of both medications discussed - Encourage hydration and avoidance of extreme temperatures, stress, and infection triggers. - Advise on preventive measures for oxycodone -induced constipation. - he was Referred to hematologist for potential gene therapy but has not heard from them, the contact information provided encouraged to call and follow-up on the referral  No eye exam in the past 12 months. Encouraged to have annual eye exams. Discussed staying up to date with vaccinations, including pneumonia, HPV, COVID, and flu vaccines. Declined flu vaccine today but encouraged to consider it. - Refer to eye doctor for annual exam. - Encourage staying up to date with vaccinations, including pneumonia, HPV, COVID, and flu vaccines.    Component Ref Range & Units 2 wk ago  WBC (White Blood Cell Count) 3.2 - 9.8 x10^9/L 12.3 High   Hemoglobin 13.7 - 17.3 g/dL 89.3 Low   Hematocrit 60.9 - 49.0 % 29.8 Low   Platelets 150 - 450 x10^9/L 284  MCV (Mean Corpuscular Volume) 80 - 98 fL 69 Low   MCH (Mean Corpuscular Hemoglobin) 26.5 - 34.0 pg 24.6 Low   MCHC (Mean Corpuscular Hemoglobin Concentration) 31.5 - 36.3 % 35.6  RBC (Red Blood  Cell Count) 4.37 - 5.74 x10^12/L 4.31 Low   RDW-CV (Red Cell  Distribution Width) 11.5 - 14.5 % 15.2 High   NRBC (Nucleated Red Blood Cell Count) <0.00 x10^9/L 0.11 High   NRBC % (Nucleated Red Blood Cell %) % 0.9  MPV (Mean Platelet Volume) 7.2 - 11.7 fL 10.9    Component Ref Range & Units 2 wk ago  Sodium 135 - 145 mmol/L 140  Potassium 3.5 - 5.0 mmol/L 3.5  Chloride 98 - 108 mmol/L 107  Carbon Dioxide (CO2) 21 - 30 mmol/L 23  Urea Nitrogen (BUN) 7 - 20 mg/dL 5 Low   Creatinine 0.6 - 1.3 mg/dL 0.8  Glucose 70 - 859 mg/dL 881  Comment: Interpretive Data: Above is the NONFASTING reference range.  Below are the FASTING reference ranges: NORMAL:      70-99 mg/dL PREDIABETES: 899-874 mg/dL DIABETES:    > 874 mg/dL  Calcium 8.7 - 89.7 mg/dL 8.4 Low   Anion Gap 3 - 12 mmol/L 10  BUN/CREA Ratio 6 - 27 6  Glomerular Filtration Rate (eGFR) mL/min/1.73sq m 121         Relevant Medications   hydroxyurea  (HYDREA ) 500 MG capsule   Other Relevant Orders   Ferritin   Chronic pain syndrome   Continue ibuprofen  800 mg every 8 hours as needed mild pain, Tylenol  as needed, oxycodone  20 mg every 6 hours as needed moderate pain       Relevant Medications   acetaminophen  (TYLENOL ) 500 MG tablet   Other Relevant Orders   ToxAssure Flex 15, Ur   Vitamin D  deficiency   Last vitamin D  Lab Results  Component Value Date   VD25OH 16.6 (L) 08/08/2023   Vitamin D  deficiency managed with vitamin D  supplementation. - Continue vitamin D  supplementation at 50,000 units once a week.      Relevant Orders   VITAMIN D  25 Hydroxy (Vit-D Deficiency, Fractures)   Chronic pain   Relevant Medications   acetaminophen  (TYLENOL ) 500 MG tablet   Other Relevant Orders   ToxAssure Flex 15, Ur   ToxAssure Flex 15, Ur   Annual physical exam - Primary   Adult Wellness Visit Annual physical examination conducted. Labs for cholesterol and lipid panel to be done today due to fasting status. Advised against marijuana use due to potential interaction with  oxycodone . - Order lipid panel labs today. - Encourage heart-healthy, low-salt, low-fat diet. - Encourage moderate exercise as tolerated. - Advise hydration with at least 64 ounces of water daily. - Advise use of seatbelts when riding in a car. - Discourage marijuana use due to potential interaction with oxycodone .       Other Visit Diagnoses       Screening for lipid disorders       Relevant Orders   Lipid panel      Return in about 3 months (around 02/07/2024), or SCD.     Kristoff Coonradt R Nick Armel, FNP

## 2023-11-08 NOTE — Assessment & Plan Note (Addendum)
 Sickle-cell disease with chronic pain Chronic pain associated with sickle-cell disease, with recent hospitalization for sickle cell crisis. Pain is intermittent and exacerbated by weather changes. Current pain level is 3/10, with pain in the left shoulder. Continue ibuprofen  800 mg 3 times daily as needed mild pain, oxycodone  20 mg every 6 hours as needed for moderate pain, advised to keep medication in a safe and secured cabinet.  Discussed potential side effects of oxycodone , including constipation, and advised on preventive measures. Referral to hematologist for potential gene therapy discussed. - Ensure adherence to hydroxyurea  1000 mg daily and folic acid  800 mcg daily.  Benefits of both medications discussed - Encourage hydration and avoidance of extreme temperatures, stress, and infection triggers. - Advise on preventive measures for oxycodone -induced constipation. - he was Referred to hematologist for potential gene therapy but has not heard from them, the contact information provided encouraged to call and follow-up on the referral  No eye exam in the past 12 months. Encouraged to have annual eye exams. Discussed staying up to date with vaccinations, including pneumonia, HPV, COVID, and flu vaccines. Declined flu vaccine today but encouraged to consider it. - Refer to eye doctor for annual exam. - Encourage staying up to date with vaccinations, including pneumonia, HPV, COVID, and flu vaccines.    Component Ref Range & Units 2 wk ago  WBC (White Blood Cell Count) 3.2 - 9.8 x10^9/L 12.3 High   Hemoglobin 13.7 - 17.3 g/dL 89.3 Low   Hematocrit 60.9 - 49.0 % 29.8 Low   Platelets 150 - 450 x10^9/L 284  MCV (Mean Corpuscular Volume) 80 - 98 fL 69 Low   MCH (Mean Corpuscular Hemoglobin) 26.5 - 34.0 pg 24.6 Low   MCHC (Mean Corpuscular Hemoglobin Concentration) 31.5 - 36.3 % 35.6  RBC (Red Blood Cell Count) 4.37 - 5.74 x10^12/L 4.31 Low   RDW-CV (Red Cell Distribution Width) 11.5 -  14.5 % 15.2 High   NRBC (Nucleated Red Blood Cell Count) <0.00 x10^9/L 0.11 High   NRBC % (Nucleated Red Blood Cell %) % 0.9  MPV (Mean Platelet Volume) 7.2 - 11.7 fL 10.9    Component Ref Range & Units 2 wk ago  Sodium 135 - 145 mmol/L 140  Potassium 3.5 - 5.0 mmol/L 3.5  Chloride 98 - 108 mmol/L 107  Carbon Dioxide (CO2) 21 - 30 mmol/L 23  Urea Nitrogen (BUN) 7 - 20 mg/dL 5 Low   Creatinine 0.6 - 1.3 mg/dL 0.8  Glucose 70 - 859 mg/dL 881  Comment: Interpretive Data: Above is the NONFASTING reference range.  Below are the FASTING reference ranges: NORMAL:      70-99 mg/dL PREDIABETES: 899-874 mg/dL DIABETES:    > 874 mg/dL  Calcium 8.7 - 89.7 mg/dL 8.4 Low   Anion Gap 3 - 12 mmol/L 10  BUN/CREA Ratio 6 - 27 6  Glomerular Filtration Rate (eGFR) mL/min/1.73sq m 121

## 2023-11-08 NOTE — Patient Instructions (Addendum)
  Please call eye doctor to schedule your yearly eye exam  [facility] Ophthalmology - [facility] [address] [place] [address]  P:  [phone]   Hematology [facility] [address] [place] [address]  P:  [phone]  Please get Pneumovax 20 vaccine at the pharmacy.      It is important that you exercise regularly at least 30 minutes 5 times a week as tolerated  Think about what you will eat, plan ahead. Choose  clean, green, fresh or frozen over canned, processed or packaged foods which are more sugary, salty and fatty. 70 to 75% of food eaten should be vegetables and fruit. Three meals at set times with snacks allowed between meals, but they must be fruit or vegetables. Aim to eat over a 12 hour period , example 7 am to 7 pm, and STOP after  your last meal of the day. Drink water,generally about 64 ounces per day, no other drink is as healthy. Fruit juice is best enjoyed in a healthy way, by EATING the fruit.  Thanks for choosing [facility] we consider it a privelige to serve you.

## 2023-11-08 NOTE — Assessment & Plan Note (Signed)
 Adult Wellness Visit Annual physical examination conducted. Labs for cholesterol and lipid panel to be done today due to fasting status. Advised against marijuana use due to potential interaction with oxycodone . - Order lipid panel labs today. - Encourage heart-healthy, low-salt, low-fat diet. - Encourage moderate exercise as tolerated. - Advise hydration with at least 64 ounces of water daily. - Advise use of seatbelts when riding in a car. - Discourage marijuana use due to potential interaction with oxycodone .

## 2023-11-08 NOTE — Assessment & Plan Note (Signed)
 Continue ibuprofen  800 mg every 8 hours as needed mild pain, Tylenol  as needed, oxycodone  20 mg every 6 hours as needed moderate pain

## 2023-11-11 ENCOUNTER — Ambulatory Visit: Payer: Self-pay | Admitting: Nurse Practitioner

## 2023-11-13 LAB — TOXASSURE FLEX 15, UR
6-ACETYLMORPHINE IA: NEGATIVE ng/mL
7-aminoclonazepam: NOT DETECTED ng/mg{creat}
AMPHETAMINES IA: NEGATIVE ng/mL
Alpha-hydroxyalprazolam: NOT DETECTED ng/mg{creat}
Alpha-hydroxymidazolam: NOT DETECTED ng/mg{creat}
Alpha-hydroxytriazolam: NOT DETECTED ng/mg{creat}
Alprazolam: NOT DETECTED ng/mg{creat}
BARBITURATES IA: NEGATIVE ng/mL
BUPRENORPHINE: NEGATIVE
Benzodiazepines: NEGATIVE
Buprenorphine: NOT DETECTED ng/mg{creat}
COCAINE METABOLITE IA: NEGATIVE ng/mL
Clonazepam: NOT DETECTED ng/mg{creat}
Creatinine: 114 mg/dL (ref >=20)
Desalkylflurazepam: NOT DETECTED ng/mg{creat}
Desmethyldiazepam: NOT DETECTED ng/mg{creat}
Desmethylflunitrazepam: NOT DETECTED ng/mg{creat}
Diazepam: NOT DETECTED ng/mg{creat}
ETHYL ALCOHOL Enzymatic: NEGATIVE g/dL
FENTANYL: NEGATIVE
Fentanyl: NOT DETECTED ng/mg{creat}
Flunitrazepam: NOT DETECTED ng/mg{creat}
Lorazepam: NOT DETECTED ng/mg{creat}
METHADONE IA: NEGATIVE ng/mL
METHADONE MTB IA: NEGATIVE ng/mL
Midazolam: NOT DETECTED ng/mg{creat}
Norbuprenorphine: NOT DETECTED ng/mg{creat}
Norfentanyl: NOT DETECTED ng/mg{creat}
OPIATE CLASS IA: NEGATIVE ng/mL
OXYCODONE CLASS IA: NEGATIVE ng/mL
Oxazepam: NOT DETECTED ng/mg{creat}
PHENCYCLIDINE IA: NEGATIVE ng/mL
TAPENTADOL, IA: NEGATIVE ng/mL
TRAMADOL IA: NEGATIVE ng/mL
Temazepam: NOT DETECTED ng/mg{creat}

## 2023-11-13 LAB — CANNABINOIDS, MS, UR RFX
Cannabinoids Confirmation: POSITIVE
Carboxy-THC: 81 ng/mg{creat}

## 2023-11-13 LAB — LIPID PANEL
Chol/HDL Ratio: 4 ratio (ref 0.0–5.0)
Cholesterol, Total: 119 mg/dL (ref 100–199)
HDL: 30 mg/dL — ABNORMAL LOW (ref >39)
LDL Chol Calc (NIH): 64 mg/dL (ref 0–99)
Triglycerides: 142 mg/dL (ref 0–149)
VLDL Cholesterol Cal: 25 mg/dL (ref 5–40)

## 2023-11-13 LAB — VITAMIN D 25 HYDROXY (VIT D DEFICIENCY, FRACTURES): Vit D, 25-Hydroxy: 22.7 ng/mL — ABNORMAL LOW (ref 30.0–100.0)

## 2023-11-13 LAB — FERRITIN: Ferritin: 177 ng/mL (ref 30–400)

## 2023-11-14 ENCOUNTER — Other Ambulatory Visit: Payer: Self-pay | Admitting: Nurse Practitioner

## 2023-11-14 DIAGNOSIS — D571 Sickle-cell disease without crisis: Secondary | ICD-10-CM

## 2023-11-14 DIAGNOSIS — G8929 Other chronic pain: Secondary | ICD-10-CM

## 2023-11-15 ENCOUNTER — Other Ambulatory Visit: Payer: Self-pay

## 2023-11-15 DIAGNOSIS — G8929 Other chronic pain: Secondary | ICD-10-CM

## 2023-11-15 DIAGNOSIS — D571 Sickle-cell disease without crisis: Secondary | ICD-10-CM

## 2023-11-15 MED ORDER — OXYCODONE HCL 20 MG PO TABS: 20.0000 mg | ORAL_TABLET | Freq: Four times a day (QID) | ORAL | 0 refills | Status: DC | PRN

## 2023-11-15 NOTE — Telephone Encounter (Signed)
Request was sent to provider.

## 2023-11-15 NOTE — Telephone Encounter (Signed)
Pt is requesting a refill on Oxycodone

## 2023-11-15 NOTE — Telephone Encounter (Signed)
 Reviewed PDMP substance reporting system prior to prescribing opiate medications. No inconsistencies noted.   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  Dispense: 60 tablet; Refill: 0  2. Other chronic pain  - Oxycodone HCl 20 MG TABS; Take 1 tablet (20 mg total) by mouth every 6 (six) hours as needed.  Dispense: 60 tablet; Refill: 0

## 2023-11-30 ENCOUNTER — Other Ambulatory Visit: Payer: Self-pay | Admitting: Nurse Practitioner

## 2023-11-30 DIAGNOSIS — G8929 Other chronic pain: Secondary | ICD-10-CM

## 2023-11-30 DIAGNOSIS — D571 Sickle-cell disease without crisis: Secondary | ICD-10-CM

## 2023-11-30 MED ORDER — IBUPROFEN 800 MG PO TABS: 800.0000 mg | ORAL_TABLET | Freq: Three times a day (TID) | ORAL | 3 refills | Status: DC | PRN

## 2023-11-30 MED ORDER — OXYCODONE HCL 20 MG PO TABS: 20.0000 mg | ORAL_TABLET | Freq: Four times a day (QID) | ORAL | 0 refills | Status: DC | PRN

## 2023-11-30 NOTE — Progress Notes (Signed)
 Reviewed PDMP substance reporting system prior to prescribing opiate medications. No inconsistencies noted.    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  Dispense: 60 tablet; Refill: 0 - ibuprofen  (ADVIL ) 800 MG tablet; Take 1 tablet (800 mg total) by mouth every 8 (eight) hours as needed for moderate pain (pain score 4-6).  Dispense: 30 tablet; Refill: 3  2. Other chronic pain  - Oxycodone  HCl 20 MG TABS; Take 1 tablet (20 mg total) by mouth every 6 (six) hours as needed.  Dispense: 60 tablet; Refill: 0 - ibuprofen  (ADVIL ) 800 MG tablet; Take 1 tablet (800 mg total) by mouth every 8 (eight) hours as needed for moderate pain (pain score 4-6).  Dispense: 30 tablet; Refill: 3

## 2023-12-13 ENCOUNTER — Other Ambulatory Visit: Payer: Self-pay

## 2023-12-13 DIAGNOSIS — G8929 Other chronic pain: Secondary | ICD-10-CM

## 2023-12-13 DIAGNOSIS — D571 Sickle-cell disease without crisis: Secondary | ICD-10-CM

## 2023-12-13 MED ORDER — OXYCODONE HCL 20 MG PO TABS: 20.0000 mg | ORAL_TABLET | Freq: Four times a day (QID) | ORAL | 0 refills | Status: DC | PRN

## 2023-12-13 NOTE — Telephone Encounter (Signed)
 Please advise [facility]

## 2023-12-13 NOTE — Telephone Encounter (Signed)
 Reviewed PDMP substance reporting system prior to prescribing opiate medications. No inconsistencies noted.   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  Dispense: 60 tablet; Refill: 0  2. Other chronic pain  - Oxycodone HCl 20 MG TABS; Take 1 tablet (20 mg total) by mouth every 6 (six) hours as needed.  Dispense: 60 tablet; Refill: 0

## 2024-01-06 ENCOUNTER — Other Ambulatory Visit: Payer: Self-pay

## 2024-01-06 DIAGNOSIS — D571 Sickle-cell disease without crisis: Secondary | ICD-10-CM

## 2024-01-06 DIAGNOSIS — G8929 Other chronic pain: Secondary | ICD-10-CM

## 2024-01-06 MED ORDER — OXYCODONE HCL 20 MG PO TABS: 20.0000 mg | ORAL_TABLET | Freq: Four times a day (QID) | ORAL | 0 refills | Status: DC | PRN

## 2024-01-06 NOTE — Telephone Encounter (Signed)
 Reviewed PDMP substance reporting system prior to prescribing opiate medications. No inconsistencies noted.   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  Dispense: 60 tablet; Refill: 0  2. Other chronic pain  - Oxycodone HCl 20 MG TABS; Take 1 tablet (20 mg total) by mouth every 6 (six) hours as needed.  Dispense: 60 tablet; Refill: 0

## 2024-01-06 NOTE — Telephone Encounter (Signed)
 Please advise [facility]

## 2024-01-25 ENCOUNTER — Other Ambulatory Visit: Payer: Self-pay

## 2024-01-25 DIAGNOSIS — D571 Sickle-cell disease without crisis: Secondary | ICD-10-CM

## 2024-01-25 DIAGNOSIS — G8929 Other chronic pain: Secondary | ICD-10-CM

## 2024-01-25 MED ORDER — OXYCODONE HCL 20 MG PO TABS: 20.0000 mg | ORAL_TABLET | Freq: Four times a day (QID) | ORAL | 0 refills | Status: DC | PRN

## 2024-01-25 MED ORDER — IBUPROFEN 800 MG PO TABS: 800.0000 mg | ORAL_TABLET | Freq: Three times a day (TID) | ORAL | 3 refills | Status: AC | PRN

## 2024-01-25 NOTE — Telephone Encounter (Signed)
 Please advise [facility]

## 2024-02-07 ENCOUNTER — Encounter: Payer: Self-pay | Admitting: Nurse Practitioner

## 2024-02-07 ENCOUNTER — Ambulatory Visit: Payer: Self-pay | Admitting: Nurse Practitioner

## 2024-02-07 VITALS — BP 113/73 | HR 66 | Wt 180.6 lb

## 2024-02-07 DIAGNOSIS — G894 Chronic pain syndrome: Secondary | ICD-10-CM

## 2024-02-07 DIAGNOSIS — D571 Sickle-cell disease without crisis: Secondary | ICD-10-CM

## 2024-02-07 DIAGNOSIS — E559 Vitamin D deficiency, unspecified: Secondary | ICD-10-CM

## 2024-02-07 LAB — POCT URINE DIPSTICK
Blood, UA: NEGATIVE
Glucose, UA: NEGATIVE mg/dL
Ketones, POC UA: NEGATIVE mg/dL
Nitrite, UA: NEGATIVE
POC PROTEIN,UA: 30 — AB
Spec Grav, UA: 1.02 (ref 1.010–1.025)
Urobilinogen, UA: 0.2 U/dL
pH, UA: 6.5 (ref 5.0–8.0)

## 2024-02-07 MED ORDER — HYDROXYUREA 500 MG PO CAPS: 1000.0000 mg | ORAL_CAPSULE | Freq: Every day | ORAL | 2 refills | Status: AC

## 2024-02-07 MED ORDER — FOLIC ACID 800 MCG PO TABS: 800.0000 ug | ORAL_TABLET | Freq: Every day | ORAL | 3 refills | Status: AC

## 2024-02-07 MED ORDER — ERGOCALCIFEROL 1.25 MG (50000 UT) PO CAPS: 50000.0000 [IU] | ORAL_CAPSULE | ORAL | 3 refills | Status: AC

## 2024-02-07 NOTE — Assessment & Plan Note (Signed)
 Continue ibuprofen  800 mg every 8 hours as needed mild pain, Tylenol  as needed, oxycodone  20 mg every 6 hours as needed moderate pain

## 2024-02-07 NOTE — Progress Notes (Signed)
 Established Patient Office Visit  Subjective:  Patient ID: Paul Campos, male    DOB: February 01, 1991  Age: 33 y.o. MRN: [id number]  CC:  Chief Complaint  Patient presents with   3 month follow up   Sickle Cell Anemia    HPI    Discussed the use of AI scribe software for clinical note transcription with the patient, who gave verbal consent to proceed.  History of Present Illness Paul Campos is a 33 year old male   has a past medical history of Anemia Mar 08, 1991), Chronic pain syndrome (05/07/2020), Episodic headache (07/17/2015), Headache, Marijuana use (08/08/2023), Sickle cell anemia (HCC), and Vitamin D  deficiency (11/02/2011).  who presents for a three-month follow-up.  He has been experiencing intermittent symptoms related to sickle cell disease. He has visited the emergency department a couple of times since his last visit in September but was not admitted to the hospital.  Currently, he is experiencing pain in his left leg, which is his usual site for sickle cell-related pain. His current pain level is 3 out of 10. He manages his pain with ibuprofen  800 mg every eight hours as needed for mild pain, acetaminophen  1000 mg occasionally, and oxycodone  20 mg every six hours as needed for moderate to severe pain, with the last dose taken yesterday.  He is taking folic acid  800 mcg daily and has been prescribed hydroxyurea  1000 mg daily, although he has run out and needs to obtain more. He has not been taking his prescribed vitamin D  50,000 units weekly.  No fever, chest pain, shortness of breath, abdominal pain, nausea, or vomiting. He does not use recreational drugs or marijuana.  He has not seen an eye doctor in the past twelve months.   Assessment & Plan       Past Medical History:  Diagnosis Date   Anemia 1993   Chronic pain syndrome 05/07/2020   Episodic headache 07/17/2015   Headache    Marijuana use 08/08/2023   Sickle cell anemia (HCC)    Vitamin D  deficiency  11/02/2011    Past Surgical History:  Procedure Laterality Date   APPENDECTOMY      Family History  Problem Relation Age of Onset   Lung cancer Mother    Migraines Mother    Cancer Mother     Social History   Socioeconomic History   Marital status: Single    Spouse name: Not on file   Number of children: 1   Years of education: Not on file   Highest education level: GED or equivalent  Occupational History    Comment: none 09/03/19  Tobacco Use   Smoking status: Never   Smokeless tobacco: Never  Vaping Use   Vaping status: Never Used  Substance and Sexual Activity   Alcohol use: No   Drug use: Yes    Types: Marijuana   Sexual activity: Yes    Birth control/protection: Condom  Other Topics Concern   Not on file  Social History Narrative      Lives with his aunt    Social Drivers of Health   Tobacco Use: Low Risk (02/07/2024)   Patient History    Smoking Tobacco Use: Never    Smokeless Tobacco Use: Never    Passive Exposure: Not on file  Recent Concern: Tobacco Use - Medium Risk (11/26/2023)   Received from [facility]   Patient History    Smoking Tobacco Use: Former    Smokeless Tobacco Use: Never  Passive Exposure: Not on file  Financial Resource Strain: Low Risk (02/06/2024)   Overall Financial Resource Strain (CARDIA)    Difficulty of Paying Living Expenses: Not hard at all  Food Insecurity: No Food Insecurity (02/06/2024)   Epic    Worried About Programme Researcher, Broadcasting/film/video in the Last Year: Never true    Ran Out of Food in the Last Year: Never true  Transportation Needs: No Transportation Needs (02/06/2024)   Epic    Lack of Transportation (Medical): No    Lack of Transportation (Non-Medical): No  Physical Activity: Insufficiently Active (02/06/2024)   Exercise Vital Sign    Days of Exercise per Week: 3 days    Minutes of Exercise per Session: 30 min  Stress: No Stress Concern Present (02/06/2024)   [employer] of Occupational  Health - Occupational Stress Questionnaire    Feeling of Stress: Not at all  Social Connections: Moderately Isolated (02/06/2024)   Social Connection and Isolation Panel    Frequency of Communication with Friends and Family: Three times a week    Frequency of Social Gatherings with Friends and Family: Once a week    Attends Religious Services: 1 to 4 times per year    Active Member of Clubs or Organizations: No    Attends Engineer, Structural: Not on file    Marital Status: Never married  Intimate Partner Violence: Not At Risk (11/08/2023)   Epic    Fear of Current or Ex-Partner: No    Emotionally Abused: No    Physically Abused: No    Sexually Abused: No  Depression (PHQ2-9): Low Risk (02/07/2024)   Depression (PHQ2-9)    PHQ-2 Score: 0  Alcohol Screen: Low Risk (02/06/2024)   Alcohol Screen    Last Alcohol Screening Score (AUDIT): 0  Housing: Low Risk (02/06/2024)   Epic    Unable to Pay for Housing in the Last Year: No    Number of Times Moved in the Last Year: 0    Homeless in the Last Year: No  Utilities: Not At Risk (11/27/2023)   Received from [facility]   Epic    In the past 12 months has the electric, gas, oil, or water company threatened to shut off services in your home?: No  Health Literacy: Low Risk (06/26/2021)   Received from [facility]    How often do you need to have someone help you when you read instructions, pamphlets, or other written material from your doctor or pharmacy?: Never    Outpatient Medications Prior to Visit  Medication Sig Dispense Refill   acetaminophen  (TYLENOL ) 500 MG tablet Take 1,000 mg by mouth.     folic acid  (FOLVITE ) 800 MCG tablet Take 1 tablet (800 mcg total) by mouth daily. 90 tablet 3   ibuprofen  (ADVIL ) 800 MG tablet Take 1 tablet (800 mg total) by mouth every 8 (eight) hours as needed for moderate pain (pain score 4-6). 30 tablet 3   Oxycodone  HCl 20 MG TABS Take 1 tablet (20 mg  total) by mouth every 6 (six) hours as needed. 60 tablet 0   ergocalciferol  (VITAMIN D2) 1.25 MG (50000 UT) capsule Take 1 capsule (50,000 Units total) by mouth once a week. 12 capsule 3   hydroxyurea  (HYDREA ) 500 MG capsule Take 2 capsules (1,000 mg total) by mouth daily. May take with food to minimize GI side effects. 180 capsule 2   No facility-administered medications prior to visit.  Allergies[1]  ROS Review of Systems  Constitutional:  Negative for appetite change, chills, fatigue and fever.  HENT:  Negative for congestion, postnasal drip, rhinorrhea and sneezing.   Respiratory:  Negative for cough, shortness of breath and wheezing.   Cardiovascular:  Negative for chest pain, palpitations and leg swelling.  Gastrointestinal:  Negative for abdominal pain, constipation, nausea and vomiting.  Genitourinary:  Negative for difficulty urinating, dysuria, flank pain and frequency.  Musculoskeletal:  Positive for arthralgias. Negative for back pain, joint swelling and myalgias.  Skin:  Negative for color change, pallor, rash and wound.  Neurological:  Negative for dizziness, facial asymmetry, weakness, numbness and headaches.  Psychiatric/Behavioral:  Negative for behavioral problems, confusion, self-injury and suicidal ideas.       Objective:    Physical Exam Vitals and nursing note reviewed.  Constitutional:      General: He is not in acute distress.    Appearance: Normal appearance. He is not ill-appearing, toxic-appearing or diaphoretic.  Eyes:     General: No scleral icterus.       Right eye: No discharge.        Left eye: No discharge.     Extraocular Movements: Extraocular movements intact.     Conjunctiva/sclera: Conjunctivae normal.  Cardiovascular:     Rate and Rhythm: Normal rate and regular rhythm.     Pulses: Normal pulses.     Heart sounds: Normal heart sounds. No murmur heard.    No friction rub. No gallop.  Pulmonary:     Effort: Pulmonary effort is normal.  No respiratory distress.     Breath sounds: Normal breath sounds. No stridor. No wheezing, rhonchi or rales.  Chest:     Chest wall: No tenderness.  Abdominal:     General: There is no distension.     Palpations: Abdomen is soft.     Tenderness: There is no abdominal tenderness. There is no right CVA tenderness, left CVA tenderness or guarding.  Musculoskeletal:        General: No swelling, tenderness, deformity or signs of injury.     Right lower leg: No edema.     Left lower leg: No edema.  Skin:    General: Skin is warm and dry.     Capillary Refill: Capillary refill takes less than 2 seconds.     Coloration: Skin is not jaundiced or pale.     Findings: No bruising, erythema or lesion.  Neurological:     Mental Status: He is alert and oriented to person, place, and time.     Motor: No weakness.     Gait: Gait normal.  Psychiatric:        Mood and Affect: Mood normal.        Behavior: Behavior normal.        Thought Content: Thought content normal.        Judgment: Judgment normal.     BP 113/73 (BP Location: Right Arm, Patient Position: Sitting, Cuff Size: Normal)   Pulse 66   Wt 180 lb 9.6 oz (81.9 kg)   SpO2 100%   BMI 23.83 kg/m  Wt Readings from Last 3 Encounters:  02/07/24 180 lb 9.6 oz (81.9 kg)  11/08/23 181 lb (82.1 kg)  08/08/23 180 lb (81.6 kg)    No results found for: TSH Lab Results  Component Value Date   WBC 6.3 08/08/2023   HGB 12.4 (L) 08/08/2023   HCT 40.8 08/08/2023   MCV 81 08/08/2023   PLT 270  08/08/2023   Lab Results  Component Value Date   NA 137 08/08/2023   K 4.5 08/08/2023   CO2 22 08/08/2023   GLUCOSE 71 08/08/2023   BUN 7 08/08/2023   CREATININE 0.79 08/08/2023   BILITOT 0.7 08/08/2023   ALKPHOS 75 08/08/2023   AST 22 08/08/2023   ALT 12 08/08/2023   PROT 8.2 08/08/2023   ALBUMIN 4.6 08/08/2023   CALCIUM 9.6 08/08/2023   ANIONGAP 9 01/23/2022   EGFR 122 08/08/2023   Lab Results  Component Value Date   CHOL 119  11/08/2023   Lab Results  Component Value Date   HDL 30 (L) 11/08/2023   Lab Results  Component Value Date   LDLCALC 64 11/08/2023   Lab Results  Component Value Date   TRIG 142 11/08/2023   Lab Results  Component Value Date   CHOLHDL 4.0 11/08/2023   No results found for: HGBA1C    Assessment & Plan:   Problem List Items Addressed This Visit       Other   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 We discussed the need to be adherent with taking Hydrea  and other home medications. Patient was reminded of the need to seek medical attention immediately if any symptom of bleeding, anemia, or infection occurs.   Continue ibuprofen  800 mg 3 times daily as needed mild pain, oxycodone  20 mg every 6 hours as needed for moderate pain I reminded the patient that all patients receiving Schedule II narcotics must be seen for follow within the 3 months in the office.  We reviewed the terms of our pain agreement, including the need to keep medicines in a safe locked location away from children or pets, and the need to report excess sedation or constipation, measures to avoid constipation, and policies related to early refills and stolen prescriptions. According to the [place] Chronic Pain Initiative program, we have reviewed details related to analgesia, adverse effects, aberrant behaviors.    Need for yearly eye exam discussed provided ophthalmologist contact information advised to call them to schedule an appointment Encouraged to consider getting flu vaccine and pneumococcal vaccine  Up-to-date with Tdap vaccine, hepatitis B vaccine, HPV vaccine  He would like to be referred to hematologist at [facility], referral placed       Relevant Medications   hydroxyurea  (HYDREA ) 500 MG capsule   Other Relevant Orders   POCT URINE DIPSTICK (Completed)   Sickle  Cell Panel   ToxAssure Flex 15, Ur   Ambulatory referral to Hematology / Oncology   Chronic pain syndrome   Continue ibuprofen  800 mg every 8 hours as needed mild pain, Tylenol  as needed, oxycodone  20 mg every 6 hours as needed moderate pain       Relevant Orders   Sickle Cell Panel   ToxAssure Flex 15, Ur   Vitamin D  deficiency   Last vitamin D  Lab Results  Component Value Date   VD25OH 22.7 (L) 11/08/2023  Encouraged to take vitamin D  50,000 units once weekly as ordered      Relevant Medications   ergocalciferol  (VITAMIN D2) 1.25 MG (50000 UT) capsule   Other Relevant Orders   Sickle Cell Panel    Meds ordered this encounter  Medications   ergocalciferol  (VITAMIN D2) 1.25 MG (50000 UT) capsule    Sig: Take 1 capsule (50,000 Units total) by mouth once a  week.    Dispense:  12 capsule    Refill:  3   hydroxyurea  (HYDREA ) 500 MG capsule    Sig: Take 2 capsules (1,000 mg total) by mouth daily. May take with food to minimize GI side effects.    Dispense:  180 capsule    Refill:  2    Follow-up: Return in about 3 months (around 05/07/2024).    Viyan Rosamond R Fusae Florio, FNP     [1]  Allergies Allergen Reactions   Dilaudid  [Hydromorphone  Hcl] Hives   Hydromorphone  Itching    Morphine  okay.   Cefotaxime Itching and Rash    Hives

## 2024-02-07 NOTE — Assessment & Plan Note (Addendum)
 We discussed the need for good hydration, monitoring of hydration status, avoidance of heat, cold, stress, and infection triggers. We discussed the need to be adherent with taking Hydrea  and other home medications. Patient was reminded of the need to seek medical attention immediately if any symptom of bleeding, anemia, or infection occurs.   Continue ibuprofen  800 mg 3 times daily as needed mild pain, oxycodone  20 mg every 6 hours as needed for moderate pain I reminded the patient that all patients receiving Schedule II narcotics must be seen for follow within the 3 months in the office.  We reviewed the terms of our pain agreement, including the need to keep medicines in a safe locked location away from children or pets, and the need to report excess sedation or constipation, measures to avoid constipation, and policies related to early refills and stolen prescriptions. According to the [place] Chronic Pain Initiative program, we have reviewed details related to analgesia, adverse effects, aberrant behaviors.    Need for yearly eye exam discussed provided ophthalmologist contact information advised to call them to schedule an appointment Encouraged to consider getting flu vaccine and pneumococcal vaccine  Up-to-date with Tdap vaccine, hepatitis B vaccine, HPV vaccine  He would like to be referred to hematologist at [facility], referral placed

## 2024-02-07 NOTE — Patient Instructions (Addendum)
 [facility] - [facility] [address] [place] [address]  P:  [phone] F:  [phone]   [facility]  [address]  [facility] [address]  [place], [place] [address]  [phone]   It is important that you exercise regularly at least 30 minutes 5 times a week as tolerated  Think about what you will eat, plan ahead. Choose  clean, green, fresh or frozen over canned, processed or packaged foods which are more sugary, salty and fatty. 70 to 75% of food eaten should be vegetables and fruit. Three meals at set times with snacks allowed between meals, but they must be fruit or vegetables. Aim to eat over a 12 hour period , example 7 am to 7 pm, and STOP after  your last meal of the day. Drink water,generally about 64 ounces per day, no other drink is as healthy. Fruit juice is best enjoyed in a healthy way, by EATING the fruit.  Thanks for choosing [facility] we consider it a privelige to serve you.

## 2024-02-07 NOTE — Assessment & Plan Note (Signed)
 Last vitamin D  Lab Results  Component Value Date   VD25OH 22.7 (L) 11/08/2023  Encouraged to take vitamin D  50,000 units once weekly as ordered

## 2024-02-08 ENCOUNTER — Ambulatory Visit: Payer: Self-pay | Admitting: Nurse Practitioner

## 2024-02-08 LAB — CMP14+CBC/D/PLT+FER+RETIC+V...
ALT: 17 IU/L (ref 0–44)
AST: 28 IU/L (ref 0–40)
Albumin: 4.5 g/dL (ref 4.1–5.1)
Alkaline Phosphatase: 69 IU/L (ref 47–123)
BUN/Creatinine Ratio: 8 — ABNORMAL LOW (ref 9–20)
BUN: 6 mg/dL (ref 6–20)
Basophils Absolute: 0.1 x10E3/uL (ref 0.0–0.2)
Basos: 1 %
Bilirubin Total: 1.1 mg/dL (ref 0.0–1.2)
CO2: 23 mmol/L (ref 20–29)
Calcium: 9.5 mg/dL (ref 8.7–10.2)
Chloride: 102 mmol/L (ref 96–106)
Creatinine, Ser: 0.76 mg/dL (ref 0.76–1.27)
EOS (ABSOLUTE): 0.6 x10E3/uL — ABNORMAL HIGH (ref 0.0–0.4)
Eos: 8 %
Ferritin: 274 ng/mL (ref 30–400)
Globulin, Total: 2.8 g/dL (ref 1.5–4.5)
Glucose: 81 mg/dL (ref 70–99)
Hematocrit: 37.3 % — ABNORMAL LOW (ref 37.5–51.0)
Hemoglobin: 11.7 g/dL — ABNORMAL LOW (ref 13.0–17.7)
Immature Grans (Abs): 0 x10E3/uL (ref 0.0–0.1)
Immature Granulocytes: 0 %
Lymphocytes Absolute: 1.4 x10E3/uL (ref 0.7–3.1)
Lymphs: 18 %
MCH: 24.9 pg — ABNORMAL LOW (ref 26.6–33.0)
MCHC: 31.4 g/dL — ABNORMAL LOW (ref 31.5–35.7)
MCV: 80 fL (ref 79–97)
Monocytes Absolute: 1.2 x10E3/uL — ABNORMAL HIGH (ref 0.1–0.9)
Monocytes: 16 %
NRBC: 1 % — ABNORMAL HIGH (ref 0–0)
Neutrophils Absolute: 4.4 x10E3/uL (ref 1.4–7.0)
Neutrophils: 57 %
Platelets: 236 x10E3/uL (ref 150–450)
Potassium: 4.9 mmol/L (ref 3.5–5.2)
RBC: 4.69 x10E6/uL (ref 4.14–5.80)
RDW: 17.8 % — ABNORMAL HIGH (ref 11.6–15.4)
Retic Ct Pct: 4.3 % — ABNORMAL HIGH (ref 0.6–2.6)
Sodium: 140 mmol/L (ref 134–144)
Total Protein: 7.3 g/dL (ref 6.0–8.5)
Vit D, 25-Hydroxy: 14.7 ng/mL — ABNORMAL LOW (ref 30.0–100.0)
WBC: 7.6 x10E3/uL (ref 3.4–10.8)
eGFR: 122 mL/min/1.73 (ref >59)

## 2024-02-13 ENCOUNTER — Other Ambulatory Visit: Payer: Self-pay

## 2024-02-13 DIAGNOSIS — D571 Sickle-cell disease without crisis: Secondary | ICD-10-CM

## 2024-02-13 DIAGNOSIS — G8929 Other chronic pain: Secondary | ICD-10-CM

## 2024-02-13 LAB — TOXASSURE FLEX 15, UR
6-ACETYLMORPHINE IA: NEGATIVE ng/mL
7-aminoclonazepam: NOT DETECTED ng/mg{creat}
AMPHETAMINES IA: NEGATIVE ng/mL
Alpha-hydroxyalprazolam: NOT DETECTED ng/mg{creat}
Alpha-hydroxymidazolam: NOT DETECTED ng/mg{creat}
Alpha-hydroxytriazolam: NOT DETECTED ng/mg{creat}
Alprazolam: NOT DETECTED ng/mg{creat}
BARBITURATES IA: NEGATIVE ng/mL
Buprenorphine: NOT DETECTED ng/mg{creat}
COCAINE METABOLITE IA: NEGATIVE ng/mL
Clonazepam: NOT DETECTED ng/mg{creat}
Creatinine: 161 mg/dL
Desalkylflurazepam: NOT DETECTED ng/mg{creat}
Desmethyldiazepam: NOT DETECTED ng/mg{creat}
Desmethylflunitrazepam: NOT DETECTED ng/mg{creat}
Diazepam: NOT DETECTED ng/mg{creat}
ETHYL ALCOHOL Enzymatic: NEGATIVE g/dL
Fentanyl: NOT DETECTED ng/mg{creat}
Flunitrazepam: NOT DETECTED ng/mg{creat}
Lorazepam: NOT DETECTED ng/mg{creat}
METHADONE IA: NEGATIVE ng/mL
METHADONE MTB IA: NEGATIVE ng/mL
Midazolam: NOT DETECTED ng/mg{creat}
Norbuprenorphine: NOT DETECTED ng/mg{creat}
Norfentanyl: NOT DETECTED ng/mg{creat}
OPIATE CLASS IA: NEGATIVE ng/mL
Oxazepam: NOT DETECTED ng/mg{creat}
PHENCYCLIDINE IA: NEGATIVE ng/mL
TAPENTADOL, IA: NEGATIVE ng/mL
Temazepam: NOT DETECTED ng/mg{creat}

## 2024-02-13 LAB — CANNABINOIDS, MS, UR RFX
Cannabinoids Confirmation: POSITIVE
Carboxy-THC: 9 ng/mg{creat}

## 2024-02-13 LAB — OXYCODONE CLASS, MS, UR RFX
Noroxycodone: NOT DETECTED ng/mg{creat}
Noroxymorphone: NOT DETECTED ng/mg{creat}
Oxycodone Class Confirmation: NEGATIVE
Oxycodone: NOT DETECTED ng/mg{creat}
Oxymorphone: NOT DETECTED ng/mg{creat}

## 2024-02-13 LAB — TRAMADOL, MS, UR RFX
N-Desmethyltramadol: 216 ng/mg{creat}
O-Desmethyltramadol: >3106 ng/mg{creat}
Tramadol Confirmation: POSITIVE
Tramadol: 1598 ng/mg{creat}

## 2024-02-13 MED ORDER — OXYCODONE HCL 20 MG PO TABS: 20.0000 mg | ORAL_TABLET | Freq: Four times a day (QID) | ORAL | 0 refills | Status: DC | PRN

## 2024-02-13 NOTE — Telephone Encounter (Signed)
 Please advise

## 2024-02-14 ENCOUNTER — Telehealth: Payer: Self-pay

## 2024-02-14 ENCOUNTER — Other Ambulatory Visit: Payer: Self-pay

## 2024-02-14 NOTE — Telephone Encounter (Signed)
 Pharmacy Patient Advocate Encounter   Received notification from [facility] Pharm[facility] that prior authorization for OXYCODONE  is required/requested.   Insurance verification completed.   The patient is insured through [employer].   Per test claim: PA required; PA submitted to above mentioned insurance via CoverMyMeds Key/confirmation #/EOC [id number] Status is pending

## 2024-02-14 NOTE — Telephone Encounter (Signed)
 Pharmacy Patient Advocate Encounter  Received notification from [employer] that Prior Authorization for OXYCODONE  has been APPROVED from 02/14/2024 to 08/12/2024   PA #/Case ID/Reference #: [id number]

## 2024-03-05 ENCOUNTER — Other Ambulatory Visit: Payer: Self-pay

## 2024-03-05 DIAGNOSIS — D571 Sickle-cell disease without crisis: Secondary | ICD-10-CM

## 2024-03-05 DIAGNOSIS — G8929 Other chronic pain: Secondary | ICD-10-CM

## 2024-03-05 MED ORDER — OXYCODONE HCL 20 MG PO TABS: 20.0000 mg | ORAL_TABLET | Freq: Four times a day (QID) | ORAL | 0 refills | Status: DC | PRN

## 2024-03-05 NOTE — Telephone Encounter (Signed)
 Please advise-CB

## 2024-03-14 ENCOUNTER — Ambulatory Visit: Payer: Self-pay | Admitting: Nurse Practitioner

## 2024-03-24 NOTE — Discharge Summary (Signed)
 [facility] Discharge Summary  Admit Date: 03/13/2024 Discharge Date: 03/24/2024  Admitting Physician: Rosina Margretta Roca, MD Discharge Physician: Laurence Bouche, MD  Primary Care Provider: Paseda, [place], Phone [phone]  Discharge Destination: Home  Admission Diagnoses:  Chronic anemia [D64.9] Sickle cell pain crisis (CMS/HHS-HCC) [D57.00]  Discharge Diagnoses:  Principal Problem:   Sickle cell pain crisis (CMS/HHS-HCC) Resolved Problems:   * No resolved hospital problems. *    Anticipatory Guidance for Outpatient Care:   Pt is admitted and treated for sickle cell pain crisis. Found to have right basilic vein occlusive thrombus, started on Eliquis.     Results Pending at Discharge:  None   Follow-up/Care Transition Plan: hematology    Follow-up info: Paseda, [place] [address] [place], [place] [address] [place] [address] [phone]  Follow up       Allergies/Intolerances:  Allergies  Allergen Reactions   Cefotaxime Hives, Itching, Other (See Comments) and Rash    Hives  Hives, tolerated rocephin previously  Tolerated rocephin previously   Hydromorphone  Hives and Itching    Morphine  okay.  Tolerates morphine      New Adverse Drug Events: none  Medications:     Current Discharge Medication List     START taking these medications      Instructions  apixaban 5 mg tablet Quantity: 64 tablet Refills: 0 Start taking on: March 24, 2024  Commonly known as: ELIQUIS Take 2 tablets (10 mg total) by mouth every 12 (twelve) hours for 2 days, THEN 1 tablet (5 mg total) every 12 (twelve) hours for 28 days. Doctor's comments: Can substitute anticoagulant based on patient's insurance. This is new start of treatment for thrombus, already received 5 days Eliquis 10mg  bid in hospital. Last time this was given: 10 mg on March 24, 2024  9:23 AM       These medications have been CHANGED      Instructions  oxyCODONE  20 mg  immediate release tablet Quantity: 12 tablet Refills: 0 Stop taking on: March 27, 2024 What changed: reasons to take this  Commonly known as: OxyIR Take 1 tablet (20 mg total) by mouth every 6 (six) hours as needed for Pain for up to 3 days Last time this was given: 20 mg on March 24, 2024  9:31 AM       CONTINUE taking these medications      Instructions  ergocalciferol  (vitamin D2) 1,250 mcg (50,000 unit) capsule Refills: 0  Take 50,000 Units by mouth every 7 (seven) days   folic acid  400 MCG tablet Refills: 0  Commonly known as: FOLVITE  Take 800 mcg by mouth once daily Last time this was given: 800 mcg on March 24, 2024  9:22 AM   hydroxyurea  500 mg capsule Refills: 0  Commonly known as: HYDREA  Take 1,000 mg by mouth once daily Last time this was given: 1,000 mg on March 24, 2024  9:22 AM   ibuprofen  800 MG tablet Refills: 0  Commonly known as: MOTRIN  Take 800 mg by mouth every 8 (eight) hours as needed for Pain         Brief History of Present Illness: Paul Campos is a 34 y.o. male w/ a hx of sickle cell anemia who presented to the ED with leg pain now admitted with uncomplicated sickle cell pain crisis. History of Present Illness He is experiencing a sickle cell pain crisis that began approximately one week ago. Initially, the pain was localized to his leg and  was manageable at home with his prescribed pain medications, including 800 mg ibuprofen  and 20 mg oxycodone . Around the third or fourth day, the pain extended to his back, and he continued to manage it at home. However, on the day of the visit, the pain became unbearable, and his home medications were no longer effective.   No chest pain, fever, cough, or shortness of breath during this episode. He has a history of acute chest syndrome during his teenage years but has not experienced any recent complications from sickle cell disease. He notes that changes in weather can trigger his pain crises, and he  suspects this may be a factor in the current episode.   His current medication regimen includes hydroxyurea , folic acid , and vitamin D  50,000 units weekly. He takes hydroxyurea  and folic acid  every morning and last took his vitamin D  on Monday morning. He manages his medications independently at home.   He has no history of smoking, alcohol, or illicit drug use. He recently moved to [place].    Hospital Course by Problem:    # Sickle cell pain crisis - uncomplicated pain crisis likely due to weather change. No fever, cough or chest pain making acute chest unlikely. Hemoglobin stable  - pt initially preferred not to be on PCA and was improving, later he had increased pain in the back, so PCA started 1/23. Eventually improved - hydroxyurea , folic acid  - Pt's care is at [facility]. He has relocated to this area, referral to sickle cell clinic given.    # RUE occlusive thrombus of basilic vein - Acute onset RUE swelling 1/26, in the setting of infiltrated IV - RUE US  with occlusive thrombus in basilic vein (superficial vein) within 2cm of brachial vein - Per guidelines, SuVT located within 3 cm of a deep vein should be treated with therapeutic doses of anticoagulation. Hospitalist discussed with Hematology, agree high risk give proximity to deep vein, recommended x3 months treatment with DOAC   # Acute migraine headache - treated with Imitrex    # Smudge cells on smear - Incidentally identified on CBC differential - US  with incidentally noted cervically LAD; also with history of hilar, mediastinal LAD - Hospitalist discussed with Hematology, recommend monitoring for now, f/u with outpatient Hematologist to make note of findings and review once out of sickle cell crisis        Physical Exam: BP 117/70 (BP Location: Left upper arm, Patient Position: Lying)   Pulse 66   Temp 36.6 C (97.9 F) (Oral)   Resp 14   Ht 182.9 cm (6' 0.01)   Wt 80.4 kg (177 lb 4 oz)   SpO2 99%   BMI 24.03  kg/m  Constitutional: age appropriate, well developed, well appearing HEENT: [place]/AT, PERRL, OP-moist without lesion Neck: supple without lymphadenopathy Cardiovascular: Heart sounds regular rate and rhythm, S1 & S2 without S3 or S4 gallops, rubs, or murmurs. Radial pulses +2 bilaterally. Respiratory: Lungs clear to auscultation bilaterally. No rales, wheezes, or rhonchi. No respiratory distress. Abdomen: Soft, nontenderness, BS present. Extremities: No cyanosis, no edema.  Pertinent Lab Testing:  Recent Labs  Lab 03/18/24 0459 03/20/24 0627 03/23/24 0633  NA 137 135 139  K 4.1 3.9 3.6  CL 103 100 100  CO2 29 26 31*  BUN 10 6* 6*  CREATININE 0.9 0.7 0.9  GLUCOSE 101 126 104  CALCIUM 8.7 8.5* 8.8   Recent Labs  Lab 03/18/24 0459 03/19/24 0544 03/20/24 0627  AST 33 42* 40  ALT  18 25 26   ALKPHOS 53 59 55  TBILI 1.4 1.3 1.5    Recent Labs  Lab 03/22/24 0815 03/23/24 0633 03/24/24 0505  WBC 10.0* 9.6 11.8*  HGB 10.1* 8.8* 9.1*  HCT 28.1* 24.9* 25.4*  PLT 218 282 296   No results for input(s): APTT, INR in the last 168 hours.    X-ray chest PA and lateral Result Date: 03/20/2024 PA AND LATERAL CHEST RADIOGRAPHS CLINICAL HISTORY: Lung Aeration, D57.00 Hb-SS disease with crisis, unspecified (CMS/HHS-HCC) COMPARISON: Same-day radiography of heart at 10:09 AM FINDINGS: Patient positioning is reported upright. The cardiomediastinal silhouette is within normal limits for patient technique. Decreased conspicuity of previously described right basilar airspace opacity. Improved lung inflation. Crowding of the right infrahilar bronchovascular structures favors atelectasis. No pleural effusion or pneumothorax. Bones are unchanged. IMPRESSION:    Improved aeration of previously noted right basilar airspace opacity with increased lung inflation favors atelectasis. 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 The cardiomediastinal silhouette is stable. The lungs are hypoinflated bilaterally and there is crowding of the hilar structures centrally. Question right basilar airspace opacity partially obscuring the right hemidiaphragm. No large or enlarging pleural effusion or pneumothorax. IMPRESSION:    Hypoventilatory changes. Possible right lower lung airspace disease or pleural fluid. Electronically Signed by:  Beverley Norfolk, MD, [facility] Electronically Signed on:  03/20/2024 10:25 AM  US  extremity nonvascular right limited Result Date: 03/19/2024 US  extremity nonvascular right limited INDICATION: RUE swelling, M79.89 Other specified soft tissue disorders COMPARISON: Same-day right upper extremity venous Doppler study TECHNIQUE: Limited gray scale and color Doppler ultrasound of the area of clinical concern in the medial right forearm FINDINGS/IMPRESSION: Targeted ultrasound performed in the clinical area of concern medial right upper extremity. The right upper extremity demonstrates marked subcutaneous edema and skin thickening. No discrete fluid collection identified. Electronically Signed by:  Mercer Lehmann, MD, [facility] Electronically Signed on:  03/19/2024 1:24 PM  US  upper extremity venous right Result Date: 03/19/2024 EXAM: US  Duplex Upper Extremity Venous Right INDICATION: Eval DVT, M79.89 Other specified soft tissue disorders COMPARISON: CTA chest 12/13/2022 and 08/09/2021 TECHNIQUE: Gray-scale, color, and spectral Doppler ultrasound of the right internal jugular, subclavian, axillary, brachial, basilic and cephalic veins. FINDINGS: Right upper extremity deep veins:   Internal jugular: Patent.   Subclavian: Patent.   Axillary: Patent and compressible.    Brachial: Patent and compressible. Right upper extremity superficial veins:  Basilic: Acute, occlusive thrombus. Basilic vein is occluded within the proximal-distal portions. Thrombus visualized within 2 cm of the brachial vein.   Cephalic: Patent and compressible. Other: Incidentally visualized enlarged right level 4 lymph node measuring 2.4 x 1.0 x 0.8 cm. Additional prominent LEFT cervical lymph node is seen on imaging of the left internal jugular vein. Contralateral internal jugular and subclavian veins: Patent. IMPRESSION: 1.  No DVT within the right upper extremity. 2.  Occlusion of the right basilic vein, a superficial vein. 3.  Incidentally visualized cervical lymphadenopathy. Finding is nonspecific with broad differential considerations including reactive and neoplastic etiologies. Of note, patient has history of mediastinal and hilar lymphadenopathy seen on prior chest imaging dating back to 2023--possibly due to the same process. Recommend correlation with clinical history. Findings were communicated via Epic chat with [facility] DANG [facility]  by sonographer Edrick Anumihe at 03/19/2024 at 1256. Electronically Signed by:  Mercer Lehmann, MD, [facility] Electronically Signed on:  03/19/2024 1:12 PM    Code Status: Full Code   Status on Discharge:  Current activity: Walks frequently (03/24/24 0920) Current mobility: No limitation (03/24/24 0920)  Activity Recommendation: activity as tolerated  Other Discharge Instructions: Services setup at discharge: None Tubes/lines at discharge: None  Diet: Diet regular  Time spent on discharge process: 35 minutes    BONIFACIO DOOR, MD [facility]  03/24/2024   Hospital Contact Information:  [facility]) [facility]) [facility])  Pending tests:  Laboratory: [phone] Microbiology: [phone] Pathology: [phone] Radiology: [phone]  General questions: [phone]  Pending tests: Laboratory: [phone] Microbiology: [phone] Pathology: [phone] Radiology: [phone]  General questions:  [phone] Pending tests:  Laboratory: [phone] Microbiology: [phone] Pathology: [phone] Radiology: [phone]  General questions:  [phone] Pending tests: Laboratory: [phone] Microbiology: [phone] Pathology: [phone] Radiology: [phone]  General questions:  [phone]    Effective July 24, 2022, [facility], [facility], and/or [facility] refer to the [facility] campus of [facility].  "

## 2024-03-26 ENCOUNTER — Telehealth: Payer: Self-pay

## 2024-03-28 ENCOUNTER — Telehealth: Payer: Self-pay | Admitting: *Deleted

## 2024-03-28 NOTE — Transitions of Care (Post Inpatient/ED Visit) (Signed)
" ° °  03/28/2024  Name: Stony Stegmann MRN: [id number] DOB: [birth year]  Today's TOC FU Call Status: Today's TOC FU Call Status:: Unsuccessful Call (2nd Attempt) Unsuccessful Call (2nd Attempt) Date: 03/28/24  Attempted to reach the patient regarding the most recent Inpatient/ED visit.  Follow Up Plan: Additional outreach attempts will be made to reach the patient to complete the Transitions of Care (Post Inpatient/ED visit) call.    Olam Ku, RN, BSN, MSN [facility], [facility] RN Care Manager Direct Dial: [phone]  Fax: [phone]   "

## 2024-03-29 ENCOUNTER — Other Ambulatory Visit: Payer: Self-pay

## 2024-03-29 DIAGNOSIS — G8929 Other chronic pain: Secondary | ICD-10-CM

## 2024-03-29 DIAGNOSIS — D571 Sickle-cell disease without crisis: Secondary | ICD-10-CM

## 2024-03-29 NOTE — Telephone Encounter (Signed)
 Please Advise.  CB.

## 2024-03-30 MED ORDER — OXYCODONE HCL 20 MG PO TABS: 20.0000 mg | ORAL_TABLET | Freq: Four times a day (QID) | ORAL | 0 refills | Status: AC | PRN

## 2024-05-07 ENCOUNTER — Ambulatory Visit: Payer: Self-pay | Admitting: Nurse Practitioner
